# Patient Record
Sex: Male | Born: 1935
Health system: Southern US, Community
[De-identification: ages and names within clinical notes are randomized; demographics above are authoritative.]

## PROBLEM LIST (undated history)

## (undated) DIAGNOSIS — I10 Essential (primary) hypertension: Secondary | ICD-10-CM

## (undated) DIAGNOSIS — I4891 Unspecified atrial fibrillation: Secondary | ICD-10-CM

## (undated) DIAGNOSIS — Z950 Presence of cardiac pacemaker: Secondary | ICD-10-CM

## (undated) DIAGNOSIS — K635 Polyp of colon: Secondary | ICD-10-CM

## (undated) DIAGNOSIS — F419 Anxiety disorder, unspecified: Secondary | ICD-10-CM

## (undated) DIAGNOSIS — F329 Major depressive disorder, single episode, unspecified: Secondary | ICD-10-CM

## (undated) DIAGNOSIS — M1990 Unspecified osteoarthritis, unspecified site: Secondary | ICD-10-CM

## (undated) DIAGNOSIS — Z8719 Personal history of other diseases of the digestive system: Secondary | ICD-10-CM

## (undated) DIAGNOSIS — K2970 Gastritis, unspecified, without bleeding: Secondary | ICD-10-CM

## (undated) DIAGNOSIS — K625 Hemorrhage of anus and rectum: Secondary | ICD-10-CM

## (undated) DIAGNOSIS — D510 Vitamin B12 deficiency anemia due to intrinsic factor deficiency: Secondary | ICD-10-CM

## (undated) DIAGNOSIS — C4491 Basal cell carcinoma of skin, unspecified: Secondary | ICD-10-CM

## (undated) DIAGNOSIS — H269 Unspecified cataract: Secondary | ICD-10-CM

## (undated) DIAGNOSIS — F32A Depression, unspecified: Secondary | ICD-10-CM

## (undated) DIAGNOSIS — Z87442 Personal history of urinary calculi: Secondary | ICD-10-CM

## (undated) DIAGNOSIS — Z87438 Personal history of other diseases of male genital organs: Secondary | ICD-10-CM

## (undated) DIAGNOSIS — Z9289 Personal history of other medical treatment: Secondary | ICD-10-CM

## (undated) DIAGNOSIS — K219 Gastro-esophageal reflux disease without esophagitis: Secondary | ICD-10-CM

## (undated) DIAGNOSIS — I340 Nonrheumatic mitral (valve) insufficiency: Secondary | ICD-10-CM

## (undated) DIAGNOSIS — K5790 Diverticulosis of intestine, part unspecified, without perforation or abscess without bleeding: Secondary | ICD-10-CM

## (undated) DIAGNOSIS — I493 Ventricular premature depolarization: Secondary | ICD-10-CM

## (undated) DIAGNOSIS — I5032 Chronic diastolic (congestive) heart failure: Secondary | ICD-10-CM

## (undated) HISTORY — PX: EYE SURGERY: SHX253

## (undated) HISTORY — DX: Gastritis, unspecified, without bleeding: K29.70

## (undated) HISTORY — DX: Polyp of colon: K63.5

## (undated) HISTORY — DX: Personal history of other diseases of the digestive system: Z87.19

## (undated) HISTORY — DX: Basal cell carcinoma of skin, unspecified: C44.91

## (undated) HISTORY — DX: Diverticulosis of intestine, part unspecified, without perforation or abscess without bleeding: K57.90

## (undated) HISTORY — PX: INGUINAL HERNIA REPAIR: SUR1180

## (undated) HISTORY — DX: Ventricular premature depolarization: I49.3

## (undated) HISTORY — DX: Vitamin B12 deficiency anemia due to intrinsic factor deficiency: D51.0

## (undated) HISTORY — DX: Chronic diastolic (congestive) heart failure: I50.32

## (undated) HISTORY — DX: Essential (primary) hypertension: I10

## (undated) HISTORY — DX: Personal history of other diseases of male genital organs: Z87.438

---

## 1999-03-04 ENCOUNTER — Ambulatory Visit (HOSPITAL_COMMUNITY): Admission: RE | Admit: 1999-03-04 | Discharge: 1999-03-04 | Payer: Self-pay | Admitting: *Deleted

## 1999-03-09 ENCOUNTER — Ambulatory Visit (HOSPITAL_COMMUNITY): Admission: RE | Admit: 1999-03-09 | Discharge: 1999-03-09 | Payer: Self-pay | Admitting: *Deleted

## 1999-05-27 ENCOUNTER — Ambulatory Visit (HOSPITAL_COMMUNITY): Admission: RE | Admit: 1999-05-27 | Discharge: 1999-05-27 | Payer: Self-pay | Admitting: *Deleted

## 2000-03-28 ENCOUNTER — Ambulatory Visit (HOSPITAL_COMMUNITY): Admission: RE | Admit: 2000-03-28 | Discharge: 2000-03-28 | Payer: Self-pay | Admitting: *Deleted

## 2000-03-28 ENCOUNTER — Encounter: Payer: Self-pay | Admitting: *Deleted

## 2000-06-06 ENCOUNTER — Ambulatory Visit (HOSPITAL_COMMUNITY): Admission: RE | Admit: 2000-06-06 | Discharge: 2000-06-06 | Payer: Self-pay | Admitting: *Deleted

## 2000-11-24 ENCOUNTER — Ambulatory Visit (HOSPITAL_COMMUNITY): Admission: RE | Admit: 2000-11-24 | Discharge: 2000-11-24 | Payer: Self-pay | Admitting: *Deleted

## 2001-09-20 ENCOUNTER — Encounter: Admission: RE | Admit: 2001-09-20 | Discharge: 2001-09-20 | Payer: Self-pay | Admitting: General Surgery

## 2001-09-20 ENCOUNTER — Encounter: Payer: Self-pay | Admitting: General Surgery

## 2001-09-24 ENCOUNTER — Ambulatory Visit (HOSPITAL_BASED_OUTPATIENT_CLINIC_OR_DEPARTMENT_OTHER): Admission: RE | Admit: 2001-09-24 | Discharge: 2001-09-24 | Payer: Self-pay | Admitting: General Surgery

## 2010-11-16 LAB — HM COLONOSCOPY

## 2011-02-18 ENCOUNTER — Encounter: Payer: Self-pay | Admitting: Family Medicine

## 2011-02-18 ENCOUNTER — Emergency Department (HOSPITAL_COMMUNITY)
Admission: EM | Admit: 2011-02-18 | Discharge: 2011-02-19 | Disposition: A | Discharge status: Home / Self Care | Payer: Medicare Other | Attending: Emergency Medicine | Admitting: Emergency Medicine

## 2011-02-18 DIAGNOSIS — K625 Hemorrhage of anus and rectum: Secondary | ICD-10-CM | POA: Insufficient documentation

## 2011-02-18 DIAGNOSIS — R42 Dizziness and giddiness: Secondary | ICD-10-CM | POA: Insufficient documentation

## 2011-02-18 DIAGNOSIS — Z7982 Long term (current) use of aspirin: Secondary | ICD-10-CM | POA: Insufficient documentation

## 2011-02-18 DIAGNOSIS — I10 Essential (primary) hypertension: Secondary | ICD-10-CM | POA: Insufficient documentation

## 2011-02-18 DIAGNOSIS — Z79899 Other long term (current) drug therapy: Secondary | ICD-10-CM | POA: Insufficient documentation

## 2011-02-18 DIAGNOSIS — D62 Acute posthemorrhagic anemia: Secondary | ICD-10-CM | POA: Insufficient documentation

## 2011-02-18 LAB — CBC
HCT: 30.8 % — ABNORMAL LOW (ref 39.0–52.0)
Hemoglobin: 10.3 g/dL — ABNORMAL LOW (ref 13.0–17.0)
MCHC: 33.4 g/dL (ref 30.0–36.0)
MCV: 79.6 fL (ref 78.0–100.0)
RDW: 14.9 % (ref 11.5–15.5)
WBC: 11.4 10*3/uL — ABNORMAL HIGH (ref 4.0–10.5)

## 2011-02-18 LAB — BASIC METABOLIC PANEL
BUN: 17 mg/dL (ref 6–23)
Chloride: 102 mEq/L (ref 96–112)
Creatinine, Ser: 1.04 mg/dL (ref 0.50–1.35)
GFR calc Af Amer: >60 mL/min (ref >60)
Glucose, Bld: 151 mg/dL — ABNORMAL HIGH (ref 70–99)
Potassium: 3.5 mEq/L (ref 3.5–5.1)

## 2011-02-18 LAB — DIFFERENTIAL
Basophils Absolute: 0 10*3/uL (ref 0.0–0.1)
Basophils Relative: 0 % (ref 0–1)
Eosinophils Absolute: 0 10*3/uL (ref 0.0–0.7)
Eosinophils Relative: 0 % (ref 0–5)
Lymphocytes Relative: 10 % — ABNORMAL LOW (ref 12–46)
Lymphs Abs: 1.1 10*3/uL (ref 0.7–4.0)
Monocytes Absolute: 0.6 10*3/uL (ref 0.1–1.0)
Monocytes Relative: 5 % (ref 3–12)
Neutro Abs: 9.7 10*3/uL — ABNORMAL HIGH (ref 1.7–7.7)
Neutrophils Relative %: 85 % — ABNORMAL HIGH (ref 43–77)

## 2011-08-23 ENCOUNTER — Ambulatory Visit (INDEPENDENT_AMBULATORY_CARE_PROVIDER_SITE_OTHER): Payer: Medicare Other | Admitting: Ophthalmology

## 2011-09-20 ENCOUNTER — Ambulatory Visit (INDEPENDENT_AMBULATORY_CARE_PROVIDER_SITE_OTHER): Payer: Medicare Other | Admitting: Ophthalmology

## 2011-09-20 DIAGNOSIS — H3530 Unspecified macular degeneration: Secondary | ICD-10-CM

## 2011-09-20 DIAGNOSIS — H35039 Hypertensive retinopathy, unspecified eye: Secondary | ICD-10-CM

## 2011-09-20 DIAGNOSIS — I10 Essential (primary) hypertension: Secondary | ICD-10-CM

## 2011-09-20 DIAGNOSIS — H43819 Vitreous degeneration, unspecified eye: Secondary | ICD-10-CM

## 2012-02-15 ENCOUNTER — Inpatient Hospital Stay (HOSPITAL_COMMUNITY)
Admission: EM | Admit: 2012-02-15 | Discharge: 2012-02-18 | DRG: 811 | Disposition: A | Discharge status: Home / Self Care | Payer: Medicare Other | Attending: Internal Medicine | Admitting: Internal Medicine

## 2012-02-15 ENCOUNTER — Encounter (HOSPITAL_COMMUNITY): Payer: Self-pay | Admitting: *Deleted

## 2012-02-15 DIAGNOSIS — N400 Enlarged prostate without lower urinary tract symptoms: Secondary | ICD-10-CM | POA: Diagnosis present

## 2012-02-15 DIAGNOSIS — K5790 Diverticulosis of intestine, part unspecified, without perforation or abscess without bleeding: Secondary | ICD-10-CM | POA: Diagnosis present

## 2012-02-15 DIAGNOSIS — Z7982 Long term (current) use of aspirin: Secondary | ICD-10-CM

## 2012-02-15 DIAGNOSIS — K5730 Diverticulosis of large intestine without perforation or abscess without bleeding: Secondary | ICD-10-CM

## 2012-02-15 DIAGNOSIS — K5731 Diverticulosis of large intestine without perforation or abscess with bleeding: Secondary | ICD-10-CM | POA: Diagnosis present

## 2012-02-15 DIAGNOSIS — Z87438 Personal history of other diseases of male genital organs: Secondary | ICD-10-CM

## 2012-02-15 DIAGNOSIS — H547 Unspecified visual loss: Secondary | ICD-10-CM | POA: Diagnosis present

## 2012-02-15 DIAGNOSIS — K922 Gastrointestinal hemorrhage, unspecified: Secondary | ICD-10-CM | POA: Diagnosis present

## 2012-02-15 DIAGNOSIS — K635 Polyp of colon: Secondary | ICD-10-CM | POA: Diagnosis present

## 2012-02-15 DIAGNOSIS — I959 Hypotension, unspecified: Secondary | ICD-10-CM | POA: Diagnosis present

## 2012-02-15 DIAGNOSIS — K5900 Constipation, unspecified: Secondary | ICD-10-CM | POA: Diagnosis present

## 2012-02-15 DIAGNOSIS — Z85828 Personal history of other malignant neoplasm of skin: Secondary | ICD-10-CM

## 2012-02-15 DIAGNOSIS — I10 Essential (primary) hypertension: Secondary | ICD-10-CM | POA: Diagnosis present

## 2012-02-15 DIAGNOSIS — M1710 Unilateral primary osteoarthritis, unspecified knee: Secondary | ICD-10-CM | POA: Diagnosis present

## 2012-02-15 DIAGNOSIS — Z860100 Personal history of colon polyps, unspecified: Secondary | ICD-10-CM

## 2012-02-15 DIAGNOSIS — D510 Vitamin B12 deficiency anemia due to intrinsic factor deficiency: Secondary | ICD-10-CM | POA: Diagnosis present

## 2012-02-15 DIAGNOSIS — E559 Vitamin D deficiency, unspecified: Secondary | ICD-10-CM | POA: Diagnosis present

## 2012-02-15 DIAGNOSIS — D62 Acute posthemorrhagic anemia: Principal | ICD-10-CM | POA: Diagnosis present

## 2012-02-15 DIAGNOSIS — Z8719 Personal history of other diseases of the digestive system: Secondary | ICD-10-CM

## 2012-02-15 DIAGNOSIS — K625 Hemorrhage of anus and rectum: Secondary | ICD-10-CM

## 2012-02-15 HISTORY — DX: Hemorrhage of anus and rectum: K62.5

## 2012-02-15 HISTORY — DX: Unspecified osteoarthritis, unspecified site: M19.90

## 2012-02-15 LAB — CBC
HCT: 38.7 % — ABNORMAL LOW (ref 39.0–52.0)
HCT: 31.4 % — ABNORMAL LOW (ref 39.0–52.0)
Hemoglobin: 13.1 g/dL (ref 13.0–17.0)
MCH: 28.4 pg (ref 26.0–34.0)
MCHC: 33.9 g/dL (ref 30.0–36.0)
MCHC: 33.4 g/dL (ref 30.0–36.0)
MCV: 84.5 fL (ref 78.0–100.0)
Platelets: 130 10*3/uL — ABNORMAL LOW (ref 150–400)
RDW: 14 % (ref 11.5–15.5)
RDW: 14.1 % (ref 11.5–15.5)
RDW: 14.1 % (ref 11.5–15.5)
WBC: 7.5 10*3/uL (ref 4.0–10.5)

## 2012-02-15 LAB — POCT I-STAT, CHEM 8
Calcium, Ion: 1.26 mmol/L (ref 1.13–1.30)
Chloride: 101 mEq/L (ref 96–112)
Glucose, Bld: 132 mg/dL — ABNORMAL HIGH (ref 70–99)
HCT: 40 % (ref 39.0–52.0)
TCO2: 23 mmol/L (ref 0–100)

## 2012-02-15 LAB — PROTIME-INR: Prothrombin Time: 14.7 seconds (ref 11.6–15.2)

## 2012-02-15 MED ORDER — ACETAMINOPHEN 650 MG RE SUPP: 650.0000 mg | Freq: Four times a day (QID) | RECTAL | Status: DC | PRN

## 2012-02-15 MED ORDER — AMLODIPINE BESYLATE 10 MG PO TABS
10.0000 mg | ORAL_TABLET | Freq: Every day | ORAL | Status: DC
Start: 2012-02-16 — End: 2012-02-16
  Filled 2012-02-15: qty 1

## 2012-02-15 MED ORDER — SODIUM CHLORIDE 0.9 % IJ SOLN
3.0000 mL | Freq: Two times a day (BID) | INTRAMUSCULAR | Status: DC
  Administered 2012-02-15 – 2012-02-18 (×2): 3 mL via INTRAVENOUS

## 2012-02-15 MED ORDER — ACETAMINOPHEN 325 MG PO TABS
650.0000 mg | ORAL_TABLET | Freq: Four times a day (QID) | ORAL | Status: DC | PRN
  Administered 2012-02-16: 650 mg via ORAL
  Filled 2012-02-15: qty 2

## 2012-02-15 MED ORDER — ONDANSETRON HCL 4 MG/2ML IJ SOLN: 4.0000 mg | Freq: Four times a day (QID) | INTRAMUSCULAR | Status: DC | PRN

## 2012-02-15 MED ORDER — ALUM & MAG HYDROXIDE-SIMETH 200-200-20 MG/5ML PO SUSP: 30.0000 mL | Freq: Four times a day (QID) | ORAL | Status: DC | PRN

## 2012-02-15 MED ORDER — DOXAZOSIN MESYLATE 4 MG PO TABS
4.0000 mg | ORAL_TABLET | Freq: Every day | ORAL | Status: DC
  Administered 2012-02-15 – 2012-02-17 (×3): 4 mg via ORAL
  Filled 2012-02-15 (×5): qty 1

## 2012-02-15 MED ORDER — SODIUM CHLORIDE 0.9 % IV BOLUS (SEPSIS)
500.0000 mL | Freq: Once | INTRAVENOUS | Status: AC
  Administered 2012-02-15: 16:00:00 via INTRAVENOUS

## 2012-02-15 MED ORDER — ONDANSETRON HCL 4 MG PO TABS: 4.0000 mg | ORAL_TABLET | Freq: Four times a day (QID) | ORAL | Status: DC | PRN

## 2012-02-15 MED ORDER — SODIUM CHLORIDE 0.9 % IV SOLN
INTRAVENOUS | Status: DC
  Administered 2012-02-15 – 2012-02-16 (×3): via INTRAVENOUS

## 2012-02-15 MED ORDER — ONDANSETRON HCL 4 MG/2ML IJ SOLN: 4.0000 mg | Freq: Three times a day (TID) | INTRAMUSCULAR | Status: DC | PRN

## 2012-02-15 MED ORDER — SODIUM CHLORIDE 0.9 % IV BOLUS (SEPSIS)
500.0000 mL | Freq: Once | INTRAVENOUS | Status: AC
  Administered 2012-02-15: 500 mL via INTRAVENOUS

## 2012-02-15 NOTE — ED Notes (Signed)
Pt had 1 episode bloody stool. Reports was dark blood

## 2012-02-15 NOTE — ED Provider Notes (Signed)
History     CSN: [id number]  Arrival date & time 02/15/12  4098   First MD Initiated Contact with Patient 02/15/12 1044      Chief Complaint  Patient presents with  . Rectal Bleeding    (Consider location/radiation/quality/duration/timing/severity/associated sxs/prior treatment) HPI Comments: Austin Gray. Presents with  Bright red blood in his stool multiple times since he woke today at 6 am.  He denies pain,  Nausea, vomiting,  Acid reflux or history of stomach ulcer/gastritis or GERD.  He does report having a history of colon polyps,  Last colonoscopy was last summer and believes he has internal hemorrhoids as well.  He denies weakness or dizziness,  No fatigue at this time.  He has had approximately 6 bloody stools with a small amount of semi formed stool,  The last occuring just after arrival here.  He is on no blood thinners but does take an aspirin 81 mg daily.  Patient is a 78 y.o. male presenting with hematochezia. The history is provided by the patient.  Rectal Bleeding  Pertinent negatives include no fever, no abdominal pain, no nausea, no rectal pain, no vomiting, no chest pain, no headaches and no rash.    Past Medical History  Diagnosis Date  . Hypertension   . Pernicious anemia   . Inguinal hernia   . History of BPH   . Visual impairment   . Hemorrhoids   . Diverticulosis   . Gastritis   . Basal cell carcinoma   . Vitamin d deficiency   . Colon polyps     Past Surgical History  Procedure Date  . Hernia repair     History reviewed. No pertinent family history.  History  Substance Use Topics  . Smoking status: Not on file  . Smokeless tobacco: Not on file  . Alcohol Use: Not on file      Review of Systems  Constitutional: Negative for fever and fatigue.  HENT: Negative for congestion, sore throat and neck pain.   Eyes: Negative.   Respiratory: Negative for chest tightness and shortness of breath.   Cardiovascular: Negative for chest pain.   Gastrointestinal: Positive for blood in stool and hematochezia. Negative for nausea, vomiting, abdominal pain, abdominal distention and rectal pain.  Genitourinary: Negative.   Musculoskeletal: Negative for joint swelling and arthralgias.  Skin: Negative.  Negative for rash and wound.  Neurological: Negative for dizziness, weakness, light-headedness, numbness and headaches.  Hematological: Negative.   Psychiatric/Behavioral: Negative.     Allergies  Review of patient's allergies indicates no known allergies.  Home Medications   Current Outpatient Rx  Name Route Sig Dispense Refill  . AMLODIPINE BESYLATE 10 MG PO TABS Oral Take 10 mg by mouth daily.      . ASPIRIN 81 MG PO TABS Oral Take 81 mg by mouth daily.      Marland Kitchen BENAZEPRIL HCL 40 MG PO TABS Oral Take 40 mg by mouth daily.      Marland Kitchen DOXAZOSIN MESYLATE 4 MG PO TABS Oral Take 4 mg by mouth at bedtime.    Marland Kitchen HYDROCHLOROTHIAZIDE 25 MG PO TABS Oral Take 25 mg by mouth daily.      . ADULT MULTIVITAMIN W/MINERALS CH Oral Take 1 tablet by mouth daily.    . OCUVITE-LUTEIN PO CAPS Oral Take 1 capsule by mouth daily.      BP 137/63  Pulse 64  Temp 97.2 F (36.2 C) (Oral)  Resp 16  SpO2 97%  Physical Exam  Nursing note and vitals reviewed. Constitutional: He appears well-developed and well-nourished.  HENT:  Head: Normocephalic and atraumatic.  Eyes: Conjunctivae are normal.  Neck: Normal range of motion.  Cardiovascular: Normal rate, regular rhythm, normal heart sounds and intact distal pulses.   Pulmonary/Chest: Effort normal and breath sounds normal. He has no wheezes.  Abdominal: Soft. Bowel sounds are normal. He exhibits no distension. There is no hepatosplenomegaly. There is no tenderness. There is no rebound, no guarding and no CVA tenderness.  Genitourinary: Rectal exam shows no mass and no tenderness.       No appreciable hemorrhoid,  Hemoccult negative.  Musculoskeletal: Normal range of motion.  Neurological: He is alert.    Skin: Skin is warm and dry.  Psychiatric: He has a normal mood and affect.    ED Course  Procedures (including critical care time)  Labs Reviewed  CBC - Abnormal; Notable for the following:    WBC 12.1 (*)     HCT 38.7 (*)     All other components within normal limits  POCT I-STAT, CHEM 8 - Abnormal; Notable for the following:    Glucose, Bld 132 (*)     All other components within normal limits  TYPE AND SCREEN  ABO/RH   Pt is grossly hemoccult positive  1. GI bleeding       MDM  Spoke with Algis Downs with [facility].  Accepts pt.  With temp admit orders to Team 10.  Pt stable at this time.    Spoke with Trixie Dredge, PA-C - will accept pt in cdu pending bed placement.        Burgess Amor, PA 02/15/12 1347  Burgess Amor, PA 02/15/12 1358

## 2012-02-15 NOTE — ED Notes (Signed)
C/o rectal bleeding onset this am, 5 episodes. Reports bright red blood. Denies pain. States stool with each episode.  No voiced complaints presently.

## 2012-02-15 NOTE — ED Provider Notes (Signed)
Medical screening examination/treatment/procedure(s) were conducted as a shared visit with non-physician practitioner(s) and myself.  I personally evaluated the patient during the encounter 1:19 PM  Elderly man with bright red rectal bleeding, mild LLQ tenderness on exam, and mild anemia on lab tests. Recommend admission and GI Consult.       Carleene Cooper III, MD 02/15/12 2033

## 2012-02-15 NOTE — ED Notes (Signed)
Admitting MD at bedside.

## 2012-02-15 NOTE — ED Notes (Signed)
Pt. oob to the bathroom, gait steady.  

## 2012-02-15 NOTE — ED Notes (Signed)
To ED for eval of rectal bleeding since this am. Pt states each time he uses the bathroom there is bright red blood in the toilet. Skin w/d, resp e/u. Appears in nad. No abd pain

## 2012-02-15 NOTE — H&P (Signed)
I have personally seen and examined [name] I agree with a/p and PE as per M. York, PA note Interval history Patient had a large bloody BMs - with BP drop and dizziness - vagal vs volume depletion . Plan to admit his to stepdown and transfuse one unit of PRBCs Bolus of 500cc in the ED corrected the hypotension. Lonia Blood [id number]

## 2012-02-15 NOTE — H&P (Signed)
[facility] History and Physical  Austin Gray. UJW:[id number] DOB: [birth year] DOA: 02/15/2012  Referring physician: Burgess Amor, PA PCP: Leo Grosser, MD  Gastroenterologist:  Dr. Jeani Hawking  Chief Complaint: BRBPR HPI:  78 year old Caucasian male with history of colon polyps and diverticulosis presents to the ED with multiple episodes of bright red blood per rectum that started this morning.  Including the most recent episode in the emergency department, [name] has had 7 episodes of bright red blood per rectum today.  He reports mild constipation since last August for which he drinks apple juice or prune juice each morning. He denies any abdominal pain and reports that his appetite has been good. He takes an 81 mg aspirin daily but denies any NSAID use other than that. His last colonoscopy was approximately one year ago. Dr. Elnoria Howard observed diverticulosis and removed 2 polyps.  He was sent to [facility] for a special procedure to remove the third polyp. After the third polyp was removed he did have some bright red blood per rectum and anemia, but it resolved over the course of the next couple of months.    Review of Systems:  Pertinent positives include:  Mild constipation, +81 mg ASA  Pertinent negatives include:  Chest pain, recent illness, difficulty breathing, hematuria, easy bruising, anorexia, decreased level of energy.  All other systems reviewed and found to be negative.   Past Medical History  Diagnosis Date  . Hypertension   . Pernicious anemia   . Inguinal hernia   . History of BPH   . Visual impairment   . Hemorrhoids   . Diverticulosis   . Gastritis   . Basal cell carcinoma   . Vitamin d deficiency   . Colon polyps    Past Surgical History  Procedure Date  . Hernia repair    Social History:  does not have a smoking history on file. He does not have any smokeless tobacco history on file. His alcohol and drug histories not on file.  No Known  Allergies  History reviewed. No pertinent family history.   Prior to Admission medications   Medication Sig Start Date End Date Taking? Authorizing Provider  amLODipine (NORVASC) 10 MG tablet Take 10 mg by mouth daily.     Yes Historical Provider, MD  aspirin 81 MG tablet Take 81 mg by mouth daily.     Yes Historical Provider, MD  benazepril (LOTENSIN) 40 MG tablet Take 40 mg by mouth daily.     Yes Historical Provider, MD  doxazosin (CARDURA) 4 MG tablet Take 4 mg by mouth at bedtime.   Yes Historical Provider, MD  hydrochlorothiazide 25 MG tablet Take 25 mg by mouth daily.     Yes Historical Provider, MD  Multiple Vitamin (MULTIVITAMIN WITH MINERALS) TABS Take 1 tablet by mouth daily.   Yes Historical Provider, MD  multivitamin-lutein (OCUVITE-LUTEIN) CAPS Take 1 capsule by mouth daily.   Yes Historical Provider, MD   Physical Exam: Filed Vitals:   02/15/12 1140 02/15/12 1230 02/15/12 1315 02/15/12 1450  BP: 97/60 116/53 137/63 111/52  Pulse: 84 79 64 63  Temp:    98.7 F (37.1 C)  TempSrc:    Oral  Resp: 15 16 16 28   SpO2: 97% 97% 97% 97%     General:  No Acute Distress, Awake, Non toxic  Eyes: Pupils equal, round and reactive to light  ENT: Oropharynx shows no edema or exudates, nose with out discharge.  Poor dentition  Neck:  Supple  Cardiovascular:  Regular rate and rhythm, no murmurs, rubs or gallops, no lower extremity edema  Respiratory: Clear to auscultation, no accessory muscle movment  Abdomen: Soft, minimally tender to palpation in the LLQ, non distended, positive bowel sounds, no obvious masses palpated.  Skin: no rashes, bruises, or lesions  Psychiatric: Alert and Orientated, Cooperative, moderately groomed, halitosis  Neurologic: Cranial Nerves 2-12 are grossly in tact.  Exam non focal.  Labs on Admission:  Basic Metabolic Panel:  Lab 02/15/12 9604  NA 138  K 4.1  CL 101  CO2 --  GLUCOSE 132*  BUN 15  CREATININE 1.10  CALCIUM --  MG --  PHOS  --   CBC:  Lab 02/15/12 1057 02/15/12 1047  WBC -- 12.1*  NEUTROABS -- --  HGB 13.6 13.1  HCT 40.0 38.7*  MCV -- 83.8  PLT -- 156    Radiological Exams on Admission: No results found.  EKG: pending  Assessment/Plan Principal Problem:  *GI bleed Active Problems:  Hypertension  Pernicious anemia  History of BPH  Diverticulosis   Lower Gastrointestinal Bleeding.  Likely diverticular in nature.  Patient is currently stable.  Will check CBC q 8 hours and monitor in a tele bed.  Hold 81 mg ASA.  Type and screen completed.  Clear liquids with 2 doses of miralax.  Dr. Elnoria Howard has been consulted.  Hypertension.  BP currently stable.  Will continue norvasc and cardura, but hold HCTZ, benazepril.  Monitor.  Concerned for hypotension if he continues bleeding.  History of BPH.  Continue Cardura.  Vision impairment.     Code Status:  Full Code (per patient "try once") Family Communication: Spoke directly with patient, no family present. Disposition Plan: Patient from home.  Lives alone and cares for his 2 grand daughters who are 66, 65, 2 yo.  Plan to return home when medically appropriate.  Algis Downs, PA-C [facility] Pager: [phone]   If 7PM-7AM, please contact night-coverage [email or web address] Password [facility] 02/15/2012, 2:58 PM

## 2012-02-15 NOTE — ED Notes (Signed)
Reported hypotension to [facility].   Orders received to give a bolus of 500ccs of NACL.  Admitting MD will be down to see pt.

## 2012-02-15 NOTE — Consult Note (Signed)
Reason for Consult: Hematochezia Referring Physician: [facility]  Shamel E Dane. HPI: This is a 78 year old gentleman with a PMH of colonic polyps and diverticula who was admitted for hematochezia.  The bleeding started early this morning and as a result he presented to the ER.  His HGB was initially noted to be normal, but with the persistent hematochezia his HGB dropped down into the 10 range.  No complaints of abdominal pain.  His blood pressure was noted to drop and he was then admitted to the step down unit.  Past Medical History  Diagnosis Date  . Hypertension   . History of BPH   . Visual impairment   . Hemorrhoids   . Diverticulosis   . Gastritis   . Basal cell carcinoma   . Vitamin d deficiency   . Colon polyps   . Pernicious anemia   . Exertional dyspnea     "whenever I'm mowing or something"  . Rectal bleeding 02/15/12  . Arthritis     "little bit; left knee"    Past Surgical History  Procedure Date  . Inguinal hernia repair 1958; ~ 2002    left; right    History reviewed. No pertinent family history.  Social History:  reports that he has never smoked. His smokeless tobacco use includes Snuff. He reports that he does not drink alcohol or use illicit drugs.  Allergies: No Known Allergies  Medications:  Scheduled:   . amLODipine  10 mg Oral Daily  . doxazosin  4 mg Oral QHS  . sodium chloride  500 mL Intravenous Once  . sodium chloride  500 mL Intravenous Once  . sodium chloride  3 mL Intravenous Q12H   Continuous:   . sodium chloride 100 mL/hr at 02/15/12 1558    Results for orders placed during the hospital encounter of 02/15/12 (from the past 24 hour(s))  CBC     Status: Abnormal   Collection Time   02/15/12 10:47 AM      Component Value Range   WBC 12.1 (*) 4.0 - 10.5 K/uL   RBC 4.62  4.22 - 5.81 MIL/uL   Hemoglobin 13.1  13.0 - 17.0 g/dL   HCT 16.1 (*) 09.6 - 04.5 %   MCV 83.8  78.0 - 100.0 fL   MCH 28.4  26.0 - 34.0 pg   MCHC  33.9  30.0 - 36.0 g/dL   RDW 40.9  81.1 - 91.4 %   Platelets 156  150 - 400 K/uL  POCT I-STAT, CHEM 8     Status: Abnormal   Collection Time   02/15/12 10:57 AM      Component Value Range   Sodium 138  135 - 145 mEq/L   Potassium 4.1  3.5 - 5.1 mEq/L   Chloride 101  96 - 112 mEq/L   BUN 15  6 - 23 mg/dL   Creatinine, Ser 7.82  0.50 - 1.35 mg/dL   Glucose, Bld 956 (*) 70 - 99 mg/dL   Calcium, Ion 2.13  0.86 - 1.30 mmol/L   TCO2 23  0 - 100 mmol/L   Hemoglobin 13.6  13.0 - 17.0 g/dL   HCT 57.8  46.9 - 62.9 %  TYPE AND SCREEN     Status: Normal (Preliminary result)   Collection Time   02/15/12 11:35 AM      Component Value Range   ABO/RH(D) O POS     Antibody Screen NEG     Sample Expiration 02/18/2012  Unit Number [id number]     Blood Component Type RED CELLS,LR     Unit division 00     Status of Unit ISSUED     Transfusion Status OK TO TRANSFUSE     Crossmatch Result Compatible    ABO/RH     Status: Normal   Collection Time   02/15/12 11:35 AM      Component Value Range   ABO/RH(D) O POS    CBC     Status: Abnormal   Collection Time   02/15/12  2:53 PM      Component Value Range   WBC 10.2  4.0 - 10.5 K/uL   RBC 3.74 (*) 4.22 - 5.81 MIL/uL   Hemoglobin 10.5 (*) 13.0 - 17.0 g/dL   HCT 82.9 (*) 56.2 - 13.0 %   MCV 84.0  78.0 - 100.0 fL   MCH 28.1  26.0 - 34.0 pg   MCHC 33.4  30.0 - 36.0 g/dL   RDW 86.5  78.4 - 69.6 %   Platelets 142 (*) 150 - 400 K/uL  PREPARE RBC (CROSSMATCH)     Status: Normal   Collection Time   02/15/12  4:15 PM      Component Value Range   Order Confirmation ORDER PROCESSED BY BLOOD BANK       No results found.  ROS:  As stated above in the HPI otherwise negative.  Blood pressure 128/51, pulse 62, temperature 97.9 F (36.6 C), temperature source Oral, resp. rate 26, SpO2 100.00%.    PE: Gen: NAD, Alert and Oriented HEENT:  [place]/AT, EOMI Neck: Supple, no LAD Lungs: CTA Bilaterally CV: RRR without M/G/R ABM: Soft, NTND, +BS Ext: No  C/C/E  Assessment/Plan: 1) Diverticular bleed. 2) Anemia   The patient's clinical presentation is consistent with a diverticular bleed.  If I recall correctly his diverticula is left-sided.  Currently he is stable, but I agree with the close monitoring.  He had a colonoscopy as recently as last year and he did require further polypectomy at [facility] for a flat polyp.  Plan: 1) Monitor HGB. 2) Transfuse as necessary. 3) If bleeding continues a surgical consultation will be warranted.  At that point it will also be prudent to order a bleeding scan.  For right now I think we can monitor his clinical status.  Teighlor Korson D 02/15/2012, 5:55 PM

## 2012-02-15 NOTE — ED Notes (Signed)
Dr. Lavera Guise at the bedside. Pt. Received 500 cc bolus of Nacl.  Pt. Drinking clear liquid diet.

## 2012-02-15 NOTE — Progress Notes (Signed)
1:19 PM Elderly man with bright red rectal bleeding, mild LLQ tenderness on exam, and mild anemia on lab tests.  Recommend admission and GI Consult.

## 2012-02-16 ENCOUNTER — Inpatient Hospital Stay (HOSPITAL_COMMUNITY): Payer: Medicare Other

## 2012-02-16 DIAGNOSIS — I10 Essential (primary) hypertension: Secondary | ICD-10-CM

## 2012-02-16 DIAGNOSIS — D62 Acute posthemorrhagic anemia: Principal | ICD-10-CM

## 2012-02-16 LAB — BASIC METABOLIC PANEL
Calcium: 8.4 mg/dL (ref 8.4–10.5)
Chloride: 105 mEq/L (ref 96–112)
Creatinine, Ser: 0.96 mg/dL (ref 0.50–1.35)
GFR calc Af Amer: >90 mL/min (ref >90)

## 2012-02-16 LAB — CBC
MCH: 28.6 pg (ref 26.0–34.0)
MCHC: 33.9 g/dL (ref 30.0–36.0)
MCV: 84.2 fL (ref 78.0–100.0)
Platelets: 127 10*3/uL — ABNORMAL LOW (ref 150–400)
RDW: 14.2 % (ref 11.5–15.5)

## 2012-02-16 LAB — PREPARE RBC (CROSSMATCH)

## 2012-02-16 MED ORDER — TECHNETIUM TC 99M-LABELED RED BLOOD CELLS IV KIT
25.0000 | PACK | Freq: Once | INTRAVENOUS | Status: AC | PRN
  Administered 2012-02-16: 25 via INTRAVENOUS

## 2012-02-16 MED ORDER — AMLODIPINE BESYLATE 5 MG PO TABS
5.0000 mg | ORAL_TABLET | Freq: Every day | ORAL | Status: DC
  Filled 2012-02-16: qty 1

## 2012-02-16 NOTE — Progress Notes (Signed)
Pt to nuclear med

## 2012-02-16 NOTE — Progress Notes (Signed)
[facility] Progress Note  TEAM 1 - Stepdown/ICU TEAM   Austin Gray. [id number]:[id number] DOB: [birth year] DOA: 02/15/2012 PCP: Leo Grosser, MD  Brief narrative: 78 year old male patient with history of colon polyps and diverticulosis presented to the ER with multiple episodes of red blood (started on the morning of admission. He's had 7 episodes of this since onset. No significant end-stage usage-only 81 mg of aspirin daily. Last colonoscopy one year ago by Dr. Elnoria Howard at which point he was found to have diverticulosis as well as 2 polyps which were removed. He was sent to wait for [facility] for a procedure to remove a third polyp. The patient does note that after the third polyp was removed he did have some bright red blood (and some anemia. Because of ongoing bleeding the patient was admitted to the step down unit for further monitoring and evaluation.  Assessment/Plan: Principal Problem:  *Lower GI bleeding *Patient still having bright red blood present today and according to the nurse seemed somewhat heavy therefore we have ordered a nuclear medicine bleeding scan *Dr. Elnoria Howard has evaluated the patient after we examined him and felt the bleeding was actually less as compared to bleeding at time of presentation *If bleeding scan unrevealing and bleeding persists patient may need to undergo endoscopic evaluation  Active Problems:  Acute blood loss anemia *Patient has received one unit of packed red blood cells since admission *Hemoglobin has decreased from 13 to 9.6 and since he is having continued bleeding we will go head transfuse 2 more units of packed red blood cells today and repeat CBC after transfusion complete.   Diverticulosis/Colon polyps *Likely etiology to patient's bleeding although he also reports he's had issues with hemorrhoids in the past   Hypotension *Relative hypotension at presentation and has improved with administration of blood  products and holding and/or adjusting antihypertensive medication doses   Hypertension *ACE inhibitor held at time of admission *Will continue Cardura and decrease Norvasc from 10 to 5 mg   History of BPH *Rationale for dosing Cardura at hour of sleep    DVT prophylaxis: SCDs since actively bleeding Code Status: Full Family Communication: Communicated directly with patient as well as his daughter who is at the bedside Disposition Plan: Remain in step down  Consultants: Dr. Audley Hose  Procedures: None  Antibiotics: None  HPI/Subjective: Patient endorses continues with painless lower GI bleeding. States remotely had GI bleeding for several days and this resolved independently of any treatment. States has not been constipated and stools are actually lose. Take stool softener and produce daily   Objective: Blood pressure 140/67, pulse 70, temperature 98.2 F (36.8 C), temperature source Oral, resp. rate 21, height 5\' 10"  (1.778 m), weight 105.5 kg (232 lb 9.4 oz), SpO2 96.00%.  Intake/Output Summary (Last 24 hours) at 02/16/12 1602 Last data filed at 02/16/12 1319  Gross per 24 hour  Intake   3090 ml  Output   1475 ml  Net   1615 ml     Exam: General: No acute respiratory distress Lungs: Clear to auscultation bilaterally without wheezes or crackles, no cyanosis Cardiovascular: Regular rate and rhythm without murmur gallop or rub normal S1 and S2, no edema Abdomen: Nontender, nondistended, soft, bowel sounds positive, no rebound, no ascites, no appreciable mass Musculoskeletal: No significant cyanosis, clubbing of extremities Neurological: Alert and oriented x3, moves all extremities x4, exam otherwise non-focal  Data Reviewed: Basic Metabolic Panel:  Lab 02/16/12 5784 02/15/12 1057  NA 138 138  K 3.9 4.1  CL 105 101  CO2 26 --  GLUCOSE 114* 132*  BUN 18 15  CREATININE 0.96 1.10  CALCIUM 8.4 --  MG -- --  PHOS -- --   Liver Function Tests: No results found for  this basename: AST:5,ALT:5,ALKPHOS:5,BILITOT:5,PROT:5,ALBUMIN:5 in the last 168 hours No results found for this basename: LIPASE:5,AMYLASE:5 in the last 168 hours No results found for this basename: AMMONIA:5 in the last 168 hours CBC:  Lab 02/16/12 0427 02/15/12 2132 02/15/12 1453 02/15/12 1057 02/15/12 1047  WBC 7.5 7.5 10.2 -- 12.1*  NEUTROABS -- -- -- -- --  HGB 9.4* 10.3* 10.5* 13.6 13.1  HCT 27.7* 30.6* 31.4* 40.0 38.7*  MCV 84.2 84.5 84.0 -- 83.8  PLT 127* 130* 142* -- 156   Cardiac Enzymes: No results found for this basename: CKTOTAL:5,CKMB:5,CKMBINDEX:5,TROPONINI:5 in the last 168 hours BNP (last 3 results) No results found for this basename: PROBNP:3 in the last 8760 hours CBG: No results found for this basename: GLUCAP:5 in the last 168 hours  Recent Results (from the past 240 hour(s))  MRSA PCR SCREENING     Status: Normal   Collection Time   02/15/12  5:24 PM      Component Value Range Status Comment   MRSA by PCR NEGATIVE  NEGATIVE Final      Studies:  Recent x-ray studies have been reviewed in detail by the Attending Physician  Scheduled Meds:  Reviewed in detail by the Attending Physician   Junious Silk, ANP [facility] Office  [phone] Pager [phone]  On-Call/Text Page:      [email or web address]      password [email or web address]  If 7PM-7AM, please contact night-coverage [email or web address] Password [email or web address] 02/16/2012, 4:02 PM   LOS: 1 day   I have examined the patient and reviewed the chart. I have modified the above note and agree with it.   Calvert Cantor, MD [phone]

## 2012-02-16 NOTE — Progress Notes (Signed)
Utilization review completed.  

## 2012-02-16 NOTE — Progress Notes (Signed)
Subjective: The bleeding has markedly decreased.  Feeling better.  Objective: Vital signs in last 24 hours: Temp:  [97.6 F (36.4 C)-98.7 F (37.1 C)] 98.2 F (36.8 C) (08/01 1119) Pulse Rate:  [59-84] 70  (08/01 1119) Resp:  [15-28] 21  (08/01 1119) BP: (79-150)/(26-68) 140/67 mmHg (08/01 1119) SpO2:  [95 %-100 %] 96 % (08/01 0734) Weight:  [105.5 kg (232 lb 9.4 oz)] 105.5 kg (232 lb 9.4 oz) (07/31 1720) Last BM Date: 02/15/12  Intake/Output from previous day: 07/31 0701 - 08/01 0700 In: 2990 [P.O.:240; I.V.:2500; Blood:250] Out: 50 [Urine:50] Intake/Output this shift: Total I/O In: 100 [I.V.:100] Out: 950 [Urine:950]  General appearance: alert and no distress GI: soft, non-tender; bowel sounds normal; no masses,  no organomegaly  Lab Results:  Basename 02/16/12 0427 02/15/12 2132 02/15/12 1453  WBC 7.5 7.5 10.2  HGB 9.4* 10.3* 10.5*  HCT 27.7* 30.6* 31.4*  PLT 127* 130* 142*   BMET  Basename 02/16/12 0427 02/15/12 1057  NA 138 138  K 3.9 4.1  CL 105 101  CO2 26 --  GLUCOSE 114* 132*  BUN 18 15  CREATININE 0.96 1.10  CALCIUM 8.4 --   LFT No results found for this basename: PROT,ALBUMIN,AST,ALT,ALKPHOS,BILITOT,BILIDIR,IBILI in the last 72 hours PT/INR  Basename 02/15/12 2132  LABPROT 14.7  INR 1.13   Hepatitis Panel No results found for this basename: HEPBSAG,HCVAB,HEPAIGM,HEPBIGM in the last 72 hours C-Diff No results found for this basename: CDIFFTOX:3 in the last 72 hours Fecal Lactopherrin No results found for this basename: FECLLACTOFRN in the last 72 hours  Studies/Results: No results found.  Medications:  Scheduled:   . amLODipine  5 mg Oral Daily  . doxazosin  4 mg Oral QHS  . sodium chloride  500 mL Intravenous Once  . sodium chloride  500 mL Intravenous Once  . sodium chloride  3 mL Intravenous Q12H  . DISCONTD: amLODipine  10 mg Oral Daily   Continuous:   . sodium chloride 100 mL/hr at 02/16/12 0023    Assessment/Plan: 1)  Diverticular bleed.   Clinically he is well.  He is receiving his second unit of PRBC.  A bleeding scan is pending at this time.  I do not necessarily think he requires it at this time, but it is not unreasonable.    Plan: 1) Continue to follow HGB. 2) Transfuse as necessary. 3) Await bleeding scan results.  LOS: 1 day   Dalon Reichart D 02/16/2012, 11:39 AM

## 2012-02-16 NOTE — Progress Notes (Signed)
Pt back from nuclear med. VSS.

## 2012-02-17 DIAGNOSIS — Z87898 Personal history of other specified conditions: Secondary | ICD-10-CM

## 2012-02-17 LAB — CBC
HCT: 32.5 % — ABNORMAL LOW (ref 39.0–52.0)
Hemoglobin: 11.5 g/dL — ABNORMAL LOW (ref 13.0–17.0)
MCH: 28 pg (ref 26.0–34.0)
MCV: 83.8 fL (ref 78.0–100.0)
Platelets: 126 10*3/uL — ABNORMAL LOW (ref 150–400)
Platelets: 139 10*3/uL — ABNORMAL LOW (ref 150–400)
RBC: 3.88 MIL/uL — ABNORMAL LOW (ref 4.22–5.81)
RBC: 4.1 MIL/uL — ABNORMAL LOW (ref 4.22–5.81)
RDW: 14.1 % (ref 11.5–15.5)
WBC: 8.5 10*3/uL (ref 4.0–10.5)
WBC: 7.6 10*3/uL (ref 4.0–10.5)

## 2012-02-17 LAB — TYPE AND SCREEN
Unit division: 0
Unit division: 0

## 2012-02-17 MED ORDER — AMLODIPINE BESYLATE 10 MG PO TABS
10.0000 mg | ORAL_TABLET | Freq: Every day | ORAL | Status: DC
  Administered 2012-02-17 – 2012-02-18 (×2): 10 mg via ORAL
  Filled 2012-02-17 (×2): qty 1

## 2012-02-17 NOTE — Progress Notes (Signed)
[facility] Progress Note [place] TEAM 1 - Stepdown/ICU TEAM   Austin Gray. RUE:[id number] DOB: 05-14-1932 DOA: 02/15/2012 PCP: Leo Grosser, MD  Brief narrative: 78 year old male patient with history of colon polyps and diverticulosis presented to the ER with multiple episodes of red blood (started on the morning of admission. He's had 7 episodes of this since onset. No significant end-stage usage-only 81 mg of aspirin daily. Last colonoscopy one year ago by Dr. Elnoria Howard at which point he was found to have diverticulosis as well as 2 polyps which were removed. He was sent to wait for [facility] for a procedure to remove a third polyp. The patient does note that after the third polyp was removed he did have some bright red blood (and some anemia. Because of ongoing bleeding the patient was admitted to the step down unit for further monitoring and evaluation.  Assessment/Plan: Principal Problem:  *Lower GI bleeding *As of this morning all frank red blood has stopped per rectum and stools are becoming more brown *Dr. Elnoria Howard has his recommended evaluating for another 24 hours and no further bleeding and hemoglobin stable can discharge on 02/18/2012 *If bleeding scan was negative *Advance diet  Active Problems:  Acute blood loss anemia *Patient has received one unit of packed red blood cells since admission *Hemoglobin decreased from 13 to 9.6 on 02/16/2012 and he was subsequently transfused 2 units of packed red blood cells and as of this morning his hemoglobin is 11.  *Repeat CBC in a.m.   Diverticulosis/Colon polyps *Likely etiology to patient's bleeding although he also reports he's had issues with hemorrhoids in the past   Hypotension *Relative hypotension at presentation and has improved with administration of blood products and holding and/or adjusting antihypertensive medication doses   Hypertension *ACE inhibitor held at time of admission *Will  continue Cardura and since blood pressure has rebounded double go ahead and increase back to home dosing of Norvasc at 10 mg   History of BPH *Rationale for dosing Cardura at hour of sleep    DVT prophylaxis: SCDs since actively bleeding Code Status: Full Family Communication: Communicated directly with patient as well as his daughter who is at the bedside Disposition Plan: Transfer to medical floor but keep on team 1 since anticipate discharge in am  Consultants: Dr. Elnoria Howard  Procedures: None  Antibiotics: None  HPI/Subjective: No further bleeding as noted above. No abdominal pain. Hungry and wants diet advance   Objective: Blood pressure 137/58, pulse 55, temperature 98.3 F (36.8 C), temperature source Oral, resp. rate 16, height 5\' 10"  (1.778 m), weight 105.5 kg (232 lb 9.4 oz), SpO2 97.00%.  Intake/Output Summary (Last 24 hours) at 02/17/12 1034 Last data filed at 02/17/12 0915  Gross per 24 hour  Intake     40 ml  Output   2527 ml  Net  -2487 ml     Exam: General: No acute respiratory distress Lungs: Clear to auscultation bilaterally without wheezes or crackles, no cyanosis Cardiovascular: Regular rate and rhythm without murmur gallop or rub normal S1 and S2, no edema Abdomen: Nontender, nondistended, soft, bowel sounds positive, no rebound, no ascites, no appreciable mass Musculoskeletal: No significant cyanosis, clubbing of extremities Neurological: Alert and oriented x3, moves all extremities x4, exam otherwise non-focal  Data Reviewed: Basic Metabolic Panel:  Lab 02/16/12 1478 02/15/12 1057  NA 138 138  K 3.9 4.1  CL 105 101  CO2 26 --  GLUCOSE 114* 132*  BUN 18 15  CREATININE 0.96 1.10  CALCIUM 8.4 --  MG -- --  PHOS -- --   Liver Function Tests: No results found for this basename: AST:5,ALT:5,ALKPHOS:5,BILITOT:5,PROT:5,ALBUMIN:5 in the last 168 hours No results found for this basename: LIPASE:5,AMYLASE:5 in the last 168 hours No results found  for this basename: AMMONIA:5 in the last 168 hours CBC:  Lab 02/17/12 0515 02/16/12 2318 02/16/12 0427 02/15/12 2132 02/15/12 1453  WBC 7.6 8.5 7.5 7.5 10.2  NEUTROABS -- -- -- -- --  HGB 11.5* 10.9* 9.4* 10.3* 10.5*  HCT 34.5* 32.5* 27.7* 30.6* 31.4*  MCV 84.1 83.8 84.2 84.5 84.0  PLT 139* 126* 127* 130* 142*   Cardiac Enzymes: No results found for this basename: CKTOTAL:5,CKMB:5,CKMBINDEX:5,TROPONINI:5 in the last 168 hours BNP (last 3 results) No results found for this basename: PROBNP:3 in the last 8760 hours CBG: No results found for this basename: GLUCAP:5 in the last 168 hours  Recent Results (from the past 240 hour(s))  MRSA PCR SCREENING     Status: Normal   Collection Time   02/15/12  5:24 PM      Component Value Range Status Comment   MRSA by PCR NEGATIVE  NEGATIVE Final      Studies:  Recent x-ray studies have been reviewed in detail by the Attending Physician  Scheduled Meds:  Reviewed in detail by the Attending Physician   Junious Silk, ANP [facility] Office  [phone] Pager [phone]  On-Call/Text Page:      [email or web address]      password [email or web address]  If 7PM-7AM, please contact night-coverage [email or web address] Password [facility] 02/17/2012, 10:34 AM   LOS: 2 days   I have examined the patient and reviewed the chart. I have modified the above note and agree with it.   Calvert Cantor, MD [phone]

## 2012-02-17 NOTE — Progress Notes (Signed)
Pt report called to 6N. VSS. Meds given. Family notified.

## 2012-02-17 NOTE — Progress Notes (Signed)
Subjective: Black stool and then followed by a brown stool.  He feels well at this time.  Objective: Vital signs in last 24 hours: Temp:  [97.8 F (36.6 C)-99 F (37.2 C)] 98.3 F (36.8 C) (08/02 0719) Pulse Rate:  [49-73] 55  (08/02 0719) Resp:  [16-24] 16  (08/02 0425) BP: (132-153)/(58-81) 137/58 mmHg (08/02 0719) SpO2:  [96 %-99 %] 97 % (08/02 0719) Last BM Date: 02/15/12  Intake/Output from previous day: 08/01 0701 - 08/02 0700 In: 140 [I.V.:140] Out: 3077 [Urine:3075; Stool:2] Intake/Output this shift:    General appearance: alert and no distress GI: soft, non-tender; bowel sounds normal; no masses,  no organomegaly  Lab Results:  [facility] 02/17/12 0515 02/16/12 2318 02/16/12 0427  WBC 7.6 8.5 7.5  HGB 11.5* 10.9* 9.4*  HCT 34.5* 32.5* 27.7*  PLT 139* 126* 127*   BMET  Basename 02/16/12 0427 02/15/12 1057  NA 138 138  K 3.9 4.1  CL 105 101  CO2 26 --  GLUCOSE 114* 132*  BUN 18 15  CREATININE 0.96 1.10  CALCIUM 8.4 --   LFT No results found for this basename: PROT,ALBUMIN,AST,ALT,ALKPHOS,BILITOT,BILIDIR,IBILI in the last 72 hours PT/INR  Basename 02/15/12 2132  LABPROT 14.7  INR 1.13   Hepatitis Panel No results found for this basename: HEPBSAG,HCVAB,HEPAIGM,HEPBIGM in the last 72 hours C-Diff No results found for this basename: CDIFFTOX:3 in the last 72 hours Fecal Lactopherrin No results found for this basename: FECLLACTOFRN in the last 72 hours  Studies/Results: Nm Gi Blood Loss  02/16/2012  *RADIOLOGY REPORT*  Clinical Data: Bright red blood per rectum.  Lower GI bleed.  NUCLEAR MEDICINE GASTROINTESTINAL BLEEDING STUDY  Technique:  Sequential abdominal images were obtained following intravenous administration of Tc-30m labeled red blood cells.  Radiopharmaceutical: CURIE ULTRATAG TECHNETIUM TC 50M- LABELED RED BLOOD CELLS IV KIT  Comparison: None.  Findings: No extravasation is identified.  There is uptake in the penis and accumulation of  radiotracer in the urinary bladder. There is no peristalsis of radiotracer in these two regions.  IMPRESSION: Negative nuclear medicine bleeding study.  Original Report Authenticated By: Andreas Newport, M.D.    Medications:  Scheduled:   . amLODipine  5 mg Oral Daily  . doxazosin  4 mg Oral QHS  . sodium chloride  3 mL Intravenous Q12H  . DISCONTD: amLODipine  10 mg Oral Daily   Continuous:   . sodium chloride 100 mL/hr at 02/17/12 0701    Assessment/Plan: 1) Diverticular bleed   It appears that his bleeding has stopped at this time.  The bleeding scan is negative for a bleeding site and his HGB has increased with the blood transfusions.  Plan: 1) Continue to monitor HGB.   2) If no further bleeding today then he can go home tomorrow AM. 3) I will have him follow up in the office in 2 weeks.  LOS: 2 days   Cherron Blitzer D 02/17/2012, 9:00 AM

## 2012-02-18 LAB — CBC
HCT: 31.3 % — ABNORMAL LOW (ref 39.0–52.0)
Hemoglobin: 10.6 g/dL — ABNORMAL LOW (ref 13.0–17.0)
MCH: 28.6 pg (ref 26.0–34.0)
MCHC: 33.9 g/dL (ref 30.0–36.0)
RBC: 3.7 MIL/uL — ABNORMAL LOW (ref 4.22–5.81)

## 2012-02-18 NOTE — Progress Notes (Signed)
Patient nsl dc'd, home care instructions and home medications gone over. Follow up appointments to be made. Patient understand instructions. Diet and bleeding precautions discussed. Patient stated he was to stop taking aspirin and decrease salt in diet.

## 2012-03-07 NOTE — Discharge Summary (Signed)
Death Summary  Austin Gray. GNF:[id number] DOB: 1932/02/15 DOA: March 05, 2012  PCP: Leo Grosser, MD  Admit date: 2012/03/05 Date of Death: 08-Mar-2012  Final Diagnoses:  Principal Problem:  *Lower GI bleeding Active Problems:  Acute blood loss anemia  History of BPH  Diverticulosis  Colon polyps  Hypotension with a history of hypertension  History of present illness:  78 year old male patient with history of colon polyps and diverticulosis presented to the ER with multiple episodes of red blood (started on the morning of admission. He's had 7 episodes of this since onset. No significant end-stage usage-only 81 mg of aspirin daily. Last colonoscopy one year ago by Dr. Elnoria Howard at which point he was found to have diverticulosis as well as 2 polyps which were removed. He was sent to wait for [facility] for a procedure to remove a third polyp. The patient does note that after the third polyp was removed he did have some bright red blood (and some anemia. Because of ongoing bleeding the patient was admitted to the step down unit for further monitoring and evaluation.      Hospital Course:  *Lower GI bleeding  *Bloody stools have resolved without any intervention- as he recently had a colonoscypy, GI did not feel that it was necessary to repeat it- regular diet has been resumed and no further bloody BMs are noted. He is advised to continue to hold ASA until further f/u with his PCP which should be in 1 wk  Active Problems:  Acute blood loss anemia  *Patient has received one unit of packed red blood cells since admission  *Hemoglobin decreased from 13 to 9.6 on 02/16/2012 and he was subsequently transfused 2 units of packed red blood cells and as of this morning his hemoglobin is 10.6.   Diverticulosis/Colon polyps - noted on prior colonoscopy *Likely etiology to patient's bleeding although he also reports he's had issues with hemorrhoids in the past   Hypotension    *Relative hypotension at presentation and has improved with administration of blood products   Hypertension  *ACE inhibitor held at time of admission  *We have resumed his home medications with improvement in BP  History of BPH  *Rationale for dosing Cardura at hour of sleep   Discharge Exam Gen: alert, no distress Lungs: CTA b/l  Abdomen: soft, NT, ND, BS +   CONTINUE taking these medications        amLODipine 10 MG tablet daily     benazepril 40 MG tablet daily     doxazosin 4 MG tablet QHS     hydrochlorothiazide 25 MG tablet daily     multivitamin with minerals Tabs daily      STOP taking these medications       aspirin 81 MG tablet           Discharge Instructions     Diet - low sodium heart healthy     Low fat- avoid seeds and small nuts, high fiber diet.   Increase activity slowly     Follow-up Information     Follow up with HUNG,PATRICK D, MD. Schedule an appointment as soon as possible for a visit in 2 weeks.   Contact information:   [address]  [place] [address]  [phone]        Follow up with [facility] TOM, MD. Schedule an appointment as soon as possible for a visit in 1 week.   Contact information:   [address]  [facility] [place] [address]  [phone]           Time:  > 45 min  Signed:  Rayden Scheper  [facility] 03/07/2012, 4:56 PM

## 2012-09-19 ENCOUNTER — Ambulatory Visit (INDEPENDENT_AMBULATORY_CARE_PROVIDER_SITE_OTHER): Payer: Medicare Other | Admitting: Ophthalmology

## 2012-09-29 ENCOUNTER — Encounter: Payer: Self-pay | Admitting: *Deleted

## 2012-10-04 ENCOUNTER — Encounter: Payer: Self-pay | Admitting: Family Medicine

## 2012-10-04 ENCOUNTER — Ambulatory Visit (INDEPENDENT_AMBULATORY_CARE_PROVIDER_SITE_OTHER): Payer: Medicare Other | Admitting: Family Medicine

## 2012-10-04 ENCOUNTER — Ambulatory Visit: Payer: Self-pay | Admitting: Family Medicine

## 2012-10-04 VITALS — BP 130/70 | HR 60 | Temp 98.1°F | Resp 20 | Wt 241.0 lb

## 2012-10-04 DIAGNOSIS — R300 Dysuria: Secondary | ICD-10-CM

## 2012-10-04 DIAGNOSIS — I10 Essential (primary) hypertension: Secondary | ICD-10-CM

## 2012-10-04 DIAGNOSIS — N390 Urinary tract infection, site not specified: Secondary | ICD-10-CM

## 2012-10-04 DIAGNOSIS — E538 Deficiency of other specified B group vitamins: Secondary | ICD-10-CM

## 2012-10-04 LAB — URINALYSIS, ROUTINE W REFLEX MICROSCOPIC
Bilirubin Urine: NEGATIVE
Glucose, UA: NEGATIVE mg/dL
Leukocytes, UA: NEGATIVE
pH: 6 (ref 5.0–8.0)

## 2012-10-04 LAB — BASIC METABOLIC PANEL
Calcium: 9.9 mg/dL (ref 8.4–10.5)
Potassium: 4.1 mEq/L (ref 3.5–5.3)
Sodium: 136 mEq/L (ref 135–145)

## 2012-10-04 LAB — URINALYSIS, MICROSCOPIC ONLY
Casts: NONE SEEN
Crystals: NONE SEEN
RBC / HPF: NONE SEEN RBC/hpf (ref <3)
WBC, UA: NONE SEEN WBC/hpf (ref <3)

## 2012-10-04 LAB — PSA: PSA: 1.85 ng/mL (ref <=4.00)

## 2012-10-04 MED ORDER — CYANOCOBALAMIN 1000 MCG/ML IJ SOLN
1000.0000 ug | Freq: Once | INTRAMUSCULAR | Status: AC
  Administered 2012-10-04: 1000 ug via INTRAMUSCULAR

## 2012-10-04 NOTE — Telephone Encounter (Signed)
This encounter was created in error - please disregard.

## 2012-10-04 NOTE — Progress Notes (Signed)
Subjective:     Patient ID: Austin Gray., male   DOB: 08-Jan-1932, 79 y.o.   MRN: [id number]  HPI Patient presents with 2 problems #1 hypertension-  chronic ongoing problem with difficult control.  He is currently on Norvasc 10 mg by mouth daily, benazepril 40 mg by mouth daily, and clonidine 0.1 mg by mouth each bedtime, doxazosin 4 mg by mouth daily and hydrochlorothiazide 25 mg by mouth daily.  Today his blood pressure is well controlled.  He denies any chest pain shortness of breath or significant dyspnea on exertion. #2 dysuria- patient was treated in November with a urinary tract infection using Cipro 500 mg by mouth twice a day for 14 days.  At that time he had moderate blood in his urine positive nitrites large amount of leukocyte esterase.  His urine culture showed Escherichia coli sensitive to Cipro.  Patient states that ever since then he will have episodic dysuria.  He will burn for a few seconds when he voids, and this usually lasts one day.  This occurrs every 2-3 weeks.  Review of Systems  Constitutional: Negative.   HENT: Negative.   Respiratory: Negative.   Cardiovascular: Negative.   Gastrointestinal: Negative.   Genitourinary: Positive for dysuria, frequency and difficulty urinating. Negative for urgency, hematuria, discharge, scrotal swelling, penile pain and testicular pain.       Objective:   Physical Exam  Constitutional: He appears well-developed and well-nourished.  HENT:  Head: Normocephalic and atraumatic.  Neck: Normal range of motion. Neck supple. No thyromegaly present.  Cardiovascular: Normal rate, regular rhythm and normal heart sounds.   No murmur heard. Pulmonary/Chest: Effort normal and breath sounds normal.  Abdominal: Soft. Bowel sounds are normal.  Genitourinary: Rectum normal and penis normal. Prostate is enlarged and tender.   there is no prostate nodularity.     Assessment:      Hypertension Dysuria BPH    Plan:      #1  hypertension-well controlled continue present medications at current doses. Check a CMP and followup in 3-6 months. #2 dysuria-think this is likely due to his BPH.  He likely has an element of urinary retention, causing occasional urethral irritation.  He is not currently interested in other treatment.  If symptoms become more persistent we could try finasteride.  I also cautioned him about symptoms of a urinary tract infection and when to treat that.  We will check a urine culture today.

## 2012-10-06 LAB — URINE CULTURE: Colony Count: NO GROWTH

## 2012-10-23 ENCOUNTER — Ambulatory Visit (INDEPENDENT_AMBULATORY_CARE_PROVIDER_SITE_OTHER): Payer: Medicare Other | Admitting: Ophthalmology

## 2012-10-23 DIAGNOSIS — H2510 Age-related nuclear cataract, unspecified eye: Secondary | ICD-10-CM

## 2012-10-23 DIAGNOSIS — H3530 Unspecified macular degeneration: Secondary | ICD-10-CM

## 2012-10-23 DIAGNOSIS — H43819 Vitreous degeneration, unspecified eye: Secondary | ICD-10-CM

## 2012-10-23 DIAGNOSIS — H35039 Hypertensive retinopathy, unspecified eye: Secondary | ICD-10-CM

## 2012-10-23 DIAGNOSIS — I10 Essential (primary) hypertension: Secondary | ICD-10-CM

## 2012-11-16 ENCOUNTER — Ambulatory Visit (INDEPENDENT_AMBULATORY_CARE_PROVIDER_SITE_OTHER): Payer: Medicare Other | Admitting: *Deleted

## 2012-11-16 DIAGNOSIS — E538 Deficiency of other specified B group vitamins: Secondary | ICD-10-CM

## 2012-11-16 MED ORDER — CYANOCOBALAMIN 1000 MCG/ML IJ SOLN
1000.0000 ug | Freq: Once | INTRAMUSCULAR | Status: AC
  Administered 2012-11-16: 1000 ug via INTRAMUSCULAR

## 2012-11-29 ENCOUNTER — Other Ambulatory Visit: Payer: Self-pay | Admitting: Family Medicine

## 2012-11-29 NOTE — Telephone Encounter (Signed)
Medication refilled per protocol. 

## 2012-12-18 ENCOUNTER — Ambulatory Visit (INDEPENDENT_AMBULATORY_CARE_PROVIDER_SITE_OTHER): Payer: Medicare Other | Admitting: *Deleted

## 2012-12-18 DIAGNOSIS — E538 Deficiency of other specified B group vitamins: Secondary | ICD-10-CM

## 2012-12-18 MED ORDER — CYANOCOBALAMIN 1000 MCG/ML IJ SOLN
1000.0000 ug | Freq: Once | INTRAMUSCULAR | Status: AC
  Administered 2012-12-18: 1000 ug via INTRAMUSCULAR

## 2013-01-14 ENCOUNTER — Other Ambulatory Visit: Payer: Self-pay | Admitting: Family Medicine

## 2013-01-14 NOTE — Telephone Encounter (Signed)
?   OK to Refill  

## 2013-01-14 NOTE — Telephone Encounter (Signed)
LOV was with Dr. Demetrius Charity 10/04/12. BP was controlled. Was to f/u 6 months. Ok to refill plus 2 additional refills. Due for OV 04/06/13.

## 2013-01-15 ENCOUNTER — Other Ambulatory Visit: Payer: Self-pay | Admitting: Family Medicine

## 2013-01-15 NOTE — Telephone Encounter (Signed)
Med refilled.

## 2013-01-17 ENCOUNTER — Ambulatory Visit (INDEPENDENT_AMBULATORY_CARE_PROVIDER_SITE_OTHER): Payer: Medicare Other | Admitting: *Deleted

## 2013-01-17 DIAGNOSIS — E538 Deficiency of other specified B group vitamins: Secondary | ICD-10-CM

## 2013-01-17 MED ORDER — CYANOCOBALAMIN 1000 MCG/ML IJ SOLN
1000.0000 ug | Freq: Once | INTRAMUSCULAR | Status: AC
  Administered 2013-01-17: 1000 ug via INTRAMUSCULAR

## 2013-02-20 ENCOUNTER — Ambulatory Visit (INDEPENDENT_AMBULATORY_CARE_PROVIDER_SITE_OTHER): Payer: Medicare Other | Admitting: Family Medicine

## 2013-02-20 DIAGNOSIS — D519 Vitamin B12 deficiency anemia, unspecified: Secondary | ICD-10-CM

## 2013-02-20 DIAGNOSIS — D518 Other vitamin B12 deficiency anemias: Secondary | ICD-10-CM

## 2013-02-20 MED ORDER — CYANOCOBALAMIN 1000 MCG/ML IJ SOLN
1000.0000 ug | Freq: Once | INTRAMUSCULAR | Status: AC
  Administered 2013-02-20: 1000 ug via INTRAMUSCULAR

## 2013-03-13 ENCOUNTER — Other Ambulatory Visit: Payer: Self-pay | Admitting: Family Medicine

## 2013-03-22 ENCOUNTER — Ambulatory Visit (INDEPENDENT_AMBULATORY_CARE_PROVIDER_SITE_OTHER): Payer: Medicare Other | Admitting: Family Medicine

## 2013-03-22 ENCOUNTER — Encounter: Payer: Self-pay | Admitting: Family Medicine

## 2013-03-22 VITALS — BP 130/70 | HR 62 | Temp 98.3°F | Resp 22 | Ht 70.0 in | Wt 233.0 lb

## 2013-03-22 DIAGNOSIS — Z0000 Encounter for general adult medical examination without abnormal findings: Secondary | ICD-10-CM

## 2013-03-22 DIAGNOSIS — E538 Deficiency of other specified B group vitamins: Secondary | ICD-10-CM

## 2013-03-22 MED ORDER — CYANOCOBALAMIN 1000 MCG/ML IJ SOLN
1000.0000 ug | Freq: Once | INTRAMUSCULAR | Status: AC
  Administered 2013-03-22: 1000 ug via INTRAMUSCULAR

## 2013-03-22 NOTE — Progress Notes (Signed)
Subjective:    Patient ID: Austin Elk., male    DOB: May 09, 1932, 79 y.o.   MRN: [id number]  HPI Patient is a very pleasant 79 year old white male who is here today for a complete physical exam. He has no medical concerns. He does report that he feels sad frequently. His granddaughter lives with him with her children. This causes increased stress in his life. He does feel depressed somewhat however he states it is not bad enough that he wants to try medication for it. Overall he sleeps well. He has a good appetite. His energy is acceptable. He denies any anhedonia or suicidal thoughts.  Other than that he is doing well. He had a pneumonia vaccine in 2000 and does not require another. He is due for Tdap, zostavax, and a flu shot. He is due for a prostate exam. His colonoscopy was in 2012. They found a colon polyp. He is not due again until 2015. Past Medical History  Diagnosis Date  . Hypertension   . History of BPH   . Visual impairment   . Hemorrhoids   . Diverticulosis   . Gastritis   . Basal cell carcinoma   . Vitamin D deficiency   . Colon polyps   . Pernicious anemia   . Exertional dyspnea     "whenever I'm mowing or something"  . Rectal bleeding 02/15/12  . Arthritis     "little bit; left knee"  . Impaired vision   . PVC (premature ventricular contraction)   . H/O: GI bleed     Recurrent   Past Surgical History  Procedure Laterality Date  . Inguinal hernia repair  1958; ~ 2002    left; right   Current Outpatient Prescriptions on File Prior to Visit  Medication Sig Dispense Refill  . amLODipine (NORVASC) 10 MG tablet TAKE 1 TABLET BY MOUTH DAILY  90 tablet  1  . benazepril (LOTENSIN) 40 MG tablet TAKE 1 TABLET BY MOUTH DAILY  90 tablet  1  . cloNIDine (CATAPRES) 0.1 MG tablet TAKE 1 TABLET BY MOUTH DAILY AT BEDTIME  30 tablet  3  . doxazosin (CARDURA) 4 MG tablet TAKE 1 TABLET EVERY DAY  30 tablet  5  . ferrous sulfate 325 (65 FE) MG tablet Take 325 mg by mouth  daily with breakfast.      . hydrochlorothiazide 25 MG tablet Take 25 mg by mouth daily.        . Multiple Vitamin (MULTIVITAMIN WITH MINERALS) TABS Take 1 tablet by mouth daily.      . multivitamin-lutein (OCUVITE-LUTEIN) CAPS Take 1 capsule by mouth daily.       No current facility-administered medications on file prior to visit.   No Known Allergies History   Social History  . Marital Status: Widowed    Spouse Name: N/A    Number of Children: N/A  . Years of Education: N/A   Occupational History  . retired    Social History Main Topics  . Smoking status: Never Smoker   . Smokeless tobacco: Current User    Types: Snuff     Comment: 02/15/12 "still use snuff @ times; not daily"  . Alcohol Use: No  . Drug Use: No  . Sexual Activity: No     Comment: widower, lives with granddaughter and great grandchildren.   Other Topics Concern  . Not on file   Social History Narrative   Lives with grand daughter   No family history on file. Family  history is noncontributory at his age.   Review of Systems  All other systems reviewed and are negative.       Objective:   Physical Exam  Vitals reviewed. Constitutional: He is oriented to person, place, and time. He appears well-developed and well-nourished. No distress.  HENT:  Head: Normocephalic and atraumatic.  Right Ear: External ear normal.  Left Ear: External ear normal.  Mouth/Throat: Oropharynx is clear and moist. No oropharyngeal exudate.  Eyes: Conjunctivae and EOM are normal. Pupils are equal, round, and reactive to light. Right eye exhibits no discharge. Left eye exhibits no discharge. No scleral icterus.  Neck: Normal range of motion. Neck supple. No JVD present. No tracheal deviation present. No thyromegaly present.  Cardiovascular: Normal rate, regular rhythm, normal heart sounds and intact distal pulses.  Exam reveals no gallop and no friction rub.   No murmur heard. Pulmonary/Chest: Effort normal and breath  sounds normal. No stridor. No respiratory distress. He has no wheezes. He has no rales. He exhibits no tenderness.  Abdominal: Soft. Bowel sounds are normal. He exhibits no distension and no mass. There is no tenderness. There is no rebound and no guarding.  Musculoskeletal: Normal range of motion. He exhibits no edema and no tenderness.  Lymphadenopathy:    He has no cervical adenopathy.  Neurological: He is alert and oriented to person, place, and time. He has normal reflexes. He displays normal reflexes. No cranial nerve deficit. He exhibits normal muscle tone. Coordination normal.  Skin: Skin is warm. No rash noted. He is not diaphoretic. No erythema. No pallor.  Psychiatric: He has a normal mood and affect. His behavior is normal. Judgment and thought content normal.   prostate is slightly enlarged. There is a small nodule on the lower right lobe.        Assessment & Plan:  1. Routine general medical examination at a health care facility Aside from the abnormality on his prostate exam, his physical is only significant for a large ventral hernia which the patient does not want repaired at this time. He also does not require or request medication for depression at this time. He was given the flu shot today. I recommended a tetanus shot and a shingles shot but the patient will call his insurance and determine the cost prior to deciding whether or not he wants [place] to give him the vaccine.  His colonoscopy is up-to-date. I will check CBC, CMP, fasting lipid panel, PSA, thyroid test. His prostate exam is abnormal. However given his age I would elect to monitor this abnormality especially if his PSA is less than 4. If his PSA is extremely elevated, I would recommend a urology consult. - CBC with Differential; Future - COMPLETE METABOLIC PANEL WITH GFR; Future - Lipid panel; Future - PSA, Medicare; Future - TSH; Future

## 2013-03-25 ENCOUNTER — Other Ambulatory Visit: Payer: Medicare Other

## 2013-03-25 DIAGNOSIS — Z0000 Encounter for general adult medical examination without abnormal findings: Secondary | ICD-10-CM

## 2013-03-25 LAB — CBC WITH DIFFERENTIAL/PLATELET
Eosinophils Absolute: 0.2 10*3/uL (ref 0.0–0.7)
Eosinophils Relative: 3 % (ref 0–5)
HCT: 39.8 % (ref 39.0–52.0)
Lymphocytes Relative: 21 % (ref 12–46)
Lymphs Abs: 1.6 10*3/uL (ref 0.7–4.0)
MCH: 28 pg (ref 26.0–34.0)
MCV: 82.4 fL (ref 78.0–100.0)
Monocytes Absolute: 0.6 10*3/uL (ref 0.1–1.0)
Monocytes Relative: 7 % (ref 3–12)
RBC: 4.83 MIL/uL (ref 4.22–5.81)
WBC: 7.7 10*3/uL (ref 4.0–10.5)

## 2013-03-25 LAB — LIPID PANEL
LDL Cholesterol: 107 mg/dL — ABNORMAL HIGH (ref 0–99)
Triglycerides: 48 mg/dL (ref <150)

## 2013-03-25 LAB — COMPLETE METABOLIC PANEL WITH GFR
ALT: 18 U/L (ref 0–53)
BUN: 13 mg/dL (ref 6–23)
CO2: 27 mEq/L (ref 19–32)
Calcium: 9.4 mg/dL (ref 8.4–10.5)
Chloride: 103 mEq/L (ref 96–112)
Creat: 0.82 mg/dL (ref 0.50–1.35)
GFR, Est African American: >89 mL/min
GFR, Est Non African American: 84 mL/min
Glucose, Bld: 99 mg/dL (ref 70–99)
Total Bilirubin: 0.9 mg/dL (ref 0.3–1.2)

## 2013-05-14 ENCOUNTER — Other Ambulatory Visit: Payer: Self-pay | Admitting: Family Medicine

## 2013-05-15 ENCOUNTER — Other Ambulatory Visit: Payer: Self-pay | Admitting: Family Medicine

## 2013-05-20 ENCOUNTER — Ambulatory Visit (INDEPENDENT_AMBULATORY_CARE_PROVIDER_SITE_OTHER): Payer: Medicare Other | Admitting: Family Medicine

## 2013-05-20 DIAGNOSIS — E538 Deficiency of other specified B group vitamins: Secondary | ICD-10-CM

## 2013-05-20 MED ORDER — CYANOCOBALAMIN 1000 MCG/ML IJ SOLN
1000.0000 ug | Freq: Once | INTRAMUSCULAR | Status: AC
  Administered 2013-05-20: 1000 ug via INTRAMUSCULAR

## 2013-06-13 ENCOUNTER — Other Ambulatory Visit: Payer: Self-pay | Admitting: Family Medicine

## 2013-06-14 ENCOUNTER — Other Ambulatory Visit: Payer: Self-pay | Admitting: Family Medicine

## 2013-07-03 ENCOUNTER — Other Ambulatory Visit: Payer: Self-pay | Admitting: Family Medicine

## 2013-07-03 MED ORDER — HYDROCHLOROTHIAZIDE 25 MG PO TABS: 25.0000 mg | ORAL_TABLET | Freq: Every day | ORAL | Status: DC

## 2013-07-03 NOTE — Telephone Encounter (Signed)
Rx Refilled  

## 2013-07-23 ENCOUNTER — Ambulatory Visit (INDEPENDENT_AMBULATORY_CARE_PROVIDER_SITE_OTHER): Payer: Medicare HMO | Admitting: *Deleted

## 2013-07-23 DIAGNOSIS — E538 Deficiency of other specified B group vitamins: Secondary | ICD-10-CM

## 2013-07-23 MED ORDER — CYANOCOBALAMIN 1000 MCG/ML IJ SOLN: 1000.0000 ug | Freq: Once | INTRAMUSCULAR | Status: DC

## 2013-07-23 MED ORDER — CYANOCOBALAMIN 1000 MCG/ML IJ SOLN
1000.0000 ug | Freq: Once | INTRAMUSCULAR | Status: AC
  Administered 2013-07-23: 1000 ug via INTRAMUSCULAR

## 2013-08-01 ENCOUNTER — Telehealth: Payer: Self-pay | Admitting: *Deleted

## 2013-08-01 MED ORDER — AMOXICILLIN 875 MG PO TABS: 875.0000 mg | ORAL_TABLET | Freq: Two times a day (BID) | ORAL | Status: DC

## 2013-08-01 NOTE — Telephone Encounter (Signed)
Med sent into pharm and pt aware

## 2013-08-01 NOTE — Telephone Encounter (Signed)
Amoxicillin 875 bid for 10 days 

## 2013-08-01 NOTE — Telephone Encounter (Signed)
Wants to know if you can give an antibiotic for abscess tooth?

## 2013-09-20 ENCOUNTER — Ambulatory Visit (INDEPENDENT_AMBULATORY_CARE_PROVIDER_SITE_OTHER): Payer: Medicare HMO | Admitting: Family Medicine

## 2013-09-20 DIAGNOSIS — E538 Deficiency of other specified B group vitamins: Secondary | ICD-10-CM

## 2013-09-20 MED ORDER — CYANOCOBALAMIN 1000 MCG/ML IJ SOLN
1000.0000 ug | Freq: Once | INTRAMUSCULAR | Status: AC
  Administered 2013-09-20: 1000 ug via INTRAMUSCULAR

## 2013-10-17 ENCOUNTER — Other Ambulatory Visit: Payer: Self-pay | Admitting: Family Medicine

## 2013-10-17 DIAGNOSIS — H269 Unspecified cataract: Secondary | ICD-10-CM

## 2013-10-22 ENCOUNTER — Telehealth: Payer: Self-pay | Admitting: Family Medicine

## 2013-10-22 ENCOUNTER — Ambulatory Visit (INDEPENDENT_AMBULATORY_CARE_PROVIDER_SITE_OTHER): Payer: Medicare HMO | Admitting: Family Medicine

## 2013-10-22 DIAGNOSIS — E538 Deficiency of other specified B group vitamins: Secondary | ICD-10-CM

## 2013-10-22 MED ORDER — CYANOCOBALAMIN 1000 MCG/ML IJ SOLN
1000.0000 ug | Freq: Once | INTRAMUSCULAR | Status: AC
  Administered 2013-10-22: 1000 ug via INTRAMUSCULAR

## 2013-10-22 MED ORDER — ALPRAZOLAM 0.5 MG PO TBDP: 0.5000 mg | ORAL_TABLET | Freq: Every day | ORAL | Status: DC | PRN

## 2013-10-22 NOTE — Telephone Encounter (Signed)
ok 

## 2013-10-22 NOTE — Telephone Encounter (Signed)
Pt brought old bottle of Alprazolam 0.5 mg tablets (RX Q8hr prn).  States having stress at home and requests refill.  States only want #15, he only uses sparingly and 15 will last him a long time.  Doesn't want a lot at his house and he does lock them up.  OK refill?

## 2013-10-22 NOTE — Telephone Encounter (Signed)
RX called in #15 QD prn no refills. Pt aware

## 2013-10-24 ENCOUNTER — Ambulatory Visit (INDEPENDENT_AMBULATORY_CARE_PROVIDER_SITE_OTHER): Payer: Medicare Other | Admitting: Ophthalmology

## 2013-10-31 ENCOUNTER — Ambulatory Visit (INDEPENDENT_AMBULATORY_CARE_PROVIDER_SITE_OTHER): Payer: Medicare HMO | Admitting: Ophthalmology

## 2013-10-31 DIAGNOSIS — H3530 Unspecified macular degeneration: Secondary | ICD-10-CM

## 2013-10-31 DIAGNOSIS — H35039 Hypertensive retinopathy, unspecified eye: Secondary | ICD-10-CM

## 2013-10-31 DIAGNOSIS — H43819 Vitreous degeneration, unspecified eye: Secondary | ICD-10-CM

## 2013-10-31 DIAGNOSIS — H2510 Age-related nuclear cataract, unspecified eye: Secondary | ICD-10-CM

## 2013-10-31 DIAGNOSIS — I10 Essential (primary) hypertension: Secondary | ICD-10-CM

## 2013-11-20 ENCOUNTER — Ambulatory Visit (INDEPENDENT_AMBULATORY_CARE_PROVIDER_SITE_OTHER): Payer: Medicare HMO | Admitting: *Deleted

## 2013-11-20 DIAGNOSIS — E538 Deficiency of other specified B group vitamins: Secondary | ICD-10-CM

## 2013-11-20 MED ORDER — CYANOCOBALAMIN 1000 MCG/ML IJ SOLN
1000.0000 ug | Freq: Once | INTRAMUSCULAR | Status: AC
  Administered 2013-11-20: 1000 ug via INTRAMUSCULAR

## 2013-12-12 ENCOUNTER — Other Ambulatory Visit: Payer: Self-pay | Admitting: Family Medicine

## 2013-12-12 NOTE — Telephone Encounter (Signed)
Refill appropriate and filled per protocol. 

## 2013-12-23 ENCOUNTER — Ambulatory Visit (INDEPENDENT_AMBULATORY_CARE_PROVIDER_SITE_OTHER): Payer: Medicare HMO | Admitting: *Deleted

## 2013-12-23 DIAGNOSIS — E538 Deficiency of other specified B group vitamins: Secondary | ICD-10-CM

## 2013-12-23 DIAGNOSIS — Z1211 Encounter for screening for malignant neoplasm of colon: Secondary | ICD-10-CM

## 2013-12-23 MED ORDER — CYANOCOBALAMIN 1000 MCG/ML IJ SOLN
1000.0000 ug | Freq: Once | INTRAMUSCULAR | Status: AC
  Administered 2013-12-23: 1000 ug via INTRAMUSCULAR

## 2013-12-27 ENCOUNTER — Telehealth: Payer: Self-pay | Admitting: Family Medicine

## 2013-12-27 DIAGNOSIS — I10 Essential (primary) hypertension: Secondary | ICD-10-CM

## 2013-12-27 MED ORDER — CLONIDINE HCL 0.1 MG PO TABS: 0.1000 mg | ORAL_TABLET | Freq: Every day | ORAL | Status: DC

## 2013-12-27 NOTE — Telephone Encounter (Signed)
Medication refilled per protocol. 

## 2014-01-23 ENCOUNTER — Ambulatory Visit (INDEPENDENT_AMBULATORY_CARE_PROVIDER_SITE_OTHER): Payer: Medicare HMO | Admitting: *Deleted

## 2014-01-23 ENCOUNTER — Encounter: Payer: Self-pay | Admitting: *Deleted

## 2014-01-23 DIAGNOSIS — E539 Vitamin B deficiency, unspecified: Secondary | ICD-10-CM

## 2014-01-23 MED ORDER — CYANOCOBALAMIN 1000 MCG/ML IJ SOLN
1000.0000 ug | Freq: Once | INTRAMUSCULAR | Status: AC
  Administered 2014-01-23: 1000 ug via INTRAMUSCULAR

## 2014-01-23 NOTE — Progress Notes (Signed)
Patient ID: Austin Gray., male   DOB: [birth year], 80 y.o.   MRN: [id number] Patient here for Vit B12 injection.   Tolerated injection well.

## 2014-02-03 ENCOUNTER — Encounter: Payer: Self-pay | Admitting: Family Medicine

## 2014-02-05 ENCOUNTER — Telehealth: Payer: Self-pay | Admitting: *Deleted

## 2014-02-05 NOTE — Telephone Encounter (Signed)
Received fax from [facility] care mgmt with authorization number [id number] for [facility].

## 2014-02-24 ENCOUNTER — Ambulatory Visit (INDEPENDENT_AMBULATORY_CARE_PROVIDER_SITE_OTHER): Payer: Medicare HMO | Admitting: *Deleted

## 2014-02-24 DIAGNOSIS — D62 Acute posthemorrhagic anemia: Secondary | ICD-10-CM

## 2014-02-24 MED ORDER — CYANOCOBALAMIN 1000 MCG/ML IJ SOLN
1000.0000 ug | Freq: Once | INTRAMUSCULAR | Status: AC
  Administered 2014-02-24: 1000 ug via INTRAMUSCULAR

## 2014-03-27 ENCOUNTER — Encounter: Payer: Self-pay | Admitting: Family Medicine

## 2014-03-27 ENCOUNTER — Ambulatory Visit (INDEPENDENT_AMBULATORY_CARE_PROVIDER_SITE_OTHER): Payer: Medicare HMO | Admitting: Family Medicine

## 2014-03-27 VITALS — BP 142/80 | HR 76 | Temp 98.1°F | Resp 18 | Ht 70.0 in | Wt 229.0 lb

## 2014-03-27 DIAGNOSIS — E538 Deficiency of other specified B group vitamins: Secondary | ICD-10-CM

## 2014-03-27 DIAGNOSIS — Z0000 Encounter for general adult medical examination without abnormal findings: Secondary | ICD-10-CM

## 2014-03-27 DIAGNOSIS — Z23 Encounter for immunization: Secondary | ICD-10-CM

## 2014-03-27 MED ORDER — CYANOCOBALAMIN 1000 MCG/ML IJ SOLN
1000.0000 ug | Freq: Once | INTRAMUSCULAR | Status: AC
  Administered 2014-03-27: 1000 ug via INTRAMUSCULAR

## 2014-03-27 NOTE — Progress Notes (Signed)
Subjective:    Patient ID: Austin Mesi., male    DOB: 1931/12/02, 80 y.o.   MRN: [id number]  HPI  Patient today for complete physical exam. He has no concerns.  Patient's last colonoscopy was performed earlier this year. They recommended he have a repeat colonoscopy in 3 years. He denies any signs or symptoms of lower GI bleeding this year. He does have a history of such. He is due for a prostate exam for screening for prostate cancer. He is also requesting that I trim his right great toenail which has become thick and dystrophic and now curves downward into the skin.  I was able to do this using a pair of cutters.  Patient's Pneumovax and Prevnar up to date. His tetanus shot is up-to-date. He is due for his annual flu shot. He is also due for fasting lab work. Past Medical History  Diagnosis Date  . Hypertension   . History of BPH   . Visual impairment   . Hemorrhoids   . Diverticulosis   . Gastritis   . Basal cell carcinoma   . Vitamin D deficiency   . Colon polyps     last colonoscopy 2015 (tubular adenoma)  . Pernicious anemia   . Exertional dyspnea     "whenever I'm mowing or something"  . Rectal bleeding 02/15/12  . Arthritis     "little bit; left knee"  . Impaired vision   . PVC (premature ventricular contraction)   . H/O: GI bleed     Recurrent   Past Surgical History  Procedure Laterality Date  . Inguinal hernia repair  1958; ~ 2002    left; right   Current Outpatient Prescriptions on File Prior to Visit  Medication Sig Dispense Refill  . ALPRAZolam (NIRAVAM) 0.5 MG dissolvable tablet Take 1 tablet (0.5 mg total) by mouth daily as needed for anxiety.  15 tablet  0  . amLODipine (NORVASC) 10 MG tablet TAKE 1 TABLET BY MOUTH DAILY  90 tablet  1  . aspirin 81 MG tablet Take 81 mg by mouth daily.      . benazepril (LOTENSIN) 40 MG tablet TAKE 1 TABLET BY MOUTH DAILY  90 tablet  1  . cloNIDine (CATAPRES) 0.1 MG tablet Take 1 tablet (0.1 mg total) by mouth at  bedtime.  30 tablet  3  . doxazosin (CARDURA) 4 MG tablet TAKE 1 TABLET EVERY DAY  30 tablet  5  . ferrous sulfate 325 (65 FE) MG tablet Take 325 mg by mouth daily with breakfast.      . hydrochlorothiazide (HYDRODIURIL) 25 MG tablet Take 1 tablet (25 mg total) by mouth daily.  90 tablet  4  . Multiple Vitamin (MULTIVITAMIN WITH MINERALS) TABS Take 1 tablet by mouth daily.      . multivitamin-lutein (OCUVITE-LUTEIN) CAPS Take 1 capsule by mouth daily.       No current facility-administered medications on file prior to visit.   No Known Allergies History   Social History  . Marital Status: Widowed    Spouse Name: N/A    Number of Children: N/A  . Years of Education: N/A   Occupational History  . retired    Social History Main Topics  . Smoking status: Never Smoker   . Smokeless tobacco: Current User    Types: Snuff     Comment: 02/15/12 "still use snuff @ times; not daily"  . Alcohol Use: No  . Drug Use: No  . Sexual Activity:  No     Comment: widower, lives with granddaughter and great grandchildren.   Other Topics Concern  . Not on file   Social History Narrative   Lives with grand daughter   No family history on file.   Review of Systems  All other systems reviewed and are negative.      Objective:   Physical Exam  Vitals reviewed. Constitutional: He is oriented to person, place, and time. He appears well-developed and well-nourished. No distress.  HENT:  Head: Normocephalic and atraumatic.  Right Ear: External ear normal.  Left Ear: External ear normal.  Nose: Nose normal.  Mouth/Throat: Oropharynx is clear and moist. No oropharyngeal exudate.  Eyes: Conjunctivae and EOM are normal. Pupils are equal, round, and reactive to light. Right eye exhibits no discharge. Left eye exhibits no discharge. No scleral icterus.  Neck: Normal range of motion. Neck supple. No JVD present. No tracheal deviation present. No thyromegaly present.  Cardiovascular: Normal rate,  regular rhythm, normal heart sounds and intact distal pulses.  Exam reveals no gallop and no friction rub.   No murmur heard. Pulmonary/Chest: Effort normal and breath sounds normal. No stridor. No respiratory distress. He has no wheezes. He has no rales. He exhibits no tenderness.  Abdominal: Soft. Bowel sounds are normal. He exhibits no distension and no mass. There is no tenderness. There is no rebound and no guarding.  Musculoskeletal: Normal range of motion. He exhibits no edema and no tenderness.  Lymphadenopathy:    He has no cervical adenopathy.  Neurological: He is alert and oriented to person, place, and time. He has normal reflexes. He displays normal reflexes. No cranial nerve deficit. He exhibits normal muscle tone. Coordination normal.  Skin: Skin is warm. No rash noted. He is not diaphoretic. No erythema. No pallor.  Psychiatric: He has a normal mood and affect. His behavior is normal. Judgment and thought content normal.   Patient has bilateral cataracts       Assessment & Plan:  Need for prophylactic vaccination and inoculation against influenza - Plan: Flu Vaccine QUAD 36+ mos IM  B12 deficiency - Plan: cyanocobalamin ((VITAMIN B-12)) injection 1,000 mcg  Routine general medical examination at a health care facility  Patient's physical exam is normal. His blood pressure is acceptable given his age. Recommended he return fasting for CBC CMP, fasting lipid panel, and PSA. Patient received his flu vaccine today. Otherwise his cancer screening is up to date. Regular anticipatory guidance is provided. I did trim his toenails as requested.Marland Kitchen

## 2014-03-31 ENCOUNTER — Other Ambulatory Visit: Payer: Medicare HMO

## 2014-03-31 LAB — CBC WITH DIFFERENTIAL/PLATELET
BASOS ABS: 0 10*3/uL (ref 0.0–0.1)
BASOS PCT: 0 % (ref 0–1)
EOS ABS: 0.1 10*3/uL (ref 0.0–0.7)
Eosinophils Relative: 1 % (ref 0–5)
HCT: 38.1 % — ABNORMAL LOW (ref 39.0–52.0)
Hemoglobin: 13.2 g/dL (ref 13.0–17.0)
Lymphocytes Relative: 20 % (ref 12–46)
Lymphs Abs: 1.4 10*3/uL (ref 0.7–4.0)
MCH: 28.6 pg (ref 26.0–34.0)
MCHC: 34.6 g/dL (ref 30.0–36.0)
MCV: 82.5 fL (ref 78.0–100.0)
Monocytes Absolute: 0.5 10*3/uL (ref 0.1–1.0)
Monocytes Relative: 8 % (ref 3–12)
Neutro Abs: 4.8 10*3/uL (ref 1.7–7.7)
Neutrophils Relative %: 71 % (ref 43–77)
Platelets: 165 10*3/uL (ref 150–400)
RBC: 4.62 MIL/uL (ref 4.22–5.81)
RDW: 14.4 % (ref 11.5–15.5)
WBC: 6.8 10*3/uL (ref 4.0–10.5)

## 2014-03-31 LAB — COMPLETE METABOLIC PANEL WITH GFR
ALT: 16 U/L (ref 0–53)
AST: 20 U/L (ref 0–37)
Albumin: 4.1 g/dL (ref 3.5–5.2)
Alkaline Phosphatase: 65 U/L (ref 39–117)
BUN: 9 mg/dL (ref 6–23)
CO2: 28 meq/L (ref 19–32)
CREATININE: 0.82 mg/dL (ref 0.50–1.35)
Calcium: 9.5 mg/dL (ref 8.4–10.5)
Chloride: 100 mEq/L (ref 96–112)
GFR, Est Non African American: 84 mL/min
GLUCOSE: 101 mg/dL — AB (ref 70–99)
Potassium: 4.2 mEq/L (ref 3.5–5.3)
SODIUM: 136 meq/L (ref 135–145)
TOTAL PROTEIN: 6.1 g/dL (ref 6.0–8.3)
Total Bilirubin: 0.9 mg/dL (ref 0.2–1.2)

## 2014-03-31 LAB — LIPID PANEL
CHOLESTEROL: 164 mg/dL (ref 0–200)
HDL: 62 mg/dL (ref >39)
LDL Cholesterol: 91 mg/dL (ref 0–99)
Total CHOL/HDL Ratio: 2.6 Ratio
Triglycerides: 53 mg/dL (ref <150)
VLDL: 11 mg/dL (ref 0–40)

## 2014-04-01 ENCOUNTER — Encounter: Payer: Self-pay | Admitting: Family Medicine

## 2014-04-01 LAB — PSA, MEDICARE: PSA: 2.01 ng/mL (ref <=4.00)

## 2014-04-05 ENCOUNTER — Other Ambulatory Visit: Payer: Self-pay | Admitting: *Deleted

## 2014-04-05 MED ORDER — DOXAZOSIN MESYLATE 4 MG PO TABS: ORAL_TABLET | ORAL | Status: DC

## 2014-04-05 NOTE — Telephone Encounter (Signed)
Received fax requesting refill on Cardura.   Refill appropriate and filled per protocol.

## 2014-05-04 ENCOUNTER — Other Ambulatory Visit: Payer: Self-pay | Admitting: Family Medicine

## 2014-05-08 ENCOUNTER — Ambulatory Visit (INDEPENDENT_AMBULATORY_CARE_PROVIDER_SITE_OTHER): Payer: Medicare HMO | Admitting: *Deleted

## 2014-05-08 DIAGNOSIS — E538 Deficiency of other specified B group vitamins: Secondary | ICD-10-CM

## 2014-05-08 MED ORDER — CYANOCOBALAMIN 1000 MCG/ML IJ SOLN
1000.0000 ug | Freq: Once | INTRAMUSCULAR | Status: AC
  Administered 2014-05-08: 1000 ug via INTRAMUSCULAR

## 2014-05-08 NOTE — Progress Notes (Signed)
Patient ID: Austin Gray., male   DOB: 23-Mar-1932, 80 y.o.   MRN: [id number] Patient seen in office for Vitamin B12 injection.   Tolerated IM administration well.

## 2014-05-08 NOTE — Patient Instructions (Signed)
F/U in (1) month for Vitamin B12 injection.

## 2014-05-26 ENCOUNTER — Other Ambulatory Visit: Payer: Self-pay | Admitting: Family Medicine

## 2014-05-26 MED ORDER — OXAPROZIN 600 MG PO TABS: 600.0000 mg | ORAL_TABLET | Freq: Every day | ORAL | Status: DC

## 2014-06-14 ENCOUNTER — Other Ambulatory Visit: Payer: Self-pay | Admitting: Family Medicine

## 2014-06-15 ENCOUNTER — Other Ambulatory Visit: Payer: Self-pay | Admitting: Family Medicine

## 2014-06-23 ENCOUNTER — Ambulatory Visit (INDEPENDENT_AMBULATORY_CARE_PROVIDER_SITE_OTHER): Payer: Medicare HMO | Admitting: *Deleted

## 2014-06-23 DIAGNOSIS — E538 Deficiency of other specified B group vitamins: Secondary | ICD-10-CM

## 2014-06-23 MED ORDER — CYANOCOBALAMIN 1000 MCG/ML IJ SOLN
1000.0000 ug | Freq: Once | INTRAMUSCULAR | Status: AC
Start: 2014-06-23 — End: 2014-06-23
  Administered 2014-06-23: 1000 ug via INTRAMUSCULAR

## 2014-08-19 ENCOUNTER — Ambulatory Visit (INDEPENDENT_AMBULATORY_CARE_PROVIDER_SITE_OTHER): Payer: Medicare HMO | Admitting: Family Medicine

## 2014-08-19 DIAGNOSIS — E538 Deficiency of other specified B group vitamins: Secondary | ICD-10-CM | POA: Diagnosis not present

## 2014-08-19 MED ORDER — CYANOCOBALAMIN 1000 MCG/ML IJ SOLN
1000.0000 ug | Freq: Once | INTRAMUSCULAR | Status: AC
  Administered 2014-08-19: 1000 ug via INTRAMUSCULAR

## 2014-09-22 ENCOUNTER — Ambulatory Visit (INDEPENDENT_AMBULATORY_CARE_PROVIDER_SITE_OTHER): Payer: Commercial Managed Care - HMO | Admitting: *Deleted

## 2014-09-22 DIAGNOSIS — E538 Deficiency of other specified B group vitamins: Secondary | ICD-10-CM

## 2014-09-22 MED ORDER — CYANOCOBALAMIN 1000 MCG/ML IJ SOLN
1000.0000 ug | Freq: Once | INTRAMUSCULAR | Status: AC
  Administered 2014-09-22: 1000 ug via INTRAMUSCULAR

## 2014-09-29 ENCOUNTER — Other Ambulatory Visit: Payer: Self-pay | Admitting: Family Medicine

## 2014-10-27 ENCOUNTER — Ambulatory Visit (INDEPENDENT_AMBULATORY_CARE_PROVIDER_SITE_OTHER): Payer: Commercial Managed Care - HMO | Admitting: *Deleted

## 2014-10-27 DIAGNOSIS — E539 Vitamin B deficiency, unspecified: Secondary | ICD-10-CM

## 2014-10-27 MED ORDER — CYANOCOBALAMIN 1000 MCG/ML IJ SOLN
1000.0000 ug | Freq: Once | INTRAMUSCULAR | Status: AC
  Administered 2014-10-27: 1000 ug via INTRAMUSCULAR

## 2014-11-04 ENCOUNTER — Ambulatory Visit (INDEPENDENT_AMBULATORY_CARE_PROVIDER_SITE_OTHER): Payer: Commercial Managed Care - HMO | Admitting: Ophthalmology

## 2014-11-04 ENCOUNTER — Telehealth: Payer: Self-pay | Admitting: *Deleted

## 2014-11-04 DIAGNOSIS — H43813 Vitreous degeneration, bilateral: Secondary | ICD-10-CM | POA: Diagnosis not present

## 2014-11-04 DIAGNOSIS — I10 Essential (primary) hypertension: Secondary | ICD-10-CM | POA: Diagnosis not present

## 2014-11-04 DIAGNOSIS — H3531 Nonexudative age-related macular degeneration: Secondary | ICD-10-CM

## 2014-11-04 DIAGNOSIS — H35033 Hypertensive retinopathy, bilateral: Secondary | ICD-10-CM

## 2014-11-04 DIAGNOSIS — H2513 Age-related nuclear cataract, bilateral: Secondary | ICD-10-CM | POA: Diagnosis not present

## 2014-11-04 NOTE — Telephone Encounter (Signed)
Submitted [employer] referral thru [facility] for authorization to Dr. 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 Zigmund Daniel at [facility] for records/Review

## 2014-12-01 ENCOUNTER — Ambulatory Visit (INDEPENDENT_AMBULATORY_CARE_PROVIDER_SITE_OTHER): Payer: Commercial Managed Care - HMO | Admitting: Family Medicine

## 2014-12-01 DIAGNOSIS — E538 Deficiency of other specified B group vitamins: Secondary | ICD-10-CM

## 2014-12-01 MED ORDER — CYANOCOBALAMIN 1000 MCG/ML IJ SOLN
1000.0000 ug | Freq: Once | INTRAMUSCULAR | Status: AC
  Administered 2014-12-01: 1000 ug via INTRAMUSCULAR

## 2014-12-09 ENCOUNTER — Ambulatory Visit (INDEPENDENT_AMBULATORY_CARE_PROVIDER_SITE_OTHER): Payer: Commercial Managed Care - HMO | Admitting: Family Medicine

## 2014-12-09 ENCOUNTER — Encounter: Payer: Self-pay | Admitting: Family Medicine

## 2014-12-09 VITALS — BP 136/78 | HR 88 | Temp 98.4°F | Resp 22 | Wt 238.0 lb

## 2014-12-09 DIAGNOSIS — R0989 Other specified symptoms and signs involving the circulatory and respiratory systems: Secondary | ICD-10-CM | POA: Diagnosis not present

## 2014-12-09 DIAGNOSIS — I10 Essential (primary) hypertension: Secondary | ICD-10-CM

## 2014-12-09 DIAGNOSIS — Z23 Encounter for immunization: Secondary | ICD-10-CM

## 2014-12-09 LAB — COMPLETE METABOLIC PANEL WITH GFR
ALK PHOS: 61 U/L (ref 39–117)
ALT: 21 U/L (ref 0–53)
AST: 24 U/L (ref 0–37)
Albumin: 4.2 g/dL (ref 3.5–5.2)
BUN: 11 mg/dL (ref 6–23)
CALCIUM: 9.3 mg/dL (ref 8.4–10.5)
CO2: 26 mEq/L (ref 19–32)
CREATININE: 0.8 mg/dL (ref 0.50–1.35)
Chloride: 100 mEq/L (ref 96–112)
GFR, Est African American: >89 mL/min
GFR, Est Non African American: 84 mL/min
GLUCOSE: 106 mg/dL — AB (ref 70–99)
POTASSIUM: 3.9 meq/L (ref 3.5–5.3)
SODIUM: 135 meq/L (ref 135–145)
Total Bilirubin: 0.9 mg/dL (ref 0.2–1.2)
Total Protein: 6.3 g/dL (ref 6.0–8.3)

## 2014-12-09 LAB — LIPID PANEL
Cholesterol: 185 mg/dL (ref 0–200)
HDL: 72 mg/dL (ref >=40)
LDL Cholesterol: 100 mg/dL — ABNORMAL HIGH (ref 0–99)
TRIGLYCERIDES: 65 mg/dL (ref <150)
Total CHOL/HDL Ratio: 2.6 Ratio
VLDL: 13 mg/dL (ref 0–40)

## 2014-12-09 LAB — CBC WITH DIFFERENTIAL/PLATELET
BASOS ABS: 0 10*3/uL (ref 0.0–0.1)
BASOS PCT: 0 % (ref 0–1)
EOS ABS: 0.1 10*3/uL (ref 0.0–0.7)
Eosinophils Relative: 1 % (ref 0–5)
HEMATOCRIT: 40.4 % (ref 39.0–52.0)
Hemoglobin: 13.6 g/dL (ref 13.0–17.0)
Lymphocytes Relative: 16 % (ref 12–46)
Lymphs Abs: 1.4 10*3/uL (ref 0.7–4.0)
MCH: 28.4 pg (ref 26.0–34.0)
MCHC: 33.7 g/dL (ref 30.0–36.0)
MCV: 84.3 fL (ref 78.0–100.0)
MONO ABS: 0.6 10*3/uL (ref 0.1–1.0)
MONOS PCT: 7 % (ref 3–12)
MPV: 10.8 fL (ref 8.6–12.4)
Neutro Abs: 6.6 10*3/uL (ref 1.7–7.7)
Neutrophils Relative %: 76 % (ref 43–77)
Platelets: 174 10*3/uL (ref 150–400)
RBC: 4.79 MIL/uL (ref 4.22–5.81)
RDW: 14.4 % (ref 11.5–15.5)
WBC: 8.7 10*3/uL (ref 4.0–10.5)

## 2014-12-09 NOTE — Addendum Note (Signed)
Addended by: Shary Decamp B on: 12/09/2014 11:01 AM   Modules accepted: Orders

## 2014-12-09 NOTE — Progress Notes (Signed)
Subjective:    Patient ID: Austin Mesi., male    DOB: May 04, 1932, 81 y.o.   MRN: [id number]  HPI  Has a long-standing history of essential hypertension his blood pressure has been well controlled recently. He states his home blood pressures are in the range of 130-140/70-80. He denies any chest pain. He does have some mild dyspnea on exertion but he denies any angina. He denies any cough or pleurisy. He denies any orthopnea or paroxysmal nocturnal dyspnea. He denies any headaches .  On examination today he has diminished left carotid pulses and a faint left carotid bruit. It is difficult to auscultate any pulse in his left carotid artery. I am concerned there may be a blockage  Past Medical History  Diagnosis Date  . Hypertension   . History of BPH   . Visual impairment   . Hemorrhoids   . Diverticulosis   . Gastritis   . Basal cell carcinoma   . Vitamin D deficiency   . Colon polyps     last colonoscopy 2015 (tubular adenoma)  . Pernicious anemia   . Exertional dyspnea     "whenever I'm mowing or something"  . Rectal bleeding 02/15/12  . Arthritis     "little bit; left knee"  . Impaired vision   . PVC (premature ventricular contraction)   . H/O: GI bleed     Recurrent   Past Surgical History  Procedure Laterality Date  . Inguinal hernia repair  1958; ~ 2002    left; right   Current Outpatient Prescriptions on File Prior to Visit  Medication Sig Dispense Refill  . ALPRAZolam (NIRAVAM) 0.5 MG dissolvable tablet Take 1 tablet (0.5 mg total) by mouth daily as needed for anxiety. 15 tablet 0  . amLODipine (NORVASC) 10 MG tablet TAKE 1 TABLET BY MOUTH DAILY 90 tablet 3  . aspirin 81 MG tablet Take 81 mg by mouth daily.    . benazepril (LOTENSIN) 40 MG tablet TAKE 1 TABLET BY MOUTH DAILY 90 tablet 1  . benazepril (LOTENSIN) 40 MG tablet TAKE 1 TABLET BY MOUTH DAILY 90 tablet 3  . cloNIDine (CATAPRES) 0.1 MG tablet TAKE 1 TABLET BY MOUTH AT BEDTIME. 30 tablet 11  .  doxazosin (CARDURA) 4 MG tablet TAKE 1 TABLET EVERY DAY 30 tablet 5  . doxazosin (CARDURA) 4 MG tablet TAKE 1 TABLET EVERY DAY 30 tablet 5  . ferrous sulfate 325 (65 FE) MG tablet Take 325 mg by mouth daily with breakfast.    . hydrochlorothiazide (HYDRODIURIL) 25 MG tablet TAKE 1 TABLET (25 MG TOTAL) BY MOUTH DAILY. 90 tablet 4  . Multiple Vitamin (MULTIVITAMIN WITH MINERALS) TABS Take 1 tablet by mouth daily.    . multivitamin-lutein (OCUVITE-LUTEIN) CAPS Take 1 capsule by mouth daily.    Marland Kitchen oxaprozin (DAYPRO) 600 MG tablet Take 1 tablet (600 mg total) by mouth daily. 30 tablet 1   No current facility-administered medications on file prior to visit.   No Known Allergies History   Social History  . Marital Status: Widowed    Spouse Name: N/A  . Number of Children: N/A  . Years of Education: N/A   Occupational History  . retired    Social History Main Topics  . Smoking status: Never Smoker   . Smokeless tobacco: Current User    Types: Snuff     Comment: 02/15/12 "still use snuff @ times; not daily"  . Alcohol Use: No  . Drug Use: No  .  Sexual Activity: No     Comment: widower, lives with granddaughter and great grandchildren.   Other Topics Concern  . Not on file   Social History Narrative   Lives with grand daughter     Review of Systems  All other systems reviewed and are negative.      Objective:   Physical Exam  Constitutional: He appears well-developed and well-nourished. No distress.  Neck: Neck supple. No thyromegaly present.  Cardiovascular: Normal rate, regular rhythm, normal heart sounds and intact distal pulses.  Exam reveals no gallop and no friction rub.   No murmur heard. Pulmonary/Chest: Effort normal and breath sounds normal. No respiratory distress. He has no wheezes. He has no rales. He exhibits no tenderness.  Abdominal: Soft. Bowel sounds are normal. He exhibits no distension and no mass. There is no tenderness. There is no rebound and no  guarding.  Musculoskeletal: He exhibits no edema.  Lymphadenopathy:    He has no cervical adenopathy.  Skin: He is not diaphoretic.  Vitals reviewed.         Assessment & Plan:  Benign essential HTN - Plan: CBC with Differential/Platelet, COMPLETE METABOLIC PANEL WITH GFR, Lipid panel  Left carotid bruit - Plan: Carotid  Patient's blood pressure is excellent. I will check a CBC, CMP, and fasting lipid panel today to assess his kidney function, his blood sugar, and his cholesterol. Goal LDL cholesterol is less than 130 given the left carotid bruit and diminished pulses in the left carotid artery, I will obtain carotid Doppler ultrasound to evaluate for carotid artery stenosis.Marland Kitchen

## 2014-12-11 ENCOUNTER — Telehealth: Payer: Self-pay | Admitting: Family Medicine

## 2014-12-11 ENCOUNTER — Encounter: Payer: Self-pay | Admitting: Family Medicine

## 2014-12-11 NOTE — Telephone Encounter (Signed)
Patient aware of results.

## 2014-12-11 NOTE — Telephone Encounter (Signed)
Patient calling for test results [phone]

## 2014-12-17 ENCOUNTER — Encounter (HOSPITAL_COMMUNITY): Payer: Self-pay

## 2014-12-22 ENCOUNTER — Ambulatory Visit (HOSPITAL_COMMUNITY): Payer: Commercial Managed Care - HMO | Attending: Cardiovascular Disease

## 2014-12-22 DIAGNOSIS — I6523 Occlusion and stenosis of bilateral carotid arteries: Secondary | ICD-10-CM | POA: Diagnosis not present

## 2014-12-22 DIAGNOSIS — R0989 Other specified symptoms and signs involving the circulatory and respiratory systems: Secondary | ICD-10-CM

## 2015-01-01 ENCOUNTER — Other Ambulatory Visit: Payer: Self-pay | Admitting: Family Medicine

## 2015-01-06 ENCOUNTER — Ambulatory Visit (INDEPENDENT_AMBULATORY_CARE_PROVIDER_SITE_OTHER): Payer: Commercial Managed Care - HMO | Admitting: *Deleted

## 2015-01-06 DIAGNOSIS — E538 Deficiency of other specified B group vitamins: Secondary | ICD-10-CM

## 2015-01-06 MED ORDER — CYANOCOBALAMIN 1000 MCG/ML IJ SOLN
1000.0000 ug | Freq: Once | INTRAMUSCULAR | Status: AC
  Administered 2015-01-06: 1000 ug via INTRAMUSCULAR

## 2015-02-06 ENCOUNTER — Ambulatory Visit (INDEPENDENT_AMBULATORY_CARE_PROVIDER_SITE_OTHER): Payer: Commercial Managed Care - HMO | Admitting: *Deleted

## 2015-02-06 DIAGNOSIS — E538 Deficiency of other specified B group vitamins: Secondary | ICD-10-CM

## 2015-02-06 MED ORDER — CYANOCOBALAMIN 1000 MCG/ML IJ SOLN
1000.0000 ug | Freq: Once | INTRAMUSCULAR | Status: AC
  Administered 2015-02-06: 1000 ug via INTRAMUSCULAR

## 2015-03-10 ENCOUNTER — Ambulatory Visit (INDEPENDENT_AMBULATORY_CARE_PROVIDER_SITE_OTHER): Payer: Commercial Managed Care - HMO | Admitting: Family Medicine

## 2015-03-10 DIAGNOSIS — E538 Deficiency of other specified B group vitamins: Secondary | ICD-10-CM

## 2015-03-10 MED ORDER — CYANOCOBALAMIN 1000 MCG/ML IJ SOLN
1000.0000 ug | Freq: Once | INTRAMUSCULAR | Status: AC
  Administered 2015-03-10: 1000 ug via INTRAMUSCULAR

## 2015-04-06 ENCOUNTER — Encounter: Payer: Self-pay | Admitting: Family Medicine

## 2015-04-06 ENCOUNTER — Ambulatory Visit (INDEPENDENT_AMBULATORY_CARE_PROVIDER_SITE_OTHER): Payer: Commercial Managed Care - HMO | Admitting: Family Medicine

## 2015-04-06 VITALS — BP 120/74 | HR 60 | Temp 98.3°F | Resp 20 | Ht 70.0 in | Wt 248.0 lb

## 2015-04-06 DIAGNOSIS — Z0000 Encounter for general adult medical examination without abnormal findings: Secondary | ICD-10-CM | POA: Diagnosis not present

## 2015-04-06 DIAGNOSIS — Z23 Encounter for immunization: Secondary | ICD-10-CM

## 2015-04-06 NOTE — Addendum Note (Signed)
Addended by: Shary Decamp B on: 04/06/2015 10:52 AM   Modules accepted: Orders

## 2015-04-06 NOTE — Progress Notes (Signed)
Subjective:    Patient ID: Austin Mesi., male    DOB: 03-07-1932, 81 y.o.   MRN: [id number]  HPI   Patient is here today for complete physical exam. He has no concerns.  Patient's last colonoscopy was performed 2015.  They recommended he have a repeat colonoscopy in 3 years. He denies any signs or symptoms of lower GI bleeding this year. He does have a history of such. He is due for a prostate exam for screening for prostate cancer. Patient's Pneumovax and Prevnar are up to date. His tetanus shot is up-to-date. He is due for his annual flu shot.   Patient also has a very thick dystrophic toenails on his right great toe which is curving downward and growing into the skin. He is requesting that I trim this.  Most recent lab work is listed below: No visits with results within 1 Month(s) from this visit. Latest known visit with results is:  Office Visit on 12/09/2014  Component Date Value Ref Range Status  . WBC 12/09/2014 8.7  4.0 - 10.5 K/uL Final  . RBC 12/09/2014 4.79  4.22 - 5.81 MIL/uL Final  . Hemoglobin 12/09/2014 13.6  13.0 - 17.0 g/dL Final  . HCT 12/09/2014 40.4  39.0 - 52.0 % Final  . MCV 12/09/2014 84.3  78.0 - 100.0 fL Final  . MCH 12/09/2014 28.4  26.0 - 34.0 pg Final  . MCHC 12/09/2014 33.7  30.0 - 36.0 g/dL Final  . RDW 12/09/2014 14.4  11.5 - 15.5 % Final  . Platelets 12/09/2014 174  150 - 400 K/uL Final  . MPV 12/09/2014 10.8  8.6 - 12.4 fL Final  . Neutrophils Relative % 12/09/2014 76  43 - 77 % Final  . Neutro Abs 12/09/2014 6.6  1.7 - 7.7 K/uL Final  . Lymphocytes Relative 12/09/2014 16  12 - 46 % Final  . Lymphs Abs 12/09/2014 1.4  0.7 - 4.0 K/uL Final  . Monocytes Relative 12/09/2014 7  3 - 12 % Final  . Monocytes Absolute 12/09/2014 0.6  0.1 - 1.0 K/uL Final  . Eosinophils Relative 12/09/2014 1  0 - 5 % Final  . Eosinophils Absolute 12/09/2014 0.1  0.0 - 0.7 K/uL Final  . Basophils Relative 12/09/2014 0  0 - 1 % Final  . Basophils Absolute 12/09/2014 0.0   0.0 - 0.1 K/uL Final  . Smear Review 12/09/2014 Criteria for review not met   Final  . Sodium 12/09/2014 135  135 - 145 mEq/L Final  . Potassium 12/09/2014 3.9  3.5 - 5.3 mEq/L Final  . Chloride 12/09/2014 100  96 - 112 mEq/L Final  . CO2 12/09/2014 26  19 - 32 mEq/L Final  . Glucose, Bld 12/09/2014 106* 70 - 99 mg/dL Final  . BUN 12/09/2014 11  6 - 23 mg/dL Final  . Creat 12/09/2014 0.80  0.50 - 1.35 mg/dL Final  . Total Bilirubin 12/09/2014 0.9  0.2 - 1.2 mg/dL Final  . Alkaline Phosphatase 12/09/2014 61  39 - 117 U/L Final  . AST 12/09/2014 24  0 - 37 U/L Final  . ALT 12/09/2014 21  0 - 53 U/L Final  . Total Protein 12/09/2014 6.3  6.0 - 8.3 g/dL Final  . Albumin 12/09/2014 4.2  3.5 - 5.2 g/dL Final  . Calcium 12/09/2014 9.3  8.4 - 10.5 mg/dL Final  . GFR, Est African American 12/09/2014 >89   Final  . GFR, Est Non African American 12/09/2014 84  Final   Comment:   The estimated GFR is a calculation valid for adults (>=101 years old) that uses the CKD-EPI algorithm to adjust for age and sex. It is   not to be used for children, pregnant women, hospitalized patients,    patients on dialysis, or with rapidly changing kidney function. According to the NKDEP, eGFR >89 is normal, 60-89 shows mild impairment, 30-59 shows moderate impairment, 15-29 shows severe impairment and <15 is ESRD.     Marland Kitchen Cholesterol 12/09/2014 185  0 - 200 mg/dL Final   Comment: ATP III Classification:       < 200        mg/dL        Desirable      200 - 239     mg/dL        Borderline High      >= 240        mg/dL        High     . Triglycerides 12/09/2014 65  <150 mg/dL Final  . HDL 12/09/2014 72  >=40 mg/dL Final   ** Please note change in reference range(s). **  . Total CHOL/HDL Ratio 12/09/2014 2.6   Final  . VLDL 12/09/2014 13  0 - 40 mg/dL Final  . LDL Cholesterol 12/09/2014 100* 0 - 99 mg/dL Final   Comment:   Total Cholesterol/HDL Ratio:CHD Risk                        Coronary Heart Disease  Risk Table                                        Men       Women          1/2 Average Risk              3.4        3.3              Average Risk              5.0        4.4           2X Average Risk              9.6        7.1           3X Average Risk             23.4       11.0 Use the calculated Patient Ratio above and the CHD Risk table  to determine the patient's CHD Risk. ATP III Classification (LDL):       < 100        mg/dL         Optimal      100 - 129     mg/dL         Near or Above Optimal      130 - 159     mg/dL         Borderline High      160 - 189     mg/dL         High       > 190        mg/dL         Very High  Past Medical History  Diagnosis Date  . Hypertension   . History of BPH   . Visual impairment   . Hemorrhoids   . Diverticulosis   . Gastritis   . Basal cell carcinoma   . Vitamin D deficiency   . Colon polyps     last colonoscopy 2015 (tubular adenoma)  . Pernicious anemia   . Exertional dyspnea     "whenever I'm mowing or something"  . Rectal bleeding 02/15/12  . Arthritis     "little bit; left knee"  . Impaired vision   . PVC (premature ventricular contraction)   . H/O: GI bleed     Recurrent   Past Surgical History  Procedure Laterality Date  . Inguinal hernia repair  1958; ~ 2002    left; right   Current Outpatient Prescriptions on File Prior to Visit  Medication Sig Dispense Refill  . ALPRAZolam (NIRAVAM) 0.5 MG dissolvable tablet Take 1 tablet (0.5 mg total) by mouth daily as needed for anxiety. 15 tablet 0  . amLODipine (NORVASC) 10 MG tablet TAKE 1 TABLET BY MOUTH DAILY 90 tablet 3  . aspirin 81 MG tablet Take 81 mg by mouth daily.    . benazepril (LOTENSIN) 40 MG tablet TAKE 1 TABLET BY MOUTH DAILY 90 tablet 1  . cloNIDine (CATAPRES) 0.1 MG tablet TAKE 1 TABLET BY MOUTH AT BEDTIME. 30 tablet 11  . doxazosin (CARDURA) 4 MG tablet TAKE 1 TABLET EVERY DAY 30 tablet 5  . doxazosin (CARDURA) 4 MG tablet TAKE 1 TABLET EVERY DAY  30 tablet 5  . ferrous sulfate 325 (65 FE) MG tablet Take 325 mg by mouth daily with breakfast.    . hydrochlorothiazide (HYDRODIURIL) 25 MG tablet TAKE 1 TABLET (25 MG TOTAL) BY MOUTH DAILY. 90 tablet 4  . Multiple Vitamin (MULTIVITAMIN WITH MINERALS) TABS Take 1 tablet by mouth daily.    . multivitamin-lutein (OCUVITE-LUTEIN) CAPS Take 1 capsule by mouth daily.    Marland Kitchen oxaprozin (DAYPRO) 600 MG tablet TAKE 1 TABLET BY MOUTH EVERY DAY 30 tablet 11   No current facility-administered medications on file prior to visit.   No Known Allergies Social History   Social History  . Marital Status: Widowed    Spouse Name: N/A  . Number of Children: N/A  . Years of Education: N/A   Occupational History  . retired    Social History Main Topics  . Smoking status: Never Smoker   . Smokeless tobacco: Current User    Types: Snuff     Comment: 02/15/12 "still use snuff @ times; not daily"  . Alcohol Use: No  . Drug Use: No  . Sexual Activity: No     Comment: widower, lives with granddaughter and great grandchildren.   Other Topics Concern  . Not on file   Social History Narrative   Lives with grand daughter   No family history on file.   Review of Systems  All other systems reviewed and are negative.      Objective:   Physical Exam  Constitutional: He is oriented to person, place, and time. He appears well-developed and well-nourished. No distress.  HENT:  Head: Normocephalic and atraumatic.  Right Ear: External ear normal.  Left Ear: External ear normal.  Nose: Nose normal.  Mouth/Throat: Oropharynx is clear and moist. No oropharyngeal exudate.  Eyes: Conjunctivae and EOM are normal. Pupils are equal, round, and reactive to light. Right eye exhibits no discharge. Left eye exhibits no discharge. No scleral icterus.  Neck: Normal range of motion. Neck supple. No JVD present. No tracheal deviation present. No thyromegaly present.  Cardiovascular: Normal rate, regular rhythm, normal  heart sounds and intact distal pulses.  Exam reveals no gallop and no friction rub.   No murmur heard. Pulmonary/Chest: Effort normal and breath sounds normal. No stridor. No respiratory distress. He has no wheezes. He has no rales. He exhibits no tenderness.  Abdominal: Soft. Bowel sounds are normal. He exhibits no distension and no mass. There is no tenderness. There is no rebound and no guarding.  Musculoskeletal: Normal range of motion. He exhibits no edema or tenderness.  Lymphadenopathy:    He has no cervical adenopathy.  Neurological: He is alert and oriented to person, place, and time. He has normal reflexes. No cranial nerve deficit. He exhibits normal muscle tone. Coordination normal.  Skin: Skin is warm. No rash noted. He is not diaphoretic. No erythema. No pallor.  Psychiatric: He has a normal mood and affect. His behavior is normal. Judgment and thought content normal.  Vitals reviewed.  Patient has bilateral cataracts       Assessment & Plan:  Routine general medical examination at a health care facility  Physical exam is normal immunizations are up-to-date. The patient received his flu shot today. He is due for a tetanus shot as well as the shingles vaccine but he declines these today due to cost. All of his lab work except a PSA was just checked in May and was excellent. Therefore I'll make no changes at the present time. Given his age, I recommended against checking a rectal exam as well as a PSA. The patient is comfortable with this. Using a pair of side cutters, I was able to trim over 50% of the thick toenail off. This should provide the patient some relief at least for a few months.

## 2015-04-21 ENCOUNTER — Ambulatory Visit (INDEPENDENT_AMBULATORY_CARE_PROVIDER_SITE_OTHER): Payer: Commercial Managed Care - HMO | Admitting: *Deleted

## 2015-04-21 DIAGNOSIS — E538 Deficiency of other specified B group vitamins: Secondary | ICD-10-CM

## 2015-04-21 MED ORDER — CYANOCOBALAMIN 1000 MCG/ML IJ SOLN
1000.0000 ug | Freq: Once | INTRAMUSCULAR | Status: AC
  Administered 2015-04-21: 1000 ug via INTRAMUSCULAR

## 2015-04-27 ENCOUNTER — Other Ambulatory Visit: Payer: Self-pay | Admitting: Family Medicine

## 2015-05-25 ENCOUNTER — Ambulatory Visit (INDEPENDENT_AMBULATORY_CARE_PROVIDER_SITE_OTHER): Payer: Commercial Managed Care - HMO | Admitting: *Deleted

## 2015-05-25 DIAGNOSIS — E538 Deficiency of other specified B group vitamins: Secondary | ICD-10-CM | POA: Diagnosis not present

## 2015-05-25 MED ORDER — CYANOCOBALAMIN 1000 MCG/ML IJ SOLN
1000.0000 ug | Freq: Once | INTRAMUSCULAR | Status: AC
  Administered 2015-05-25: 1000 ug via INTRAMUSCULAR

## 2015-05-25 NOTE — Patient Instructions (Signed)
Return to office in 4 weeks for the next injection.

## 2015-05-26 ENCOUNTER — Ambulatory Visit: Payer: Commercial Managed Care - HMO

## 2015-06-03 ENCOUNTER — Other Ambulatory Visit: Payer: Self-pay | Admitting: Family Medicine

## 2015-06-03 NOTE — Telephone Encounter (Signed)
Medication refilled per protocol. 

## 2015-06-04 NOTE — Telephone Encounter (Signed)
Medication refilled per protocol. 

## 2015-06-29 ENCOUNTER — Ambulatory Visit (INDEPENDENT_AMBULATORY_CARE_PROVIDER_SITE_OTHER): Payer: Commercial Managed Care - HMO | Admitting: *Deleted

## 2015-06-29 DIAGNOSIS — Z23 Encounter for immunization: Secondary | ICD-10-CM | POA: Diagnosis not present

## 2015-06-29 DIAGNOSIS — E538 Deficiency of other specified B group vitamins: Secondary | ICD-10-CM

## 2015-06-29 MED ORDER — CYANOCOBALAMIN 1000 MCG/ML IJ SOLN
1000.0000 ug | Freq: Once | INTRAMUSCULAR | Status: AC
  Administered 2015-06-29: 1000 ug via INTRAMUSCULAR

## 2015-07-09 ENCOUNTER — Other Ambulatory Visit: Payer: Self-pay | Admitting: *Deleted

## 2015-07-09 MED ORDER — BENAZEPRIL HCL 40 MG PO TABS: 40.0000 mg | ORAL_TABLET | Freq: Every day | ORAL | Status: DC

## 2015-07-09 MED ORDER — HYDROCHLOROTHIAZIDE 25 MG PO TABS: 25.0000 mg | ORAL_TABLET | Freq: Every day | ORAL | Status: DC

## 2015-07-09 MED ORDER — AMLODIPINE BESYLATE 10 MG PO TABS: 10.0000 mg | ORAL_TABLET | Freq: Every day | ORAL | Status: DC

## 2015-07-09 MED ORDER — DOXAZOSIN MESYLATE 4 MG PO TABS: 4.0000 mg | ORAL_TABLET | Freq: Every day | ORAL | Status: DC

## 2015-07-09 MED ORDER — OXAPROZIN 600 MG PO TABS: 600.0000 mg | ORAL_TABLET | Freq: Every day | ORAL | Status: DC

## 2015-07-09 MED ORDER — CLONIDINE HCL 0.1 MG PO TABS: 0.1000 mg | ORAL_TABLET | Freq: Every day | ORAL | Status: DC

## 2015-07-09 NOTE — Telephone Encounter (Signed)
Received fax requesting refill on routine mediations to [place] with 90 day supply.   Refill appropriate and filled per protocol.

## 2015-08-24 ENCOUNTER — Encounter: Payer: Self-pay | Admitting: Family Medicine

## 2015-08-24 ENCOUNTER — Ambulatory Visit (INDEPENDENT_AMBULATORY_CARE_PROVIDER_SITE_OTHER): Payer: Commercial Managed Care - HMO | Admitting: Family Medicine

## 2015-08-24 VITALS — BP 140/80 | HR 62 | Temp 97.9°F | Resp 16 | Wt 253.0 lb

## 2015-08-24 DIAGNOSIS — I10 Essential (primary) hypertension: Secondary | ICD-10-CM | POA: Diagnosis not present

## 2015-08-24 DIAGNOSIS — R002 Palpitations: Secondary | ICD-10-CM

## 2015-08-24 DIAGNOSIS — I4891 Unspecified atrial fibrillation: Secondary | ICD-10-CM

## 2015-08-24 DIAGNOSIS — I447 Left bundle-branch block, unspecified: Secondary | ICD-10-CM

## 2015-08-24 DIAGNOSIS — E538 Deficiency of other specified B group vitamins: Secondary | ICD-10-CM

## 2015-08-24 DIAGNOSIS — E539 Vitamin B deficiency, unspecified: Secondary | ICD-10-CM

## 2015-08-24 LAB — CBC WITH DIFFERENTIAL/PLATELET
BASOS ABS: 0 10*3/uL (ref 0.0–0.1)
BASOS PCT: 0 % (ref 0–1)
EOS ABS: 0.1 10*3/uL (ref 0.0–0.7)
EOS PCT: 1 % (ref 0–5)
HCT: 40.7 % (ref 39.0–52.0)
Hemoglobin: 13.5 g/dL (ref 13.0–17.0)
Lymphocytes Relative: 19 % (ref 12–46)
Lymphs Abs: 1.7 10*3/uL (ref 0.7–4.0)
MCH: 28.4 pg (ref 26.0–34.0)
MCHC: 33.2 g/dL (ref 30.0–36.0)
MCV: 85.7 fL (ref 78.0–100.0)
MPV: 10.7 fL (ref 8.6–12.4)
Monocytes Absolute: 0.6 10*3/uL (ref 0.1–1.0)
Monocytes Relative: 7 % (ref 3–12)
Neutro Abs: 6.6 10*3/uL (ref 1.7–7.7)
Neutrophils Relative %: 73 % (ref 43–77)
PLATELETS: 197 10*3/uL (ref 150–400)
RBC: 4.75 MIL/uL (ref 4.22–5.81)
RDW: 14 % (ref 11.5–15.5)
WBC: 9 10*3/uL (ref 4.0–10.5)

## 2015-08-24 LAB — COMPLETE METABOLIC PANEL WITHOUT GFR
ALT: 17 U/L (ref 9–46)
AST: 19 U/L (ref 10–35)
Albumin: 4.1 g/dL (ref 3.6–5.1)
Alkaline Phosphatase: 67 U/L (ref 40–115)
BUN: 9 mg/dL (ref 7–25)
CO2: 27 mmol/L (ref 20–31)
Calcium: 9.4 mg/dL (ref 8.6–10.3)
Chloride: 99 mmol/L (ref 98–110)
Creat: 0.86 mg/dL (ref 0.70–1.11)
GFR, Est African American: >89 mL/min (ref >=60)
GFR, Est Non African American: 81 mL/min (ref >=60)
Glucose, Bld: 101 mg/dL — ABNORMAL HIGH (ref 70–99)
Potassium: 3.8 mmol/L (ref 3.5–5.3)
Sodium: 137 mmol/L (ref 135–146)
Total Bilirubin: 0.9 mg/dL (ref 0.2–1.2)
Total Protein: 6.6 g/dL (ref 6.1–8.1)

## 2015-08-24 LAB — LIPID PANEL
CHOLESTEROL: 183 mg/dL (ref 125–200)
HDL: 63 mg/dL (ref >=40)
LDL CALC: 104 mg/dL (ref <130)
Total CHOL/HDL Ratio: 2.9 Ratio (ref <=5.0)
Triglycerides: 78 mg/dL (ref <150)
VLDL: 16 mg/dL (ref <30)

## 2015-08-24 LAB — TSH: TSH: 1.67 mIU/L (ref 0.40–4.50)

## 2015-08-24 MED ORDER — METOPROLOL SUCCINATE ER 25 MG PO TB24: 25.0000 mg | ORAL_TABLET | Freq: Every day | ORAL | Status: DC

## 2015-08-24 MED ORDER — CYANOCOBALAMIN 1000 MCG/ML IJ SOLN
1000.0000 ug | Freq: Once | INTRAMUSCULAR | Status: AC
  Administered 2015-08-24: 1000 ug via INTRAMUSCULAR

## 2015-08-24 NOTE — Progress Notes (Signed)
Subjective:    Patient ID: Austin Mesi., male    DOB: Mar 01, 1932, 82 y.o.   MRN: [id number]  HPI The patient is here today for follow-up of his hypertension. His blood pressure is adequately controlled today at 140/80. He does complain of some mild orthostatic dizziness with standing. He denies any syncope. However he has been having occasional intermittent sharp sudden left-sided chest pain. The pain only last a second and then resolve spontaneously. He does report dyspnea on exertion but this is been chronic for the last year. He denies any recent changes in his shortness of breath. However on examination today he has an irregularly irregular heartbeat. He is unable to feel this. Past Medical History  Diagnosis Date  . Hypertension   . History of BPH   . Visual impairment   . Hemorrhoids   . Diverticulosis   . Gastritis   . Basal cell carcinoma   . Vitamin D deficiency   . Colon polyps     last colonoscopy 2015 (tubular adenoma)  . Pernicious anemia   . Exertional dyspnea     "whenever I'm mowing or something"  . Rectal bleeding 02/15/12  . Arthritis     "little bit; left knee"  . Impaired vision   . PVC (premature ventricular contraction)   . H/O: GI bleed     Recurrent   Past Surgical History  Procedure Laterality Date  . Inguinal hernia repair  1958; ~ 2002    left; right   Current Outpatient Prescriptions on File Prior to Visit  Medication Sig Dispense Refill  . amLODipine (NORVASC) 10 MG tablet Take 1 tablet (10 mg total) by mouth daily. 90 tablet 3  . aspirin 81 MG tablet Take 81 mg by mouth daily.    . benazepril (LOTENSIN) 40 MG tablet Take 1 tablet (40 mg total) by mouth daily. 90 tablet 3  . cloNIDine (CATAPRES) 0.1 MG tablet Take 1 tablet (0.1 mg total) by mouth at bedtime. 90 tablet 3  . doxazosin (CARDURA) 4 MG tablet Take 1 tablet (4 mg total) by mouth daily. 90 tablet 3  . ferrous sulfate 325 (65 FE) MG tablet Take 325 mg by mouth daily with  breakfast.    . hydrochlorothiazide (HYDRODIURIL) 25 MG tablet Take 1 tablet (25 mg total) by mouth daily. 90 tablet 3  . Multiple Vitamin (MULTIVITAMIN WITH MINERALS) TABS Take 1 tablet by mouth daily.    . multivitamin-lutein (OCUVITE-LUTEIN) CAPS Take 1 capsule by mouth daily.    Marland Kitchen oxaprozin (DAYPRO) 600 MG tablet Take 1 tablet (600 mg total) by mouth daily. 90 tablet 3  . ALPRAZolam (NIRAVAM) 0.5 MG dissolvable tablet Take 1 tablet (0.5 mg total) by mouth daily as needed for anxiety. (Patient not taking: Reported on 08/24/2015) 15 tablet 0   No current facility-administered medications on file prior to visit.   No Known Allergies Social History   Social History  . Marital Status: Widowed    Spouse Name: N/A  . Number of Children: N/A  . Years of Education: N/A   Occupational History  . retired    Social History Main Topics  . Smoking status: Never Smoker   . Smokeless tobacco: Current User    Types: Snuff     Comment: 02/15/12 "still use snuff @ times; not daily"  . Alcohol Use: No  . Drug Use: No  . Sexual Activity: No     Comment: widower, lives with granddaughter and great grandchildren.  Other Topics Concern  . Not on file   Social History Narrative   Lives with grand daughter      Review of Systems  All other systems reviewed and are negative.      Objective:   Physical Exam  Constitutional: He is oriented to person, place, and time. He appears well-developed and well-nourished.  Neck: Neck supple. No JVD present. No thyromegaly present.  Cardiovascular: Normal rate.  An irregularly irregular rhythm present.  Pulmonary/Chest: Effort normal and breath sounds normal. No respiratory distress. He has no wheezes. He has no rales.  Abdominal: Soft. Bowel sounds are normal. He exhibits no distension and no mass. There is no tenderness. There is no rebound and no guarding.  Musculoskeletal: He exhibits no edema.  Lymphadenopathy:    He has no cervical adenopathy.    Neurological: He is alert and oriented to person, place, and time. He has normal reflexes. He displays normal reflexes. No cranial nerve deficit. He exhibits normal muscle tone. Coordination normal.  Psychiatric: He has a normal mood and affect. His behavior is normal.  Vitals reviewed.         Assessment & Plan:  Palpitations - Plan: EKG 12-Lead, TSH  B12 deficiency  Essential hypertension - Plan: CBC with Differential/Platelet, COMPLETE METABOLIC PANEL WITH GFR, Lipid panel  EKG shows left bundle branch block and possible afib although difficult to ascertain on this ekg if it is A. Fib versus occasional PVCs with a background left bundle branch block .  I will check TSh and consult cardiology for ehco and possible stress test given his sob and atypical chest pain.  Meanwhile start xarelto 20 mg poqday and add toprol xl 25 mg poqday and stop aspirin and nsaids.

## 2015-08-24 NOTE — Addendum Note (Signed)
Addended by: Daylene Posey T on: 08/24/2015 10:30 AM   Modules accepted: Orders

## 2015-08-25 ENCOUNTER — Encounter: Payer: Self-pay | Admitting: *Deleted

## 2015-08-28 ENCOUNTER — Telehealth: Payer: Self-pay | Admitting: *Deleted

## 2015-08-28 NOTE — Telephone Encounter (Signed)
Submitted [employer] referral thru [facility] for authorization to Dr. Argentina Donovan with authorization [id number]  Requesting provider: Flonnie Hailstone  Treating provider: Keenan Bachelor  Number of visits:6  Start Date:09/21/15  End Date:03/19/16  Dx:I44.7-Left bundle branch block,unspecified      I48.91- Unspecified atrial fibrillation

## 2015-09-01 ENCOUNTER — Telehealth: Payer: Self-pay | Admitting: Family Medicine

## 2015-09-01 NOTE — Telephone Encounter (Signed)
Called and spoke to pt and he states that he was given samples for a blood thinner and he is going to be out before he can get in to see cards. - Informed pt that I will leave him some samples up that he can pick up at his convenience.

## 2015-09-01 NOTE — Telephone Encounter (Signed)
Patient calling to speak to you regarding his blood thinner  [phone]

## 2015-09-21 ENCOUNTER — Encounter: Payer: Self-pay | Admitting: Cardiology

## 2015-09-21 ENCOUNTER — Encounter (INDEPENDENT_AMBULATORY_CARE_PROVIDER_SITE_OTHER): Payer: Commercial Managed Care - HMO

## 2015-09-21 ENCOUNTER — Ambulatory Visit (INDEPENDENT_AMBULATORY_CARE_PROVIDER_SITE_OTHER): Payer: Commercial Managed Care - HMO | Admitting: Cardiology

## 2015-09-21 VITALS — BP 150/96 | HR 110 | Ht 70.0 in | Wt 251.0 lb

## 2015-09-21 DIAGNOSIS — I481 Persistent atrial fibrillation: Secondary | ICD-10-CM | POA: Diagnosis not present

## 2015-09-21 DIAGNOSIS — I4819 Other persistent atrial fibrillation: Secondary | ICD-10-CM

## 2015-09-21 DIAGNOSIS — R011 Cardiac murmur, unspecified: Secondary | ICD-10-CM

## 2015-09-21 NOTE — Progress Notes (Signed)
Cardiology Office Note   Date:  09/21/2015   ID:  Austin Mesi., DOB January 21, 1932, MRN [id number]  PCP:  Odette Fraction, MD  Cardiologist:   Minus Breeding, MD   Chief Complaint  Patient presents with  . Atrial Fibrillation      History of Present Illness: Austin Merrithew. is a 82 y.o. male who presents for evaluation of atrial fib.  He was found to have an irregular rhythm which was correctly thought to be atrial fib.  He does not notice this.  He might notice an irregular heart rate if he takes his pulse.  However, otherwise he denies any acute cardiovascular symptoms.  The patient denies any new symptoms such as chest discomfort, neck or arm discomfort. There has been no new shortness of breath, PND or orthopnea. There have been no reported palpitations, presyncope or syncope.    Past Medical History  Diagnosis Date  . Hypertension   . History of BPH   . Visual impairment   . Hemorrhoids   . Diverticulosis   . Gastritis   . Basal cell carcinoma   . Vitamin D deficiency   . Colon polyps     last colonoscopy 2015 (tubular adenoma)  . Pernicious anemia   . Rectal bleeding 02/15/12  . Arthritis     "little bit; left knee"  . PVC (premature ventricular contraction)   . H/O: GI bleed     Recurrent    Past Surgical History  Procedure Laterality Date  . Inguinal hernia repair  1958; ~ 2002    left; right     Current Outpatient Prescriptions  Medication Sig Dispense Refill  . amLODipine (NORVASC) 10 MG tablet Take 1 tablet (10 mg total) by mouth daily. 90 tablet 3  . aspirin 81 MG tablet Take 81 mg by mouth daily.    . benazepril (LOTENSIN) 40 MG tablet Take 1 tablet (40 mg total) by mouth daily. 90 tablet 3  . cloNIDine (CATAPRES) 0.1 MG tablet Take 1 tablet (0.1 mg total) by mouth at bedtime. 90 tablet 3  . doxazosin (CARDURA) 4 MG tablet Take 1 tablet (4 mg total) by mouth daily. 90 tablet 3  . ferrous sulfate 325 (65 FE) MG tablet Take 325 mg by  mouth daily with breakfast.    . hydrochlorothiazide (HYDRODIURIL) 25 MG tablet Take 1 tablet (25 mg total) by mouth daily. 90 tablet 3  . metoprolol succinate (TOPROL-XL) 25 MG 24 hr tablet Take 1 tablet (25 mg total) by mouth daily. 90 tablet 3  . Multiple Vitamin (MULTIVITAMIN WITH MINERALS) TABS Take 1 tablet by mouth daily.    . multivitamin-lutein (OCUVITE-LUTEIN) CAPS Take 1 capsule by mouth daily.    Marland Kitchen oxaprozin (DAYPRO) 600 MG tablet Take 1 tablet (600 mg total) by mouth daily. 90 tablet 3  . rivaroxaban (XARELTO) 20 MG TABS tablet Take 20 mg by mouth daily with supper.     No current facility-administered medications for this visit.    Allergies:   Review of patient's allergies indicates no known allergies.    Social History:  The patient  reports that he has never smoked. His smokeless tobacco use includes Snuff. He reports that he does not drink alcohol or use illicit drugs.   Family History:  The patient's family history includes Cancer in his mother; Stroke (age of onset: 54) in his father.    ROS:  Please see the history of present illness.   Otherwise, review of  systems are positive for none.   All other systems are reviewed and negative.    PHYSICAL EXAM: VS:  BP 150/96 mmHg  Pulse 110  Ht 5\' 10"  (1.778 m)  Wt 251 lb (113.853 kg)  BMI 36.01 kg/m2 , BMI Body mass index is 36.01 kg/(m^2). GENERAL:  Well appearing HEENT:  Pupils equal round and reactive, fundi not visualized, oral mucosa unremarkable NECK:  No jugular venous distention, waveform within normal limits, carotid upstroke brisk and symmetric, no bruits, no thyromegaly LYMPHATICS:  No cervical, inguinal adenopathy LUNGS:  Clear to auscultation bilaterally BACK:  No CVA tenderness CHEST:  Unremarkable HEART:  PMI not displaced or sustained,S1 and S2 within normal limits, no S3, no S4, no clicks, no rubs, 3/6 apical systolic murmur at the apex and radiating to the axilla.  No diastolic murmurs ABD:  Flat,  positive bowel sounds normal in frequency in pitch, no bruits, no rebound, no guarding, no midline pulsatile mass, no hepatomegaly, no splenomegaly EXT:  2 plus pulses throughout, no edema, no cyanosis no clubbing SKIN:  No rashes no nodules NEURO:  Cranial nerves II through XII grossly intact, motor grossly intact throughout PSYCH:  Cognitively intact, oriented to person place and time    EKG:  EKG is ordered today. The ekg ordered today demonstrates atrial fib with RVR and RBBB.  Rate 111.    Recent Labs: 08/24/2015: ALT 17; BUN 9; Creat 0.86; Hemoglobin 13.5; Platelets 197; Potassium 3.8; Sodium 137; TSH 1.67    Lipid Panel    Component Value Date/Time   CHOL 183 08/24/2015 1015   TRIG 78 08/24/2015 1015   HDL 63 08/24/2015 1015   CHOLHDL 2.9 08/24/2015 1015   VLDL 16 08/24/2015 1015   LDLCALC 104 08/24/2015 1015      Wt Readings from Last 3 Encounters:  09/21/15 251 lb (113.853 kg)  08/24/15 253 lb (114.76 kg)  04/06/15 248 lb (112.492 kg)      Other studies Reviewed: Additional studies/ records that were reviewed today include: EKG and office records. Review of the above records demonstrates:  Please see elsewhere in the note.     ASSESSMENT AND PLAN:  ATRIAL FIB:  We had a long discussion about this.  I do not think that there is any indication to try to get him into NSR.  He is completely asymptomatic with this rhythm.  He tolerates anticoagulation.   This patients CHA2DS2-VASc Score and unadjusted Ischemic Stroke Rate (% per year) is equal to 3.2 % stroke rate/year from a score of 3.  He will continue with meds as listed.  I will place a 24 hour Holter to make sure that he has good rate control.  MURMUR:  I suspect MR.  I will check an echocardiogram.         Current medicines are reviewed at length with the patient today.  The patient does not have concerns regarding medicines.  The following changes have been made:  no change  Labs/ tests ordered  today include:   Orders Placed This Encounter  Procedures  . EKG 12-Lead  . ECHOCARDIOGRAM COMPLETE     Disposition:   FU with me after the above studies.     Signed, Minus Breeding, MD  09/21/2015 12:39 PM    [facility]

## 2015-09-21 NOTE — Patient Instructions (Signed)
Medication Instructions: Dr Percival Spanish recommends that you continue on your current medications as directed. Please refer to the Current Medication list given to you today.  Labwork: NONE  Testing/Procedures: 1. Echocardiogram - Your physician has requested that you have an echocardiogram. Echocardiography is a painless test that uses sound waves to create images of your heart. It provides your doctor with information about the size and shape of your heart and how well your heart's chambers and valves are working. This procedure takes approximately one hour. There are no restrictions for this procedure.  2. 24-Hour Holter - Your physician has recommended that you wear a holter monitor. Holter monitors are medical devices that record the heart's electrical activity. Doctors most often use these monitors to diagnose arrhythmias. Arrhythmias are problems with the speed or rhythm of the heartbeat. The monitor is a small, portable device. You can wear one while you do your normal daily activities. This is usually used to diagnose what is causing palpitations/syncope (passing out).  Follow-up: Dr Percival Spanish recommends that you schedule a follow-up appointment in 6 weeks.  If you need a refill on your cardiac medications before your next appointment, please call your pharmacy.

## 2015-09-22 DIAGNOSIS — R011 Cardiac murmur, unspecified: Secondary | ICD-10-CM | POA: Insufficient documentation

## 2015-09-22 DIAGNOSIS — I481 Persistent atrial fibrillation: Secondary | ICD-10-CM | POA: Diagnosis not present

## 2015-09-22 DIAGNOSIS — I4819 Other persistent atrial fibrillation: Secondary | ICD-10-CM | POA: Insufficient documentation

## 2015-09-23 ENCOUNTER — Encounter: Payer: Self-pay | Admitting: Cardiology

## 2015-09-23 NOTE — Addendum Note (Signed)
Addended by: Diana Eves on: 09/23/2015 11:31 AM   Modules accepted: Orders

## 2015-09-30 ENCOUNTER — Ambulatory Visit (INDEPENDENT_AMBULATORY_CARE_PROVIDER_SITE_OTHER): Payer: Commercial Managed Care - HMO | Admitting: Family Medicine

## 2015-09-30 DIAGNOSIS — D519 Vitamin B12 deficiency anemia, unspecified: Secondary | ICD-10-CM | POA: Diagnosis not present

## 2015-09-30 MED ORDER — CYANOCOBALAMIN 1000 MCG/ML IJ SOLN
1000.0000 ug | INTRAMUSCULAR | Status: DC
  Administered 2015-09-30 – 2016-11-25 (×10): 1000 ug via SUBCUTANEOUS

## 2015-10-06 ENCOUNTER — Ambulatory Visit (HOSPITAL_COMMUNITY): Payer: Commercial Managed Care - HMO | Attending: Cardiovascular Disease

## 2015-10-06 ENCOUNTER — Other Ambulatory Visit: Payer: Self-pay

## 2015-10-06 DIAGNOSIS — R011 Cardiac murmur, unspecified: Secondary | ICD-10-CM

## 2015-10-06 DIAGNOSIS — I071 Rheumatic tricuspid insufficiency: Secondary | ICD-10-CM | POA: Insufficient documentation

## 2015-10-06 DIAGNOSIS — I119 Hypertensive heart disease without heart failure: Secondary | ICD-10-CM | POA: Insufficient documentation

## 2015-10-06 DIAGNOSIS — I340 Nonrheumatic mitral (valve) insufficiency: Secondary | ICD-10-CM | POA: Insufficient documentation

## 2015-10-06 DIAGNOSIS — I351 Nonrheumatic aortic (valve) insufficiency: Secondary | ICD-10-CM | POA: Diagnosis not present

## 2015-10-13 ENCOUNTER — Telehealth: Payer: Self-pay | Admitting: Cardiology

## 2015-10-13 NOTE — Telephone Encounter (Signed)
New message ° ° ° ° ° °Returned a call to the nurse °

## 2015-10-13 NOTE — Telephone Encounter (Signed)
Spoke to pt about his test result

## 2015-10-18 NOTE — Progress Notes (Signed)
Cardiology Office Note   Date:  10/19/2015   ID:  Austin Mesi., DOB 04-06-1932, MRN [id number]  PCP:  Odette Fraction, MD  Cardiologist:   Minus Breeding, MD   Chief Complaint  Patient presents with  . Follow-up    some swelling in ankles. tiredness. no CP or SHOB.       History of Present Illness: Austin Graeve. is a 82 y.o. male who presents for evaluation of atrial fib.  He was found to have an irregular rhythm which was correctly thought to be atrial fib.  He does not notice this.   I had him wear a Holter monitor and he had reasonable rate control. He is now taking Xarelto and having no symptoms related to this. He doesn't have any shortness of breath, PND or orthopnea he denies any chest pressure, neck or arm discomfort. He's had no weight gain.  He has mild chronic leg edema.   Past Medical History  Diagnosis Date  . Hypertension   . History of BPH   . Visual impairment   . Hemorrhoids   . Diverticulosis   . Gastritis   . Basal cell carcinoma   . Vitamin D deficiency   . Colon polyps     last colonoscopy 2015 (tubular adenoma)  . Pernicious anemia   . Rectal bleeding 02/15/12  . Arthritis     "little bit; left knee"  . PVC (premature ventricular contraction)   . H/O: GI bleed     Recurrent    Past Surgical History  Procedure Laterality Date  . Inguinal hernia repair  1958; ~ 2002    left; right     Current Outpatient Prescriptions  Medication Sig Dispense Refill  . amLODipine (NORVASC) 10 MG tablet Take 1 tablet (10 mg total) by mouth daily. 90 tablet 3  . benazepril (LOTENSIN) 40 MG tablet Take 1 tablet (40 mg total) by mouth daily. 90 tablet 3  . cloNIDine (CATAPRES) 0.1 MG tablet Take 1 tablet (0.1 mg total) by mouth at bedtime. 90 tablet 3  . doxazosin (CARDURA) 4 MG tablet Take 1 tablet (4 mg total) by mouth daily. 90 tablet 3  . ferrous sulfate 325 (65 FE) MG tablet Take 325 mg by mouth daily with breakfast.    .  hydrochlorothiazide (HYDRODIURIL) 25 MG tablet Take 1 tablet (25 mg total) by mouth daily. 90 tablet 3  . metoprolol succinate (TOPROL-XL) 25 MG 24 hr tablet Take 1 tablet (25 mg total) by mouth daily. 90 tablet 3  . Multiple Vitamin (MULTIVITAMIN WITH MINERALS) TABS Take 1 tablet by mouth daily.    . multivitamin-lutein (OCUVITE-LUTEIN) CAPS Take 1 capsule by mouth daily.    Marland Kitchen oxaprozin (DAYPRO) 600 MG tablet Take 1 tablet (600 mg total) by mouth daily. 90 tablet 3  . rivaroxaban (XARELTO) 20 MG TABS tablet Take 20 mg by mouth daily with supper.     Current Facility-Administered Medications  Medication Dose Route Frequency Provider Last Rate Last Dose  . cyanocobalamin ((VITAMIN B-12)) injection 1,000 mcg  1,000 mcg Subcutaneous Q30 days [name], MD   1,000 mcg at 09/30/15 1110    Allergies:   Review of patient's allergies indicates no known allergies.    ROS:  Please see the history of present illness.   Otherwise, review of systems are positive for back and knee pain.   All other systems are reviewed and negative.    PHYSICAL EXAM: VS:  BP 134/70  mmHg  Pulse 84  Ht 5\' 10"  (1.778 m)  Wt 250 lb (113.399 kg)  BMI 35.87 kg/m2 , BMI Body mass index is 35.87 kg/(m^2). GENERAL:  Well appearing HEENT:  Pupils equal round and reactive, fundi not visualized, oral mucosa unremarkable NECK:  No jugular venous distention, waveform within normal limits, carotid upstroke brisk and symmetric, no bruits, no thyromegaly LUNGS:  Clear to auscultation bilaterally BACK:  No CVA tenderness CHEST:  Unremarkable HEART:  PMI not displaced or sustained,S1 and S2 within normal limits, no S3, no S4, no clicks, no rubs, 3/6 apical systolic murmur at the apex and radiating to the axilla.  No diastolic murmurs ABD:  Flat, positive bowel sounds normal in frequency in pitch, no bruits, no rebound, no guarding, no midline pulsatile mass, no hepatomegaly, no splenomegaly EXT:  2 plus pulses throughout, mild  edema, no cyanosis no clubbing SKIN:  No rashes no nodules    EKG:  EKG is not ordered today.     Recent Labs: 08/24/2015: ALT 17; BUN 9; Creat 0.86; Hemoglobin 13.5; Platelets 197; Potassium 3.8; Sodium 137; TSH 1.67    Lipid Panel    Component Value Date/Time   CHOL 183 08/24/2015 1015   TRIG 78 08/24/2015 1015   HDL 63 08/24/2015 1015   CHOLHDL 2.9 08/24/2015 1015   VLDL 16 08/24/2015 1015   LDLCALC 104 08/24/2015 1015      Wt Readings from Last 3 Encounters:  10/19/15 250 lb (113.399 kg)  09/21/15 251 lb (113.853 kg)  08/24/15 253 lb (114.76 kg)      Other studies Reviewed: Additional studies/ records that were reviewed today include: Holter and echo Review of the above records demonstrates:  Please see elsewhere in the note.     ASSESSMENT AND PLAN:  ATRIAL FIB:  He is completely asymptomatic with this rhythm.  He tolerates anticoagulation.   This patients CHA2DS2-VASc Score and unadjusted Ischemic Stroke Rate (% per year) is equal to 3.2 % stroke rate/year from a score of 3.  He will continue with meds as listed.  MURMUR:  I suspect MR.  I will  Follow this clinically and with repeat echocardiograms.  EDEMA:  He was given information about getting compression stockings.  We talked about salt and fluid restriction.   OBESITY: The patient understands the need to lose weight with diet and exercise. We have discussed specific strategies for this.   Current medicines are reviewed at length with the patient today.  The patient does not have concerns regarding medicines.  The following changes have been made:  no change  Labs/ tests ordered today include:   No orders of the defined types were placed in this encounter.     Disposition:   FU with me after the above studies.     Signed, Minus Breeding, MD  10/19/2015 9:58 AM    [facility]

## 2015-10-19 ENCOUNTER — Ambulatory Visit (INDEPENDENT_AMBULATORY_CARE_PROVIDER_SITE_OTHER): Payer: Commercial Managed Care - HMO | Admitting: Cardiology

## 2015-10-19 ENCOUNTER — Encounter: Payer: Self-pay | Admitting: Cardiology

## 2015-10-19 VITALS — BP 134/70 | HR 84 | Ht 70.0 in | Wt 250.0 lb

## 2015-10-19 DIAGNOSIS — I480 Paroxysmal atrial fibrillation: Secondary | ICD-10-CM | POA: Diagnosis not present

## 2015-10-19 NOTE — Patient Instructions (Signed)
Your physician wants you to follow-up in: 6 Months You will receive a reminder letter in the mail two months in advance. If you don't receive a letter, please call our office to schedule the follow-up appointment.  

## 2015-11-03 ENCOUNTER — Ambulatory Visit (INDEPENDENT_AMBULATORY_CARE_PROVIDER_SITE_OTHER): Payer: Commercial Managed Care - HMO | Admitting: Family Medicine

## 2015-11-03 DIAGNOSIS — E538 Deficiency of other specified B group vitamins: Secondary | ICD-10-CM | POA: Diagnosis not present

## 2015-11-03 DIAGNOSIS — D519 Vitamin B12 deficiency anemia, unspecified: Secondary | ICD-10-CM | POA: Diagnosis not present

## 2015-11-09 ENCOUNTER — Ambulatory Visit (INDEPENDENT_AMBULATORY_CARE_PROVIDER_SITE_OTHER): Payer: Commercial Managed Care - HMO | Admitting: Ophthalmology

## 2015-11-20 ENCOUNTER — Telehealth: Payer: Self-pay | Admitting: Family Medicine

## 2015-11-20 NOTE — Telephone Encounter (Signed)
[employer] referral approval for Dr Tempie Hoist at [facility] for 6 visits 11/09/15 - 05/07/16 for H35.30 macular Degen.  Auth # [id number]

## 2015-12-07 ENCOUNTER — Ambulatory Visit (INDEPENDENT_AMBULATORY_CARE_PROVIDER_SITE_OTHER): Payer: Commercial Managed Care - HMO | Admitting: Ophthalmology

## 2015-12-07 DIAGNOSIS — I10 Essential (primary) hypertension: Secondary | ICD-10-CM | POA: Diagnosis not present

## 2015-12-07 DIAGNOSIS — H353231 Exudative age-related macular degeneration, bilateral, with active choroidal neovascularization: Secondary | ICD-10-CM

## 2015-12-07 DIAGNOSIS — H35033 Hypertensive retinopathy, bilateral: Secondary | ICD-10-CM | POA: Diagnosis not present

## 2015-12-07 DIAGNOSIS — H4313 Vitreous hemorrhage, bilateral: Secondary | ICD-10-CM | POA: Diagnosis not present

## 2015-12-07 DIAGNOSIS — H2513 Age-related nuclear cataract, bilateral: Secondary | ICD-10-CM

## 2015-12-08 ENCOUNTER — Telehealth: Payer: Self-pay | Admitting: Family Medicine

## 2015-12-08 ENCOUNTER — Ambulatory Visit (INDEPENDENT_AMBULATORY_CARE_PROVIDER_SITE_OTHER): Payer: Commercial Managed Care - HMO | Admitting: Family Medicine

## 2015-12-08 DIAGNOSIS — D519 Vitamin B12 deficiency anemia, unspecified: Secondary | ICD-10-CM

## 2015-12-08 DIAGNOSIS — E538 Deficiency of other specified B group vitamins: Secondary | ICD-10-CM

## 2015-12-08 MED ORDER — RIVAROXABAN 20 MG PO TABS: 20.0000 mg | ORAL_TABLET | Freq: Every day | ORAL | Status: DC

## 2015-12-08 NOTE — Telephone Encounter (Signed)
Yes for afib

## 2015-12-08 NOTE — Telephone Encounter (Signed)
Pt is wondering if he needs to stay on Xeralto and if so he needs it to go to [facility]. ????

## 2015-12-08 NOTE — Telephone Encounter (Signed)
Medication called/sent to requested pharmacy  

## 2016-01-20 ENCOUNTER — Ambulatory Visit (INDEPENDENT_AMBULATORY_CARE_PROVIDER_SITE_OTHER): Payer: Commercial Managed Care - HMO | Admitting: Family Medicine

## 2016-01-20 DIAGNOSIS — D519 Vitamin B12 deficiency anemia, unspecified: Secondary | ICD-10-CM | POA: Diagnosis not present

## 2016-03-03 ENCOUNTER — Ambulatory Visit (INDEPENDENT_AMBULATORY_CARE_PROVIDER_SITE_OTHER): Payer: Commercial Managed Care - HMO | Admitting: *Deleted

## 2016-03-03 DIAGNOSIS — D519 Vitamin B12 deficiency anemia, unspecified: Secondary | ICD-10-CM

## 2016-03-03 NOTE — Progress Notes (Signed)
Patient seen in office for Vitamin B 12 injection.   Tolerated IM administration well.  

## 2016-03-18 ENCOUNTER — Encounter: Payer: Self-pay | Admitting: Cardiology

## 2016-04-03 NOTE — Progress Notes (Signed)
Cardiology Office Note   Date:  04/04/2016   ID:  Austin Mesi., DOB 09-Jun-1932, MRN [id number]  PCP:  Odette Fraction, MD  Cardiologist:   Minus Breeding, MD   Chief Complaint  Patient presents with  . Atrial Fibrillation      History of Present Illness: Austin Moffet. is a 82 y.o. male who presents for evaluation of atrial fib.  He was found to have an irregular rhythm which was correctly thought to be atrial fib.  He does not notice this.   I had him wear a Holter monitor and he had reasonable rate control. He is taking Xarelto and having no symptoms related to this. An echo demonstrated moderate MR.  He returns for follow up.  Since I last saw him he has done well.  The patient denies any new symptoms such as chest discomfort, neck or arm discomfort. There has been no new shortness of breath, PND or orthopnea. There have been no reported palpitations, presyncope or syncope.   Past Medical History:  Diagnosis Date  . Arthritis    "little bit; left knee"  . Basal cell carcinoma   . Colon polyps    last colonoscopy 2015 (tubular adenoma)  . Diverticulosis   . Gastritis   . H/O: GI bleed    Recurrent  . Hemorrhoids   . History of BPH   . Hypertension   . Pernicious anemia   . PVC (premature ventricular contraction)   . Rectal bleeding 02/15/12  . Visual impairment   . Vitamin D deficiency     Past Surgical History:  Procedure Laterality Date  . [place]; ~ 2002   left; right     Current Outpatient Prescriptions  Medication Sig Dispense Refill  . amLODipine (NORVASC) 10 MG tablet Take 1 tablet (10 mg total) by mouth daily. 90 tablet 3  . benazepril (LOTENSIN) 40 MG tablet Take 1 tablet (40 mg total) by mouth daily. 90 tablet 3  . cloNIDine (CATAPRES) 0.1 MG tablet Take 1 tablet (0.1 mg total) by mouth at bedtime. 90 tablet 3  . doxazosin (CARDURA) 4 MG tablet Take 1 tablet (4 mg total) by mouth daily. 90 tablet 3  . ferrous  sulfate 325 (65 FE) MG tablet Take 325 mg by mouth daily with breakfast.    . hydrochlorothiazide (HYDRODIURIL) 25 MG tablet Take 1 tablet (25 mg total) by mouth daily. 90 tablet 3  . metoprolol succinate (TOPROL-XL) 25 MG 24 hr tablet Take 1 tablet (25 mg total) by mouth daily. 90 tablet 3  . Multiple Vitamin (MULTIVITAMIN WITH MINERALS) TABS Take 1 tablet by mouth daily.    . multivitamin-lutein (OCUVITE-LUTEIN) CAPS Take 1 capsule by mouth daily.    . rivaroxaban (XARELTO) 20 MG TABS tablet Take 1 tablet (20 mg total) by mouth daily with supper. 90 tablet 3   Current Facility-Administered Medications  Medication Dose Route Frequency Provider Last Rate Last Dose  . cyanocobalamin ((VITAMIN B-12)) injection 1,000 mcg  1,000 mcg Subcutaneous Q30 days [name], MD   1,000 mcg at 03/03/16 0845    Allergies:   Review of patient's allergies indicates no known allergies.    ROS:  Please see the history of present illness.   Otherwise, review of systems are positive for joint pain.   All other systems are reviewed and negative.    PHYSICAL EXAM: VS:  BP (!) 170/96 (BP Location: Right Arm, Patient Position: Sitting, Cuff  Size: Large)   Pulse 96   Ht 5\' 8"  (1.727 m)   Wt 253 lb (114.8 kg)   BMI 38.47 kg/m  , BMI Body mass index is 38.47 kg/m. GENERAL:  Well appearing NECK:  No jugular venous distention, waveform within normal limits, carotid upstroke brisk and symmetric, no bruits, no thyromegaly LUNGS:  Clear to auscultation bilaterally CHEST:  Unremarkable HEART:  PMI not displaced or sustained,S1 and S2 within normal limits, no S3, no clicks, no rubs, 3/6 apical systolic murmur at the apex and radiating to the axilla.  No diastolic murmurs.  Irregular ABD:  Flat, positive bowel sounds normal in frequency in pitch, no bruits, no rebound, no guarding, no midline pulsatile mass, no hepatomegaly, no splenomegaly EXT:  2 plus pulses throughout, mild edema, no cyanosis no  clubbing   EKG:  EKG is not  ordered today.   Recent Labs: 08/24/2015: ALT 17; BUN 9; Creat 0.86; Hemoglobin 13.5; Platelets 197; Potassium 3.8; Sodium 137; TSH 1.67    Lipid Panel    Component Value Date/Time   CHOL 183 08/24/2015 1015   TRIG 78 08/24/2015 1015   HDL 63 08/24/2015 1015   CHOLHDL 2.9 08/24/2015 1015   VLDL 16 08/24/2015 1015   LDLCALC 104 08/24/2015 1015      Wt Readings from Last 3 Encounters:  04/04/16 253 lb (114.8 kg)  10/19/15 250 lb (113.4 kg)  09/21/15 251 lb (113.9 kg)      Other studies Reviewed: Additional studies/ records that were reviewed today include: Echo Review of the above records demonstrates:  Please see elsewhere in the note.     ASSESSMENT AND PLAN:  ATRIAL FIB:  He is completely asymptomatic with this rhythm.  He tolerates anticoagulation.   This patients CHA2DS2-VASc Score and unadjusted Ischemic Stroke Rate (% per year) is equal to 3.2 % stroke rate/year from a score of 3.  He will continue with meds as listed.  MURMUR:  He has moderate MR which I will follow clinically.   EDEMA:  He is not bothered by this and we can treat conservatively.   OBESITY: The patient understands the need to lose weight with diet and exercise. We have discussed specific strategies for this.  HTN:  His BP was slightly elevated.  However, this is usually not the case.  He will keep a BP diary.  I will adjust his medications based on this result.    Current medicines are reviewed at length with the patient today.  The patient does not have concerns regarding medicines.  The following changes have been made:  None  Labs/ tests ordered today include:   Orders Placed This Encounter  Procedures  . CBC     Disposition:   FU with me in six months.    Signed, Minus Breeding, MD  04/04/2016 12:42 PM    [facility]

## 2016-04-04 ENCOUNTER — Ambulatory Visit (INDEPENDENT_AMBULATORY_CARE_PROVIDER_SITE_OTHER): Payer: Commercial Managed Care - HMO | Admitting: Cardiology

## 2016-04-04 ENCOUNTER — Encounter: Payer: Self-pay | Admitting: Cardiology

## 2016-04-04 ENCOUNTER — Telehealth: Payer: Self-pay | Admitting: Family Medicine

## 2016-04-04 VITALS — BP 170/96 | HR 96 | Ht 68.0 in | Wt 253.0 lb

## 2016-04-04 DIAGNOSIS — I4819 Other persistent atrial fibrillation: Secondary | ICD-10-CM

## 2016-04-04 DIAGNOSIS — I481 Persistent atrial fibrillation: Secondary | ICD-10-CM

## 2016-04-04 DIAGNOSIS — Z79899 Other long term (current) drug therapy: Secondary | ICD-10-CM | POA: Diagnosis not present

## 2016-04-04 NOTE — Telephone Encounter (Signed)
Received fax from [place] back Auth# [id number] Treating Provider Minus Breeding # of Visits 6 Start Date 03/24/2016 End Date 09/20/2016 CPT 99499 DX I44.7 and I48.91

## 2016-04-04 NOTE — Patient Instructions (Addendum)
Medication Instructions:  Continue current medcations  Labwork: CBC  Testing/Procedures: None Ordered  Follow-Up: Your physician wants you to follow-up in: 6 Months. You will receive a reminder letter in the mail two months in advance. If you don't receive a letter, please call our office to schedule the follow-up appointment.   Any Other Special Instructions Will Be Listed Below (If Applicable).   If you need a refill on your cardiac medications before your next appointment, please call your pharmacy.

## 2016-04-07 ENCOUNTER — Ambulatory Visit (INDEPENDENT_AMBULATORY_CARE_PROVIDER_SITE_OTHER): Payer: Commercial Managed Care - HMO | Admitting: *Deleted

## 2016-04-07 DIAGNOSIS — Z23 Encounter for immunization: Secondary | ICD-10-CM | POA: Diagnosis not present

## 2016-04-07 DIAGNOSIS — I4819 Other persistent atrial fibrillation: Secondary | ICD-10-CM

## 2016-04-07 DIAGNOSIS — I10 Essential (primary) hypertension: Secondary | ICD-10-CM

## 2016-04-07 DIAGNOSIS — E538 Deficiency of other specified B group vitamins: Secondary | ICD-10-CM

## 2016-04-07 DIAGNOSIS — Z13220 Encounter for screening for lipoid disorders: Secondary | ICD-10-CM

## 2016-04-07 DIAGNOSIS — Z125 Encounter for screening for malignant neoplasm of prostate: Secondary | ICD-10-CM

## 2016-04-07 MED ORDER — CYANOCOBALAMIN 1000 MCG/ML IJ SOLN
1000.0000 ug | Freq: Once | INTRAMUSCULAR | Status: AC
  Administered 2016-04-07: 1000 ug via INTRAMUSCULAR

## 2016-04-07 NOTE — Progress Notes (Signed)
Patient seen in office for Influenza Vaccination and Vit B 12 administration.   Tolerated IM administration well.   Immunization history updated.

## 2016-04-08 ENCOUNTER — Other Ambulatory Visit: Payer: Commercial Managed Care - HMO

## 2016-04-08 DIAGNOSIS — I10 Essential (primary) hypertension: Secondary | ICD-10-CM

## 2016-04-08 DIAGNOSIS — I4819 Other persistent atrial fibrillation: Secondary | ICD-10-CM

## 2016-04-08 DIAGNOSIS — Z13220 Encounter for screening for lipoid disorders: Secondary | ICD-10-CM

## 2016-04-08 DIAGNOSIS — Z125 Encounter for screening for malignant neoplasm of prostate: Secondary | ICD-10-CM

## 2016-04-08 DIAGNOSIS — I481 Persistent atrial fibrillation: Secondary | ICD-10-CM | POA: Diagnosis not present

## 2016-04-08 LAB — CBC WITH DIFFERENTIAL/PLATELET
BASOS PCT: 0 %
Basophils Absolute: 0 cells/uL (ref 0–200)
Eosinophils Absolute: 99 cells/uL (ref 15–500)
Eosinophils Relative: 1 %
HEMATOCRIT: 39.6 % (ref 38.5–50.0)
Hemoglobin: 13.3 g/dL (ref 13.0–17.0)
LYMPHS PCT: 14 %
Lymphs Abs: 1386 cells/uL (ref 850–3900)
MCH: 28.4 pg (ref 27.0–33.0)
MCHC: 33.6 g/dL (ref 32.0–36.0)
MCV: 84.4 fL (ref 80.0–100.0)
MONOS PCT: 7 %
MPV: 11 fL (ref 7.5–12.5)
Monocytes Absolute: 693 cells/uL (ref 200–950)
NEUTROS PCT: 78 %
Neutro Abs: 7722 cells/uL (ref 1500–7800)
PLATELETS: 157 10*3/uL (ref 140–400)
RBC: 4.69 MIL/uL (ref 4.20–5.80)
RDW: 14.5 % (ref 11.0–15.0)
WBC: 9.9 10*3/uL (ref 3.8–10.8)

## 2016-04-09 LAB — COMPLETE METABOLIC PANEL WITH GFR
ALT: 22 U/L (ref 9–46)
AST: 24 U/L (ref 10–35)
Albumin: 4.1 g/dL (ref 3.6–5.1)
Alkaline Phosphatase: 69 U/L (ref 40–115)
BUN: 9 mg/dL (ref 7–25)
CALCIUM: 9.1 mg/dL (ref 8.6–10.3)
CHLORIDE: 101 mmol/L (ref 98–110)
CO2: 24 mmol/L (ref 20–31)
CREATININE: 0.86 mg/dL (ref 0.70–1.11)
GFR, Est Non African American: 81 mL/min (ref >=60)
Glucose, Bld: 103 mg/dL — ABNORMAL HIGH (ref 70–99)
POTASSIUM: 3.9 mmol/L (ref 3.5–5.3)
Sodium: 137 mmol/L (ref 135–146)
Total Bilirubin: 1.1 mg/dL (ref 0.2–1.2)
Total Protein: 6.4 g/dL (ref 6.1–8.1)

## 2016-04-09 LAB — PSA: PSA: 1.6 ng/mL (ref <=4.0)

## 2016-04-09 LAB — LIPID PANEL
CHOL/HDL RATIO: 2.8 ratio (ref <=5.0)
CHOLESTEROL: 176 mg/dL (ref 125–200)
HDL: 64 mg/dL (ref >=40)
LDL CALC: 101 mg/dL (ref <130)
TRIGLYCERIDES: 57 mg/dL (ref <150)
VLDL: 11 mg/dL (ref <30)

## 2016-04-25 ENCOUNTER — Encounter: Payer: Self-pay | Admitting: Family Medicine

## 2016-04-25 ENCOUNTER — Ambulatory Visit (INDEPENDENT_AMBULATORY_CARE_PROVIDER_SITE_OTHER): Payer: Commercial Managed Care - HMO | Admitting: Family Medicine

## 2016-04-25 VITALS — BP 136/74 | HR 86 | Temp 98.2°F | Resp 22 | Ht 68.0 in | Wt 251.0 lb

## 2016-04-25 DIAGNOSIS — I481 Persistent atrial fibrillation: Secondary | ICD-10-CM

## 2016-04-25 DIAGNOSIS — I4891 Unspecified atrial fibrillation: Secondary | ICD-10-CM

## 2016-04-25 DIAGNOSIS — Z13220 Encounter for screening for lipoid disorders: Secondary | ICD-10-CM | POA: Diagnosis not present

## 2016-04-25 DIAGNOSIS — R000 Tachycardia, unspecified: Secondary | ICD-10-CM | POA: Diagnosis not present

## 2016-04-25 DIAGNOSIS — I10 Essential (primary) hypertension: Secondary | ICD-10-CM

## 2016-04-25 DIAGNOSIS — Z0000 Encounter for general adult medical examination without abnormal findings: Secondary | ICD-10-CM | POA: Diagnosis not present

## 2016-04-25 DIAGNOSIS — I4819 Other persistent atrial fibrillation: Secondary | ICD-10-CM

## 2016-04-25 NOTE — Progress Notes (Signed)
Subjective:    Patient ID: Austin Gray., male    DOB: 1932/06/07, 82 y.o.   MRN: [id number]  HPI  Patient is here today for complete physical exam. He has no concerns.  Patient's last colonoscopy was performed 2015.  They recommended he have a repeat colonoscopy in 3 years. He denies any signs or symptoms of lower GI bleeding this year. He does have a history of such.  Patient's Pneumovax and Prevnar are up to date. His tetanus shot is up-to-date. He is due for his annual flu shot.    Immunization History  Administered Date(s) Administered  . Influenza Split 05/09/2012  . Influenza Whole 04/17/2009  . Influenza,inj,Quad PF,36+ Mos 03/22/2013, 03/27/2014, 04/06/2015, 04/07/2016  . Pneumococcal Conjugate-13 12/09/2014  . Pneumococcal Polysaccharide-23 04/17/2000  . Td 06/17/2002  . Tdap 06/29/2015   Due to his age, I would not recommend prostate cancer screening. He declines shingles vaccine due to cost. He has a history of A. fib. However his heart rate is extremely rapid today during his exam. He is asymptomatic. He denies any chest pain shortness of breath or dyspnea on exertion Most recent lab work is listed below: Appointment on 04/08/2016  Component Date Value Ref Range Status  . WBC 04/08/2016 9.9  3.8 - 10.8 K/uL Final  . RBC 04/08/2016 4.69  4.20 - 5.80 MIL/uL Final  . Hemoglobin 04/08/2016 13.3  13.0 - 17.0 g/dL Final  . HCT 04/08/2016 39.6  38.5 - 50.0 % Final  . MCV 04/08/2016 84.4  80.0 - 100.0 fL Final  . MCH 04/08/2016 28.4  27.0 - 33.0 pg Final  . MCHC 04/08/2016 33.6  32.0 - 36.0 g/dL Final  . RDW 04/08/2016 14.5  11.0 - 15.0 % Final  . Platelets 04/08/2016 157  140 - 400 K/uL Final  . MPV 04/08/2016 11.0  7.5 - 12.5 fL Final  . Neutro Abs 04/08/2016 7722  1,500 - 7,800 cells/uL Final  . Lymphs Abs 04/08/2016 1386  850 - 3,900 cells/uL Final  . Monocytes Absolute 04/08/2016 693  200 - 950 cells/uL Final  . Eosinophils Absolute 04/08/2016 99  15 - 500  cells/uL Final  . Basophils Absolute 04/08/2016 0  0 - 200 cells/uL Final  . Neutrophils Relative % 04/08/2016 78  % Final  . Lymphocytes Relative 04/08/2016 14  % Final  . Monocytes Relative 04/08/2016 7  % Final  . Eosinophils Relative 04/08/2016 1  % Final  . Basophils Relative 04/08/2016 0  % Final  . Smear Review 04/08/2016 Criteria for review not met   Final  . Sodium 04/09/2016 137  135 - 146 mmol/L Final  . Potassium 04/09/2016 3.9  3.5 - 5.3 mmol/L Final  . Chloride 04/09/2016 101  98 - 110 mmol/L Final  . CO2 04/09/2016 24  20 - 31 mmol/L Final  . Glucose, Bld 04/09/2016 103* 70 - 99 mg/dL Final  . BUN 04/09/2016 9  7 - 25 mg/dL Final  . Creat 04/09/2016 0.86  0.70 - 1.11 mg/dL Final   Comment:   For patients > or = 80 years of age: The upper reference limit for Creatinine is approximately 13% higher for people identified as African-American.     . Total Bilirubin 04/09/2016 1.1  0.2 - 1.2 mg/dL Final  . Alkaline Phosphatase 04/09/2016 69  40 - 115 U/L Final  . AST 04/09/2016 24  10 - 35 U/L Final  . ALT 04/09/2016 22  9 - 46 U/L Final  .  Total Protein 04/09/2016 6.4  6.1 - 8.1 g/dL Final  . Albumin 04/09/2016 4.1  3.6 - 5.1 g/dL Final  . Calcium 04/09/2016 9.1  8.6 - 10.3 mg/dL Final  . GFR, Est African American 04/09/2016 >89  >=60 mL/min Final  . GFR, Est Non African American 04/09/2016 81  >=60 mL/min Final  . Cholesterol 04/09/2016 176  125 - 200 mg/dL Final  . Triglycerides 04/09/2016 57  <150 mg/dL Final  . HDL 04/09/2016 64  >=40 mg/dL Final  . Total CHOL/HDL Ratio 04/09/2016 2.8  <=5.0 Ratio Final  . VLDL 04/09/2016 11  <30 mg/dL Final  . LDL Cholesterol 04/09/2016 101  <130 mg/dL Final   Comment:   Total Cholesterol/HDL Ratio:CHD Risk                        Coronary Heart Disease Risk Table                                        Men       Women          1/2 Average Risk              3.4        3.3              Average Risk              5.0        4.4            2X Average Risk              9.6        7.1           3X Average Risk             23.4       11.0 Use the calculated Patient Ratio above and the CHD Risk table  to determine the patient's CHD Risk.   Marland Kitchen PSA 04/09/2016 1.6  <=4.0 ng/mL Final   Comment:   The total PSA value from this assay system is standardized against the WHO standard. The test result will be approximately 20% lower when compared to the equimolar-standardized total PSA (Beck[employer] Coulter). Comparison of serial PSA results should be interpreted with this fact in mind.   This test was performed using the Siemens chemiluminescent method. Values obtained from different assay methods cannot be used interchangeably. PSA levels, regardless of value, should not be interpreted as absolute evidence of the presence or absence of disease.   Effective February 22, 2016, Total PSA is being tested on the Siemens Centaur XP using chemiluminescence methodology. Re-baseline testing will be available until May 23, 2016 at no charge. If you have a patient that may require re-baselining, please order [id number] in addition to [id number].     Past Medical History:  Diagnosis Date  . Arthritis    "little bit; left knee"  . Basal cell carcinoma   . Colon polyps    last colonoscopy 2015 (tubular adenoma)  . Diverticulosis   . Gastritis   . H/O: GI bleed    Recurrent  . Hemorrhoids   . History of BPH   . Hypertension   . Pernicious anemia   . PVC (premature ventricular contraction)   . Rectal bleeding 02/15/12  . Visual impairment   . Vitamin D deficiency  Past Surgical History:  Procedure Laterality Date  . [place]; ~ 2002   left; right   Current Outpatient Prescriptions on File Prior to Visit  Medication Sig Dispense Refill  . amLODipine (NORVASC) 10 MG tablet Take 1 tablet (10 mg total) by mouth daily. 90 tablet 3  . benazepril (LOTENSIN) 40 MG tablet Take 1 tablet (40 mg total) by mouth daily. 90  tablet 3  . cloNIDine (CATAPRES) 0.1 MG tablet Take 1 tablet (0.1 mg total) by mouth at bedtime. 90 tablet 3  . doxazosin (CARDURA) 4 MG tablet Take 1 tablet (4 mg total) by mouth daily. 90 tablet 3  . ferrous sulfate 325 (65 FE) MG tablet Take 325 mg by mouth daily with breakfast.    . hydrochlorothiazide (HYDRODIURIL) 25 MG tablet Take 1 tablet (25 mg total) by mouth daily. 90 tablet 3  . metoprolol succinate (TOPROL-XL) 25 MG 24 hr tablet Take 1 tablet (25 mg total) by mouth daily. 90 tablet 3  . Multiple Vitamin (MULTIVITAMIN WITH MINERALS) TABS Take 1 tablet by mouth daily.    . multivitamin-lutein (OCUVITE-LUTEIN) CAPS Take 1 capsule by mouth daily.    . rivaroxaban (XARELTO) 20 MG TABS tablet Take 1 tablet (20 mg total) by mouth daily with supper. 90 tablet 3   Current Facility-Administered Medications on File Prior to Visit  Medication Dose Route Frequency Provider Last Rate Last Dose  . cyanocobalamin ((VITAMIN B-12)) injection 1,000 mcg  1,000 mcg Subcutaneous Q30 days [name], MD   1,000 mcg at 03/03/16 0845   No Known Allergies Social History   Social History  . Marital status: Widowed    Spouse name: N/A  . Number of children: 1  . Years of education: N/A   Occupational History  . Retired    Social History Main Topics  . Smoking status: Never Smoker  . Smokeless tobacco: Current User    Types: Snuff     Comment: 02/15/12 "still use snuff @ times; not daily"  . Alcohol use No  . Drug use: No  . Sexual activity: No     Comment: widower, lives with granddaughter and great grandchildren.   Other Topics Concern  . Not on file   Social History Narrative   Lives with grand daughter.         Epworth Sleepiness Scale = 7 (as of 09/21/2015)   Family History  Problem Relation Age of Onset  . Stroke Father 68  . Cancer Mother      Review of Systems  All other systems reviewed and are negative.      Objective:   Physical Exam  Constitutional: He is  oriented to person, place, and time. He appears well-developed and well-nourished. No distress.  HENT:  Head: Normocephalic and atraumatic.  Right Ear: External ear normal.  Left Ear: External ear normal.  Nose: Nose normal.  Mouth/Throat: Oropharynx is clear and moist. No oropharyngeal exudate.  Eyes: Conjunctivae and EOM are normal. Pupils are equal, round, and reactive to light. Right eye exhibits no discharge. Left eye exhibits no discharge. No scleral icterus.  Neck: Normal range of motion. Neck supple. No JVD present. No tracheal deviation present. No thyromegaly present.  Cardiovascular: Normal rate, regular rhythm, normal heart sounds and intact distal pulses.  Exam reveals no gallop and no friction rub.   No murmur heard. Pulmonary/Chest: Effort normal and breath sounds normal. No stridor. No respiratory distress. He has no wheezes. He has no rales.  He exhibits no tenderness.  Abdominal: Soft. Bowel sounds are normal. He exhibits no distension and no mass. There is no tenderness. There is no rebound and no guarding.  Musculoskeletal: Normal range of motion. He exhibits no edema or tenderness.  Lymphadenopathy:    He has no cervical adenopathy.  Neurological: He is alert and oriented to person, place, and time. He has normal reflexes. No cranial nerve deficit. He exhibits normal muscle tone. Coordination normal.  Skin: Skin is warm. No rash noted. He is not diaphoretic. No erythema. No pallor.  Psychiatric: He has a normal mood and affect. His behavior is normal. Judgment and thought content normal.  Vitals reviewed.  Patient has bilateral cataracts       Assessment & Plan:  Rapid heart rate - Plan: EKG 12-Lead Gen Med Exam  Immunizations are up-to-date. Cancer screening is up-to-date. Lab work is excellent. However his heart rate is rapid. His atrial fibrillation seems poorly controlled even though he is appropriately anticoagulated. I will perform an EKG.  I will increase his  Toprol-XL to 50 mg a day and recheck the patient on Friday to reassess his heart rate. EKG confirms atrial fibrillation with a heart rate greater than 100 bpm. Patient has interventricular conduction delay with diffuse ST depression throughout. This is consistent and unchanged from his EKG in March of this year.

## 2016-04-29 ENCOUNTER — Ambulatory Visit (INDEPENDENT_AMBULATORY_CARE_PROVIDER_SITE_OTHER): Payer: Commercial Managed Care - HMO | Admitting: Family Medicine

## 2016-04-29 ENCOUNTER — Encounter: Payer: Self-pay | Admitting: Family Medicine

## 2016-04-29 VITALS — BP 136/80 | HR 98 | Temp 98.4°F | Resp 18 | Wt 249.0 lb

## 2016-04-29 DIAGNOSIS — I481 Persistent atrial fibrillation: Secondary | ICD-10-CM

## 2016-04-29 DIAGNOSIS — I4819 Other persistent atrial fibrillation: Secondary | ICD-10-CM

## 2016-04-29 NOTE — Progress Notes (Signed)
Subjective:    Patient ID: Austin Mesi., male    DOB: 1931/11/13, 82 y.o.   MRN: [id number]  HPI 04/25/16 Patient is here today for complete physical exam. He has no concerns.  Patient's last colonoscopy was performed 2015.  They recommended he have a repeat colonoscopy in 3 years. He denies any signs or symptoms of lower GI bleeding this year. He does have a history of such.  Patient's Pneumovax and Prevnar are up to date. His tetanus shot is up-to-date. He is due for his annual flu shot.    Immunization History  Administered Date(s) Administered  . Influenza Split 05/09/2012  . Influenza Whole 04/17/2009  . Influenza,inj,Quad PF,36+ Mos 03/22/2013, 03/27/2014, 04/06/2015, 04/07/2016  . Pneumococcal Conjugate-13 12/09/2014  . Pneumococcal Polysaccharide-23 04/17/2000  . Td 06/17/2002  . Tdap 06/29/2015   Due to his age, I would not recommend prostate cancer screening. He declines shingles vaccine due to cost. He has a history of A. fib. However his heart rate is extremely rapid today during his exam. He is asymptomatic. He denies any chest pain shortness of breath or dyspnea on exertion At that time, my plan was:  Immunizations are up-to-date. Cancer screening is up-to-date. Lab work is excellent. However his heart rate is rapid. His atrial fibrillation seems poorly controlled even though he is appropriately anticoagulated. I will perform an EKG.  I will increase his Toprol-XL to 50 mg a day and recheck the patient on Friday to reassess his heart rate. EKG confirms atrial fibrillation with a heart rate greater than 100 bpm. Patient has interventricular conduction delay with diffuse ST depression throughout. This is consistent and unchanged from his EKG in March of this year. 04/29/16 His heart rate is much better today. My nurse checked him in at 98 bpm. However this was done with an automated machine. He is in atrial fibrillation. One actually count his heart rate for 60 seconds,  his heart rate is actually 72 bpm. It is much improved compared to what I saw on his exam earlier this week. He denies any dizziness or lightheadedness or syncope or presyncope Past Medical History:  Diagnosis Date  . Arthritis    "little bit; left knee"  . Basal cell carcinoma   . Colon polyps    last colonoscopy 2015 (tubular adenoma)  . Diverticulosis   . Gastritis   . H/O: GI bleed    Recurrent  . Hemorrhoids   . History of BPH   . Hypertension   . Pernicious anemia   . PVC (premature ventricular contraction)   . Rectal bleeding 02/15/12  . Visual impairment   . Vitamin D deficiency    Past Surgical History:  Procedure Laterality Date  . [place]; ~ 2002   left; right   Current Outpatient Prescriptions on File Prior to Visit  Medication Sig Dispense Refill  . amLODipine (NORVASC) 10 MG tablet Take 1 tablet (10 mg total) by mouth daily. 90 tablet 3  . benazepril (LOTENSIN) 40 MG tablet Take 1 tablet (40 mg total) by mouth daily. 90 tablet 3  . cloNIDine (CATAPRES) 0.1 MG tablet Take 1 tablet (0.1 mg total) by mouth at bedtime. 90 tablet 3  . doxazosin (CARDURA) 4 MG tablet Take 1 tablet (4 mg total) by mouth daily. 90 tablet 3  . ferrous sulfate 325 (65 FE) MG tablet Take 325 mg by mouth daily with breakfast.    . hydrochlorothiazide (HYDRODIURIL) 25 MG tablet Take 1  tablet (25 mg total) by mouth daily. 90 tablet 3  . metoprolol succinate (TOPROL-XL) 25 MG 24 hr tablet Take 1 tablet (25 mg total) by mouth daily. 90 tablet 3  . Multiple Vitamin (MULTIVITAMIN WITH MINERALS) TABS Take 1 tablet by mouth daily.    . multivitamin-lutein (OCUVITE-LUTEIN) CAPS Take 1 capsule by mouth daily.    . rivaroxaban (XARELTO) 20 MG TABS tablet Take 1 tablet (20 mg total) by mouth daily with supper. 90 tablet 3   Current Facility-Administered Medications on File Prior to Visit  Medication Dose Route Frequency Provider Last Rate Last Dose  . cyanocobalamin ((VITAMIN B-12))  injection 1,000 mcg  1,000 mcg Subcutaneous Q30 days [name], MD   1,000 mcg at 03/03/16 0845   No Known Allergies Social History   Social History  . Marital status: Widowed    Spouse name: N/A  . Number of children: 1  . Years of education: N/A   Occupational History  . Retired    Social History Main Topics  . Smoking status: Never Smoker  . Smokeless tobacco: Current User    Types: Snuff     Comment: 02/15/12 "still use snuff @ times; not daily"  . Alcohol use No  . Drug use: No  . Sexual activity: No     Comment: widower, lives with granddaughter and great grandchildren.   Other Topics Concern  . Not on file   Social History Narrative   Lives with grand daughter.         Epworth Sleepiness Scale = 7 (as of 09/21/2015)   Family History  Problem Relation Age of Onset  . Stroke Father 61  . Cancer Mother      Review of Systems  All other systems reviewed and are negative.      Objective:   Physical Exam  Constitutional: He is oriented to person, place, and time. He appears well-developed and well-nourished. No distress.  HENT:  Head: Normocephalic and atraumatic.  Right Ear: External ear normal.  Left Ear: External ear normal.  Nose: Nose normal.  Mouth/Throat: Oropharynx is clear and moist. No oropharyngeal exudate.  Eyes: Conjunctivae and EOM are normal. Pupils are equal, round, and reactive to light. Right eye exhibits no discharge. Left eye exhibits no discharge. No scleral icterus.  Neck: Normal range of motion. Neck supple. No JVD present. No tracheal deviation present. No thyromegaly present.  Cardiovascular: Normal rate, normal heart sounds and intact distal pulses.  An irregularly irregular rhythm present. Exam reveals no gallop and no friction rub.   No murmur heard. Pulmonary/Chest: Effort normal and breath sounds normal. No stridor. No respiratory distress. He has no wheezes. He has no rales. He exhibits no tenderness.  Abdominal: Soft.  Bowel sounds are normal. He exhibits no distension and no mass. There is no tenderness. There is no rebound and no guarding.  Musculoskeletal: Normal range of motion. He exhibits no edema or tenderness.  Lymphadenopathy:    He has no cervical adenopathy.  Neurological: He is alert and oriented to person, place, and time. He has normal reflexes. No cranial nerve deficit. He exhibits normal muscle tone. Coordination normal.  Skin: Skin is warm. No rash noted. He is not diaphoretic. No erythema. No pallor.  Psychiatric: He has a normal mood and affect. His behavior is normal. Judgment and thought content normal.  Vitals reviewed.         Assessment & Plan:  Atrial fibrillation, persistent (HCC)  Continue metoprolol XL 50 mg  by mouth daily. Otherwise I'll make no changes in his medication at this time.

## 2016-05-13 ENCOUNTER — Ambulatory Visit (INDEPENDENT_AMBULATORY_CARE_PROVIDER_SITE_OTHER): Payer: Commercial Managed Care - HMO | Admitting: Family Medicine

## 2016-05-13 DIAGNOSIS — D519 Vitamin B12 deficiency anemia, unspecified: Secondary | ICD-10-CM

## 2016-05-13 DIAGNOSIS — E538 Deficiency of other specified B group vitamins: Secondary | ICD-10-CM

## 2016-05-19 ENCOUNTER — Ambulatory Visit (INDEPENDENT_AMBULATORY_CARE_PROVIDER_SITE_OTHER): Payer: Commercial Managed Care - HMO | Admitting: Family Medicine

## 2016-05-19 ENCOUNTER — Encounter: Payer: Self-pay | Admitting: Family Medicine

## 2016-05-19 VITALS — BP 142/84 | HR 88 | Temp 97.9°F | Resp 20 | Ht 68.0 in | Wt 262.0 lb

## 2016-05-19 DIAGNOSIS — L608 Other nail disorders: Secondary | ICD-10-CM | POA: Diagnosis not present

## 2016-05-19 MED ORDER — AMLODIPINE BESYLATE 10 MG PO TABS: 10.0000 mg | ORAL_TABLET | Freq: Every day | ORAL | 3 refills | Status: DC

## 2016-05-19 MED ORDER — METOPROLOL SUCCINATE ER 50 MG PO TB24: 50.0000 mg | ORAL_TABLET | Freq: Every day | ORAL | 3 refills | Status: DC

## 2016-05-19 MED ORDER — HYDROCHLOROTHIAZIDE 25 MG PO TABS: 25.0000 mg | ORAL_TABLET | Freq: Every day | ORAL | 3 refills | Status: DC

## 2016-05-19 MED ORDER — BENAZEPRIL HCL 40 MG PO TABS: 40.0000 mg | ORAL_TABLET | Freq: Every day | ORAL | 3 refills | Status: DC

## 2016-05-19 NOTE — Progress Notes (Signed)
Subjective:    Patient ID: Austin Gray., male    DOB: 10/15/1931, 82 y.o.   MRN: [id number]  HPI Patient has a history of a thick dysmorphic toenail on his right great toe. It is thick white yellow. It is been accidentally ripped off on 2 separate occasions. It is painful at times. He would like to have the toenail removed with the nail matrix destroyed hopefully so that the nail will never come back. He is here today for treatment. Past Medical History:  Diagnosis Date  . Arthritis    "little bit; left knee"  . Basal cell carcinoma   . Colon polyps    last colonoscopy 2015 (tubular adenoma)  . Diverticulosis   . Gastritis   . H/O: GI bleed    Recurrent  . Hemorrhoids   . History of BPH   . Hypertension   . Pernicious anemia   . PVC (premature ventricular contraction)   . Rectal bleeding 02/15/12  . Visual impairment   . Vitamin D deficiency    Past Surgical History:  Procedure Laterality Date  . [place]; ~ 2002   left; right   Current Outpatient Prescriptions on File Prior to Visit  Medication Sig Dispense Refill  . cloNIDine (CATAPRES) 0.1 MG tablet Take 1 tablet (0.1 mg total) by mouth at bedtime. 90 tablet 3  . doxazosin (CARDURA) 4 MG tablet Take 1 tablet (4 mg total) by mouth daily. 90 tablet 3  . ferrous sulfate 325 (65 FE) MG tablet Take 325 mg by mouth daily with breakfast.    . Multiple Vitamin (MULTIVITAMIN WITH MINERALS) TABS Take 1 tablet by mouth daily.    . multivitamin-lutein (OCUVITE-LUTEIN) CAPS Take 1 capsule by mouth daily.    . rivaroxaban (XARELTO) 20 MG TABS tablet Take 1 tablet (20 mg total) by mouth daily with supper. 90 tablet 3   Current Facility-Administered Medications on File Prior to Visit  Medication Dose Route Frequency Provider Last Rate Last Dose  . cyanocobalamin ((VITAMIN B-12)) injection 1,000 mcg  1,000 mcg Subcutaneous Q30 days [name], MD   1,000 mcg at 05/13/16 [id number]   No Known  Allergies Social History   Social History  . Marital status: Widowed    Spouse name: N/A  . Number of children: 1  . Years of education: N/A   Occupational History  . Retired    Social History Main Topics  . Smoking status: Never Smoker  . Smokeless tobacco: Current User    Types: Snuff     Comment: 02/15/12 "still use snuff @ times; not daily"  . Alcohol use No  . Drug use: No  . Sexual activity: No     Comment: widower, lives with granddaughter and great grandchildren.   Other Topics Concern  . Not on file   Social History Narrative   Lives with grand daughter.         Epworth Sleepiness Scale = 7 (as of 09/21/2015)      Review of Systems  All other systems reviewed and are negative.      Objective:   Physical Exam  Cardiovascular: Normal rate and regular rhythm.   Pulmonary/Chest: Effort normal and breath sounds normal.  Musculoskeletal: He exhibits edema.  Vitals reviewed.  Dysmorphic thick white great toenail       Assessment & Plan:  Acquired dysmorphic toenail  Anesthesia was achieved using a digital block with 0.1% lidocaine. A turn care was applied to  the base of the right great toe. A metal elevator was used to separate the thick white dysmorphic toenail from the underlying nail bed. The entire toenail was then removed with gentle traction using a pair of hemostats. Next the exposed skin was covered with Neosporin to prevent collateral damage. Then 3 drops of phenol was applied to the underlying nail matrix and allowed to sit for 30-40 secs.  of phenol was then quenched with vigorous irrigation using isopropyl alcohol. Neosporin was then applied to the nail bed. The toe was wrapped with petroleum gauze and then dressed with Coban.  Tourniquet was removed. Total tourniquet time was less than 4 minutes. There was no blood loss. The patient tolerated the procedure well without complication

## 2016-06-03 ENCOUNTER — Other Ambulatory Visit: Payer: Self-pay | Admitting: Family Medicine

## 2016-06-20 ENCOUNTER — Ambulatory Visit (INDEPENDENT_AMBULATORY_CARE_PROVIDER_SITE_OTHER): Payer: Commercial Managed Care - HMO | Admitting: Family Medicine

## 2016-06-20 DIAGNOSIS — E538 Deficiency of other specified B group vitamins: Secondary | ICD-10-CM | POA: Diagnosis not present

## 2016-06-28 ENCOUNTER — Ambulatory Visit (INDEPENDENT_AMBULATORY_CARE_PROVIDER_SITE_OTHER): Payer: Commercial Managed Care - HMO | Admitting: Family Medicine

## 2016-06-28 VITALS — BP 146/84 | HR 98 | Temp 98.6°F | Resp 24 | Wt 274.0 lb

## 2016-06-28 DIAGNOSIS — R0600 Dyspnea, unspecified: Secondary | ICD-10-CM | POA: Diagnosis not present

## 2016-06-28 DIAGNOSIS — I4891 Unspecified atrial fibrillation: Secondary | ICD-10-CM

## 2016-06-28 DIAGNOSIS — R635 Abnormal weight gain: Secondary | ICD-10-CM | POA: Diagnosis not present

## 2016-06-28 MED ORDER — FUROSEMIDE 40 MG PO TABS: 40.0000 mg | ORAL_TABLET | Freq: Two times a day (BID) | ORAL | 3 refills | Status: DC

## 2016-06-28 MED ORDER — ALBUTEROL SULFATE HFA 108 (90 BASE) MCG/ACT IN AERS: 2.0000 | INHALATION_SPRAY | Freq: Four times a day (QID) | RESPIRATORY_TRACT | 0 refills | Status: DC | PRN

## 2016-06-28 MED ORDER — PREDNISONE 20 MG PO TABS: ORAL_TABLET | ORAL | 0 refills | Status: DC

## 2016-06-28 NOTE — Progress Notes (Signed)
Subjective:    Patient ID: Austin Mesi., male    DOB: Jun 01, 1932, 82 y.o.   MRN: [id number]  HPI States that for the last week, he has had a worsening cough productive of grey mucus. He also reports increasing shortness of breath, worsening chest congestion.  He states at times, he feels like he is "stopped up". He denies any pleurisy. He denies any hemoptysis. He denies any chest pain. He denies any fevers or chills. I have never heard the patient we like he is wheezing today. He has no history of tobacco abuse, emphysema, or asthma. Patient was given a 2.5 mg albuterol nebulizer treatment at 3:15. Oxygen saturation was 95% on room air. The patient is not in respiratory distress. He does have increased work of breathing.  Of note, he has gained substantial weight in the last 2 office visits. He has +2 pitting edema in both legs. He appears fluid overloaded. He also has diffuse rales in both lungs. Past Medical History:  Diagnosis Date  . Arthritis    "little bit; left knee"  . Basal cell carcinoma   . Colon polyps    last colonoscopy 2015 (tubular adenoma)  . Diverticulosis   . Gastritis   . H/O: GI bleed    Recurrent  . Hemorrhoids   . History of BPH   . Hypertension   . Pernicious anemia   . PVC (premature ventricular contraction)   . Rectal bleeding 02/15/12  . Visual impairment   . Vitamin D deficiency    Past Surgical History:  Procedure Laterality Date  . [place]; ~ 2002   left; right   Current Outpatient Prescriptions on File Prior to Visit  Medication Sig Dispense Refill  . amLODipine (NORVASC) 10 MG tablet Take 1 tablet (10 mg total) by mouth daily. 90 tablet 3  . benazepril (LOTENSIN) 40 MG tablet Take 1 tablet (40 mg total) by mouth daily. 90 tablet 3  . cloNIDine (CATAPRES) 0.1 MG tablet TAKE 1 TABLET AT BEDTIME 90 tablet 3  . doxazosin (CARDURA) 4 MG tablet TAKE 1 TABLET EVERY DAY 90 tablet 3  . ferrous sulfate 325 (65 FE) MG tablet  Take 325 mg by mouth daily with breakfast.    . hydrochlorothiazide (HYDRODIURIL) 25 MG tablet Take 1 tablet (25 mg total) by mouth daily. 90 tablet 3  . metoprolol succinate (TOPROL-XL) 50 MG 24 hr tablet Take 1 tablet (50 mg total) by mouth daily. Take with or immediately following a meal. 90 tablet 3  . Multiple Vitamin (MULTIVITAMIN WITH MINERALS) TABS Take 1 tablet by mouth daily.    . multivitamin-lutein (OCUVITE-LUTEIN) CAPS Take 1 capsule by mouth daily.    . rivaroxaban (XARELTO) 20 MG TABS tablet Take 1 tablet (20 mg total) by mouth daily with supper. 90 tablet 3   Current Facility-Administered Medications on File Prior to Visit  Medication Dose Route Frequency Provider Last Rate Last Dose  . cyanocobalamin ((VITAMIN B-12)) injection 1,000 mcg  1,000 mcg Subcutaneous Q30 days [name], MD   1,000 mcg at 05/13/16 [id number]   No Known Allergies Social History   Social History  . Marital status: Widowed    Spouse name: N/A  . Number of children: 1  . Years of education: N/A   Occupational History  . Retired    Social History Main Topics  . Smoking status: Never Smoker  . Smokeless tobacco: Current User    Types: Snuff  Comment: 02/15/12 "still use snuff @ times; not daily"  . Alcohol use No  . Drug use: No  . Sexual activity: No     Comment: widower, lives with granddaughter and great grandchildren.   Other Topics Concern  . Not on file   Social History Narrative   Lives with grand daughter.         Epworth Sleepiness Scale = 7 (as of 09/21/2015)    Wt Readings from Last 3 Encounters:  06/28/16 274 lb (124.3 kg)  05/19/16 262 lb (118.8 kg)  04/29/16 249 lb (112.9 kg)     Review of Systems  All other systems reviewed and are negative.      Objective:   Physical Exam  Constitutional: He appears well-developed and well-nourished. No distress.  Neck: No JVD present.  Cardiovascular: Normal rate.  An irregularly irregular rhythm present.    Pulmonary/Chest: Effort normal. No respiratory distress. He has decreased breath sounds. He has wheezes. He has rales.  Abdominal: Soft. Bowel sounds are normal.  Musculoskeletal: He exhibits edema.  Skin: He is not diaphoretic.  Vitals reviewed.  Patient has gained substantial weight since his last office visit here. According to my scale she is going 12 pounds in last month. He is up more than 25 pounds in the last 2 months. He does have a history of atrial fibrillation. He does have +1 pitting edema in both legs. Although on examination he is also having bronchospasms and wheezing, this also raises the concern about fluid overload/pulmonary edema.        Assessment & Plan:  Dyspnea, unspecified type - Plan: CBC with Differential/Platelet, Brain natriuretic peptide, COMPLETE METABOLIC PANEL WITH GFR  Weight gain  Atrial fibrillation, unspecified type ([place])  Clinically, the patient appears to have both a bronchospasm with expiratory wheezing as well as fluid overload, pulmonary congestion, and pitting edema in his extremities with more than 25 pounds of weight gain since October. Patient was given a 2.5 mg albuterol nebulizer treatment immediately in the room. I will reassess the patient after 15 minutes to see if this is helping his breathing at all. I believe the patient is fluid overloaded and would benefit from diuresis. I will start the patient on Lasix 40 mg by mouth twice a day and recheck the patient tomorrow. I'll also send him for a chest x-ray. I will check a CBC, CMP, BNP  Subjectively, the patient felt like he benefited from albuterol.  Therefore I will start the patient on a prednisone taper pack given the wheezing that I'm hearing on his exam and also start the patient on albuterol 2 puffs inhaled every 4-6 hours as needed for wheezing. I will see the patient back in 48 hours or immediately if worse. He is to go get a chest x-ray as soon as possible. I do not believe this is  pneumonia but rather pulmonary edema however I want to exclude pneumonia. If there is any evidence of an infiltrate I will also add Levaquin sig medical attention immediately if symptoms worsen

## 2016-06-29 ENCOUNTER — Ambulatory Visit (HOSPITAL_COMMUNITY)
Admission: RE | Admit: 2016-06-29 | Discharge: 2016-06-29 | Disposition: A | Discharge status: Home / Self Care | Payer: Commercial Managed Care - HMO | Source: Ambulatory Visit | Attending: Family Medicine | Admitting: Family Medicine

## 2016-06-29 DIAGNOSIS — R918 Other nonspecific abnormal finding of lung field: Secondary | ICD-10-CM | POA: Insufficient documentation

## 2016-06-29 DIAGNOSIS — R635 Abnormal weight gain: Secondary | ICD-10-CM | POA: Insufficient documentation

## 2016-06-29 DIAGNOSIS — R0600 Dyspnea, unspecified: Secondary | ICD-10-CM | POA: Diagnosis not present

## 2016-06-29 LAB — CBC WITH DIFFERENTIAL/PLATELET
BASOS ABS: 0 {cells}/uL (ref 0–200)
Basophils Relative: 0 %
EOS PCT: 1 %
Eosinophils Absolute: 74 cells/uL (ref 15–500)
HCT: 34.9 % — ABNORMAL LOW (ref 38.5–50.0)
Hemoglobin: 11.4 g/dL — ABNORMAL LOW (ref 13.0–17.0)
Lymphocytes Relative: 11 %
Lymphs Abs: 814 cells/uL — ABNORMAL LOW (ref 850–3900)
MCH: 26.9 pg — AB (ref 27.0–33.0)
MCHC: 32.7 g/dL (ref 32.0–36.0)
MCV: 82.3 fL (ref 80.0–100.0)
MONOS PCT: 11 %
MPV: 11.5 fL (ref 7.5–12.5)
Monocytes Absolute: 814 cells/uL (ref 200–950)
NEUTROS ABS: 5698 {cells}/uL (ref 1500–7800)
NEUTROS PCT: 77 %
PLATELETS: 136 10*3/uL — AB (ref 140–400)
RBC: 4.24 MIL/uL (ref 4.20–5.80)
RDW: 14.8 % (ref 11.0–15.0)
WBC: 7.4 10*3/uL (ref 3.8–10.8)

## 2016-06-29 LAB — COMPLETE METABOLIC PANEL WITH GFR
ALT: 30 U/L (ref 9–46)
AST: 41 U/L — ABNORMAL HIGH (ref 10–35)
Albumin: 3.5 g/dL — ABNORMAL LOW (ref 3.6–5.1)
Alkaline Phosphatase: 102 U/L (ref 40–115)
BUN: 18 mg/dL (ref 7–25)
CALCIUM: 8.7 mg/dL (ref 8.6–10.3)
CHLORIDE: 95 mmol/L — AB (ref 98–110)
CO2: 23 mmol/L (ref 20–31)
Creat: 0.77 mg/dL (ref 0.70–1.11)
GFR, EST NON AFRICAN AMERICAN: 85 mL/min (ref >=60)
Glucose, Bld: 123 mg/dL — ABNORMAL HIGH (ref 70–99)
Potassium: 3.8 mmol/L (ref 3.5–5.3)
Sodium: 130 mmol/L — ABNORMAL LOW (ref 135–146)
Total Bilirubin: 1 mg/dL (ref 0.2–1.2)
Total Protein: 5.7 g/dL — ABNORMAL LOW (ref 6.1–8.1)

## 2016-06-29 LAB — BRAIN NATRIURETIC PEPTIDE: BRAIN NATRIURETIC PEPTIDE: 144.3 pg/mL — AB (ref <100)

## 2016-06-30 ENCOUNTER — Ambulatory Visit (INDEPENDENT_AMBULATORY_CARE_PROVIDER_SITE_OTHER): Payer: Commercial Managed Care - HMO | Admitting: Family Medicine

## 2016-06-30 ENCOUNTER — Encounter: Payer: Self-pay | Admitting: Family Medicine

## 2016-06-30 VITALS — BP 130/76 | HR 116 | Temp 97.7°F | Resp 24 | Wt 264.0 lb

## 2016-06-30 DIAGNOSIS — R0602 Shortness of breath: Secondary | ICD-10-CM

## 2016-06-30 LAB — BASIC METABOLIC PANEL WITH GFR
BUN: 12 mg/dL (ref 7–25)
CHLORIDE: 95 mmol/L — AB (ref 98–110)
CO2: 29 mmol/L (ref 20–31)
CREATININE: 0.83 mg/dL (ref 0.70–1.11)
Calcium: 8.9 mg/dL (ref 8.6–10.3)
GFR, Est African American: >89 mL/min (ref >=60)
GFR, Est Non African American: 82 mL/min (ref >=60)
GLUCOSE: 160 mg/dL — AB (ref 70–99)
POTASSIUM: 3.6 mmol/L (ref 3.5–5.3)
Sodium: 136 mmol/L (ref 135–146)

## 2016-06-30 NOTE — Progress Notes (Signed)
Subjective:    Patient ID: Austin Mesi., male    DOB: Jun 17, 1932, 82 y.o.   MRN: [id number]  HPI  06/28/16 States that for the last week, he has had a worsening cough productive of grey mucus. He also reports increasing shortness of breath, worsening chest congestion.  He states at times, he feels like he is "stopped up". He denies any pleurisy. He denies any hemoptysis. He denies any chest pain. He denies any fevers or chills. I have never heard the patient we like he is wheezing today. He has no history of tobacco abuse, emphysema, or asthma. Patient was given a 2.5 mg albuterol nebulizer treatment at 3:15. Oxygen saturation was 95% on room air. The patient is not in respiratory distress. He does have increased work of breathing.  Of note, he has gained substantial weight in the last 2 office visits. He has +2 pitting edema in both legs. He appears fluid overloaded. He also has diffuse rales in both lungs.  At that time, my plan was: Clinically, the patient appears to have both a bronchospasm with expiratory wheezing as well as fluid overload, pulmonary congestion, and pitting edema in his extremities with more than 25 pounds of weight gain since October. Patient was given a 2.5 mg albuterol nebulizer treatment immediately in the room. I will reassess the patient after 15 minutes to see if this is helping his breathing at all. I believe the patient is fluid overloaded and would benefit from diuresis. I will start the patient on Lasix 40 mg by mouth twice a day and recheck the patient tomorrow. I'll also send him for a chest x-ray. I will check a CBC, CMP, BNP  Subjectively, the patient felt like he benefited from albuterol.  Therefore I will start the patient on a prednisone taper pack given the wheezing that I'm hearing on his exam and also start the patient on albuterol 2 puffs inhaled every 4-6 hours as needed for wheezing. I will see the patient back in 48 hours or immediately if worse. He  is to go get a chest x-ray as soon as possible. I do not believe this is pneumonia but rather pulmonary edema however I want to exclude pneumonia. If there is any evidence of an infiltrate I will also add Levaquin sig medical attention immediately if symptoms worsen  06/30/16 Chest x-ray revealed no pneumonia but did show atelectasis and old granulomatous disease. Lab work was significant for an elevated BNP. Since taking Lasix twice a day, the patient has lost 10 pounds. His wheezing has improved dramatically on prednisone and albuterol. Overall the patient feels much better although he still has some shortness of breath. Past Medical History:  Diagnosis Date  . Arthritis    "little bit; left knee"  . Basal cell carcinoma   . Colon polyps    last colonoscopy 2015 (tubular adenoma)  . Diverticulosis   . Gastritis   . H/O: GI bleed    Recurrent  . Hemorrhoids   . History of BPH   . Hypertension   . Pernicious anemia   . PVC (premature ventricular contraction)   . Rectal bleeding 02/15/12  . Visual impairment   . Vitamin D deficiency    Past Surgical History:  Procedure Laterality Date  . [place]; ~ 2002   left; right   Current Outpatient Prescriptions on File Prior to Visit  Medication Sig Dispense Refill  . albuterol (PROVENTIL HFA;VENTOLIN HFA) 108 (90 Base) MCG/ACT  inhaler Inhale 2 puffs into the lungs every 6 (six) hours as needed for wheezing or shortness of breath. 1 Inhaler 0  . amLODipine (NORVASC) 10 MG tablet Take 1 tablet (10 mg total) by mouth daily. 90 tablet 3  . benazepril (LOTENSIN) 40 MG tablet Take 1 tablet (40 mg total) by mouth daily. 90 tablet 3  . cloNIDine (CATAPRES) 0.1 MG tablet TAKE 1 TABLET AT BEDTIME 90 tablet 3  . doxazosin (CARDURA) 4 MG tablet TAKE 1 TABLET EVERY DAY 90 tablet 3  . ferrous sulfate 325 (65 FE) MG tablet Take 325 mg by mouth daily with breakfast.    . furosemide (LASIX) 40 MG tablet Take 1 tablet (40 mg total) by  mouth 2 (two) times daily. 60 tablet 3  . hydrochlorothiazide (HYDRODIURIL) 25 MG tablet Take 1 tablet (25 mg total) by mouth daily. 90 tablet 3  . metoprolol succinate (TOPROL-XL) 50 MG 24 hr tablet Take 1 tablet (50 mg total) by mouth daily. Take with or immediately following a meal. 90 tablet 3  . Multiple Vitamin (MULTIVITAMIN WITH MINERALS) TABS Take 1 tablet by mouth daily.    . multivitamin-lutein (OCUVITE-LUTEIN) CAPS Take 1 capsule by mouth daily.    . predniSONE (DELTASONE) 20 MG tablet 3 tabs poqday 1-2, 2 tabs poqday 3-4, 1 tab poqday 5-6 12 tablet 0  . rivaroxaban (XARELTO) 20 MG TABS tablet Take 1 tablet (20 mg total) by mouth daily with supper. 90 tablet 3   Current Facility-Administered Medications on File Prior to Visit  Medication Dose Route Frequency Provider Last Rate Last Dose  . cyanocobalamin ((VITAMIN B-12)) injection 1,000 mcg  1,000 mcg Subcutaneous Q30 days [name], MD   1,000 mcg at 05/13/16 [id number]   No Known Allergies Social History   Social History  . Marital status: Widowed    Spouse name: N/A  . Number of children: 1  . Years of education: N/A   Occupational History  . Retired    Social History Main Topics  . Smoking status: Never Smoker  . Smokeless tobacco: Current User    Types: Snuff     Comment: 02/15/12 "still use snuff @ times; not daily"  . Alcohol use No  . Drug use: No  . Sexual activity: No     Comment: widower, lives with granddaughter and great grandchildren.   Other Topics Concern  . Not on file   Social History Narrative   Lives with grand daughter.         Epworth Sleepiness Scale = 7 (as of 09/21/2015)    Wt Readings from Last 3 Encounters:  06/30/16 264 lb (119.7 kg)  06/28/16 274 lb (124.3 kg)  05/19/16 262 lb (118.8 kg)     Review of Systems  All other systems reviewed and are negative.      Objective:   Physical Exam  Constitutional: He appears well-developed and well-nourished. No distress.  Neck: No  JVD present.  Cardiovascular: Normal rate.  An irregularly irregular rhythm present.  Pulmonary/Chest: Effort normal. No respiratory distress. He has decreased breath sounds. He has wheezes. He has rales.  Abdominal: Soft. Bowel sounds are normal.  Musculoskeletal: He exhibits edema.  Skin: He is not diaphoretic.  Vitals reviewed.       Assessment & Plan:  Shortness of breath - Plan: BASIC METABOLIC PANEL WITH GFR Decrease Lasix to 40 mg a day. Check BMP to monitor renal function and potassium. Recheck here on Monday. Complete prednisone. Use albuterol only  as needed and patient will likely need an echocardiogram to risk stratify once he is stable

## 2016-07-04 ENCOUNTER — Ambulatory Visit (INDEPENDENT_AMBULATORY_CARE_PROVIDER_SITE_OTHER): Payer: Commercial Managed Care - HMO | Admitting: Family Medicine

## 2016-07-04 ENCOUNTER — Encounter: Payer: Self-pay | Admitting: Family Medicine

## 2016-07-04 VITALS — BP 130/74 | HR 80 | Temp 98.4°F | Resp 20 | Ht 68.0 in | Wt 256.0 lb

## 2016-07-04 DIAGNOSIS — R635 Abnormal weight gain: Secondary | ICD-10-CM | POA: Diagnosis not present

## 2016-07-04 DIAGNOSIS — R0600 Dyspnea, unspecified: Secondary | ICD-10-CM | POA: Diagnosis not present

## 2016-07-04 DIAGNOSIS — I5030 Unspecified diastolic (congestive) heart failure: Secondary | ICD-10-CM | POA: Diagnosis not present

## 2016-07-04 MED ORDER — FUROSEMIDE 40 MG PO TABS: 40.0000 mg | ORAL_TABLET | Freq: Every day | ORAL | 5 refills | Status: DC

## 2016-07-04 MED ORDER — POTASSIUM CHLORIDE ER 10 MEQ PO TBCR: 10.0000 meq | EXTENDED_RELEASE_TABLET | Freq: Every day | ORAL | 5 refills | Status: DC

## 2016-07-04 NOTE — Progress Notes (Signed)
Subjective:    Patient ID: Austin Mesi., male    DOB: 1931-09-21, 82 y.o.   MRN: [id number]  HPI  06/28/16 States that for the last week, he has had a worsening cough productive of grey mucus. He also reports increasing shortness of breath, worsening chest congestion.  He states at times, he feels like he is "stopped up". He denies any pleurisy. He denies any hemoptysis. He denies any chest pain. He denies any fevers or chills. I have never heard the patient we like he is wheezing today. He has no history of tobacco abuse, emphysema, or asthma. Patient was given a 2.5 mg albuterol nebulizer treatment at 3:15. Oxygen saturation was 95% on room air. The patient is not in respiratory distress. He does have increased work of breathing.  Of note, he has gained substantial weight in the last 2 office visits. He has +2 pitting edema in both legs. He appears fluid overloaded. He also has diffuse rales in both lungs.  At that time, my plan was: Clinically, the patient appears to have both a bronchospasm with expiratory wheezing as well as fluid overload, pulmonary congestion, and pitting edema in his extremities with more than 25 pounds of weight gain since October. Patient was given a 2.5 mg albuterol nebulizer treatment immediately in the room. I will reassess the patient after 15 minutes to see if this is helping his breathing at all. I believe the patient is fluid overloaded and would benefit from diuresis. I will start the patient on Lasix 40 mg by mouth twice a day and recheck the patient tomorrow. I'll also send him for a chest x-ray. I will check a CBC, CMP, BNP  Subjectively, the patient felt like he benefited from albuterol.  Therefore I will start the patient on a prednisone taper pack given the wheezing that I'm hearing on his exam and also start the patient on albuterol 2 puffs inhaled every 4-6 hours as needed for wheezing. I will see the patient back in 48 hours or immediately if worse. He  is to go get a chest x-ray as soon as possible. I do not believe this is pneumonia but rather pulmonary edema however I want to exclude pneumonia. If there is any evidence of an infiltrate I will also add Levaquin sig medical attention immediately if symptoms worsen  06/30/16 Chest x-ray revealed no pneumonia but did show atelectasis and old granulomatous disease. Lab work was significant for an elevated BNP. Since taking Lasix twice a day, the patient has lost 10 pounds. His wheezing has improved dramatically on prednisone and albuterol. Overall the patient feels much better although he still has some shortness of breath.  AT that time, my plan was: Decrease Lasix to 40 mg a day. Check BMP to monitor renal function and potassium. Recheck here on Monday. Complete prednisone. Use albuterol only as needed and patient will likely need an echocardiogram to risk stratify once he is stable  07/04/16 He has lost 8 additional pounds, 18 pounds overall since when I first saw him. His breathing continues to improve. His wheezing has completely stopped. His cough has improved dramatically. He still has +1 pitting edema in both legs however his lungs are now much clear on exam. He denies any chest pain. He does still have some shortness of breath with moderate activity. I believe he has diastolic congestive heart failure stage II. Past Medical History:  Diagnosis Date  . Arthritis    "little bit; left knee"  .  Basal cell carcinoma   . Colon polyps    last colonoscopy 2015 (tubular adenoma)  . Diverticulosis   . Gastritis   . H/O: GI bleed    Recurrent  . Hemorrhoids   . History of BPH   . Hypertension   . Pernicious anemia   . PVC (premature ventricular contraction)   . Rectal bleeding 02/15/12  . Visual impairment   . Vitamin D deficiency    Past Surgical History:  Procedure Laterality Date  . [place]; ~ 2002   left; right   Current Outpatient Prescriptions on File Prior to  Visit  Medication Sig Dispense Refill  . albuterol (PROVENTIL HFA;VENTOLIN HFA) 108 (90 Base) MCG/ACT inhaler Inhale 2 puffs into the lungs every 6 (six) hours as needed for wheezing or shortness of breath. 1 Inhaler 0  . amLODipine (NORVASC) 10 MG tablet Take 1 tablet (10 mg total) by mouth daily. 90 tablet 3  . benazepril (LOTENSIN) 40 MG tablet Take 1 tablet (40 mg total) by mouth daily. 90 tablet 3  . cloNIDine (CATAPRES) 0.1 MG tablet TAKE 1 TABLET AT BEDTIME 90 tablet 3  . doxazosin (CARDURA) 4 MG tablet TAKE 1 TABLET EVERY DAY 90 tablet 3  . ferrous sulfate 325 (65 FE) MG tablet Take 325 mg by mouth daily with breakfast.    . metoprolol succinate (TOPROL-XL) 50 MG 24 hr tablet Take 1 tablet (50 mg total) by mouth daily. Take with or immediately following a meal. 90 tablet 3  . Multiple Vitamin (MULTIVITAMIN WITH MINERALS) TABS Take 1 tablet by mouth daily.    . multivitamin-lutein (OCUVITE-LUTEIN) CAPS Take 1 capsule by mouth daily.    . predniSONE (DELTASONE) 20 MG tablet 3 tabs poqday 1-2, 2 tabs poqday 3-4, 1 tab poqday 5-6 12 tablet 0  . rivaroxaban (XARELTO) 20 MG TABS tablet Take 1 tablet (20 mg total) by mouth daily with supper. 90 tablet 3   Current Facility-Administered Medications on File Prior to Visit  Medication Dose Route Frequency Provider Last Rate Last Dose  . cyanocobalamin ((VITAMIN B-12)) injection 1,000 mcg  1,000 mcg Subcutaneous Q30 days [name], MD   1,000 mcg at 05/13/16 [id number]   No Known Allergies Social History   Social History  . Marital status: Widowed    Spouse name: N/A  . Number of children: 1  . Years of education: N/A   Occupational History  . Retired    Social History Main Topics  . Smoking status: Never Smoker  . Smokeless tobacco: Current User    Types: Snuff     Comment: 02/15/12 "still use snuff @ times; not daily"  . Alcohol use No  . Drug use: No  . Sexual activity: No     Comment: widower, lives with granddaughter and great  grandchildren.   Other Topics Concern  . Not on file   Social History Narrative   Lives with grand daughter.         Epworth Sleepiness Scale = 7 (as of 09/21/2015)    Wt Readings from Last 3 Encounters:  07/04/16 256 lb (116.1 kg)  06/30/16 264 lb (119.7 kg)  06/28/16 274 lb (124.3 kg)     Review of Systems  All other systems reviewed and are negative.      Objective:   Physical Exam  Constitutional: He appears well-developed and well-nourished. No distress.  Neck: No JVD present.  Cardiovascular: Normal rate.  An irregularly irregular rhythm present.  Pulmonary/Chest:  Effort normal. No respiratory distress. He has decreased breath sounds. He has no wheezes. He has no rales.  Abdominal: Soft. Bowel sounds are normal.  Musculoskeletal: He exhibits edema.  Skin: He is not diaphoretic.  Vitals reviewed.       Assessment & Plan:  Diastolic congestive heart failure, unspecified congestive heart failure chronicity ([facility])  Dyspnea, unspecified type - Plan: furosemide (LASIX) 40 MG tablet  Weight gain - Plan: furosemide (LASIX) 40 MG tablet  Continue Lasix 40 mg a day indefinitely. Add Klor-Con 10 mEq by mouth daily indefinitely. Discontinue hydrochlorothiazide. Recheck the patient in one week and repeat renal function studies at that time. Echocardiogram was performed in March. Ejection fraction was 55-60%. Therefore I believe this is diastolic congestive heart failure. This is based on his elevated BNP, his pulmonary edema, his peripheral edema, his dyspnea on exertion. I believe he is stage II because he has shortness of breath now with moderate activity. Recheck in one week

## 2016-07-08 ENCOUNTER — Ambulatory Visit (INDEPENDENT_AMBULATORY_CARE_PROVIDER_SITE_OTHER): Payer: Commercial Managed Care - HMO | Admitting: Family Medicine

## 2016-07-08 ENCOUNTER — Encounter: Payer: Self-pay | Admitting: Family Medicine

## 2016-07-08 VITALS — BP 140/80 | HR 88 | Temp 98.4°F | Resp 22 | Ht 68.0 in | Wt 256.0 lb

## 2016-07-08 DIAGNOSIS — R635 Abnormal weight gain: Secondary | ICD-10-CM | POA: Diagnosis not present

## 2016-07-08 DIAGNOSIS — I5030 Unspecified diastolic (congestive) heart failure: Secondary | ICD-10-CM

## 2016-07-08 DIAGNOSIS — R0600 Dyspnea, unspecified: Secondary | ICD-10-CM

## 2016-07-08 DIAGNOSIS — R0602 Shortness of breath: Secondary | ICD-10-CM

## 2016-07-08 LAB — BASIC METABOLIC PANEL WITH GFR
BUN: 13 mg/dL (ref 7–25)
CALCIUM: 8.8 mg/dL (ref 8.6–10.3)
CO2: 31 mmol/L (ref 20–31)
Chloride: 98 mmol/L (ref 98–110)
Creat: 0.8 mg/dL (ref 0.70–1.11)
GFR, EST NON AFRICAN AMERICAN: 83 mL/min (ref >=60)
GLUCOSE: 106 mg/dL — AB (ref 70–99)
Potassium: 3.9 mmol/L (ref 3.5–5.3)
Sodium: 138 mmol/L (ref 135–146)

## 2016-07-08 MED ORDER — FUROSEMIDE 40 MG PO TABS: 40.0000 mg | ORAL_TABLET | Freq: Two times a day (BID) | ORAL | 5 refills | Status: DC

## 2016-07-08 NOTE — Progress Notes (Signed)
Subjective:    Patient ID: Austin Gray., male    DOB: [birth year], 82 y.o.   MRN: [id number]  HPI  06/28/16 States that for the last week, he has had a worsening cough productive of grey mucus. He also reports increasing shortness of breath, worsening chest congestion.  He states at times, he feels like he is "stopped up". He denies any pleurisy. He denies any hemoptysis. He denies any chest pain. He denies any fevers or chills. I have never heard the patient we like he is wheezing today. He has no history of tobacco abuse, emphysema, or asthma. Patient was given a 2.5 mg albuterol nebulizer treatment at 3:15. Oxygen saturation was 95% on room air. The patient is not in respiratory distress. He does have increased work of breathing.  Of note, he has gained substantial weight in the last 2 office visits. He has +2 pitting edema in both legs. He appears fluid overloaded. He also has diffuse rales in both lungs.  At that time, my plan was: Clinically, the patient appears to have both a bronchospasm with expiratory wheezing as well as fluid overload, pulmonary congestion, and pitting edema in his extremities with more than 25 pounds of weight gain since October. Patient was given a 2.5 mg albuterol nebulizer treatment immediately in the room. I will reassess the patient after 15 minutes to see if this is helping his breathing at all. I believe the patient is fluid overloaded and would benefit from diuresis. I will start the patient on Lasix 40 mg by mouth twice a day and recheck the patient tomorrow. I'll also send him for a chest x-ray. I will check a CBC, CMP, BNP  Subjectively, the patient felt like he benefited from albuterol.  Therefore I will start the patient on a prednisone taper pack given the wheezing that I'm hearing on his exam and also start the patient on albuterol 2 puffs inhaled every 4-6 hours as needed for wheezing. I will see the patient back in 48 hours or immediately if worse. He  is to go get a chest x-ray as soon as possible. I do not believe this is pneumonia but rather pulmonary edema however I want to exclude pneumonia. If there is any evidence of an infiltrate I will also add Levaquin sig medical attention immediately if symptoms worsen  06/30/16 Chest x-ray revealed no pneumonia but did show atelectasis and old granulomatous disease. Lab work was significant for an elevated BNP. Since taking Lasix twice a day, the patient has lost 10 pounds. His wheezing has improved dramatically on prednisone and albuterol. Overall the patient feels much better although he still has some shortness of breath.  AT that time, my plan was: Decrease Lasix to 40 mg a day. Check BMP to monitor renal function and potassium. Recheck here on Monday. Complete prednisone. Use albuterol only as needed and patient will likely need an echocardiogram to risk stratify once he is stable  07/04/16 He has lost 8 additional pounds, 18 pounds overall since when I first saw him. His breathing continues to improve. His wheezing has completely stopped. His cough has improved dramatically. He still has +1 pitting edema in both legs however his lungs are now much clear on exam. He denies any chest pain. He does still have some shortness of breath with moderate activity. I believe he has diastolic congestive heart failure stage II.  AT that time, my plan was: Continue Lasix 40 mg a day indefinitely. Add Klor-Con 10  mEq by mouth daily indefinitely. Discontinue hydrochlorothiazide. Recheck the patient in one week and repeat renal function studies at that time. Echocardiogram was performed in March. Ejection fraction was 55-60%. Therefore I believe this is diastolic congestive heart failure. This is based on his elevated BNP, his pulmonary edema, his peripheral edema, his dyspnea on exertion. I believe he is stage II because he has shortness of breath now with moderate activity. Recheck in one week  07/08/16 Wt Readings  from Last 3 Encounters:  07/08/16 256 lb (116.1 kg)  07/04/16 256 lb (116.1 kg)  06/30/16 264 lb (119.7 kg)   Fortunately the patient's weight is stable on 40 mg of Lasix. However he still has +1-2 pitting edema in both legs. In fact on his lower right shin, the skin has split open and is weeping yellow serous fluid. His breathing is much better but he still appears fluid overloaded. Apparently 40 mg a day of Lasix is not sufficient to maintain euvolemia. Past Medical History:  Diagnosis Date  . Arthritis    "little bit; left knee"  . Basal cell carcinoma   . CHF (congestive heart failure), NYHA class II, chronic, diastolic ([place])   . Colon polyps    last colonoscopy 2015 (tubular adenoma)  . Diverticulosis   . Gastritis   . H/O: GI bleed    Recurrent  . Hemorrhoids   . History of BPH   . Hypertension   . Pernicious anemia   . PVC (premature ventricular contraction)   . Rectal bleeding 02/15/12  . Visual impairment   . Vitamin D deficiency    Past Surgical History:  Procedure Laterality Date  . [place]; ~ 2002   left; right   Current Outpatient Prescriptions on File Prior to Visit  Medication Sig Dispense Refill  . albuterol (PROVENTIL HFA;VENTOLIN HFA) 108 (90 Base) MCG/ACT inhaler Inhale 2 puffs into the lungs every 6 (six) hours as needed for wheezing or shortness of breath. 1 Inhaler 0  . amLODipine (NORVASC) 10 MG tablet Take 1 tablet (10 mg total) by mouth daily. 90 tablet 3  . benazepril (LOTENSIN) 40 MG tablet Take 1 tablet (40 mg total) by mouth daily. 90 tablet 3  . cloNIDine (CATAPRES) 0.1 MG tablet TAKE 1 TABLET AT BEDTIME 90 tablet 3  . doxazosin (CARDURA) 4 MG tablet TAKE 1 TABLET EVERY DAY 90 tablet 3  . ferrous sulfate 325 (65 FE) MG tablet Take 325 mg by mouth daily with breakfast.    . furosemide (LASIX) 40 MG tablet Take 1 tablet (40 mg total) by mouth daily. 30 tablet 5  . metoprolol succinate (TOPROL-XL) 50 MG 24 hr tablet Take 1 tablet  (50 mg total) by mouth daily. Take with or immediately following a meal. 90 tablet 3  . Multiple Vitamin (MULTIVITAMIN WITH MINERALS) TABS Take 1 tablet by mouth daily.    . multivitamin-lutein (OCUVITE-LUTEIN) CAPS Take 1 capsule by mouth daily.    . potassium chloride (KLOR-CON 10) 10 MEQ tablet Take 1 tablet (10 mEq total) by mouth daily. 30 tablet 5  . rivaroxaban (XARELTO) 20 MG TABS tablet Take 1 tablet (20 mg total) by mouth daily with supper. 90 tablet 3   Current Facility-Administered Medications on File Prior to Visit  Medication Dose Route Frequency Provider Last Rate Last Dose  . cyanocobalamin ((VITAMIN B-12)) injection 1,000 mcg  1,000 mcg Subcutaneous Q30 days [name], MD   1,000 mcg at 05/13/16 0921   No Known  Allergies Social History   Social History  . Marital status: Widowed    Spouse name: N/A  . Number of children: 1  . Years of education: N/A   Occupational History  . Retired    Social History Main Topics  . Smoking status: Never Smoker  . Smokeless tobacco: Current User    Types: Snuff     Comment: 02/15/12 "still use snuff @ times; not daily"  . Alcohol use No  . Drug use: No  . Sexual activity: No     Comment: widower, lives with granddaughter and great grandchildren.   Other Topics Concern  . Not on file   Social History Narrative   Lives with grand daughter.         Epworth Sleepiness Scale = 7 (as of 09/21/2015)    Wt Readings from Last 3 Encounters:  07/04/16 256 lb (116.1 kg)  06/30/16 264 lb (119.7 kg)  06/28/16 274 lb (124.3 kg)     Review of Systems  All other systems reviewed and are negative.      Objective:   Physical Exam  Constitutional: He appears well-developed and well-nourished. No distress.  Neck: No JVD present.  Cardiovascular: Normal rate.  An irregularly irregular rhythm present.  Pulmonary/Chest: Effort normal. No respiratory distress. He has decreased breath sounds. He has no wheezes. He has no rales.    Abdominal: Soft. Bowel sounds are normal.  Musculoskeletal: He exhibits edema.  Skin: He is not diaphoretic.  Vitals reviewed.       Assessment & Plan:  Dyspnea, unspecified type  Diastolic congestive heart failure, unspecified congestive heart failure chronicity (HCC)  Shortness of breath Increase Lasix to 40 mg twice a day. Continue Klor-Con 10 mEq a day. Recheck renal function and potassium today. I believe this will be the patient's new baseline dose to maintain his euvolemic state. Recheck in one month or as needed

## 2016-07-15 ENCOUNTER — Ambulatory Visit: Payer: Commercial Managed Care - HMO | Admitting: Family Medicine

## 2016-07-19 ENCOUNTER — Telehealth: Payer: Self-pay | Admitting: Family Medicine

## 2016-07-19 DIAGNOSIS — R0600 Dyspnea, unspecified: Secondary | ICD-10-CM

## 2016-07-19 DIAGNOSIS — R635 Abnormal weight gain: Secondary | ICD-10-CM

## 2016-07-19 MED ORDER — POTASSIUM CHLORIDE ER 10 MEQ PO TBCR: 10.0000 meq | EXTENDED_RELEASE_TABLET | Freq: Every day | ORAL | 3 refills | Status: DC

## 2016-07-19 MED ORDER — FUROSEMIDE 40 MG PO TABS: 40.0000 mg | ORAL_TABLET | Freq: Two times a day (BID) | ORAL | 3 refills | Status: DC

## 2016-07-19 NOTE — Telephone Encounter (Signed)
Patient calling to get refills on his klor-con and furosemide to go to the [facility]

## 2016-07-19 NOTE — Telephone Encounter (Signed)
Patient in office for weight check. Noted at 254lbs.   Patient medication sent to pharmacy.   Patient also states that he requires order for compression hose. Ok to order?

## 2016-07-19 NOTE — Telephone Encounter (Signed)
Ok to order hose.

## 2016-07-19 NOTE — Telephone Encounter (Signed)
Weight is stable no change in meds.

## 2016-07-19 NOTE — Telephone Encounter (Signed)
Prescription printed for hose.   Patient made aware and will pick up after 2pm on 07/19/2016.  Patient also made aware of MD recommendations.

## 2016-07-19 NOTE — Telephone Encounter (Signed)
Please advise on compression hose.

## 2016-08-10 ENCOUNTER — Ambulatory Visit (INDEPENDENT_AMBULATORY_CARE_PROVIDER_SITE_OTHER): Payer: Medicare HMO | Admitting: Family Medicine

## 2016-08-10 VITALS — Wt 246.0 lb

## 2016-08-10 DIAGNOSIS — D519 Vitamin B12 deficiency anemia, unspecified: Secondary | ICD-10-CM | POA: Diagnosis not present

## 2016-08-10 DIAGNOSIS — E538 Deficiency of other specified B group vitamins: Secondary | ICD-10-CM | POA: Diagnosis not present

## 2016-09-21 ENCOUNTER — Other Ambulatory Visit: Payer: Medicare HMO

## 2016-09-21 ENCOUNTER — Ambulatory Visit (INDEPENDENT_AMBULATORY_CARE_PROVIDER_SITE_OTHER): Payer: Medicare HMO | Admitting: Family Medicine

## 2016-09-21 DIAGNOSIS — E538 Deficiency of other specified B group vitamins: Secondary | ICD-10-CM | POA: Diagnosis not present

## 2016-09-21 DIAGNOSIS — D519 Vitamin B12 deficiency anemia, unspecified: Secondary | ICD-10-CM

## 2016-10-02 NOTE — Progress Notes (Signed)
Cardiology Office Note   Date:  10/03/2016   ID:  Austin Mesi., DOB 1931/10/21, MRN [id number]  PCP:  Odette Fraction, MD  Cardiologist:   Minus Breeding, MD   Chief Complaint  Patient presents with  . Atrial Fibrillation      History of Present Illness: Austin Corigliano. is a 83 y.o. male who presents for evaluation of atrial fib.  He's done reasonably well from a cardiovascular standpoint. He doesn't really notice his fibrillation. He's had no problems with his blood thinner. However, he tells me he gained a lot of weight quickly over the winter and started out as flu. He saw [facility]OM, MD and was thought to have acute on chronic diastolic heart failure. He was treated with increased diuresis and lost about 9 pounds quickly and started feeling better again. He's now he thinks back to his baseline. He's wearing some compression stockings which are new and this helps. He's not describing any resting shortness of breath, PND or orthopnea. He's not having new palpitations, presyncope or syncope. He denies any chest pressure, neck or arm discomfort.   Past Medical History:  Diagnosis Date  . Arthritis    "little bit; left knee"  . Basal cell carcinoma   . CHF (congestive heart failure), NYHA class II, chronic, diastolic ([place])   . Colon polyps    last colonoscopy 2015 (tubular adenoma)  . Diverticulosis   . Gastritis   . H/O: GI bleed    Recurrent  . Hemorrhoids   . History of BPH   . Hypertension   . Pernicious anemia   . PVC (premature ventricular contraction)   . Rectal bleeding 02/15/12  . Visual impairment   . Vitamin D deficiency     Past Surgical History:  Procedure Laterality Date  . [place]; ~ 2002   left; right     Current Outpatient Prescriptions  Medication Sig Dispense Refill  . albuterol (PROVENTIL HFA;VENTOLIN HFA) 108 (90 Base) MCG/ACT inhaler Inhale 2 puffs into the lungs every 6 (six) hours as needed for  wheezing or shortness of breath. 1 Inhaler 0  . amLODipine (NORVASC) 10 MG tablet Take 1 tablet (10 mg total) by mouth daily. 90 tablet 3  . benazepril (LOTENSIN) 40 MG tablet Take 1 tablet (40 mg total) by mouth daily. 90 tablet 3  . cloNIDine (CATAPRES) 0.1 MG tablet TAKE 1 TABLET AT BEDTIME 90 tablet 3  . doxazosin (CARDURA) 4 MG tablet TAKE 1 TABLET EVERY DAY 90 tablet 3  . ferrous sulfate 325 (65 FE) MG tablet Take 325 mg by mouth every other day.     . furosemide (LASIX) 40 MG tablet Take 1 tablet (40 mg total) by mouth 2 (two) times daily. 180 tablet 3  . metoprolol succinate (TOPROL-XL) 50 MG 24 hr tablet Take 1 tablet (50 mg total) by mouth daily. Take with or immediately following a meal. 90 tablet 3  . Multiple Vitamin (MULTIVITAMIN WITH MINERALS) TABS Take 1 tablet by mouth daily.    . multivitamin-lutein (OCUVITE-LUTEIN) CAPS Take 1 capsule by mouth daily.    . potassium chloride (KLOR-CON 10) 10 MEQ tablet Take 1 tablet (10 mEq total) by mouth daily. 90 tablet 3  . rivaroxaban (XARELTO) 20 MG TABS tablet Take 1 tablet (20 mg total) by mouth daily with supper. 90 tablet 3   Current Facility-Administered Medications  Medication Dose Route Frequency Provider Last Rate Last Dose  . cyanocobalamin ((VITAMIN  B-12)) injection 1,000 mcg  1,000 mcg Subcutaneous Q30 days [name], MD   1,000 mcg at 09/21/16 1216    Allergies:   Patient has no known allergies.    ROS:  Please see the history of present illness.   Otherwise, review of systems are positive for none.   All other systems are reviewed and negative.    PHYSICAL EXAM: VS:  BP 140/88 (BP Location: Left Arm, Patient Position: Sitting, Cuff Size: Normal)   Pulse 64   Ht 5\' 10"  (1.778 m)   Wt 250 lb (113.4 kg)   BMI 35.87 kg/m  , BMI Body mass index is 35.87 kg/m. GENERAL:  Well appearing NECK:  No jugular venous distention, waveform within normal limits, carotid upstroke brisk and symmetric, no bruits, no  thyromegaly LUNGS:  Clear to auscultation bilaterally CHEST:  Unremarkable HEART:  PMI not displaced or sustained,S1 and S2 within normal limits, no S3, no clicks, no rubs, 3/6 apical systolic murmur at the apex and radiating to the axilla.  No diastolic murmurs.  Irregular ABD:  Flat, positive bowel sounds normal in frequency in pitch, no bruits, no rebound, no guarding, no midline pulsatile mass, no hepatomegaly, no splenomegaly, positive umbilical hernia.  EXT:  2 plus pulses throughout, mild edema, no cyanosis no clubbing   EKG:  EKG is not not ordered today.    Recent Labs: 06/28/2016: ALT 30; Brain Natriuretic Peptide 144.3; Hemoglobin 11.4; Platelets 136 07/08/2016: BUN 13; Creat 0.80; Potassium 3.9; Sodium 138    Lipid Panel    Component Value Date/Time   CHOL 176 04/08/2016 0823   TRIG 57 04/08/2016 0823   HDL 64 04/08/2016 0823   CHOLHDL 2.8 04/08/2016 0823   VLDL 11 04/08/2016 0823   LDLCALC 101 04/08/2016 0823      Wt Readings from Last 3 Encounters:  10/03/16 250 lb (113.4 kg)  08/10/16 246 lb (111.6 kg)  07/08/16 256 lb (116.1 kg)      Other studies Reviewed: Additional studies/ records that were reviewed today include: PICKARD,WARREN TOM, MD office records Review of the above records demonstrates:  See above    ASSESSMENT AND PLAN:  ATRIAL FIB:  He is completely asymptomatic with this rhythm.  He tolerates anticoagulation.   This patients CHA2DS2-VASc Score and unadjusted Ischemic Stroke Rate (% per year) is equal to 3.2 % stroke rate/year from a score of 3.  He will continue with meds as listed.  No change in therapy.   MR:   He has moderate MR which I will follow clinically.   No repeat imaging is indicated at this time.   EDEMA:  The meds are as noted late last year when he had increased weight. He's now on increased diuretic he'll continue this. I will check a basic metabolic profile.   OBESITY:  The patient understands the need to lose weight with  diet and exercise. We have discussed specific strategies for this again today.  HTN:   The blood pressure is at target. No change in medications is indicated. We will continue with therapeutic lifestyle changes (TLC).  Current medicines are reviewed at length with the patient today.  The patient does not have concerns regarding medicines.  The following changes have been made:  None  Labs/ tests ordered today include:    Orders Placed This Encounter  Procedures  . CBC  . Basic Metabolic Panel (BMET)     Disposition:   FU with me in 6 months.  Signed, Minus Breeding, MD  10/03/2016 8:30 PM    [place]

## 2016-10-03 ENCOUNTER — Ambulatory Visit (INDEPENDENT_AMBULATORY_CARE_PROVIDER_SITE_OTHER): Payer: Medicare HMO | Admitting: Cardiology

## 2016-10-03 ENCOUNTER — Encounter: Payer: Self-pay | Admitting: Cardiology

## 2016-10-03 VITALS — BP 140/88 | HR 64 | Ht 70.0 in | Wt 250.0 lb

## 2016-10-03 DIAGNOSIS — I340 Nonrheumatic mitral (valve) insufficiency: Secondary | ICD-10-CM

## 2016-10-03 DIAGNOSIS — M7989 Other specified soft tissue disorders: Secondary | ICD-10-CM | POA: Diagnosis not present

## 2016-10-03 DIAGNOSIS — Z79899 Other long term (current) drug therapy: Secondary | ICD-10-CM

## 2016-10-03 DIAGNOSIS — I10 Essential (primary) hypertension: Secondary | ICD-10-CM

## 2016-10-03 DIAGNOSIS — I4819 Other persistent atrial fibrillation: Secondary | ICD-10-CM

## 2016-10-03 DIAGNOSIS — I481 Persistent atrial fibrillation: Secondary | ICD-10-CM

## 2016-10-03 LAB — BASIC METABOLIC PANEL
BUN: 12 mg/dL (ref 7–25)
CALCIUM: 9.6 mg/dL (ref 8.6–10.3)
CHLORIDE: 103 mmol/L (ref 98–110)
CO2: 26 mmol/L (ref 20–31)
CREATININE: 0.84 mg/dL (ref 0.70–1.11)
GLUCOSE: 103 mg/dL — AB (ref 65–99)
Potassium: 3.9 mmol/L (ref 3.5–5.3)
Sodium: 140 mmol/L (ref 135–146)

## 2016-10-03 LAB — CBC
HEMATOCRIT: 40 % (ref 38.5–50.0)
Hemoglobin: 13.3 g/dL (ref 13.2–17.1)
MCH: 27.7 pg (ref 27.0–33.0)
MCHC: 33.3 g/dL (ref 32.0–36.0)
MCV: 83.3 fL (ref 80.0–100.0)
PLATELETS: 153 10*3/uL (ref 140–400)
RBC: 4.8 MIL/uL (ref 4.20–5.80)
RDW: 15.9 % — ABNORMAL HIGH (ref 11.0–15.0)
WBC: 7.9 10*3/uL (ref 3.8–10.8)

## 2016-10-03 NOTE — Patient Instructions (Signed)
Medication Instructions:  Continue current medications  Labwork: CBC and BMP  Testing/Procedures: None Ordered  Follow-Up: Your physician wants you to follow-up in: 6 Months. You will receive a reminder letter in the mail two months in advance. If you don't receive a letter, please call our office to schedule the follow-up appointment.   Any Other Special Instructions Will Be Listed Below (If Applicable).   If you need a refill on your cardiac medications before your next appointment, please call your pharmacy.

## 2016-10-06 ENCOUNTER — Encounter: Payer: Self-pay | Admitting: *Deleted

## 2016-10-24 ENCOUNTER — Ambulatory Visit (INDEPENDENT_AMBULATORY_CARE_PROVIDER_SITE_OTHER): Payer: Medicare HMO | Admitting: Family Medicine

## 2016-10-24 ENCOUNTER — Other Ambulatory Visit: Payer: Self-pay | Admitting: Family Medicine

## 2016-10-24 VITALS — Wt 252.0 lb

## 2016-10-24 DIAGNOSIS — E538 Deficiency of other specified B group vitamins: Secondary | ICD-10-CM

## 2016-10-24 DIAGNOSIS — D519 Vitamin B12 deficiency anemia, unspecified: Secondary | ICD-10-CM

## 2016-11-25 ENCOUNTER — Ambulatory Visit (INDEPENDENT_AMBULATORY_CARE_PROVIDER_SITE_OTHER): Payer: Medicare HMO | Admitting: Family Medicine

## 2016-11-25 VITALS — BP 142/78 | HR 98 | Temp 98.0°F | Resp 24 | Ht 68.0 in | Wt 258.0 lb

## 2016-11-25 DIAGNOSIS — D519 Vitamin B12 deficiency anemia, unspecified: Secondary | ICD-10-CM

## 2016-11-25 DIAGNOSIS — R0602 Shortness of breath: Secondary | ICD-10-CM | POA: Diagnosis not present

## 2016-11-25 LAB — CBC WITH DIFFERENTIAL/PLATELET
BASOS ABS: 67 {cells}/uL (ref 0–200)
Basophils Relative: 1 %
Eosinophils Absolute: 67 cells/uL (ref 15–500)
Eosinophils Relative: 1 %
HEMATOCRIT: 39.3 % (ref 38.5–50.0)
Hemoglobin: 12.9 g/dL — ABNORMAL LOW (ref 13.0–17.0)
LYMPHS PCT: 17 %
Lymphs Abs: 1139 cells/uL (ref 850–3900)
MCH: 28 pg (ref 27.0–33.0)
MCHC: 32.8 g/dL (ref 32.0–36.0)
MCV: 85.4 fL (ref 80.0–100.0)
MONO ABS: 737 {cells}/uL (ref 200–950)
MPV: 11.4 fL (ref 7.5–12.5)
Monocytes Relative: 11 %
NEUTROS PCT: 70 %
Neutro Abs: 4690 cells/uL (ref 1500–7800)
Platelets: 123 10*3/uL — ABNORMAL LOW (ref 140–400)
RBC: 4.6 MIL/uL (ref 4.20–5.80)
RDW: 15.6 % — AB (ref 11.0–15.0)
WBC: 6.7 10*3/uL (ref 3.8–10.8)

## 2016-11-25 LAB — BASIC METABOLIC PANEL WITH GFR
BUN: 10 mg/dL (ref 7–25)
CO2: 27 mmol/L (ref 20–31)
Calcium: 9.2 mg/dL (ref 8.6–10.3)
Chloride: 104 mmol/L (ref 98–110)
Creat: 0.92 mg/dL (ref 0.70–1.11)
GFR, EST AFRICAN AMERICAN: 89 mL/min (ref >=60)
GFR, Est Non African American: 77 mL/min (ref >=60)
GLUCOSE: 99 mg/dL (ref 70–99)
Potassium: 3.7 mmol/L (ref 3.5–5.3)
Sodium: 140 mmol/L (ref 135–146)

## 2016-11-25 NOTE — Progress Notes (Signed)
Subjective:    Patient ID: Austin Gray., male    DOB: [birth year], 83 y.o.   MRN: [id number]  HPI Over the past month, the patient has gained approximately 6 pounds on our scale. He reports increasing shortness of breath. Increasing swelling. On examination today he does have +1 pitting edema in both legs. He is currently taking Lasix 40 mg twice a day. He also is in atrial fibrillation which is chronic for him but his heart rate is well above 100. I calculated to be approximately 110 bpm to 120 bpm. He does report chest tightness but denies any pain. Past Medical History:  Diagnosis Date  . Arthritis    "little bit; left knee"  . Basal cell carcinoma   . CHF (congestive heart failure), NYHA class II, chronic, diastolic ([place])   . Colon polyps    last colonoscopy 2015 (tubular adenoma)  . Diverticulosis   . Gastritis   . H/O: GI bleed    Recurrent  . Hemorrhoids   . History of BPH   . Hypertension   . Pernicious anemia   . PVC (premature ventricular contraction)   . Rectal bleeding 02/15/12  . Visual impairment   . Vitamin D deficiency    Past Surgical History:  Procedure Laterality Date  . [place]; ~ 2002   left; right   Current Outpatient Prescriptions on File Prior to Visit  Medication Sig Dispense Refill  . albuterol (PROVENTIL HFA;VENTOLIN HFA) 108 (90 Base) MCG/ACT inhaler Inhale 2 puffs into the lungs every 6 (six) hours as needed for wheezing or shortness of breath. 1 Inhaler 0  . amLODipine (NORVASC) 10 MG tablet Take 1 tablet (10 mg total) by mouth daily. 90 tablet 3  . benazepril (LOTENSIN) 40 MG tablet Take 1 tablet (40 mg total) by mouth daily. 90 tablet 3  . cloNIDine (CATAPRES) 0.1 MG tablet TAKE 1 TABLET AT BEDTIME 90 tablet 3  . doxazosin (CARDURA) 4 MG tablet TAKE 1 TABLET EVERY DAY 90 tablet 3  . ferrous sulfate 325 (65 FE) MG tablet Take 325 mg by mouth every other day.     . furosemide (LASIX) 40 MG tablet Take 1 tablet (40 mg  total) by mouth 2 (two) times daily. 180 tablet 3  . metoprolol succinate (TOPROL-XL) 50 MG 24 hr tablet Take 1 tablet (50 mg total) by mouth daily. Take with or immediately following a meal. 90 tablet 3  . Multiple Vitamin (MULTIVITAMIN WITH MINERALS) TABS Take 1 tablet by mouth daily.    . multivitamin-lutein (OCUVITE-LUTEIN) CAPS Take 1 capsule by mouth daily.    . potassium chloride (KLOR-CON 10) 10 MEQ tablet Take 1 tablet (10 mEq total) by mouth daily. 90 tablet 3  . XARELTO 20 MG TABS tablet TAKE 1 TABLET (20 MG TOTAL) BY MOUTH DAILY WITH SUPPER. 90 tablet 3   Current Facility-Administered Medications on File Prior to Visit  Medication Dose Route Frequency Provider Last Rate Last Dose  . cyanocobalamin ((VITAMIN B-12)) injection 1,000 mcg  1,000 mcg Subcutaneous Q30 days [name], MD   1,000 mcg at 10/24/16 1045   No Known Allergies Social History   Social History  . Marital status: Widowed    Spouse name: N/A  . Number of children: 1  . Years of education: N/A   Occupational History  . Retired    Social History Main Topics  . Smoking status: Never Smoker  . Smokeless tobacco: Current User  Types: Snuff     Comment: 02/15/12 "still use snuff @ times; not daily"  . Alcohol use No  . Drug use: No  . Sexual activity: No     Comment: widower, lives with granddaughter and great grandchildren.   Other Topics Concern  . Not on file   Social History Narrative   Lives with grand daughter.         Epworth Sleepiness Scale = 7 (as of 09/21/2015)      Review of Systems  All other systems reviewed and are negative.      Objective:   Physical Exam  Constitutional: He appears well-developed and well-nourished.  Neck: No JVD present. No thyromegaly present.  Cardiovascular: An irregularly irregular rhythm present. Tachycardia present.   Pulmonary/Chest: No accessory muscle usage. No respiratory distress. He has no decreased breath sounds. He has no wheezes. He  has rhonchi in the right lower field and the left lower field.          Assessment & Plan:  Shortness of breath - Plan: Brain natriuretic peptide, CBC with Differential/Platelet, BASIC METABOLIC PANEL WITH GFR  I believe his shortness of breath is multifactorial and related to tachycardia due to atrial fibrillation plus mild pulmonary edema and fluid retention. I will have him increase his Lasix to 80 mg in the morning and 40 mg at night temporarily. I'll have him increase his Toprol to 100 mg in the morning. Recheck the patient on Tuesday. Check CBC to evaluate for anemia as he does have a history of lower GI bleed and he is on Xarelto for atrial fibrillation. I will also check a BNP as well as a BMP.

## 2016-11-26 LAB — BRAIN NATRIURETIC PEPTIDE: BRAIN NATRIURETIC PEPTIDE: 172.2 pg/mL — AB (ref <100)

## 2016-11-29 ENCOUNTER — Encounter: Payer: Self-pay | Admitting: Family Medicine

## 2016-11-29 ENCOUNTER — Ambulatory Visit (INDEPENDENT_AMBULATORY_CARE_PROVIDER_SITE_OTHER): Payer: Medicare HMO | Admitting: Family Medicine

## 2016-11-29 VITALS — BP 130/90 | HR 84 | Temp 98.1°F | Resp 22 | Ht 68.0 in | Wt 256.0 lb

## 2016-11-29 DIAGNOSIS — R635 Abnormal weight gain: Secondary | ICD-10-CM

## 2016-11-29 DIAGNOSIS — R0600 Dyspnea, unspecified: Secondary | ICD-10-CM | POA: Diagnosis not present

## 2016-11-29 DIAGNOSIS — I4891 Unspecified atrial fibrillation: Secondary | ICD-10-CM | POA: Diagnosis not present

## 2016-11-29 MED ORDER — FUROSEMIDE 40 MG PO TABS: 40.0000 mg | ORAL_TABLET | Freq: Two times a day (BID) | ORAL | 3 refills | Status: DC

## 2016-11-29 MED ORDER — METOPROLOL SUCCINATE ER 50 MG PO TB24
ORAL_TABLET | ORAL | 3 refills | Status: DC
Start: 2016-11-29 — End: 2016-12-01

## 2016-11-29 NOTE — Progress Notes (Signed)
Subjective:    Patient ID: Austin Mesi., male    DOB: [birth year], 83 y.o.   MRN: [id number]  HPI  11/25/16 Over the past month, the patient has gained approximately 6 pounds on our scale. He reports increasing shortness of breath. Increasing swelling. On examination today he does have +1 pitting edema in both legs. He is currently taking Lasix 40 mg twice a day. He also is in atrial fibrillation which is chronic for him but his heart rate is well above 100. I calculated to be approximately 110 bpm to 120 bpm. He does report chest tightness but denies any pain.  At that time, my plan was: I believe his shortness of breath is multifactorial and related to tachycardia due to atrial fibrillation plus mild pulmonary edema and fluid retention. I will have him increase his Lasix to 80 mg in the morning and 40 mg at night temporarily. I'll have him increase his Toprol to 100 mg in the morning. Recheck the patient on Tuesday. Check CBC to evaluate for anemia as he does have a history of lower GI bleed and he is on Xarelto for atrial fibrillation. I will also check a BNP as well as a BMP.  11/29/16 Lab work was normal aside from a slight elevation in his BNP. There was no anemia. His breathing is better after diuresing over the weekend. He did lose 2 pounds. The pitting edema is better in his legs and his heart rate is better controlled today in the low 80s. Overall he appears better. It has been a gradual slow improvement. Past Medical History:  Diagnosis Date  . Arthritis    "little bit; left knee"  . Basal cell carcinoma   . CHF (congestive heart failure), NYHA class II, chronic, diastolic ([place])   . Colon polyps    last colonoscopy 2015 (tubular adenoma)  . Diverticulosis   . Gastritis   . H/O: GI bleed    Recurrent  . Hemorrhoids   . History of BPH   . Hypertension   . Pernicious anemia   . PVC (premature ventricular contraction)   . Rectal bleeding 02/15/12  . Visual impairment   .  Vitamin D deficiency    Past Surgical History:  Procedure Laterality Date  . [place]; ~ 2002   left; right   Current Outpatient Prescriptions on File Prior to Visit  Medication Sig Dispense Refill  . albuterol (PROVENTIL HFA;VENTOLIN HFA) 108 (90 Base) MCG/ACT inhaler Inhale 2 puffs into the lungs every 6 (six) hours as needed for wheezing or shortness of breath. 1 Inhaler 0  . amLODipine (NORVASC) 10 MG tablet Take 1 tablet (10 mg total) by mouth daily. 90 tablet 3  . benazepril (LOTENSIN) 40 MG tablet Take 1 tablet (40 mg total) by mouth daily. 90 tablet 3  . cloNIDine (CATAPRES) 0.1 MG tablet TAKE 1 TABLET AT BEDTIME 90 tablet 3  . doxazosin (CARDURA) 4 MG tablet TAKE 1 TABLET EVERY DAY 90 tablet 3  . ferrous sulfate 325 (65 FE) MG tablet Take 325 mg by mouth every other day.     . Multiple Vitamin (MULTIVITAMIN WITH MINERALS) TABS Take 1 tablet by mouth daily.    . multivitamin-lutein (OCUVITE-LUTEIN) CAPS Take 1 capsule by mouth daily.    . potassium chloride (KLOR-CON 10) 10 MEQ tablet Take 1 tablet (10 mEq total) by mouth daily. 90 tablet 3  . XARELTO 20 MG TABS tablet TAKE 1 TABLET (20 MG TOTAL)  BY MOUTH DAILY WITH SUPPER. 90 tablet 3   Current Facility-Administered Medications on File Prior to Visit  Medication Dose Route Frequency Provider Last Rate Last Dose  . cyanocobalamin ((VITAMIN B-12)) injection 1,000 mcg  1,000 mcg Subcutaneous Q30 days [name], MD   1,000 mcg at 11/25/16 1225   No Known Allergies Social History   Social History  . Marital status: Widowed    Spouse name: N/A  . Number of children: 1  . Years of education: N/A   Occupational History  . Retired    Social History Main Topics  . Smoking status: Never Smoker  . Smokeless tobacco: Current User    Types: Snuff     Comment: 02/15/12 "still use snuff @ times; not daily"  . Alcohol use No  . Drug use: No  . Sexual activity: No     Comment: widower, lives with  granddaughter and great grandchildren.   Other Topics Concern  . Not on file   Social History Narrative   Lives with grand daughter.         Epworth Sleepiness Scale = 7 (as of 09/21/2015)      Review of Systems  All other systems reviewed and are negative.      Objective:   Physical Exam  Constitutional: He appears well-developed and well-nourished.  Neck: No JVD present. No thyromegaly present.  Cardiovascular: An irregularly irregular rhythm present. Tachycardia present.   Pulmonary/Chest: No accessory muscle usage. No respiratory distress. He has no decreased breath sounds. He has no wheezes. He has rhonchi in the right lower field and the left lower field.  Musculoskeletal: He exhibits edema.          Assessment & Plan:  Atrial fibrillation, unspecified type (HCC)  Dyspnea, unspecified type - Plan: furosemide (LASIX) 40 MG tablet  Weight gain - Plan: furosemide (LASIX) 40 MG tablet  Patient still has trace bipedal edema and faint right basilar crackles. I still believe that there is more fluid that can be diuresed. Therefore I want to leave the patient on his current dose of metoprolol as this seems to be controlling his heart rate better and also leave the patient on Lasix 80 mg in the morning and 40 mg in the evening. I believe this would be an appropriate maintenance dose for this gentleman moving forward

## 2016-12-01 ENCOUNTER — Other Ambulatory Visit: Payer: Self-pay | Admitting: Family Medicine

## 2016-12-01 DIAGNOSIS — R0600 Dyspnea, unspecified: Secondary | ICD-10-CM

## 2016-12-01 DIAGNOSIS — R635 Abnormal weight gain: Secondary | ICD-10-CM

## 2016-12-01 MED ORDER — FUROSEMIDE 40 MG PO TABS: ORAL_TABLET | ORAL | 3 refills | Status: DC

## 2016-12-01 MED ORDER — METOPROLOL SUCCINATE ER 50 MG PO TB24: 100.0000 mg | ORAL_TABLET | Freq: Every day | ORAL | 3 refills | Status: DC

## 2016-12-19 ENCOUNTER — Encounter (INDEPENDENT_AMBULATORY_CARE_PROVIDER_SITE_OTHER): Payer: Self-pay

## 2016-12-19 ENCOUNTER — Ambulatory Visit (INDEPENDENT_AMBULATORY_CARE_PROVIDER_SITE_OTHER): Payer: Commercial Managed Care - HMO | Admitting: Ophthalmology

## 2017-01-06 ENCOUNTER — Ambulatory Visit (INDEPENDENT_AMBULATORY_CARE_PROVIDER_SITE_OTHER): Payer: Medicare HMO | Admitting: Family Medicine

## 2017-01-06 DIAGNOSIS — D519 Vitamin B12 deficiency anemia, unspecified: Secondary | ICD-10-CM

## 2017-01-06 MED ORDER — CYANOCOBALAMIN 1000 MCG/ML IJ SOLN
1000.0000 ug | INTRAMUSCULAR | Status: DC
  Administered 2017-01-06 – 2019-03-22 (×18): 1000 ug via INTRAMUSCULAR

## 2017-01-06 NOTE — Patient Instructions (Signed)
Pt came in for B12 today. Given in Left deltoid

## 2017-01-25 ENCOUNTER — Ambulatory Visit (INDEPENDENT_AMBULATORY_CARE_PROVIDER_SITE_OTHER): Payer: Medicare HMO | Admitting: Ophthalmology

## 2017-01-25 DIAGNOSIS — I10 Essential (primary) hypertension: Secondary | ICD-10-CM

## 2017-01-25 DIAGNOSIS — H35033 Hypertensive retinopathy, bilateral: Secondary | ICD-10-CM

## 2017-01-25 DIAGNOSIS — H353231 Exudative age-related macular degeneration, bilateral, with active choroidal neovascularization: Secondary | ICD-10-CM | POA: Diagnosis not present

## 2017-01-25 DIAGNOSIS — H43813 Vitreous degeneration, bilateral: Secondary | ICD-10-CM

## 2017-01-30 ENCOUNTER — Telehealth: Payer: Self-pay | Admitting: Family Medicine

## 2017-01-30 DIAGNOSIS — Z1211 Encounter for screening for malignant neoplasm of colon: Secondary | ICD-10-CM

## 2017-01-30 DIAGNOSIS — H259 Unspecified age-related cataract: Secondary | ICD-10-CM

## 2017-01-30 NOTE — Telephone Encounter (Signed)
pts daughter calling about referrals to gastro for colonoscopy and eye doctor for cataract surgery.

## 2017-01-31 NOTE — Telephone Encounter (Signed)
Referrals placed for [facility] - Has appt with Dr. Benson Norway 02/01/17 and appt with Dr. Herbert Deaner 03/01/17 for cataract removal.

## 2017-02-01 ENCOUNTER — Other Ambulatory Visit: Payer: Self-pay | Admitting: Gastroenterology

## 2017-02-01 DIAGNOSIS — I340 Nonrheumatic mitral (valve) insufficiency: Secondary | ICD-10-CM | POA: Diagnosis not present

## 2017-02-01 DIAGNOSIS — I482 Chronic atrial fibrillation: Secondary | ICD-10-CM | POA: Diagnosis not present

## 2017-02-01 DIAGNOSIS — Z86010 Personal history of colonic polyps: Secondary | ICD-10-CM | POA: Diagnosis not present

## 2017-02-07 ENCOUNTER — Ambulatory Visit (INDEPENDENT_AMBULATORY_CARE_PROVIDER_SITE_OTHER): Payer: Medicare HMO | Admitting: Family Medicine

## 2017-02-07 DIAGNOSIS — D519 Vitamin B12 deficiency anemia, unspecified: Secondary | ICD-10-CM

## 2017-02-07 DIAGNOSIS — E538 Deficiency of other specified B group vitamins: Secondary | ICD-10-CM

## 2017-02-07 NOTE — Progress Notes (Signed)
PATIENT CAME IN TODAY FOR B-12 INJECTION. INJECTION GIVEN IN R-ARM. PATIENT TOLERATED WELL.

## 2017-02-20 NOTE — Progress Notes (Signed)
Cardiology Office Note   Date:  02/22/2017   ID:  Austin Luppino., DOB 31-Aug-1931, MRN [id number]  PCP:  [name], MD  Cardiologist:   Minus Breeding, MD   Chief Complaint  Patient presents with  . Atrial Fibrillation      History of Present Illness: Austin Gray. is a 83 y.o. male who presents for evaluation of atrial fib and acute on chronic systolic and diastolic HF.  He is doing well.  The patient denies any new symptoms such as chest discomfort, neck or arm discomfort. There has been no new shortness of breath, PND or orthopnea. There have been no reported palpitations, presyncope or syncope.  He gets around very slowly because of back pain.  He does not notice his fibrillation.     Past Medical History:  Diagnosis Date  . Arthritis    "little bit; left knee"  . Basal cell carcinoma   . CHF (congestive heart failure), NYHA class II, chronic, diastolic ([place])   . Colon polyps    last colonoscopy 2015 (tubular adenoma)  . Diverticulosis   . Gastritis   . H/O: GI bleed    Recurrent  . Hemorrhoids   . History of BPH   . Hypertension   . Pernicious anemia   . PVC (premature ventricular contraction)   . Rectal bleeding 02/15/12  . Visual impairment   . Vitamin D deficiency     Past Surgical History:  Procedure Laterality Date  . [place]; ~ 2002   left; right     Current Outpatient Prescriptions  Medication Sig Dispense Refill  . albuterol (PROVENTIL HFA;VENTOLIN HFA) 108 (90 Base) MCG/ACT inhaler Inhale 2 puffs into the lungs every 6 (six) hours as needed for wheezing or shortness of breath. 1 Inhaler 0  . amLODipine (NORVASC) 10 MG tablet Take 1 tablet (10 mg total) by mouth daily. 90 tablet 3  . benazepril (LOTENSIN) 40 MG tablet Take 1 tablet (40 mg total) by mouth daily. 90 tablet 3  . cloNIDine (CATAPRES) 0.1 MG tablet TAKE 1 TABLET AT BEDTIME 90 tablet 3  . doxazosin (CARDURA) 4 MG tablet TAKE 1 TABLET EVERY DAY 90  tablet 3  . ferrous sulfate 325 (65 FE) MG tablet Take 325 mg by mouth every other day.     . furosemide (LASIX) 40 MG tablet Taking 80mg  qam & 40mg  qpm 270 tablet 3  . metoprolol succinate (TOPROL-XL) 50 MG 24 hr tablet Take 2 tablets (100 mg total) by mouth daily. Take with or immediately following a meal. 180 tablet 3  . Multiple Vitamin (MULTIVITAMIN WITH MINERALS) TABS Take 1 tablet by mouth daily.    . multivitamin-lutein (OCUVITE-LUTEIN) CAPS Take 1 capsule by mouth daily.    . potassium chloride (KLOR-CON 10) 10 MEQ tablet Take 1 tablet (10 mEq total) by mouth daily. 90 tablet 3  . XARELTO 20 MG TABS tablet TAKE 1 TABLET (20 MG TOTAL) BY MOUTH DAILY WITH SUPPER. 90 tablet 3   Current Facility-Administered Medications  Medication Dose Route Frequency Provider Last Rate Last Dose  . cyanocobalamin ((VITAMIN B-12)) injection 1,000 mcg  1,000 mcg Subcutaneous Q30 days [name], MD   1,000 mcg at 11/25/16 1225  . cyanocobalamin ((VITAMIN B-12)) injection 1,000 mcg  1,000 mcg Intramuscular Q30 days [name], MD   1,000 mcg at 02/07/17 9381    Allergies:   Patient has no known allergies.    ROS:  Please see the history of present illness.   Otherwise, review of systems are positive for back pain.   All other systems are reviewed and negative.    PHYSICAL EXAM: VS:  BP 120/70   Pulse 89   Ht 5\' 8"  (1.727 m)   Wt 254 lb 3.2 oz (115.3 kg)   BMI 38.65 kg/m  , BMI Body mass index is 38.65 kg/m.  GENERAL:  Well appearing NECK:  No jugular venous distention, waveform within normal limits, carotid upstroke brisk and symmetric, no bruits, no thyromegaly LUNGS:  Clear to auscultation bilaterall CHEST:  Unremarkable HEART:  PMI not displaced or sustained,S1 and S2 within normal limits, no S3, no clicks, no rubs, 3/6 apical systolic murmur, no diastolic murmurs ABD:  Flat, positive bowel sounds normal in frequency in pitch, no bruits, no rebound, no guarding, no midline  pulsatile mass, no hepatomegaly, no splenomegaly EXT:  2 plus pulses throughout, mild/moderate edema, no cyanosis no clubbing   EKG:  EKG is  ordered today. Atrial fib with RBBB, ectopy, no change from previous.  Rate 89  Recent Labs: 06/28/2016: ALT 30 11/25/2016: Brain Natriuretic Peptide 172.2; BUN 10; Creat 0.92; Hemoglobin 12.9; Platelets 123; Potassium 3.7; Sodium 140    Lipid Panel    Component Value Date/Time   CHOL 176 04/08/2016 0823   TRIG 57 04/08/2016 0823   HDL 64 04/08/2016 0823   CHOLHDL 2.8 04/08/2016 0823   VLDL 11 04/08/2016 0823   LDLCALC 101 04/08/2016 0823      Wt Readings from Last 3 Encounters:  02/21/17 254 lb 3.2 oz (115.3 kg)  11/29/16 256 lb (116.1 kg)  11/25/16 258 lb (117 kg)      Other studies Reviewed: Additional studies/ records that were reviewed today include:  Labs Review of the above records demonstrates:     ASSESSMENT AND PLAN:  ATRIAL FIB:    CHA2DS2-VASc Score and unadjusted Ischemic Stroke Rate (% per year) is equal to 3.2 % stroke rate/year from a score of 3.   No change in therapy is indicated.    MR:  I will follow this clinically.  No plans for echo at this time.   EDEMA:  This is stable.  No change in therapy.   OBESITY:  The patient understands the need to lose weight with diet and exercise. We have discussed specific strategies for this.  HTN:   The blood pressure is at target. No change in medications is indicated. We will continue with therapeutic lifestyle changes (TLC).  PREOP:  The patient would be at acceptable risk for colonoscopy and can hold Xarelto for 2 days prior.  He would also be at acceptable risk for eye surgery.   Current medicines are reviewed at length with the patient today.  The patient does not have concerns regarding medicines.  The following changes have been made:  None   Labs/ tests ordered today include:  None  Orders Placed This Encounter  Procedures  . EKG 12-Lead      Disposition:   FU with me in 12 months.    Signed, Minus Breeding, MD  02/22/2017 8:43 AM    [facility]

## 2017-02-21 ENCOUNTER — Ambulatory Visit (INDEPENDENT_AMBULATORY_CARE_PROVIDER_SITE_OTHER): Payer: Medicare HMO | Admitting: Cardiology

## 2017-02-21 VITALS — BP 120/70 | HR 89 | Ht 68.0 in | Wt 254.2 lb

## 2017-02-21 DIAGNOSIS — I481 Persistent atrial fibrillation: Secondary | ICD-10-CM | POA: Diagnosis not present

## 2017-02-21 DIAGNOSIS — I10 Essential (primary) hypertension: Secondary | ICD-10-CM | POA: Diagnosis not present

## 2017-02-21 DIAGNOSIS — I4819 Other persistent atrial fibrillation: Secondary | ICD-10-CM

## 2017-02-21 DIAGNOSIS — I340 Nonrheumatic mitral (valve) insufficiency: Secondary | ICD-10-CM | POA: Diagnosis not present

## 2017-02-21 NOTE — Patient Instructions (Signed)
Medication Instructions:  Continue current medications  If you need a refill on your cardiac medications before your next appointment, please call your pharmacy.  Labwork: None Ordered   Testing/Procedures: None Ordered  Follow-Up: Your physician wants you to follow-up in: 1 Year. You should receive a reminder letter in the mail two months in advance. If you do not receive a letter, please call our office [phone].    Thank you for choosing [facility]!!      

## 2017-02-22 ENCOUNTER — Encounter: Payer: Self-pay | Admitting: Cardiology

## 2017-02-22 DIAGNOSIS — I340 Nonrheumatic mitral (valve) insufficiency: Secondary | ICD-10-CM | POA: Insufficient documentation

## 2017-02-28 ENCOUNTER — Telehealth: Payer: Self-pay | Admitting: *Deleted

## 2017-02-28 NOTE — Telephone Encounter (Signed)
Dr Percival Spanish office note faxed to Dr Carol Ada and [facility] via Epic

## 2017-03-01 DIAGNOSIS — H25013 Cortical age-related cataract, bilateral: Secondary | ICD-10-CM | POA: Diagnosis not present

## 2017-03-01 DIAGNOSIS — H353231 Exudative age-related macular degeneration, bilateral, with active choroidal neovascularization: Secondary | ICD-10-CM | POA: Diagnosis not present

## 2017-03-01 DIAGNOSIS — H2512 Age-related nuclear cataract, left eye: Secondary | ICD-10-CM | POA: Diagnosis not present

## 2017-03-01 DIAGNOSIS — H25012 Cortical age-related cataract, left eye: Secondary | ICD-10-CM | POA: Diagnosis not present

## 2017-03-01 DIAGNOSIS — H35033 Hypertensive retinopathy, bilateral: Secondary | ICD-10-CM | POA: Diagnosis not present

## 2017-03-01 DIAGNOSIS — H2513 Age-related nuclear cataract, bilateral: Secondary | ICD-10-CM | POA: Diagnosis not present

## 2017-03-08 ENCOUNTER — Encounter (HOSPITAL_COMMUNITY): Payer: Self-pay | Admitting: *Deleted

## 2017-03-17 ENCOUNTER — Ambulatory Visit (HOSPITAL_COMMUNITY)
Admission: RE | Admit: 2017-03-17 | Discharge: 2017-03-17 | Disposition: A | Discharge status: Home / Self Care | Payer: Medicare HMO | Source: Ambulatory Visit | Attending: Gastroenterology | Admitting: Gastroenterology

## 2017-03-17 ENCOUNTER — Encounter (HOSPITAL_COMMUNITY)
Admission: RE | Disposition: A | Discharge status: Home / Self Care | Payer: Self-pay | Source: Ambulatory Visit | Attending: Gastroenterology

## 2017-03-17 ENCOUNTER — Ambulatory Visit (HOSPITAL_COMMUNITY): Payer: Medicare HMO | Admitting: Anesthesiology

## 2017-03-17 ENCOUNTER — Encounter (HOSPITAL_COMMUNITY): Payer: Self-pay | Admitting: *Deleted

## 2017-03-17 DIAGNOSIS — D120 Benign neoplasm of cecum: Secondary | ICD-10-CM | POA: Diagnosis not present

## 2017-03-17 DIAGNOSIS — M1712 Unilateral primary osteoarthritis, left knee: Secondary | ICD-10-CM | POA: Diagnosis not present

## 2017-03-17 DIAGNOSIS — Z1211 Encounter for screening for malignant neoplasm of colon: Secondary | ICD-10-CM | POA: Diagnosis not present

## 2017-03-17 DIAGNOSIS — D123 Benign neoplasm of transverse colon: Secondary | ICD-10-CM | POA: Insufficient documentation

## 2017-03-17 DIAGNOSIS — D122 Benign neoplasm of ascending colon: Secondary | ICD-10-CM | POA: Diagnosis not present

## 2017-03-17 DIAGNOSIS — N400 Enlarged prostate without lower urinary tract symptoms: Secondary | ICD-10-CM | POA: Insufficient documentation

## 2017-03-17 DIAGNOSIS — Z720 Tobacco use: Secondary | ICD-10-CM | POA: Diagnosis not present

## 2017-03-17 DIAGNOSIS — I493 Ventricular premature depolarization: Secondary | ICD-10-CM | POA: Diagnosis not present

## 2017-03-17 DIAGNOSIS — Z8719 Personal history of other diseases of the digestive system: Secondary | ICD-10-CM | POA: Diagnosis not present

## 2017-03-17 DIAGNOSIS — K219 Gastro-esophageal reflux disease without esophagitis: Secondary | ICD-10-CM | POA: Diagnosis not present

## 2017-03-17 DIAGNOSIS — Z87442 Personal history of urinary calculi: Secondary | ICD-10-CM | POA: Insufficient documentation

## 2017-03-17 DIAGNOSIS — Z86010 Personal history of colonic polyps: Secondary | ICD-10-CM | POA: Insufficient documentation

## 2017-03-17 DIAGNOSIS — Z6838 Body mass index (BMI) 38.0-38.9, adult: Secondary | ICD-10-CM | POA: Insufficient documentation

## 2017-03-17 DIAGNOSIS — I5032 Chronic diastolic (congestive) heart failure: Secondary | ICD-10-CM | POA: Insufficient documentation

## 2017-03-17 DIAGNOSIS — E559 Vitamin D deficiency, unspecified: Secondary | ICD-10-CM | POA: Insufficient documentation

## 2017-03-17 DIAGNOSIS — F419 Anxiety disorder, unspecified: Secondary | ICD-10-CM | POA: Insufficient documentation

## 2017-03-17 DIAGNOSIS — I4891 Unspecified atrial fibrillation: Secondary | ICD-10-CM | POA: Insufficient documentation

## 2017-03-17 DIAGNOSIS — K5730 Diverticulosis of large intestine without perforation or abscess without bleeding: Secondary | ICD-10-CM | POA: Insufficient documentation

## 2017-03-17 DIAGNOSIS — F329 Major depressive disorder, single episode, unspecified: Secondary | ICD-10-CM | POA: Insufficient documentation

## 2017-03-17 DIAGNOSIS — I959 Hypotension, unspecified: Secondary | ICD-10-CM | POA: Diagnosis not present

## 2017-03-17 DIAGNOSIS — Z85828 Personal history of other malignant neoplasm of skin: Secondary | ICD-10-CM | POA: Insufficient documentation

## 2017-03-17 DIAGNOSIS — I110 Hypertensive heart disease with heart failure: Secondary | ICD-10-CM | POA: Diagnosis not present

## 2017-03-17 HISTORY — DX: Anxiety disorder, unspecified: F41.9

## 2017-03-17 HISTORY — DX: Personal history of other medical treatment: Z92.89

## 2017-03-17 HISTORY — DX: Personal history of urinary calculi: Z87.442

## 2017-03-17 HISTORY — DX: Unspecified cataract: H26.9

## 2017-03-17 HISTORY — PX: COLONOSCOPY WITH PROPOFOL: SHX5780

## 2017-03-17 HISTORY — DX: Major depressive disorder, single episode, unspecified: F32.9

## 2017-03-17 HISTORY — DX: Depression, unspecified: F32.A

## 2017-03-17 HISTORY — DX: Gastro-esophageal reflux disease without esophagitis: K21.9

## 2017-03-17 SURGERY — COLONOSCOPY WITH PROPOFOL
Anesthesia: Monitor Anesthesia Care

## 2017-03-17 MED ORDER — EPHEDRINE SULFATE-NACL 50-0.9 MG/10ML-% IV SOSY
PREFILLED_SYRINGE | INTRAVENOUS | Status: DC | PRN
  Administered 2017-03-17: 5 mg via INTRAVENOUS
  Administered 2017-03-17: 10 mg via INTRAVENOUS

## 2017-03-17 MED ORDER — PROPOFOL 10 MG/ML IV BOLUS
INTRAVENOUS | Status: DC | PRN
  Administered 2017-03-17: 40 mg via INTRAVENOUS

## 2017-03-17 MED ORDER — PHENYLEPHRINE 40 MCG/ML (10ML) SYRINGE FOR IV PUSH (FOR BLOOD PRESSURE SUPPORT)
PREFILLED_SYRINGE | INTRAVENOUS | Status: AC
  Filled 2017-03-17: qty 10

## 2017-03-17 MED ORDER — LIDOCAINE 2% (20 MG/ML) 5 ML SYRINGE
INTRAMUSCULAR | Status: AC
  Filled 2017-03-17: qty 5

## 2017-03-17 MED ORDER — SODIUM CHLORIDE 0.9 % IJ SOLN
INTRAMUSCULAR | Status: AC
  Filled 2017-03-17: qty 40

## 2017-03-17 MED ORDER — PROPOFOL 10 MG/ML IV BOLUS
INTRAVENOUS | Status: AC
  Filled 2017-03-17: qty 20

## 2017-03-17 MED ORDER — EPHEDRINE 5 MG/ML INJ
INTRAVENOUS | Status: AC
  Filled 2017-03-17: qty 10

## 2017-03-17 MED ORDER — LIDOCAINE 2% (20 MG/ML) 5 ML SYRINGE
INTRAMUSCULAR | Status: DC | PRN
  Administered 2017-03-17: 100 mg via INTRAVENOUS

## 2017-03-17 MED ORDER — PROPOFOL 10 MG/ML IV BOLUS
INTRAVENOUS | Status: AC
  Filled 2017-03-17: qty 40

## 2017-03-17 MED ORDER — SODIUM CHLORIDE 0.9 % IV SOLN: INTRAVENOUS | Status: DC

## 2017-03-17 MED ORDER — LACTATED RINGERS IV SOLN
INTRAVENOUS | Status: DC
  Administered 2017-03-17: 10:00:00 via INTRAVENOUS

## 2017-03-17 MED ORDER — PHENYLEPHRINE 40 MCG/ML (10ML) SYRINGE FOR IV PUSH (FOR BLOOD PRESSURE SUPPORT)
PREFILLED_SYRINGE | INTRAVENOUS | Status: DC | PRN
  Administered 2017-03-17 (×4): 80 ug via INTRAVENOUS

## 2017-03-17 MED ORDER — PROPOFOL 500 MG/50ML IV EMUL
INTRAVENOUS | Status: DC | PRN
  Administered 2017-03-17: 100 ug/kg/min via INTRAVENOUS

## 2017-03-17 SURGICAL SUPPLY — 21 items

## 2017-03-17 NOTE — Anesthesia Preprocedure Evaluation (Signed)
Anesthesia Evaluation  Patient identified by MRN, date of birth, ID band Patient awake    Reviewed: Allergy & Precautions, NPO status , Patient's Chart, lab work & pertinent test results  History of Anesthesia Complications Negative for: history of anesthetic complications  Airway Mallampati: II  TM Distance: >3 FB Neck ROM: Full    Dental  (+) Chipped, Poor Dentition, Missing   Pulmonary shortness of breath,    breath sounds clear to auscultation       Cardiovascular hypertension, Pt. on medications and Pt. on home beta blockers +CHF  + dysrhythmias Atrial Fibrillation + Valvular Problems/Murmurs MR  Rhythm:Irregular     Neuro/Psych PSYCHIATRIC DISORDERS Anxiety Depression negative neurological ROS     GI/Hepatic Neg liver ROS, GERD  Controlled,  Endo/Other  Morbid obesity  Renal/GU negative Renal ROS     Musculoskeletal  (+) Arthritis ,   Abdominal   Peds  Hematology  (+) anemia ,   Anesthesia Other Findings   Reproductive/Obstetrics                             Anesthesia Physical Anesthesia Plan  ASA: III  Anesthesia Plan: MAC   Post-op Pain Management:    Induction: Intravenous  PONV Risk Score and Plan: 1 and Treatment may vary due to age or medical condition  Airway Management Planned: Nasal Cannula  Additional Equipment:   Intra-op Plan:   Post-operative Plan:   Informed Consent: I have reviewed the patients History and Physical, chart, labs and discussed the procedure including the risks, benefits and alternatives for the proposed anesthesia with the patient or authorized representative who has indicated his/her understanding and acceptance.   Dental advisory given  Plan Discussed with: CRNA and Surgeon  Anesthesia Plan Comments:         Anesthesia Quick Evaluation

## 2017-03-17 NOTE — Op Note (Signed)
[facility] Patient Name: Austin Gray Procedure Date: 03/17/2017 MRN: [id number] Attending MD: Carol Ada , MD Date of Birth: [birth year] CSN: [id number] Age: 83 Admit Type: Outpatient Procedure:                Colonoscopy Indications:              High risk colon cancer surveillance: Personal                            history of colonic polyps Providers:                Carol Ada, MD, Angus Seller, Cletis [place],                            Technician Referring MD:              Medicines:                Propofol per Anesthesia Complications:            No immediate complications. Estimated Blood Loss:     Estimated blood loss was minimal. Procedure:                Pre-Anesthesia Assessment:                           - Prior to the procedure, a History and Physical                            was performed, and patient medications and                            allergies were reviewed. The patient's tolerance of                            previous anesthesia was also reviewed. The risks                            and benefits of the procedure and the sedation                            options and risks were discussed with the patient.                            All questions were answered, and informed consent                            was obtained. Prior Anticoagulants: The patient has                            taken no previous anticoagulant or antiplatelet                            agents. ASA Grade Assessment: III - A patient with                            severe systemic  disease. After reviewing the risks                            and benefits, the patient was deemed in                            satisfactory condition to undergo the procedure.                           - Sedation was administered by an anesthesia                            professional. Deep sedation was attained.                           After obtaining informed consent, the  colonoscope                            was passed under direct vision. Throughout the                            procedure, the patient's blood pressure, pulse, and                            oxygen saturations were monitored continuously. The                            EC-[id number] ([id number]) scope was introduced through                            the anus and advanced to the the cecum, identified                            by appendiceal orifice and ileocecal valve. The                            colonoscopy was extremely difficult. The patient                            tolerated the procedure well. The quality of the                            bowel preparation was good. The ileocecal valve,                            appendiceal orifice, and rectum were photographed. Scope In: 10:19:09 AM Scope Out: 11:42:42 AM Scope Withdrawal Time: 0 hours 58 minutes 13 seconds  Total Procedure Duration: 1 hour 23 minutes 33 seconds  Findings:      A 30 mm polyp was found in the cecum. The polyp was sessile. The polyp       was removed with a saline injection-lift technique using a hot snare.       The polyp was removed with a piecemeal technique using a hot snare.       Resection  and retrieval were complete.      Seven sessile polyps were found in the transverse colon and ascending       colon. The polyps were 3 to 5 mm in size. These polyps were removed with       a cold snare. Resection and retrieval were complete.      Scattered small and large-mouthed diverticula were found in the sigmoid       colon and descending colon.      Significant looping occurred with the colonoscopy. With external       pressure and full insertion of the colonoscope to the hilt the cecum was       intubated. A very large 3 cm cecal polyp was identified. It was       difficult to visualize initially as it was inverted and hidden between       two very large cecal folds. Submucosal injection was used to lift the        lesion and in a piecemeal fashion the polyp was resected. A flexible       snare as well as the stiff snare, medium and small, were used to remove       the lesion. Suspicious edges were ablated with cautery using the tip of       the snare. Impression:               - One 30 mm polyp in the cecum, removed using                            injection-lift and a hot snare and removed                            piecemeal using a hot snare. Resected and retrieved.                           - Seven 3 to 5 mm polyps in the transverse colon                            and in the ascending colon, removed with a cold                            snare. Resected and retrieved.                           - Diverticulosis in the sigmoid colon and in the                            descending colon. Moderate Sedation:      N/A- Per Anesthesia Care Recommendation:           - Patient has a contact number available for                            emergencies. The signs and symptoms of potential                            delayed complications were discussed with the  patient. Return to normal activities tomorrow.                            Written discharge instructions were provided to the                            patient.                           - Resume previous diet.                           - Continue present medications.                           - Await pathology results.                           - Repeat colonoscopy in 1 year for surveillance                            after piecemeal polypectomy. Procedure Code(s):        --- Professional ---                           [phone], Colonoscopy, flexible; with removal of                            tumor(s), polyp(s), or other lesion(s) by snare                            technique                           [id number], Colonoscopy, flexible; with directed                            submucosal injection(s), any substance Diagnosis  Code(s):        --- Professional ---                           D12.0, Benign neoplasm of cecum                           Z86.010, Personal history of colonic polyps                           D12.3, Benign neoplasm of transverse colon (hepatic                            flexure or splenic flexure)                           D12.2, Benign neoplasm of ascending colon                           K57.30, Diverticulosis of large intestine without  perforation or abscess without bleeding CPT copyright 2016 American Medical Association. All rights reserved. The codes documented in this report are preliminary and upon coder review may  be revised to meet current compliance requirements. Carol Ada, MD Carol Ada, MD 03/17/2017 11:50:01 AM This report has been signed electronically. Number of Addenda: 0

## 2017-03-17 NOTE — Transfer of Care (Signed)
Immediate Anesthesia Transfer of Care Note  Patient: Austin Gray.  Procedure(s) Performed: Procedure(s): COLONOSCOPY WITH PROPOFOL (N/A)  Patient Location: PACU  Anesthesia Type:MAC  Level of Consciousness: awake, alert , oriented and sedated  Airway & Oxygen Therapy: Patient Spontanous Breathing and Patient connected to nasal cannula oxygen  Post-op Assessment: Report given to RN and Post -op Vital signs reviewed and stable  Post vital signs: Reviewed and stable  Last Vitals:  Vitals:   03/17/17 0918  BP: 135/73  Resp: (!) 24  Temp: 36.7 C  SpO2: 96%    Last Pain:  Vitals:   03/17/17 0918  TempSrc: Oral         Complications: No apparent anesthesia complications

## 2017-03-17 NOTE — Discharge Instructions (Signed)

## 2017-03-17 NOTE — H&P (Signed)
[place] HPI: This is an 83 year old male with a PMH of colonic adenomas on 01/28/2014. He is here for a follow up colonoscopy.  Past Medical History:  Diagnosis Date  . Anxiety   . Arthritis    "little bit; left knee"  . Basal cell carcinoma   . Cataracts, bilateral    to have left eye cataract surgery on 03/21/17   . CHF (congestive heart failure), NYHA class II, chronic, diastolic ([place])   . Colon polyps    last colonoscopy 2015 (tubular adenoma)  . Depression   . Diverticulosis   . Dyspnea    with exertion   . Edema    lower extremities   . Gastritis   . GERD (gastroesophageal reflux disease)   . H/O: GI bleed    Recurrent  . Heart murmur   . Hemorrhoids   . History of blood transfusion   . History of BPH   . History of kidney stones    hx of x 1   . Hypertension   . Pernicious anemia   . PVC (premature ventricular contraction)   . Rectal bleeding 02/15/12  . Visual impairment   . Vitamin D deficiency     Past Surgical History:  Procedure Laterality Date  . [place]; ~ 2002   left; right    Family History  Problem Relation Age of Onset  . Stroke Father 110  . Cancer Mother     Social History:  reports that he has never smoked. His smokeless tobacco use includes Snuff. He reports that he does not drink alcohol or use drugs.  Allergies: No Known Allergies  Medications:  Scheduled:  Continuous: . sodium chloride      No results found for this or any previous visit (from the past 24 hour(s)).   No results found.  ROS:  As stated above in the HPI otherwise negative.  Blood pressure 135/73, temperature 98.1 F (36.7 C), temperature source Oral, resp. rate (!) 24, height 5\' 8"  (1.727 m), weight 116.1 kg (256 lb), SpO2 96 %.    PE: Gen: NAD, Alert and Oriented HEENT:  [place]/AT, EOMI Neck: Supple, no LAD Lungs: CTA Bilaterally CV: RRR without M/G/R ABM: Soft, NTND, +BS Ext: No C/C/E  Assessment/Plan: 1) Personal  history of polyps - colonoscopy.  Jamilya Sarrazin D 03/17/2017, 9:28 AM

## 2017-03-21 ENCOUNTER — Encounter (HOSPITAL_COMMUNITY): Payer: Self-pay | Admitting: Gastroenterology

## 2017-03-21 NOTE — Anesthesia Postprocedure Evaluation (Signed)
Anesthesia Post Note  Patient: Austin Gray.  Procedure(s) Performed: Procedure(s) (LRB): COLONOSCOPY WITH PROPOFOL (N/A)     Patient location during evaluation: Endoscopy Anesthesia Type: MAC Level of consciousness: awake and alert Pain management: pain level controlled Vital Signs Assessment: post-procedure vital signs reviewed and stable Respiratory status: spontaneous breathing, nonlabored ventilation, respiratory function stable and patient connected to nasal cannula oxygen Cardiovascular status: stable and blood pressure returned to baseline Postop Assessment: no signs of nausea or vomiting Anesthetic complications: no    Last Vitals:  Vitals:   03/17/17 1151 03/17/17 1200  BP: 100/60 (!) 115/47  Pulse: 75 71  Resp: (!) 22 19  Temp:    SpO2: 95% 98%    Last Pain:  Vitals:   03/17/17 0918  TempSrc: Oral                 Austin Gray

## 2017-03-28 DIAGNOSIS — H2512 Age-related nuclear cataract, left eye: Secondary | ICD-10-CM | POA: Diagnosis not present

## 2017-03-28 DIAGNOSIS — H25812 Combined forms of age-related cataract, left eye: Secondary | ICD-10-CM | POA: Diagnosis not present

## 2017-03-29 ENCOUNTER — Other Ambulatory Visit: Payer: Self-pay | Admitting: Family Medicine

## 2017-03-31 ENCOUNTER — Ambulatory Visit (INDEPENDENT_AMBULATORY_CARE_PROVIDER_SITE_OTHER): Payer: Medicare HMO | Admitting: Family Medicine

## 2017-03-31 DIAGNOSIS — Z23 Encounter for immunization: Secondary | ICD-10-CM

## 2017-03-31 DIAGNOSIS — E538 Deficiency of other specified B group vitamins: Secondary | ICD-10-CM

## 2017-03-31 DIAGNOSIS — D519 Vitamin B12 deficiency anemia, unspecified: Secondary | ICD-10-CM | POA: Diagnosis not present

## 2017-04-03 ENCOUNTER — Ambulatory Visit: Payer: Medicare HMO | Admitting: Cardiology

## 2017-04-19 IMAGING — DX DG CHEST 2V
2 series · 2 of 2 positions shown · non-contrast
Comparison: None.

CLINICAL DATA: Dyspnea

EXAM:
CHEST  2 VIEW

[chest pa]
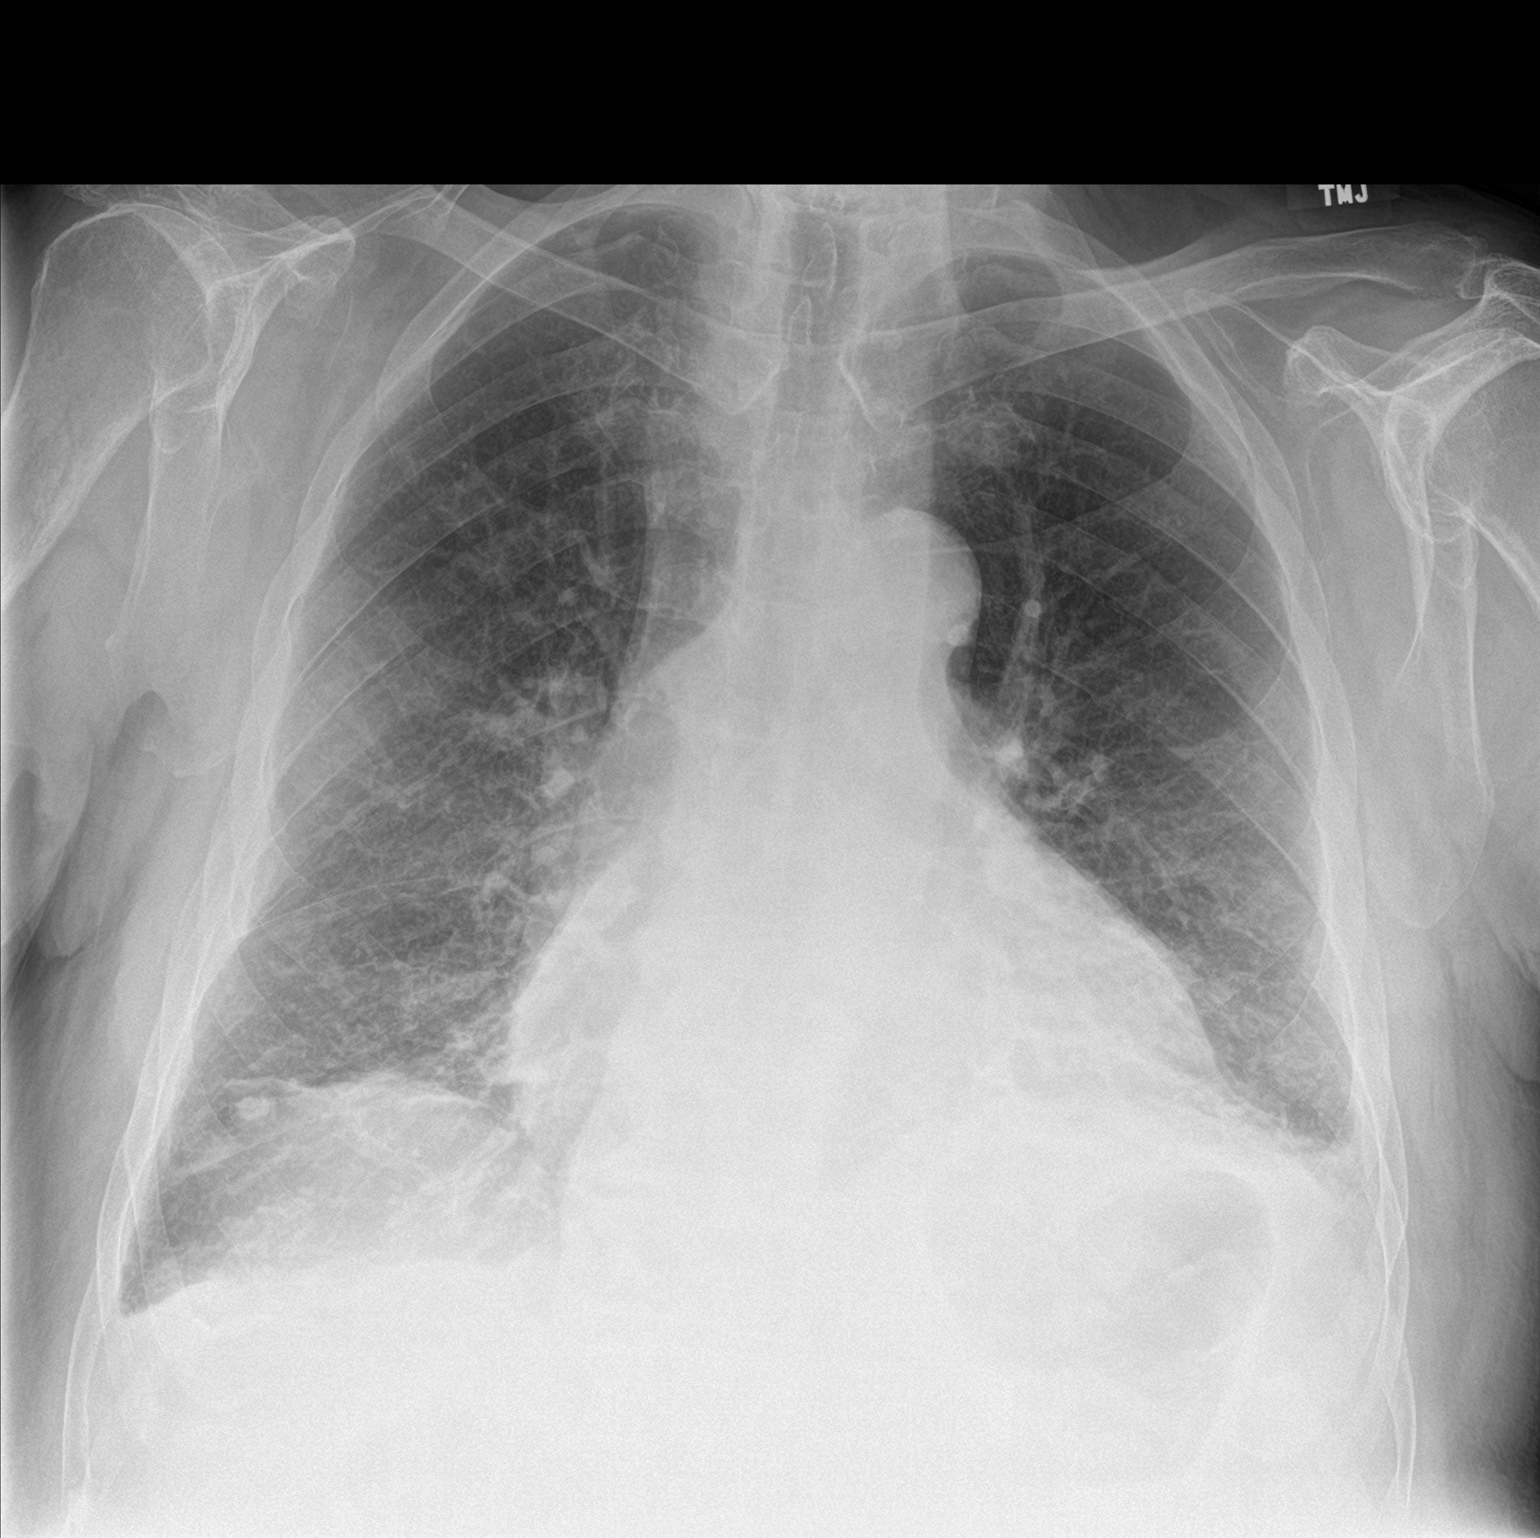

[chest lat]
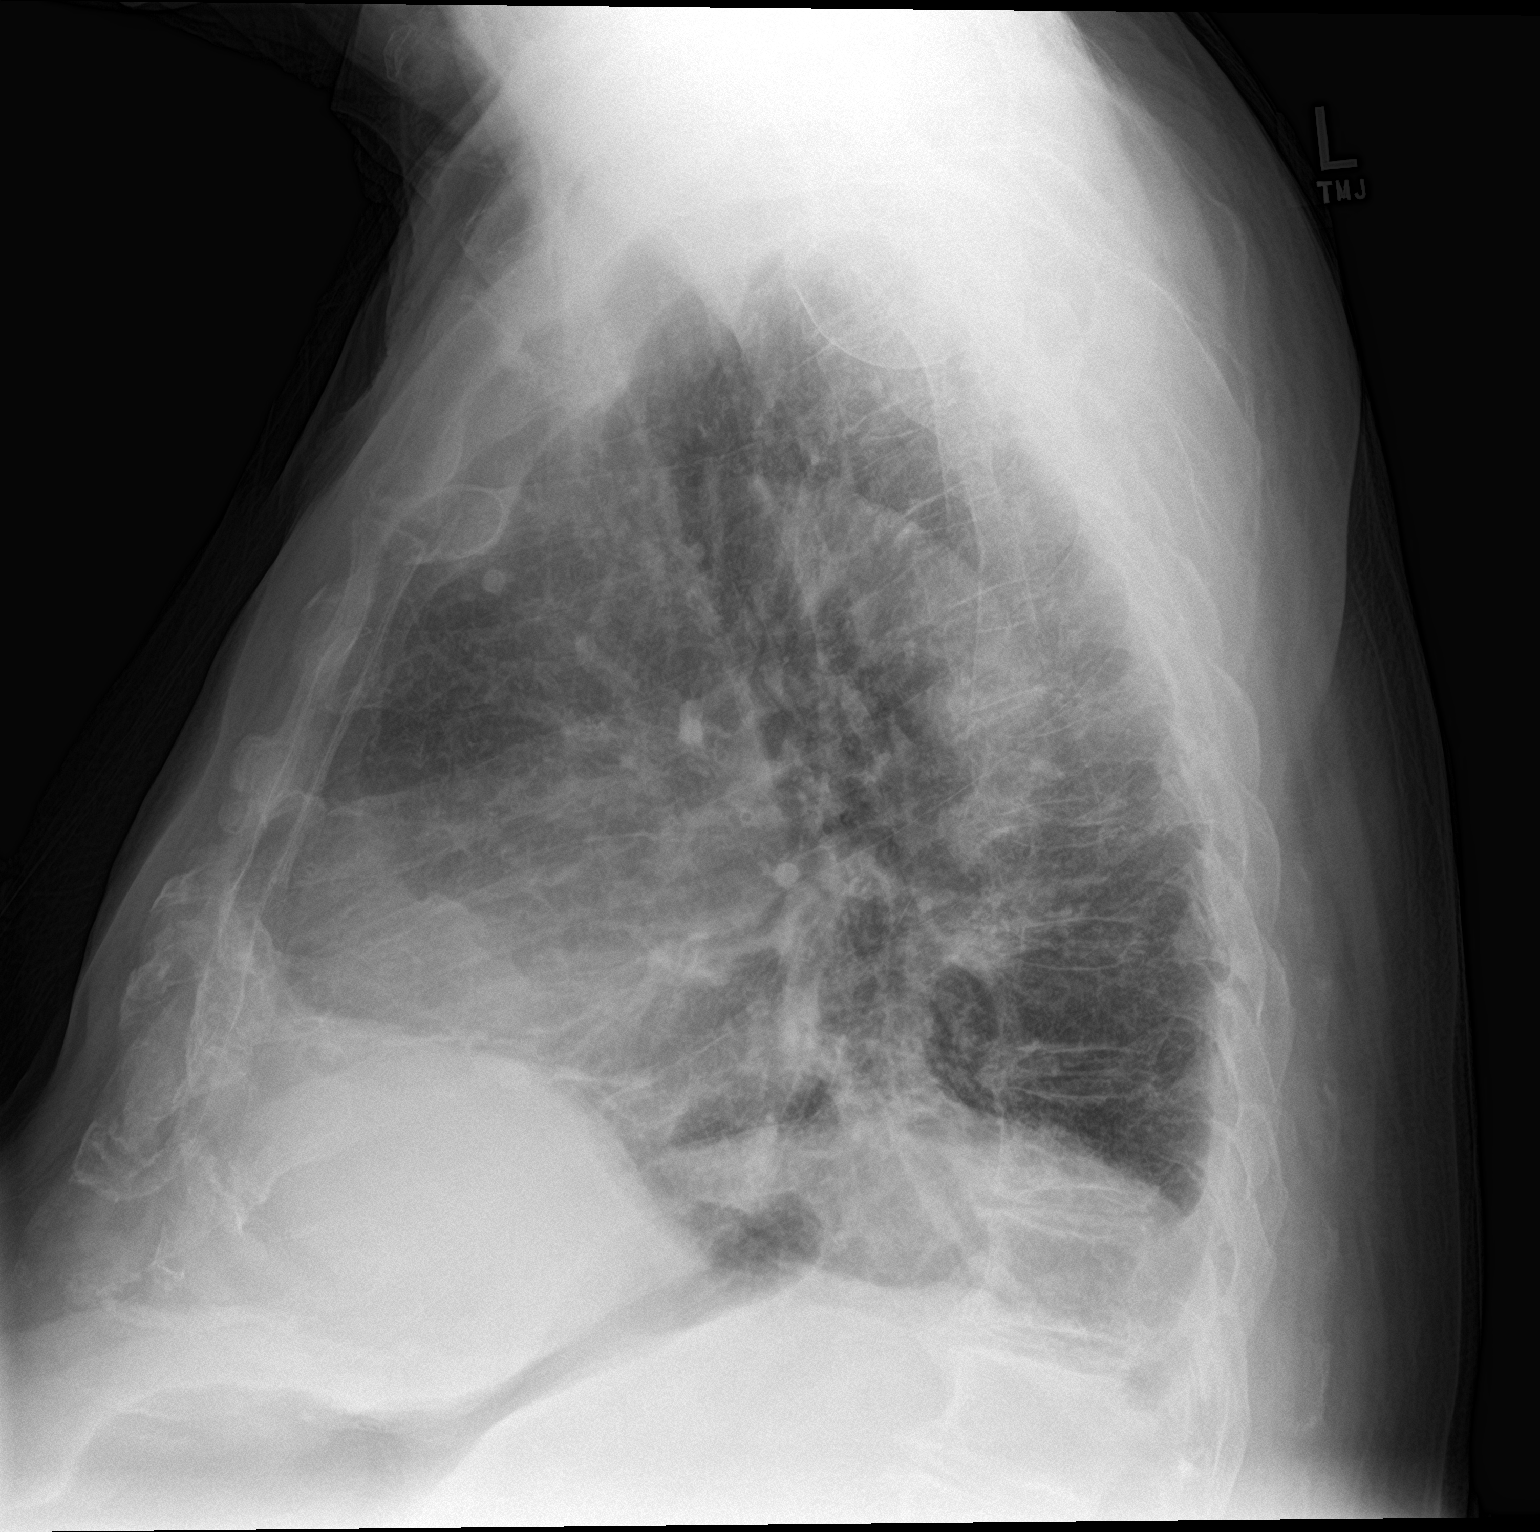

[2 of 2 positions shown; findings below may reference images not displayed]

FINDINGS: Cardiac shadow is enlarged. The lungs are well aerated bilaterally
with linear densities in the bases likely relating to a combination
of scarring and atelectasis. Calcified granulomas are noted. No bony
abnormality is seen.
IMPRESSION: Scarring versus atelectasis in the bases bilaterally.

Changes consistent with prior granulomatous disease.

## 2017-04-26 DIAGNOSIS — H2511 Age-related nuclear cataract, right eye: Secondary | ICD-10-CM | POA: Diagnosis not present

## 2017-04-26 DIAGNOSIS — H25011 Cortical age-related cataract, right eye: Secondary | ICD-10-CM | POA: Diagnosis not present

## 2017-04-27 ENCOUNTER — Telehealth: Payer: Self-pay | Admitting: Family Medicine

## 2017-04-27 DIAGNOSIS — H269 Unspecified cataract: Secondary | ICD-10-CM

## 2017-04-27 NOTE — Telephone Encounter (Signed)
Pt calling to get a new referral for eye doctor, he did not leave a preference, does he need an appointment for the referral or no?

## 2017-05-01 NOTE — Telephone Encounter (Signed)
Referral has been placed. 

## 2017-05-02 ENCOUNTER — Ambulatory Visit (INDEPENDENT_AMBULATORY_CARE_PROVIDER_SITE_OTHER): Payer: Medicare HMO | Admitting: Family Medicine

## 2017-05-02 DIAGNOSIS — D519 Vitamin B12 deficiency anemia, unspecified: Secondary | ICD-10-CM | POA: Diagnosis not present

## 2017-05-02 DIAGNOSIS — E538 Deficiency of other specified B group vitamins: Secondary | ICD-10-CM

## 2017-06-05 ENCOUNTER — Ambulatory Visit (INDEPENDENT_AMBULATORY_CARE_PROVIDER_SITE_OTHER): Payer: Medicare HMO

## 2017-06-05 DIAGNOSIS — D519 Vitamin B12 deficiency anemia, unspecified: Secondary | ICD-10-CM

## 2017-06-05 DIAGNOSIS — E538 Deficiency of other specified B group vitamins: Secondary | ICD-10-CM

## 2017-06-05 NOTE — Progress Notes (Signed)
Patient was in office to receive his b12 injection. patient received his injection in his right deltoid.patietn tolerated well

## 2017-06-13 DIAGNOSIS — H2511 Age-related nuclear cataract, right eye: Secondary | ICD-10-CM | POA: Diagnosis not present

## 2017-06-13 DIAGNOSIS — H25811 Combined forms of age-related cataract, right eye: Secondary | ICD-10-CM | POA: Diagnosis not present

## 2017-07-25 ENCOUNTER — Ambulatory Visit (INDEPENDENT_AMBULATORY_CARE_PROVIDER_SITE_OTHER): Payer: Medicare HMO

## 2017-07-25 DIAGNOSIS — D519 Vitamin B12 deficiency anemia, unspecified: Secondary | ICD-10-CM

## 2017-07-25 DIAGNOSIS — E538 Deficiency of other specified B group vitamins: Secondary | ICD-10-CM

## 2017-07-25 NOTE — Progress Notes (Signed)
Patient was in office for b12 injection.Patient received injection in right deltoid as requested. Patient tolerated well

## 2017-08-04 ENCOUNTER — Other Ambulatory Visit: Payer: Self-pay | Admitting: Family Medicine

## 2017-08-07 ENCOUNTER — Other Ambulatory Visit: Payer: Self-pay | Admitting: Family Medicine

## 2017-08-07 DIAGNOSIS — R635 Abnormal weight gain: Secondary | ICD-10-CM

## 2017-08-07 DIAGNOSIS — R0600 Dyspnea, unspecified: Secondary | ICD-10-CM

## 2017-08-07 MED ORDER — FUROSEMIDE 40 MG PO TABS: ORAL_TABLET | ORAL | 1 refills | Status: DC

## 2017-08-30 ENCOUNTER — Other Ambulatory Visit: Payer: Self-pay | Admitting: Family Medicine

## 2017-09-05 ENCOUNTER — Inpatient Hospital Stay (HOSPITAL_COMMUNITY)
Admission: EM | Admit: 2017-09-05 | Discharge: 2017-09-08 | DRG: 309 | Disposition: A | Discharge status: Home / Self Care | Payer: Medicare HMO | Attending: Cardiology | Admitting: Cardiology

## 2017-09-05 ENCOUNTER — Ambulatory Visit (INDEPENDENT_AMBULATORY_CARE_PROVIDER_SITE_OTHER): Payer: Medicare HMO | Admitting: Family Medicine

## 2017-09-05 ENCOUNTER — Emergency Department (HOSPITAL_COMMUNITY): Payer: Medicare HMO

## 2017-09-05 ENCOUNTER — Encounter (HOSPITAL_COMMUNITY): Payer: Self-pay | Admitting: Emergency Medicine

## 2017-09-05 ENCOUNTER — Encounter: Payer: Self-pay | Admitting: Family Medicine

## 2017-09-05 VITALS — BP 120/60 | HR 32 | Temp 97.4°F | Resp 22 | Ht 68.0 in | Wt 270.0 lb

## 2017-09-05 DIAGNOSIS — Z85828 Personal history of other malignant neoplasm of skin: Secondary | ICD-10-CM | POA: Diagnosis not present

## 2017-09-05 DIAGNOSIS — Z7901 Long term (current) use of anticoagulants: Secondary | ICD-10-CM

## 2017-09-05 DIAGNOSIS — R05 Cough: Secondary | ICD-10-CM | POA: Diagnosis not present

## 2017-09-05 DIAGNOSIS — N400 Enlarged prostate without lower urinary tract symptoms: Secondary | ICD-10-CM | POA: Diagnosis present

## 2017-09-05 DIAGNOSIS — F419 Anxiety disorder, unspecified: Secondary | ICD-10-CM | POA: Diagnosis not present

## 2017-09-05 DIAGNOSIS — R0600 Dyspnea, unspecified: Secondary | ICD-10-CM | POA: Diagnosis not present

## 2017-09-05 DIAGNOSIS — I340 Nonrheumatic mitral (valve) insufficiency: Secondary | ICD-10-CM | POA: Diagnosis not present

## 2017-09-05 DIAGNOSIS — I493 Ventricular premature depolarization: Secondary | ICD-10-CM | POA: Diagnosis present

## 2017-09-05 DIAGNOSIS — F329 Major depressive disorder, single episode, unspecified: Secondary | ICD-10-CM | POA: Diagnosis present

## 2017-09-05 DIAGNOSIS — Z87442 Personal history of urinary calculi: Secondary | ICD-10-CM

## 2017-09-05 DIAGNOSIS — K219 Gastro-esophageal reflux disease without esophagitis: Secondary | ICD-10-CM | POA: Diagnosis present

## 2017-09-05 DIAGNOSIS — E119 Type 2 diabetes mellitus without complications: Secondary | ICD-10-CM | POA: Diagnosis present

## 2017-09-05 DIAGNOSIS — Z79899 Other long term (current) drug therapy: Secondary | ICD-10-CM | POA: Diagnosis not present

## 2017-09-05 DIAGNOSIS — R300 Dysuria: Secondary | ICD-10-CM | POA: Diagnosis not present

## 2017-09-05 DIAGNOSIS — R0602 Shortness of breath: Secondary | ICD-10-CM | POA: Diagnosis not present

## 2017-09-05 DIAGNOSIS — I4891 Unspecified atrial fibrillation: Secondary | ICD-10-CM | POA: Diagnosis not present

## 2017-09-05 DIAGNOSIS — J069 Acute upper respiratory infection, unspecified: Secondary | ICD-10-CM | POA: Diagnosis present

## 2017-09-05 DIAGNOSIS — H269 Unspecified cataract: Secondary | ICD-10-CM | POA: Diagnosis present

## 2017-09-05 DIAGNOSIS — Z6838 Body mass index (BMI) 38.0-38.9, adult: Secondary | ICD-10-CM

## 2017-09-05 DIAGNOSIS — E669 Obesity, unspecified: Secondary | ICD-10-CM | POA: Diagnosis present

## 2017-09-05 DIAGNOSIS — I482 Chronic atrial fibrillation, unspecified: Secondary | ICD-10-CM

## 2017-09-05 DIAGNOSIS — I110 Hypertensive heart disease with heart failure: Secondary | ICD-10-CM | POA: Diagnosis not present

## 2017-09-05 DIAGNOSIS — I5032 Chronic diastolic (congestive) heart failure: Secondary | ICD-10-CM | POA: Diagnosis not present

## 2017-09-05 DIAGNOSIS — Z720 Tobacco use: Secondary | ICD-10-CM | POA: Diagnosis not present

## 2017-09-05 DIAGNOSIS — R001 Bradycardia, unspecified: Principal | ICD-10-CM

## 2017-09-05 DIAGNOSIS — I499 Cardiac arrhythmia, unspecified: Secondary | ICD-10-CM | POA: Diagnosis not present

## 2017-09-05 DIAGNOSIS — N179 Acute kidney failure, unspecified: Secondary | ICD-10-CM

## 2017-09-05 DIAGNOSIS — N390 Urinary tract infection, site not specified: Secondary | ICD-10-CM | POA: Diagnosis present

## 2017-09-05 DIAGNOSIS — E871 Hypo-osmolality and hyponatremia: Secondary | ICD-10-CM | POA: Diagnosis present

## 2017-09-05 HISTORY — DX: Unspecified atrial fibrillation: I48.91

## 2017-09-05 LAB — CBC WITH DIFFERENTIAL/PLATELET
BASOS PCT: 0 %
Basophils Absolute: 0 10*3/uL (ref 0.0–0.1)
EOS ABS: 0 10*3/uL (ref 0.0–0.7)
Eosinophils Relative: 0 %
HEMATOCRIT: 34.6 % — AB (ref 39.0–52.0)
HEMOGLOBIN: 12 g/dL — AB (ref 13.0–17.0)
LYMPHS ABS: 1.1 10*3/uL (ref 0.7–4.0)
Lymphocytes Relative: 9 %
MCH: 29.1 pg (ref 26.0–34.0)
MCHC: 34.7 g/dL (ref 30.0–36.0)
MCV: 83.8 fL (ref 78.0–100.0)
MONOS PCT: 4 %
Monocytes Absolute: 0.5 10*3/uL (ref 0.1–1.0)
NEUTROS PCT: 87 %
Neutro Abs: 11.2 10*3/uL — ABNORMAL HIGH (ref 1.7–7.7)
Platelets: 97 10*3/uL — ABNORMAL LOW (ref 150–400)
RBC: 4.13 MIL/uL — AB (ref 4.22–5.81)
RDW: 14.6 % (ref 11.5–15.5)
WBC: 12.9 10*3/uL — AB (ref 4.0–10.5)

## 2017-09-05 LAB — URINALYSIS, ROUTINE W REFLEX MICROSCOPIC
Bilirubin Urine: NEGATIVE
Glucose, UA: NEGATIVE
KETONES UR: NEGATIVE
Nitrite: NEGATIVE
PH: 5.5 (ref 5.0–8.0)
SQUAMOUS EPITHELIAL / LPF: NONE SEEN /HPF (ref <=5)
Specific Gravity, Urine: 1.015 (ref 1.001–1.03)

## 2017-09-05 LAB — MICROSCOPIC MESSAGE

## 2017-09-05 LAB — I-STAT TROPONIN, ED: TROPONIN I, POC: 0.01 ng/mL (ref 0.00–0.08)

## 2017-09-05 LAB — I-STAT CHEM 8, ED
BUN: 36 mg/dL — ABNORMAL HIGH (ref 6–20)
CHLORIDE: 93 mmol/L — AB (ref 101–111)
CREATININE: 1.5 mg/dL — AB (ref 0.61–1.24)
Calcium, Ion: 1.08 mmol/L — ABNORMAL LOW (ref 1.15–1.40)
GLUCOSE: 127 mg/dL — AB (ref 65–99)
HEMATOCRIT: 36 % — AB (ref 39.0–52.0)
HEMOGLOBIN: 12.2 g/dL — AB (ref 13.0–17.0)
POTASSIUM: 3.6 mmol/L (ref 3.5–5.1)
Sodium: 129 mmol/L — ABNORMAL LOW (ref 135–145)
TCO2: 26 mmol/L (ref 22–32)

## 2017-09-05 LAB — INFLUENZA PANEL BY PCR (TYPE A & B)
Influenza A By PCR: NEGATIVE
Influenza B By PCR: NEGATIVE

## 2017-09-05 LAB — HEPATIC FUNCTION PANEL
ALBUMIN: 3.2 g/dL — AB (ref 3.5–5.0)
ALK PHOS: 72 U/L (ref 38–126)
ALT: 29 U/L (ref 17–63)
AST: 44 U/L — AB (ref 15–41)
Bilirubin, Direct: 0.3 mg/dL (ref 0.1–0.5)
Indirect Bilirubin: 1 mg/dL — ABNORMAL HIGH (ref 0.3–0.9)
TOTAL PROTEIN: 5.9 g/dL — AB (ref 6.5–8.1)
Total Bilirubin: 1.3 mg/dL — ABNORMAL HIGH (ref 0.3–1.2)

## 2017-09-05 LAB — BRAIN NATRIURETIC PEPTIDE: B NATRIURETIC PEPTIDE 5: 260.7 pg/mL — AB (ref 0.0–100.0)

## 2017-09-05 MED ORDER — SODIUM CHLORIDE 0.9 % IV SOLN: INTRAVENOUS | Status: DC

## 2017-09-05 MED ORDER — FUROSEMIDE 10 MG/ML IJ SOLN: 40.0000 mg | Freq: Once | INTRAMUSCULAR | Status: DC

## 2017-09-05 MED ORDER — CLONIDINE HCL 0.1 MG PO TABS
0.1000 mg | ORAL_TABLET | Freq: Every day | ORAL | Status: DC
  Administered 2017-09-05 – 2017-09-07 (×3): 0.1 mg via ORAL
  Filled 2017-09-05 (×3): qty 1

## 2017-09-05 MED ORDER — ACETAMINOPHEN 650 MG RE SUPP: 650.0000 mg | Freq: Four times a day (QID) | RECTAL | Status: DC | PRN

## 2017-09-05 MED ORDER — DOXAZOSIN MESYLATE 4 MG PO TABS
4.0000 mg | ORAL_TABLET | Freq: Every day | ORAL | Status: DC
  Administered 2017-09-06 – 2017-09-08 (×3): 4 mg via ORAL
  Filled 2017-09-05 (×4): qty 1

## 2017-09-05 MED ORDER — IPRATROPIUM-ALBUTEROL 0.5-2.5 (3) MG/3ML IN SOLN
3.0000 mL | Freq: Four times a day (QID) | RESPIRATORY_TRACT | Status: DC | PRN
  Administered 2017-09-05: 3 mL via RESPIRATORY_TRACT
  Filled 2017-09-05: qty 3

## 2017-09-05 MED ORDER — ALBUTEROL SULFATE (2.5 MG/3ML) 0.083% IN NEBU: 3.0000 mL | INHALATION_SOLUTION | Freq: Four times a day (QID) | RESPIRATORY_TRACT | Status: DC | PRN

## 2017-09-05 MED ORDER — AMLODIPINE BESYLATE 10 MG PO TABS
10.0000 mg | ORAL_TABLET | Freq: Every day | ORAL | Status: DC
  Administered 2017-09-06 – 2017-09-08 (×3): 10 mg via ORAL
  Filled 2017-09-05 (×3): qty 1

## 2017-09-05 MED ORDER — RIVAROXABAN 20 MG PO TABS
20.0000 mg | ORAL_TABLET | Freq: Every day | ORAL | Status: DC
  Administered 2017-09-05 – 2017-09-07 (×3): 20 mg via ORAL
  Filled 2017-09-05 (×3): qty 1

## 2017-09-05 MED ORDER — ACETAMINOPHEN 325 MG PO TABS: 650.0000 mg | ORAL_TABLET | Freq: Four times a day (QID) | ORAL | Status: DC | PRN

## 2017-09-05 NOTE — Progress Notes (Signed)
Brief Cardiology Note  Patient assessed due to low HR.  Patient has been bradycardic since admission earlier today, with a HR as low 29 at 6pm.  Since then he has mostly been in the 30s but occasionally will drop to 28-29.  Rhythm is slow, conducted AF without evidence of CHB.  BP has remained stable in the 130s and patient is easily arousable with no symptoms.  Given absence of symptoms, and stable BP and rhythm without significant pauses, will continue close observation.  Should he become symptomatic or hypotensive, could trial atropine 0.5 mg, or transfer to ICU for temporary transvenous pacer.  Would consider stopping clonidine as this has been associated with bradycardia and AVN dysfunction (though likely not causative in this case).  Would consider holding rivaroxaban as patient is likely to need permanent pacing.  Garrison [place], MD 11:38 PM

## 2017-09-05 NOTE — Progress Notes (Signed)
Subjective:    Patient ID: Austin Gray., male    DOB: 06/06/1932, 84 y.o.   MRN: [id number]  HPI Patient's symptoms started 2 weeks ago with a runny nose and a nonproductive cough.  He presents today complaining of shortness of breath.  Patient is in atrial fibrillation.  His heart rate is extremely  low.  He reports dyspnea on exertion.  He reports orthopnea.  He states that he gets strangled when he lays down.  His weight is up 14 lbs since his last office visit.  He has pitting edema in both legs +1.  Wt Readings from Last 3 Encounters:  09/05/17 270 lb (122.5 kg)  03/17/17 256 lb (116.1 kg)  02/21/17 254 lb 3.2 oz (115.3 kg)   Past Medical History:  Diagnosis Date  . Anxiety   . Arthritis    "little bit; left knee"  . Basal cell carcinoma   . Cataracts, bilateral    to have left eye cataract surgery on 03/21/17   . CHF (congestive heart failure), NYHA class II, chronic, diastolic ([place])   . Colon polyps    last colonoscopy 2015 (tubular adenoma)  . Depression   . Diverticulosis   . Dyspnea    with exertion   . Edema    lower extremities   . Gastritis   . GERD (gastroesophageal reflux disease)   . H/O: GI bleed    Recurrent  . Heart murmur   . Hemorrhoids   . History of blood transfusion   . History of BPH   . History of kidney stones    hx of x 1   . Hypertension   . Pernicious anemia   . PVC (premature ventricular contraction)   . Rectal bleeding 02/15/12  . Visual impairment   . Vitamin D deficiency    Past Surgical History:  Procedure Laterality Date  . COLONOSCOPY WITH PROPOFOL N/A 03/17/2017   Procedure: COLONOSCOPY WITH PROPOFOL;  Surgeon: Carol Ada, MD;  Location: [facility];  Service: Endoscopy;  Laterality: N/A;  . [place]; ~ 2002   left; right   Current Outpatient Medications on File Prior to Visit  Medication Sig Dispense Refill  . acetaminophen (TYLENOL) 500 MG tablet Take 1,000 mg by mouth every 6 (six) hours as  needed for moderate pain.    Marland Kitchen albuterol (PROVENTIL HFA;VENTOLIN HFA) 108 (90 Base) MCG/ACT inhaler Inhale 2 puffs into the lungs every 6 (six) hours as needed for wheezing or shortness of breath. 1 Inhaler 0  . amLODipine (NORVASC) 10 MG tablet TAKE 1 TABLET (10 MG TOTAL) BY MOUTH DAILY. 90 tablet 3  . benazepril (LOTENSIN) 40 MG tablet TAKE 1 TABLET EVERY DAY 90 tablet 3  . cloNIDine (CATAPRES) 0.1 MG tablet TAKE 1 TABLET AT BEDTIME 90 tablet 3  . doxazosin (CARDURA) 4 MG tablet TAKE 1 TABLET EVERY DAY 90 tablet 3  . ferrous sulfate 325 (65 FE) MG tablet Take 325 mg by mouth every other day.     . furosemide (LASIX) 40 MG tablet Taking 80mg  qam & 40mg  qpm 90 tablet 1  . metoprolol succinate (TOPROL-XL) 50 MG 24 hr tablet Take 2 tablets (100 mg total) by mouth daily. Take with or immediately following a meal. 180 tablet 3  . multivitamin-lutein (OCUVITE-LUTEIN) CAPS Take 1 capsule by mouth daily.    . potassium chloride (K-DUR) 10 MEQ tablet TAKE 1 TABLET EVERY DAY 90 tablet 3  . XARELTO 20 MG TABS tablet  TAKE 1 TABLET EVERY DAY WITH SUPPER 90 tablet 3   Current Facility-Administered Medications on File Prior to Visit  Medication Dose Route Frequency Provider Last Rate Last Dose  . cyanocobalamin ((VITAMIN B-12)) injection 1,000 mcg  1,000 mcg Intramuscular Q30 days [name], MD   1,000 mcg at 07/25/17 1030   No Known Allergies Social History   Socioeconomic History  . Marital status: Widowed    Spouse name: Not on file  . Number of children: 1  . Years of education: Not on file  . Highest education level: Not on file  Social Needs  . Financial resource strain: Not on file  . Food insecurity - worry: Not on file  . Food insecurity - inability: Not on file  . Transportation needs - medical: Not on file  . Transportation needs - non-medical: Not on file  Occupational History  . Occupation: Retired  Tobacco Use  . Smoking status: Never Smoker  . Smokeless tobacco: Current  User    Types: Snuff  . Tobacco comment: 02/15/12 "still use snuff @ times; not daily"  Substance and Sexual Activity  . Alcohol use: No  . Drug use: No  . Sexual activity: No    Comment: widower, lives with granddaughter and great grandchildren.  Other Topics Concern  . Not on file  Social History Narrative   Lives with grand daughter.         Epworth Sleepiness Scale = 7 (as of 09/21/2015)      Review of Systems  All other systems reviewed and are negative.      Objective:   Physical Exam  Constitutional: He appears well-developed and well-nourished.  Non-toxic appearance. He has a sickly appearance. He does not appear ill.  HENT:  Right Ear: External ear normal.  Left Ear: External ear normal.  Nose: Nose normal.  Mouth/Throat: Oropharynx is clear and moist.  Neck: Neck supple. No JVD present.  Cardiovascular: An irregularly irregular rhythm present. Bradycardia present.  Pulmonary/Chest: Effort normal. No respiratory distress. He has no wheezes. He has no rales.  Abdominal: Soft. Bowel sounds are normal. He exhibits distension.  Musculoskeletal: He exhibits edema.  Lymphadenopathy:    He has no cervical adenopathy.      EKG reveals atrial fibrillation with a right axis deviation.  There is a prolonged QRS interval with what appears to be a left bundle branch block.  However there is profound bradycardia.    Assessment & Plan:  Burning with urination - Plan: Urinalysis, Routine w reflex microscopic  SOB (shortness of breath) - Plan: EKG 12-Lead  Patient has shortness of breath with profound bradycardia and atrial fibrillation.  He appears to be on too much medication.  However I do not feel that the patient is safe to go home with a heart rate of 32 bpm.  Therefore I recommended emergency room evaluation due to shortness of breath what appears to be fluid overload on exam, and symptomatic bradycardia.  Patient was transported via EMS

## 2017-09-05 NOTE — Progress Notes (Signed)
Dr. Charissa Bash paged at 2225:   "6E03:Austin Gray, H. Admit afib in the 30's. Patient with multiple episodes of HR in the high 20's. Patient is asymptomatic. Pads are at bedside. BP stable. Any additional orders?"

## 2017-09-05 NOTE — ED Notes (Signed)
Report attempt x 1 

## 2017-09-05 NOTE — ED Notes (Signed)
Report attempt x2.

## 2017-09-05 NOTE — ED Triage Notes (Signed)
Per EMS: Pt has been c/o SOB x 2 weeks and went to his PCP today. MD noticed HR was in the 30's and wanted Pt transported here. Pt is Asymptomatic, pt took Beta Blocker today. It is unknown if Pt has Hx of Bradycardia. A&Ox4. Pt has had a productive cough x 2 weeks as well.

## 2017-09-05 NOTE — ED Notes (Signed)
Gave pt a urinal 

## 2017-09-05 NOTE — ED Provider Notes (Addendum)
[facility] EMERGENCY DEPARTMENT Provider Note   CSN: [id number] Arrival date & time:        History   Chief Complaint Chief Complaint  Patient presents with  . Bradycardia    HPI Benjimen Kelley. is a 84 y.o. male.  Presents after going to his PCP office today for evaluation of bradycardia.  He came here by EMS.  No treatment was given.  He is going to see his PCP today, to evaluate cough which has been present for 1-1/2 weeks.  Cough is occasionally productive.  He was made n.p.o. for blood work, but did not have blood work taking it.  He denies recent nausea, vomiting.  Denies chest neck or back pain.  No known prior atrial fibrillation.  There are no known modifying factors.  HPI  Past Medical History:  Diagnosis Date  . Anxiety   . Arthritis    "little bit; left knee"  . Basal cell carcinoma   . Cataracts, bilateral    to have left eye cataract surgery on 03/21/17   . CHF (congestive heart failure), NYHA class II, chronic, diastolic ([place])   . Colon polyps    last colonoscopy 2015 (tubular adenoma)  . Depression   . Diverticulosis   . Gastritis   . GERD (gastroesophageal reflux disease)   . H/O: GI bleed    Recurrent  . Hemorrhoids   . History of blood transfusion   . History of BPH   . History of kidney stones    hx of x 1   . Hypertension   . Pernicious anemia   . PVC (premature ventricular contraction)   . Rectal bleeding 02/15/12  . Visual impairment   . Vitamin D deficiency     Patient Active Problem List   Diagnosis Date Noted  . Non-rheumatic mitral regurgitation 02/22/2017  . Atrial fibrillation, persistent ([place]) 09/22/2015  . Systolic murmur 90/24/0973  . Lower GI bleeding 02/15/2012  . Hypotension 02/15/2012  . History of BPH   . Diverticulosis   . Colon polyps   . Visual impairment   . Hypertension   . Acute blood loss anemia     Past Surgical History:  Procedure Laterality Date  . COLONOSCOPY WITH PROPOFOL N/A  03/17/2017   Procedure: COLONOSCOPY WITH PROPOFOL;  Surgeon: Carol Ada, MD;  Location: [facility];  Service: Endoscopy;  Laterality: N/A;  . [place]; ~ 2002   left; right       Home Medications    Prior to Admission medications   Medication Sig Start Date End Date Taking? Authorizing Provider  acetaminophen (TYLENOL) 500 MG tablet Take 1,000 mg by mouth every 6 (six) hours as needed for moderate pain.   Yes [provider]  albuterol (PROVENTIL HFA;VENTOLIN HFA) 108 (90 Base) MCG/ACT inhaler Inhale 2 puffs into the lungs every 6 (six) hours as needed for wheezing or shortness of breath. 06/28/16  Yes [name], MD  amLODipine (NORVASC) 10 MG tablet TAKE 1 TABLET (10 MG TOTAL) BY MOUTH DAILY. 03/29/17  Yes [name], MD  benazepril (LOTENSIN) 40 MG tablet TAKE 1 TABLET EVERY DAY Patient taking differently: 40mg  by mouth once daily 03/29/17  Yes Pickard, Cammie Mcgee, MD  cloNIDine (CATAPRES) 0.1 MG tablet TAKE 1 TABLET AT BEDTIME Patient taking differently: 0.1mg  by mouth at bedtime 03/29/17  Yes [name], MD  doxazosin (CARDURA) 4 MG tablet TAKE 1 TABLET EVERY DAY Patient taking differently: 4mg   by mouth once daily 03/29/17  Yes [name], MD  ferrous sulfate 325 (65 FE) MG tablet Take 325 mg by mouth every other day.    Yes [provider]  furosemide (LASIX) 40 MG tablet Taking 80mg  qam & 40mg  qpm Patient taking differently: Take 40-80 mg by mouth See admin instructions. 80mg  in am, 40mg  in pm 08/07/17  Yes Pickard, Cammie Mcgee, MD  guaiFENesin (ROBITUSSIN) 100 MG/5ML SOLN Take 5 mLs by mouth every 4 (four) hours as needed for cough or to loosen phlegm.   Yes [provider]  metoprolol succinate (TOPROL-XL) 50 MG 24 hr tablet Take 2 tablets (100 mg total) by mouth daily. Take with or immediately following a meal. 12/01/16  Yes Pickard, Cammie Mcgee, MD  Multiple Vitamin (MULTIVITAMIN) tablet Take 1 tablet by mouth  daily.   Yes [provider]  multivitamin-lutein (OCUVITE-LUTEIN) CAPS Take 1 capsule by mouth daily.   Yes [provider]  oxaprozin (DAYPRO) 600 MG tablet Take 600 mg by mouth daily.   Yes [provider]  potassium chloride (K-DUR) 10 MEQ tablet TAKE 1 TABLET EVERY DAY Patient taking differently: 78meq by mouth once daily 08/04/17  Yes Pickard, Cammie Mcgee, MD  XARELTO 20 MG TABS tablet TAKE 1 TABLET EVERY DAY WITH SUPPER Patient taking differently: 20mg  by mouth every evening 08/31/17  Yes [name], MD    Family History Family History  Problem Relation Age of Onset  . Stroke Father 60  . Cancer Mother     Social History Social History   Tobacco Use  . Smoking status: Never Smoker  . Smokeless tobacco: Current User    Types: Snuff  . Tobacco comment: 02/15/12 "still use snuff @ times; not daily"  Substance Use Topics  . Alcohol use: No  . Drug use: No     Allergies   Patient has no known allergies.   Review of Systems Review of Systems  All other systems reviewed and are negative.    Physical Exam Updated Vital Signs BP 119/80   Pulse (!) 44   Temp 98.3 F (36.8 C) (Oral)   Resp (!) 24   Ht 6' (1.829 m)   Wt 122.5 kg (270 lb)   SpO2 98%   BMI 36.62 kg/m   Physical Exam  Constitutional: He is oriented to person, place, and time. He appears well-developed. No distress.  Overweight, elderly, frail  HENT:  Head: Normocephalic and atraumatic.  Right Ear: External ear normal.  Left Ear: External ear normal.  Eyes: Conjunctivae and EOM are normal. Pupils are equal, round, and reactive to light.  Neck: Normal range of motion and phonation normal. Neck supple.  Cardiovascular: Normal heart sounds.  Irregular bradycardia  Pulmonary/Chest: Effort normal and breath sounds normal. He exhibits no bony tenderness.  Abdominal: Soft. There is no tenderness.  Musculoskeletal: Normal range of motion.  Neurological: He is alert and  oriented to person, place, and time. No cranial nerve deficit or sensory deficit. He exhibits normal muscle tone. Coordination normal.  Skin: Skin is warm, dry and intact.  Psychiatric: He has a normal mood and affect. His behavior is normal. Judgment and thought content normal.  Nursing note and vitals reviewed.    ED Treatments / Results  Labs (all labs ordered are listed, but only abnormal results are displayed) Labs Reviewed  CBC WITH DIFFERENTIAL/PLATELET - Abnormal; Notable for the following components:      Result Value   WBC 12.9 (*)  RBC 4.13 (*)    Hemoglobin 12.0 (*)    HCT 34.6 (*)    Platelets 97 (*)    Neutro Abs 11.2 (*)    All other components within normal limits  BRAIN NATRIURETIC PEPTIDE - Abnormal; Notable for the following components:   B Natriuretic Peptide 260.7 (*)    All other components within normal limits  HEPATIC FUNCTION PANEL - Abnormal; Notable for the following components:   Total Protein 5.9 (*)    Albumin 3.2 (*)    AST 44 (*)    Total Bilirubin 1.3 (*)    Indirect Bilirubin 1.0 (*)    All other components within normal limits  I-STAT CHEM 8, ED - Abnormal; Notable for the following components:   Sodium 129 (*)    Chloride 93 (*)    BUN 36 (*)    Creatinine, Ser 1.50 (*)    Glucose, Bld 127 (*)    Calcium, Ion 1.08 (*)    Hemoglobin 12.2 (*)    HCT 36.0 (*)    All other components within normal limits  I-STAT TROPONIN, ED    EKG  EKG Interpretation  Date/Time:  Tuesday September 05 2017 13:19:59 EST Ventricular Rate:  51 PR Interval:    QRS Duration: 198 QT Interval:  637 QTC Calculation: 527 R Axis:   94 Text Interpretation:  Atrial fibrillation Right bundle branch block No old tracing to compare Confirmed by Daleen Bo [id number]) on 09/05/2017 1:44:05 PM        CHA2DS2/VAS Stroke Risk Points      4 >= 2 Points: High Risk  1 - 1.99 Points: Medium Risk  0 Points: Low Risk    This is the only CHA2DS2/VAS Stroke Risk  Points available for the past  year.:  Change: N/A     Details    This score determines the patient's risk of having a stroke if the  patient has atrial fibrillation.       Points Metrics  1 Has Congestive Heart Failure:  Yes   0 Has Vascular Disease:  No   1 Has Hypertension:  Yes   2 Age:  42   0 Has Diabetes:  No   0 Had Stroke:  No  Had TIA:  No  Had thromboembolism:  No   0 Male:  No            Radiology Dg Chest Port 1 View  Result Date: 09/05/2017 CLINICAL DATA:  Increased cough and shortness of breath over the last few days. History of congestive heart failure. EXAM: PORTABLE CHEST 1 VIEW COMPARISON:  06/29/2016 FINDINGS: Chronically enlarged cardiac silhouette. Chronic scarring at the lung bases. No sign of edema or fluid overload. No effusions. No acute bone finding. IMPRESSION: Chronic cardiomegaly and basilar pulmonary scarring. No active process visible. Electronically Signed   By: Nelson Chimes M.D.   On: 09/05/2017 14:09    Procedures Procedures (including critical care time)  Medications Ordered in ED Medications  0.9 %  sodium chloride infusion (not administered)     Initial Impression / Assessment and Plan / ED Course  I have reviewed the triage vital signs and the nursing notes.  Pertinent labs & imaging results that were available during my care of the patient were reviewed by me and considered in my medical decision making (see chart for details).  Clinical Course as of Sep 05 1709  Tue Sep 05, 2017  1541 MCHC: 34.7 [EW]    Clinical Course  User Index [EW] Daleen Bo, MD     Patient Vitals for the past 24 hrs:  BP Temp Temp src Pulse Resp SpO2 Height Weight  09/05/17 1645 119/80 - - (!) 44 (!) 24 98 % - -  09/05/17 1630 (!) 137/59 - - (!) 36 15 97 % - -  09/05/17 1515 129/68 - - (!) 34 (!) 26 97 % - -  09/05/17 1445 (!) 125/57 - - 92 18 (!) 89 % - -  09/05/17 1345 (!) 141/106 - - (!) 34 (!) 22 97 % - -  09/05/17 1330 126/90 - - (!) 30  (!) 23 97 % - -  09/05/17 1327 - - - - - - 6' (1.829 m) 122.5 kg (270 lb)  09/05/17 1322 (!) 120/59 98.3 F (36.8 C) Oral (!) 36 19 97 % - -    3:41 PM Reevaluation with update and discussion. After initial assessment and treatment, an updated evaluation reveals no change in clinical status, he remains alert, with stable blood pressure. Daleen Bo      Final Clinical Impressions(s) / ED Diagnoses   Final diagnoses:  Bradycardia  Atrial fibrillation, unspecified type ([place])  AKI (acute kidney injury) ([place])   Apparent  atrial fibrillation, slow rate with normal potassium.  Patient on beta-blocker which is likely source for slow heart rate.  He is on Xarelto.  Patient has elevated chads score and will require anticoagulation, to reduce stroke risk.  He will require admission.  He did not require pacing in the emergency department, maintained normal mental status.  Blood pressure remained stable, to slightly elevated.  Remainder of her evaluation included mild renal insufficiency on blood testing.  Creatinine 1.5, chloride low 93 and sodium low at 129.  White blood cell count mildly elevated 12.9 hemoglobin somewhat low at 12.0.  BNP is elevated 260.  Transaminases mildly elevated.  Total protein low 5.9.  Nursing Notes Reviewed/ Care Coordinated Applicable Imaging Reviewed Interpretation of Laboratory Data incorporated into ED treatment   Plan: [place]  ED Discharge Orders    None       Daleen Bo, MD 09/05/17 1656    Daleen Bo, MD 09/05/17 1711

## 2017-09-05 NOTE — H&P (Signed)
Austin [place]. is an 84 y.o. male.   Primary Cardiologist:  Austin Breeding MD PMD: Austin Frizzle, MD Chief Complaint: Shortness of breath  HPI: Austin [place] is a 84 year old male with history of diastolic heart failure and atrial fibrillation on metoprolol and xarelto that presents from his [facility] office with bradycardia and atrial fibrillation with symptoms of volume overload that included shortness of breath and lower extremity edema. Patient states that for the past 2 weeks he has had increasing shortness of breath and leg swelling.  He reports a recent upper respiratory infection at that time.  He states he takes lasix 80mg  in the morning and 40mg  in the evening without fail.  Reports recent increase in liquid intake since he was sick and was afraid to become dehydrated.    Past Medical History:  Diagnosis Date  . Anxiety   . Arthritis    "little bit; left knee"  . Basal cell carcinoma   . Cataracts, bilateral    to have left eye cataract surgery on 03/21/17   . CHF (congestive heart failure), NYHA class II, chronic, diastolic ([place])   . Colon polyps    last colonoscopy 2015 (tubular adenoma)  . Depression   . Diverticulosis   . Gastritis   . GERD (gastroesophageal reflux disease)   . H/O: GI bleed    Recurrent  . Hemorrhoids   . History of blood transfusion   . History of BPH   . History of kidney stones    hx of x 1   . Hypertension   . Pernicious anemia   . PVC (premature ventricular contraction)   . Rectal bleeding 02/15/12  . Visual impairment   . Vitamin D deficiency     Past Surgical History:  Procedure Laterality Date  . COLONOSCOPY WITH PROPOFOL N/A 03/17/2017   Procedure: COLONOSCOPY WITH PROPOFOL;  Surgeon: Carol Ada, MD;  Location: [facility];  Service: Endoscopy;  Laterality: N/A;  . [place]; ~ 2002   left; right    Family History  Problem Relation Age of Onset  . Stroke Father 65  . Cancer  Mother    Social History:  reports that  has never smoked. His smokeless tobacco use includes snuff. He reports that he does not drink alcohol or use drugs.  Allergies: No Known Allergies   (Not in a hospital admission)  Results for orders placed or performed during the hospital encounter of 09/05/17 (from the past 48 hour(s))  CBC with Differential     Status: Abnormal   Collection Time: 09/05/17  1:35 PM  Result Value Ref Range   WBC 12.9 (H) 4.0 - 10.5 K/uL   RBC 4.13 (L) 4.22 - 5.81 MIL/uL   Hemoglobin 12.0 (L) 13.0 - 17.0 g/dL   HCT 34.6 (L) 39.0 - 52.0 %   MCV 83.8 78.0 - 100.0 fL   MCH 29.1 26.0 - 34.0 pg   MCHC 34.7 30.0 - 36.0 g/dL   RDW 14.6 11.5 - 15.5 %   Platelets 97 (L) 150 - 400 K/uL    Comment: REPEATED TO VERIFY SPECIMEN CHECKED FOR CLOTS PLATELET COUNT CONFIRMED BY SMEAR    Neutrophils Relative % 87 %   Neutro Abs 11.2 (H) 1.7 - 7.7 K/uL   Lymphocytes Relative 9 %   Lymphs Abs 1.1 0.7 - 4.0 K/uL   Monocytes Relative 4 %   Monocytes Absolute 0.5 0.1 - 1.0 K/uL   Eosinophils Relative  0 %   Eosinophils Absolute 0.0 0.0 - 0.7 K/uL   Basophils Relative 0 %   Basophils Absolute 0.0 0.0 - 0.1 K/uL    Comment: Performed at [facility] [address] [place], [place] [address]  Brain natriuretic peptide     Status: Abnormal   Collection Time: 09/05/17  1:35 PM  Result Value Ref Range   B Natriuretic Peptide 260.7 (H) 0.0 - 100.0 pg/mL    Comment: Performed at [facility] [address] [place], [place] [address]  Hepatic function panel     Status: Abnormal   Collection Time: 09/05/17  1:35 PM  Result Value Ref Range   Total Protein 5.9 (L) 6.5 - 8.1 g/dL   Albumin 3.2 (L) 3.5 - 5.0 g/dL   AST 44 (H) 15 - 41 U/L   ALT 29 17 - 63 U/L   Alkaline Phosphatase 72 38 - 126 U/L   Total Bilirubin 1.3 (H) 0.3 - 1.2 mg/dL   Bilirubin, Direct 0.3 0.1 - 0.5 mg/dL   Indirect Bilirubin 1.0 (H) 0.3 - 0.9 mg/dL    Comment: Performed at [facility] [address] [place], [place] [address]  I-stat troponin, ED     Status: None   Collection Time: 09/05/17  1:54 PM  Result Value Ref Range   Troponin i, poc 0.01 0.00 - 0.08 ng/mL   Comment 3            Comment: Due to the release kinetics of cTnI, a negative result within the first hours of the onset of symptoms does not rule out myocardial infarction with certainty. If myocardial infarction is still suspected, repeat the test at appropriate intervals.   I-stat Chem 8, ED     Status: Abnormal   Collection Time: 09/05/17  3:07 PM  Result Value Ref Range   Sodium 129 (L) 135 - 145 mmol/L   Potassium 3.6 3.5 - 5.1 mmol/L   Chloride 93 (L) 101 - 111 mmol/L   BUN 36 (H) 6 - 20 mg/dL   Creatinine, Ser 1.50 (H) 0.61 - 1.24 mg/dL   Glucose, Bld 127 (H) 65 - 99 mg/dL   Calcium, Ion 1.08 (L) 1.15 - 1.40 mmol/L   TCO2 26 22 - 32 mmol/L   Hemoglobin 12.2 (L) 13.0 - 17.0 g/dL   HCT 36.0 (L) 39.0 - 52.0 %   Dg Chest Port 1 View  Result Date: 09/05/2017 CLINICAL DATA:  Increased cough and shortness of breath over the last few days. History of congestive heart failure. EXAM: PORTABLE CHEST 1 VIEW COMPARISON:  06/29/2016 FINDINGS: Chronically enlarged cardiac silhouette. Chronic scarring at the lung bases. No sign of edema or fluid overload. No effusions. No acute bone finding. IMPRESSION: Chronic cardiomegaly and basilar pulmonary scarring. No active process visible. Electronically Signed   By: Nelson Chimes M.D.   On: 09/05/2017 14:09    ROS: All review of systems negative except those noted per HPI  OBJECTIVE:   Vitals:   Vitals:   09/05/17 1445 09/05/17 1515 09/05/17 1630 09/05/17 1645  BP: (!) 125/57 129/68 (!) 137/59 119/80  Pulse: 92 (!) 34 (!) 36 (!) 44  Resp: 18 (!) 26 15 (!) 24  Temp:      TempSrc:      SpO2: (!) 89% 97% 97% 98%  Weight:      Height:       I&O's:  No intake or output data in the 24  hours ending 09/05/17 1714   Telemetry:  Atrial fibrillation with  bradycardia   PHYSICAL EXAM General: Well developed, well nourished, in no acute distress Head:   Normal cephalic and atramatic  Lungs:  Bronchial breath sounds throughout. Heart:  Irregularly Irregular.   Abdomen: abdomen soft and non-tender Msk:  Back normal,  Normal strength and tone for age. Extremities:  2+ pitting edema in lower extremities bilaterally to the knees.   Neuro: Alert and oriented. Psych:  Normal affect, responds appropriately  LABS: Basic Metabolic Panel: Recent Labs    09/05/17 1507  NA 129*  K 3.6  CL 93*  GLUCOSE 127*  BUN 36*  CREATININE 1.50*   Liver Function Tests: Recent Labs    09/05/17 1335  AST 44*  ALT 29  ALKPHOS 72  BILITOT 1.3*  PROT 5.9*  ALBUMIN 3.2*   No results for input(s): LIPASE, AMYLASE in the last 72 hours. CBC: Recent Labs    09/05/17 1335 09/05/17 1507  WBC 12.9*  --   NEUTROABS 11.2*  --   HGB 12.0* 12.2*  HCT 34.6* 36.0*  MCV 83.8  --   PLT 97*  --    Cardiac Enzymes: No results for input(s): CKTOTAL, CKMB, CKMBINDEX, TROPONINI in the last 72 hours. BNP: Invalid input(s): POCBNP D-Dimer: No results for input(s): DDIMER in the last 72 hours. Hemoglobin A1C: No results for input(s): HGBA1C in the last 72 hours. Fasting Lipid Panel: No results for input(s): CHOL, HDL, LDLCALC, TRIG, CHOLHDL, LDLDIRECT in the last 72 hours. Thyroid Function Tests: No results for input(s): TSH, T4TOTAL, T3FREE, THYROIDAB in the last 72 hours.  Invalid input(s): FREET3 Anemia Panel: No results for input(s): VITAMINB12, FOLATE, FERRITIN, TIBC, IRON, RETICCTPCT in the last 72 hours. Coag Panel:   Lab Results  Component Value Date   INR 1.13 02/15/2012    Assessment/Plan  Bradycardia Patient presents from family medicine office earlier today for evaluation of profound bradycardia. In the ER heart rates are in the 30s and blood pressures stable. Per recent office visits, heart rates are usually in the 80s-90s.  EKG shows  bradycardia.  Patient takes metoprolol succinate 100mg  daily and states he has been adherent to this amount.  Will hold beta-blocker at this time. -hold beta blocker - Repeat Echo  Chronic Atrial fibrillation Patient has chronic afib with CHA2DS2-VASc of 3.  Currently on xarelto.  He reports adherence to this medication.  Chronic Diastolic Heart Failure Patient reports an increase in weight recently, shortness of breath and increased swelling in his lower extremities.  BNP is 260.  Chest x-ray without pulmonary edema. Patient takes lasix 80mg  in the morning and 40mg  in the evening.  Will likely need IV diuresis while inpatient.  -one dose of IV lasix 40 -strict I/Os -keep legs elevated  AKI Creatinine 1.5, baseline at 0.9.  Will diurese with close monitoring of BMETs -bmet  -holding Ace-I -avoing nephrotoxic agents  URI Likely viral.  Getting flu test.  No need for antibiotics or steriods at this time. -flu test  Austin [place] 09/05/2017, 5:14 PM   History and all data above reviewed.  Patient examined.  I agree with the findings as above.  The patient presents with SOB.  He has atrial fib and presented to his PCP and was having increased SOB x 2 weeks.  Cough with some white sputum.  He has not had fevers or chills.  He has not had PND or orthopnea although he has had some increased swelling in  his legs.  He was sent to the ED by PCP as his heart rate was low.  He has a mildly increased BNP and his CXR does suggest significant volume overload.  Creat is slightly elevated.   The patient exam reveals BWL:[id number]   ,  Lungs: Decreased breath sounds without crackles  ,  Abd: Obese, positive bowel sounds, Ext Moderate edema  .  All available labs, radiology testing, previous records reviewed. Agree with documented assessment and plan. BRADYCARDIA:  Hold beta blocker.  Follow.  No indication for pacing.  RESPIRATORY DISTRESS:  Could be primary pulmonary.  We are going to treat with  bronchodilators.  Hold off on antibiotics for now.  I will give one dose of Lasix in the ED but I don't strongly suspect that the primary issue is heart failure.  AKI:  Increased create.  Will hold ARB for now and be careful with diuresis.   Follow creat and decide on diuretic dosing in the AM.    [place]  5:54 PM  09/05/2017

## 2017-09-06 ENCOUNTER — Other Ambulatory Visit: Payer: Self-pay

## 2017-09-06 ENCOUNTER — Inpatient Hospital Stay (HOSPITAL_COMMUNITY): Payer: Medicare HMO

## 2017-09-06 DIAGNOSIS — I340 Nonrheumatic mitral (valve) insufficiency: Secondary | ICD-10-CM

## 2017-09-06 LAB — URINALYSIS, COMPLETE (UACMP) WITH MICROSCOPIC
BILIRUBIN URINE: NEGATIVE
Glucose, UA: NEGATIVE mg/dL
KETONES UR: NEGATIVE mg/dL
Nitrite: NEGATIVE
Protein, ur: 100 mg/dL — AB
SPECIFIC GRAVITY, URINE: 1.014 (ref 1.005–1.030)
Squamous Epithelial / LPF: NONE SEEN
pH: 5 (ref 5.0–8.0)

## 2017-09-06 LAB — BASIC METABOLIC PANEL
Anion gap: 14 (ref 5–15)
BUN: 43 mg/dL — AB (ref 6–20)
CO2: 20 mmol/L — AB (ref 22–32)
CREATININE: 1.57 mg/dL — AB (ref 0.61–1.24)
Calcium: 8.5 mg/dL — ABNORMAL LOW (ref 8.9–10.3)
Chloride: 95 mmol/L — ABNORMAL LOW (ref 101–111)
GFR calc non Af Amer: 39 mL/min — ABNORMAL LOW (ref >60)
GFR, EST AFRICAN AMERICAN: 45 mL/min — AB (ref >60)
Glucose, Bld: 108 mg/dL — ABNORMAL HIGH (ref 65–99)
Potassium: 4.7 mmol/L (ref 3.5–5.1)
Sodium: 129 mmol/L — ABNORMAL LOW (ref 135–145)

## 2017-09-06 LAB — ECHOCARDIOGRAM COMPLETE
HEIGHTINCHES: 70 in
Weight: 4236.36 oz

## 2017-09-06 LAB — MRSA PCR SCREENING: MRSA by PCR: NEGATIVE

## 2017-09-06 MED ORDER — ALBUTEROL SULFATE (2.5 MG/3ML) 0.083% IN NEBU
3.0000 mL | INHALATION_SOLUTION | Freq: Three times a day (TID) | RESPIRATORY_TRACT | Status: DC
  Administered 2017-09-06 (×3): 3 mL via RESPIRATORY_TRACT
  Filled 2017-09-06 (×4): qty 3

## 2017-09-06 MED ORDER — FLUTICASONE PROPIONATE 50 MCG/ACT NA SUSP
1.0000 | Freq: Every day | NASAL | Status: DC
  Administered 2017-09-06 – 2017-09-08 (×3): 1 via NASAL
  Filled 2017-09-06: qty 16

## 2017-09-06 MED ORDER — DM-GUAIFENESIN ER 30-600 MG PO TB12
2.0000 | ORAL_TABLET | Freq: Two times a day (BID) | ORAL | Status: DC
  Administered 2017-09-06 – 2017-09-08 (×5): 2 via ORAL
  Filled 2017-09-06: qty 2
  Filled 2017-09-06 (×2): qty 1
  Filled 2017-09-06: qty 2
  Filled 2017-09-06: qty 1
  Filled 2017-09-06 (×2): qty 2

## 2017-09-06 MED ORDER — PERFLUTREN LIPID MICROSPHERE
1.0000 mL | INTRAVENOUS | Status: AC | PRN
  Administered 2017-09-06: 4 mL via INTRAVENOUS
  Filled 2017-09-06: qty 10

## 2017-09-06 MED ORDER — NAPHAZOLINE-GLYCERIN 0.012-0.2 % OP SOLN
1.0000 [drp] | Freq: Four times a day (QID) | OPHTHALMIC | Status: DC | PRN
  Administered 2017-09-07: 2 [drp] via OPHTHALMIC
  Filled 2017-09-06: qty 15

## 2017-09-06 NOTE — Progress Notes (Signed)
  Echocardiogram 2D Echocardiogram with Definity has been performed.  Darlina Sicilian M 09/06/2017, 10:58 AM

## 2017-09-06 NOTE — Progress Notes (Signed)
Progress Note  Patient Name: Austin [place]. Date of Encounter: 09/06/2017  Primary Cardiologist:   No primary care provider on file.   Subjective   Thinks that his breathing is better.  No pain.  Had trouble keeping his feet up.    Inpatient Medications    Scheduled Meds: . amLODipine  10 mg Oral Daily  . cloNIDine  0.1 mg Oral QHS  . doxazosin  4 mg Oral Daily  . furosemide  40 mg Intravenous Once  . rivaroxaban  20 mg Oral Q supper   Continuous Infusions: . sodium chloride     PRN Meds: acetaminophen **OR** acetaminophen, albuterol, ipratropium-albuterol   Vital Signs    Vitals:   09/05/17 2115 09/06/17 0426 09/06/17 0732 09/06/17 0733  BP: (!) 135/53 128/60  139/62  Pulse: (!) 45 89 74 (!) 37  Resp: (!) 21     Temp: 98.7 F (37.1 C) 98.9 F (37.2 C)    TempSrc: Oral Oral    SpO2: 100% (!) 89%  98%  Weight:  264 lb 12.4 oz (120.1 kg)    Height:        Intake/Output Summary (Last 24 hours) at 09/06/2017 0745 Last data filed at 09/06/2017 0231 Gross per 24 hour  Intake 240 ml  Output 600 ml  Net -360 ml   Filed Weights   09/05/17 1327 09/05/17 2102 09/06/17 0426  Weight: 270 lb (122.5 kg) 265 lb 1.6 oz (120.2 kg) 264 lb 12.4 oz (120.1 kg)    Telemetry    Atrial fib with slow rate.  No sustained pauses - Personally Reviewed  ECG    NA - Personally Reviewed  Physical Exam   GEN: No acute distress.   Neck: No  JVD Cardiac: Irregular RR, no murmurs, rubs, or gallops.  Respiratory: Mildly decreased breath sounds.  Otherwise clear to auscultation bilaterally. GI: Soft, nontender, non-distended  MS: Mild edema; No deformity. Neuro:  Nonfocal  Psych: Normal affect   Labs    Chemistry Recent Labs  Lab 09/05/17 1335 09/05/17 1507 09/06/17 0456  NA  --  129* 129*  K  --  3.6 4.7  CL  --  93* 95*  CO2  --   --  20*  GLUCOSE  --  127* 108*  BUN  --  36* 43*  CREATININE  --  1.50* 1.57*  CALCIUM  --   --  8.5*  PROT 5.9*  --   --     ALBUMIN 3.2*  --   --   AST 44*  --   --   ALT 29  --   --   ALKPHOS 72  --   --   BILITOT 1.3*  --   --   GFRNONAA  --   --  39*  GFRAA  --   --  45*  ANIONGAP  --   --  14     Hematology Recent Labs  Lab 09/05/17 1335 09/05/17 1507  WBC 12.9*  --   RBC 4.13*  --   HGB 12.0* 12.2*  HCT 34.6* 36.0*  MCV 83.8  --   MCH 29.1  --   MCHC 34.7  --   RDW 14.6  --   PLT 97*  --     Cardiac EnzymesNo results for input(s): TROPONINI in the last 168 hours.  Recent Labs  Lab 09/05/17 1354  TROPIPOC 0.01     BNP Recent Labs  Lab 09/05/17 1335  BNP 260.7*  DDimer No results for input(s): DDIMER in the last 168 hours.   Radiology    Dg Chest Port 1 View  Result Date: 09/05/2017 CLINICAL DATA:  Increased cough and shortness of breath over the last few days. History of congestive heart failure. EXAM: PORTABLE CHEST 1 VIEW COMPARISON:  06/29/2016 FINDINGS: Chronically enlarged cardiac silhouette. Chronic scarring at the lung bases. No sign of edema or fluid overload. No effusions. No acute bone finding. IMPRESSION: Chronic cardiomegaly and basilar pulmonary scarring. No active process visible. Electronically Signed   By: Nelson Chimes M.D.   On: 09/05/2017 14:09    Cardiac Studies   ECHO:  Pending  Patient Profile     84 y.o. male with history of diastolic heart failure and atrial fibrillation on metoprolol and xarelto that presented from his family [facility] 66 office with bradycardia and atrial fibrillation with symptoms of volume overload that included shortness of breath and lower extremity edema.   Assessment & Plan    ACUTE DYSPNEA:   Slightly improved. Negative for flu.    Continue nebulizers.   Changed to scheduled rather than PRN.    BRADYCARDIA:  Beta blocker held.  No symptoms.    AKI:  Creat is elevated about his baseline. It is up slightly from yesterday.  He did get one dose of Lasix.  Holding diuretic and ACE inhibitor.    HYPONATREMIA:   Follow.  Restrict fluid.  ABNORMAL UA:  Does not look like a clean catch.    For questions or updates, please contact [facility] Please consult [email or web address] for contact info under Cardiology/STEMI.   Signed, Minus Breeding, MD  09/06/2017, 7:45 AM

## 2017-09-07 ENCOUNTER — Inpatient Hospital Stay (HOSPITAL_COMMUNITY): Payer: Medicare HMO

## 2017-09-07 ENCOUNTER — Encounter (HOSPITAL_COMMUNITY): Payer: Self-pay | Admitting: Internal Medicine

## 2017-09-07 ENCOUNTER — Telehealth: Payer: Self-pay | Admitting: Cardiology

## 2017-09-07 DIAGNOSIS — E871 Hypo-osmolality and hyponatremia: Secondary | ICD-10-CM | POA: Diagnosis present

## 2017-09-07 DIAGNOSIS — R0600 Dyspnea, unspecified: Secondary | ICD-10-CM | POA: Diagnosis not present

## 2017-09-07 DIAGNOSIS — N390 Urinary tract infection, site not specified: Secondary | ICD-10-CM | POA: Diagnosis present

## 2017-09-07 LAB — BASIC METABOLIC PANEL
ANION GAP: 10 (ref 5–15)
BUN: 43 mg/dL — ABNORMAL HIGH (ref 6–20)
CO2: 25 mmol/L (ref 22–32)
Calcium: 8.7 mg/dL — ABNORMAL LOW (ref 8.9–10.3)
Chloride: 96 mmol/L — ABNORMAL LOW (ref 101–111)
Creatinine, Ser: 1.66 mg/dL — ABNORMAL HIGH (ref 0.61–1.24)
GFR calc Af Amer: 42 mL/min — ABNORMAL LOW (ref >60)
GFR, EST NON AFRICAN AMERICAN: 37 mL/min — AB (ref >60)
Glucose, Bld: 143 mg/dL — ABNORMAL HIGH (ref 65–99)
POTASSIUM: 3.5 mmol/L (ref 3.5–5.1)
Sodium: 131 mmol/L — ABNORMAL LOW (ref 135–145)

## 2017-09-07 LAB — CBC
HEMATOCRIT: 36.2 % — AB (ref 39.0–52.0)
HEMOGLOBIN: 12 g/dL — AB (ref 13.0–17.0)
MCH: 28.2 pg (ref 26.0–34.0)
MCHC: 33.1 g/dL (ref 30.0–36.0)
MCV: 85.2 fL (ref 78.0–100.0)
Platelets: 126 10*3/uL — ABNORMAL LOW (ref 150–400)
RBC: 4.25 MIL/uL (ref 4.22–5.81)
RDW: 14.8 % (ref 11.5–15.5)
WBC: 13 10*3/uL — ABNORMAL HIGH (ref 4.0–10.5)

## 2017-09-07 MED ORDER — IPRATROPIUM-ALBUTEROL 0.5-2.5 (3) MG/3ML IN SOLN
3.0000 mL | RESPIRATORY_TRACT | Status: DC
  Administered 2017-09-07 (×3): 3 mL via RESPIRATORY_TRACT
  Filled 2017-09-07 (×3): qty 3

## 2017-09-07 MED ORDER — ALBUTEROL SULFATE (2.5 MG/3ML) 0.083% IN NEBU
3.0000 mL | INHALATION_SOLUTION | Freq: Three times a day (TID) | RESPIRATORY_TRACT | Status: DC
  Administered 2017-09-07: 3 mL via RESPIRATORY_TRACT
  Filled 2017-09-07: qty 3

## 2017-09-07 MED ORDER — IPRATROPIUM-ALBUTEROL 0.5-2.5 (3) MG/3ML IN SOLN
3.0000 mL | Freq: Four times a day (QID) | RESPIRATORY_TRACT | Status: DC
  Administered 2017-09-08 (×2): 3 mL via RESPIRATORY_TRACT
  Filled 2017-09-07 (×3): qty 3

## 2017-09-07 MED ORDER — IPRATROPIUM-ALBUTEROL 0.5-2.5 (3) MG/3ML IN SOLN: 3.0000 mL | RESPIRATORY_TRACT | Status: DC

## 2017-09-07 MED ORDER — NICOTINE 7 MG/24HR TD PT24
7.0000 mg | MEDICATED_PATCH | Freq: Every day | TRANSDERMAL | Status: DC
  Administered 2017-09-07 – 2017-09-08 (×2): 7 mg via TRANSDERMAL
  Filled 2017-09-07 (×2): qty 1

## 2017-09-07 MED ORDER — SODIUM CHLORIDE 0.9 % IV SOLN
1.0000 g | INTRAVENOUS | Status: DC
  Administered 2017-09-07 – 2017-09-08 (×2): 1 g via INTRAVENOUS
  Filled 2017-09-07 (×2): qty 10

## 2017-09-07 NOTE — Telephone Encounter (Signed)
Error

## 2017-09-07 NOTE — H&P (Signed)
Austin E Smyre Jr. OHY:[id number] DOB: 1931/10/24 DOA: 09/05/2017 PCP: [name], MD   Requesting physician: Percival Spanish Date of consultation: 09/07/17 Reason for consultation: dyspnea and uti  Impression/Recommendations Principal Problem:   Dyspnea Active Problems:   Bradycardia   Urinary tract infection   Hyponatremia  #1. Dyspnea. Patient with a history of dyspnea on exertion. Chest x-ray 3 days ago reveals chronic cardiomegaly and basilar pulmonary scarring no active process visible. His chest x-rays also note calcified granuloma on right. Oxygen saturation level well above 90% on room air. He does use when necessary inhaler at home and has required more frequent usage of late. Not likely CHF exacerbation or cardiology. Exam seems more like an inflammatory process. -Scheduled meds -continue flutter valve -Continue Mucinex -Obtain chest x-ray -Obtain a CBC -monitor oxygen saturation level  #2. Urinary tract infection. Patient reports burning with urination. Urinalysis yesterday reveals moderate leukocytes many bacteria. Patient is afebrile nontoxic appearing -Follow urine culture -rocephin  #3. Hyponatremia.  Sodium 129. Sodium on admission 129. Good restriction per cardiology. During diuretic as well per cardiology. Repeat be met pending -per cardiology   [facility] will followup again tomorrow. Please contact [facility] can be of assistance in the meanwhile. Thank you for this consultation.  Chief Complaint: dyspnea  HPI: Austin [place] is an 84 year old with a medical history significant for diabetes, A. fib, bradycardia, obesity, chronic diastolic heart failure, GERD, hypertension admitted to the hospital per cardiology service 3 days ago for bradycardia. [facility] hospitalists are asked to consult for dyspnea and urinary tract infection  Information is obtained from the patient. He states over the last several weeks he has had uses when necessary  inhaler 2-3 times a day which is more than what he is accustomed to. He is not on home oxygen. He says he is always got short of breath with exertion but now is feeling more short of breath at rest. Associated symptoms include some audible wheezing with frequent coughing. He states that "every now and then" he produces sputum. He denies headache dizziness syncope or near-syncope. He denies any fever chills nausea vomiting diarrhea constipation melena bright red blood per rectum. He also has some worsening lower extremity edema that he says "Dr. Michelle Piper is worried about". He denies fever chills recent travels or sick contacts. He also complains of some burning with urination of late.   Review of Systems:  10 point review of systems complete and all systems are negative except as indicated in the history of present illness  Past Medical History:  Diagnosis Date  . Anxiety   . Arthritis    "little bit; left knee"  . Atrial fibrillation (HCC)    Chronic  . Basal cell carcinoma   . Cataracts, bilateral    to have left eye cataract surgery on 03/21/17   . CHF (congestive heart failure), NYHA class II, chronic, diastolic ([place])   . Colon polyps    last colonoscopy 2015 (tubular adenoma)  . Depression   . Diverticulosis   . Gastritis   . GERD (gastroesophageal reflux disease)   . H/O: GI bleed    Recurrent  . Hemorrhoids   . History of blood transfusion   . History of BPH   . History of kidney stones    hx of x 1   . Hypertension   . Pernicious anemia   . PVC (premature ventricular contraction)   . Rectal bleeding 02/15/12  . Visual impairment   . Vitamin  D deficiency    Past Surgical History:  Procedure Laterality Date  . COLONOSCOPY WITH PROPOFOL N/A 03/17/2017   Procedure: COLONOSCOPY WITH PROPOFOL;  Surgeon: Carol Ada, MD;  Location: [facility];  Service: Endoscopy;  Laterality: N/A;  . [place]; ~ 2002   left; right   Social History:  reports that  has  never smoked. His smokeless tobacco use includes snuff. He reports that he does not drink alcohol or use drugs.  No Known Allergies Family History  Problem Relation Age of Onset  . Stroke Father 71  . Cancer Mother     Prior to Admission medications   Medication Sig Start Date End Date Taking? Authorizing Provider  acetaminophen (TYLENOL) 500 MG tablet Take 1,000 mg by mouth every 6 (six) hours as needed for moderate pain.   Yes [provider]  albuterol (PROVENTIL HFA;VENTOLIN HFA) 108 (90 Base) MCG/ACT inhaler Inhale 2 puffs into the lungs every 6 (six) hours as needed for wheezing or shortness of breath. 06/28/16  Yes [name], MD  amLODipine (NORVASC) 10 MG tablet TAKE 1 TABLET (10 MG TOTAL) BY MOUTH DAILY. 03/29/17  Yes [name], MD  benazepril (LOTENSIN) 40 MG tablet TAKE 1 TABLET EVERY DAY Patient taking differently: 61m by mouth once daily 03/29/17  Yes Pickard, WCammie Mcgee MD  cloNIDine (CATAPRES) 0.1 MG tablet TAKE 1 TABLET AT BEDTIME Patient taking differently: 0.155mby mouth at bedtime 03/29/17  Yes PiSusy FrizzleMD  doxazosin (CARDURA) 4 MG tablet TAKE 1 TABLET EVERY DAY Patient taking differently: 27m72my mouth once daily 03/29/17  Yes PicSusy FrizzleD  ferrous sulfate 325 (65 FE) MG tablet Take 325 mg by mouth every other day.    Yes [provider]  furosemide (LASIX) 40 MG tablet Taking 35m12mm & 40mg30m Patient taking differently: Take 40-80 mg by mouth See admin instructions. 35mg 48mm, 40mg i81m 08/07/17  Yes Pickard, Warren Cammie McgeeuaiFENesin (ROBITUSSIN) 100 MG/5ML SOLN Take 5 mLs by mouth every 4 (four) hours as needed for cough or to loosen phlegm.   Yes [provider]  metoprolol succinate (TOPROL-XL) 50 MG 24 hr tablet Take 2 tablets (100 mg total) by mouth daily. Take with or immediately following a meal. 12/01/16  Yes Pickard, Warren Cammie Mcgeeultiple Vitamin (MULTIVITAMIN) tablet Take 1 tablet by mouth daily.    Yes [provider]  multivitamin-lutein (OCUVITE-LUTEIN) CAPS Take 1 capsule by mouth daily.   Yes [provider]  oxaprozin (DAYPRO) 600 MG tablet Take 600 mg by mouth daily.   Yes [provider]  potassium chloride (K-DUR) 10 MEQ tablet TAKE 1 TABLET EVERY DAY Patient taking differently: 10meq b28muth once daily 08/04/17  Yes Pickard, Warren TCammie McgeeRELTO 20 MG TABS tablet TAKE 1 TABLET EVERY DAY WITH SUPPER Patient taking differently: 20mg by 43mh every evening 08/31/17  Yes Pickard, Susy Frizzleysical Exam: Blood pressure 128/62, pulse (!) 37, temperature 98.8 F (37.1 C), temperature source Oral, resp. rate 20, height _0  (1.778 m), weight 119.4 kg (263 lb 4.8 oz), SpO2 100 %. Vitals:   09/07/17 0735 09/07/17 0857  BP:  128/62  Pulse:    Resp:    Temp:  98.8 F (37.1 C)  SpO2: 95% 100%     General:  Obese appears chronically ill sitting on the side of bed in no acute distress  Eyes: EOMI pupils  equal round reactive to light no scleral icterus  ENT: Ears clear nose without drainage oropharynx without erythema or exudate. Poor dentition  Neck: Supple no JVD full range of motion no lymphadenopathy  Cardiovascular: Irregularly irregular no murmur gallop or rub 1+ lower extremity edema  Respiratory: Mild increased work of breathing with conversation. Breath sounds quite diminished throughout some faint end expiratory wheezing particularly mid lobes and anteriorly. Crackles  Abdomen: Obese positive bowel sounds no guarding or rebounding  Skin: Warm and dry no rash or lesions  Musculoskeletal: Joints without swelling/erythema full range of motion  Psychiatric: Calm cooperative  Neurologic: Alert and oriented 3 speech clear facial symmetry  Labs on Admission:  Basic Metabolic Panel: Recent Labs  Lab 09/05/17 1507 09/06/17 0456  NA 129* 129*  K 3.6 4.7  CL 93* 95*  CO2  --  20*  GLUCOSE 127* 108*  BUN 36* 43*  CREATININE  1.50* 1.57*  CALCIUM  --  8.5*   Liver Function Tests: Recent Labs  Lab 09/05/17 1335  AST 44*  ALT 29  ALKPHOS 72  BILITOT 1.3*  PROT 5.9*  ALBUMIN 3.2*   No results for input(s): LIPASE, AMYLASE in the last 168 hours. No results for input(s): AMMONIA in the last 168 hours. CBC: Recent Labs  Lab 09/05/17 1335 09/05/17 1507  WBC 12.9*  --   NEUTROABS 11.2*  --   HGB 12.0* 12.2*  HCT 34.6* 36.0*  MCV 83.8  --   PLT 97*  --    Cardiac Enzymes: No results for input(s): CKTOTAL, CKMB, CKMBINDEX, TROPONINI in the last 168 hours. BNP: Invalid input(s): POCBNP CBG: No results for input(s): GLUCAP in the last 168 hours.  Radiological Exams on Admission: Dg Chest Port 1 View  Result Date: 09/05/2017 CLINICAL DATA:  Increased cough and shortness of breath over the last few days. History of congestive heart failure. EXAM: PORTABLE CHEST 1 VIEW COMPARISON:  06/29/2016 FINDINGS: Chronically enlarged cardiac silhouette. Chronic scarring at the lung bases. No sign of edema or fluid overload. No effusions. No acute bone finding. IMPRESSION: Chronic cardiomegaly and basilar pulmonary scarring. No active process visible. Electronically Signed   By: Nelson Chimes M.D.   On: 09/05/2017 14:09    EKG:   Time spent: 55 minutes  [facility]   If 7PM-7AM, please contact night-coverage [email or web address] Password [facility] 09/07/2017, 9:54 AM

## 2017-09-07 NOTE — Progress Notes (Signed)
Progress Note  Patient Name: Austin Gray. Date of Encounter: 09/07/2017  Primary Cardiologist:   No primary care provider on file.   Subjective   He is still SOB and coughing.  No pain.    Inpatient Medications    Scheduled Meds: . albuterol  3 mL Inhalation TID  . amLODipine  10 mg Oral Daily  . cloNIDine  0.1 mg Oral QHS  . dextromethorphan-guaiFENesin  2 tablet Oral BID  . doxazosin  4 mg Oral Daily  . fluticasone  1 spray Each Nare Daily  . furosemide  40 mg Intravenous Once  . rivaroxaban  20 mg Oral Q supper   Continuous Infusions: . sodium chloride     PRN Meds: acetaminophen **OR** acetaminophen, ipratropium-albuterol, naphazoline-glycerin   Vital Signs    Vitals:   09/06/17 2035 09/07/17 0033 09/07/17 0520 09/07/17 0735  BP:  (!) 104/59 122/61   Pulse:  (!) 57 (!) 37   Resp:  (!) 22 20   Temp: 97.6 F (36.4 C) 98.4 F (36.9 C) 98.8 F (37.1 C)   TempSrc: Oral Oral Oral   SpO2:  99% 99% 95%  Weight:   263 lb 4.8 oz (119.4 kg)   Height:        Intake/Output Summary (Last 24 hours) at 09/07/2017 0841 Last data filed at 09/07/2017 0600 Gross per 24 hour  Intake 700 ml  Output 1300 ml  Net -600 ml   Filed Weights   09/05/17 2102 09/06/17 0426 09/07/17 0520  Weight: 265 lb 1.6 oz (120.2 kg) 264 lb 12.4 oz (120.1 kg) 263 lb 4.8 oz (119.4 kg)    Telemetry    Atrial fib.  Rate still slow but no significant pauses - Personally Reviewed  ECG    NA - Personally Reviewed  Physical Exam   GEN: No  acute distress.   Neck: No  JVD Cardiac: IrregularRR, no murmurs, rubs, or gallops.  Respiratory:  Decreased breath sounds GI: Soft, nontender, non-distended, normal bowel sounds  MS:  Mild ankle edema; No deformity. Neuro:   Nonfocal  Psych: Oriented and appropriate    Labs    Chemistry Recent Labs  Lab 09/05/17 1335 09/05/17 1507 09/06/17 0456  NA  --  129* 129*  K  --  3.6 4.7  CL  --  93* 95*  CO2  --   --  20*  GLUCOSE  --   127* 108*  BUN  --  36* 43*  CREATININE  --  1.50* 1.57*  CALCIUM  --   --  8.5*  PROT 5.9*  --   --   ALBUMIN 3.2*  --   --   AST 44*  --   --   ALT 29  --   --   ALKPHOS 72  --   --   BILITOT 1.3*  --   --   GFRNONAA  --   --  39*  GFRAA  --   --  45*  ANIONGAP  --   --  14     Hematology Recent Labs  Lab 09/05/17 1335 09/05/17 1507  WBC 12.9*  --   RBC 4.13*  --   HGB 12.0* 12.2*  HCT 34.6* 36.0*  MCV 83.8  --   MCH 29.1  --   MCHC 34.7  --   RDW 14.6  --   PLT 97*  --     Cardiac EnzymesNo results for input(s): TROPONINI in the last 168 hours.  Recent Labs  Lab 09/05/17 1354  TROPIPOC 0.01     BNP Recent Labs  Lab 09/05/17 1335  BNP 260.7*     DDimer No results for input(s): DDIMER in the last 168 hours.   Radiology    Dg Chest Port 1 View  Result Date: 09/05/2017 CLINICAL DATA:  Increased cough and shortness of breath over the last few days. History of congestive heart failure. EXAM: PORTABLE CHEST 1 VIEW COMPARISON:  06/29/2016 FINDINGS: Chronically enlarged cardiac silhouette. Chronic scarring at the lung bases. No sign of edema or fluid overload. No effusions. No acute bone finding. IMPRESSION: Chronic cardiomegaly and basilar pulmonary scarring. No active process visible. Electronically Signed   By: Nelson Chimes M.D.   On: 09/05/2017 14:09    Cardiac Studies   ECHO:    Study Conclusions  - Left ventricle: The cavity size was normal. Wall thickness was   increased in a pattern of mild LVH. Systolic function was normal.   The estimated ejection fraction was in the range of 55% to 60%.   Wall motion was normal; there were no regional wall motion   abnormalities. - Ventricular septum: The contour showed diastolic flattening. - Aortic valve: Trileaflet; mildly thickened, mildly calcified   leaflets. There was trivial regurgitation. Valve area (Vmax):   2.44 cm^2. - Mitral valve: Calcified annulus. There was moderate   regurgitation. - Left  atrium: The atrium was mildly dilated. - Right ventricle: The cavity size was mildly dilated. Wall   thickness was normal. - Tricuspid valve: There was moderate regurgitation. - Pulmonary arteries: Systolic pressure was mildly increased. PA   peak pressure: 36 mm Hg (S).  Impressions:  - Compared to the prior study, there has been no significant   interval change.  Patient Profile     84 y.o. male with history of diastolic heart failure and atrial fibrillation on metoprolol and xarelto that presented from his [facility] office with bradycardia and atrial fibrillation with symptoms of volume overload that included shortness of breath and lower extremity edema.   Assessment & Plan    ACUTE DYSPNEA:    EF is unchanged from previous.  Continuing bronchodilators.  Holding on diuretics with his elevated creat.  I will ask the hospital service to see him to see if they think antibiotics or steroids or change in bronchodilators are indicated.  BRADYCARDIA:  Holding beta blocker.  No indication for pacing  AKI:    Repeat BMET today.     HYPONATREMIA:    Repeat BMET pending.   UTI:   Will start antibiotics.     For questions or updates, please contact [place] Please consult [email or web address] for contact info under Cardiology/STEMI.   Signed, Minus Breeding, MD  09/07/2017, 8:41 AM

## 2017-09-08 ENCOUNTER — Other Ambulatory Visit: Payer: Self-pay | Admitting: Cardiology

## 2017-09-08 DIAGNOSIS — N179 Acute kidney failure, unspecified: Secondary | ICD-10-CM

## 2017-09-08 DIAGNOSIS — I482 Chronic atrial fibrillation, unspecified: Secondary | ICD-10-CM

## 2017-09-08 DIAGNOSIS — E871 Hypo-osmolality and hyponatremia: Secondary | ICD-10-CM

## 2017-09-08 DIAGNOSIS — R0600 Dyspnea, unspecified: Secondary | ICD-10-CM

## 2017-09-08 DIAGNOSIS — N390 Urinary tract infection, site not specified: Secondary | ICD-10-CM

## 2017-09-08 LAB — BASIC METABOLIC PANEL
ANION GAP: 12 (ref 5–15)
BUN: 40 mg/dL — ABNORMAL HIGH (ref 6–20)
CALCIUM: 8.6 mg/dL — AB (ref 8.9–10.3)
CO2: 24 mmol/L (ref 22–32)
Chloride: 96 mmol/L — ABNORMAL LOW (ref 101–111)
Creatinine, Ser: 1.49 mg/dL — ABNORMAL HIGH (ref 0.61–1.24)
GFR, EST AFRICAN AMERICAN: 48 mL/min — AB (ref >60)
GFR, EST NON AFRICAN AMERICAN: 42 mL/min — AB (ref >60)
Glucose, Bld: 131 mg/dL — ABNORMAL HIGH (ref 65–99)
Potassium: 3.5 mmol/L (ref 3.5–5.1)
Sodium: 132 mmol/L — ABNORMAL LOW (ref 135–145)

## 2017-09-08 LAB — RESPIRATORY PANEL BY PCR
ADENOVIRUS-RVPPCR: NOT DETECTED
Bordetella pertussis: NOT DETECTED
CORONAVIRUS NL63-RVPPCR: NOT DETECTED
CORONAVIRUS OC43-RVPPCR: NOT DETECTED
Chlamydophila pneumoniae: NOT DETECTED
Coronavirus 229E: NOT DETECTED
Coronavirus HKU1: NOT DETECTED
INFLUENZA A-RVPPCR: NOT DETECTED
Influenza B: NOT DETECTED
METAPNEUMOVIRUS-RVPPCR: NOT DETECTED
Mycoplasma pneumoniae: NOT DETECTED
PARAINFLUENZA VIRUS 1-RVPPCR: NOT DETECTED
PARAINFLUENZA VIRUS 2-RVPPCR: NOT DETECTED
PARAINFLUENZA VIRUS 3-RVPPCR: NOT DETECTED
Parainfluenza Virus 4: NOT DETECTED
RHINOVIRUS / ENTEROVIRUS - RVPPCR: NOT DETECTED
Respiratory Syncytial Virus: NOT DETECTED

## 2017-09-08 LAB — CBC
HCT: 35.6 % — ABNORMAL LOW (ref 39.0–52.0)
HEMOGLOBIN: 11.8 g/dL — AB (ref 13.0–17.0)
MCH: 28.2 pg (ref 26.0–34.0)
MCHC: 33.1 g/dL (ref 30.0–36.0)
MCV: 85 fL (ref 78.0–100.0)
Platelets: 124 10*3/uL — ABNORMAL LOW (ref 150–400)
RBC: 4.19 MIL/uL — AB (ref 4.22–5.81)
RDW: 15 % (ref 11.5–15.5)
WBC: 11 10*3/uL — ABNORMAL HIGH (ref 4.0–10.5)

## 2017-09-08 MED ORDER — POLYETHYLENE GLYCOL 3350 17 G PO PACK: 17.0000 g | PACK | Freq: Every day | ORAL | Status: DC

## 2017-09-08 MED ORDER — BISACODYL 5 MG PO TBEC: 5.0000 mg | DELAYED_RELEASE_TABLET | Freq: Every day | ORAL | Status: DC | PRN

## 2017-09-08 MED ORDER — FLUTICASONE PROPIONATE 50 MCG/ACT NA SUSP: 1.0000 | Freq: Every day | NASAL | 1 refills | Status: DC

## 2017-09-08 MED ORDER — CEFUROXIME AXETIL 250 MG PO TABS: 250.0000 mg | ORAL_TABLET | Freq: Two times a day (BID) | ORAL | 0 refills | Status: DC

## 2017-09-08 NOTE — Progress Notes (Signed)
The patient and his family at bedside have been given discharge instructions along with a new medication list and what to take today. He has prescriptions to pick up and follow appointments. He is discharging with his family via car.   Saddie Benders RN

## 2017-09-08 NOTE — Discharge Summary (Signed)
Discharge Summary    Patient ID: Austin Gray.,  MRN: [id number], DOB/AGE: 1932/05/28 84 y.o.  Admit date: 09/05/2017 Discharge date: 09/08/2017  Primary Care Provider: Jenna Luo T Primary Cardiologist: Minus Breeding, MD  Discharge Diagnoses    Principal Problem:   Dyspnea Active Problems:   Bradycardia   Urinary tract infection   Hyponatremia   Allergies No Known Allergies  Diagnostic Studies/Procedures    Echocardiogram 09/06/2017 Study Conclusions - Left ventricle: The cavity size was normal. Wall thickness was   increased in a pattern of mild LVH. Systolic function was normal.   The estimated ejection fraction was in the range of 55% to 60%.   Wall motion was normal; there were no regional wall motion   abnormalities. - Ventricular septum: The contour showed diastolic flattening. - Aortic valve: Trileaflet; mildly thickened, mildly calcified   leaflets. There was trivial regurgitation. Valve area (Vmax):   2.44 cm^2. - Mitral valve: Calcified annulus. There was moderate   regurgitation. - Left atrium: The atrium was mildly dilated. - Right ventricle: The cavity size was mildly dilated. Wall   thickness was normal. - Tricuspid valve: There was moderate regurgitation. - Pulmonary arteries: Systolic pressure was mildly increased. PA   peak pressure: 36 mm Hg (S).  Impressions: - Compared to the prior study, there has been no significant   interval change.  _____________   History of Present Illness     [name] is a 84 year old male with history of diastolic heart failure and atrial fibrillation on metoprolol and xarelto that presents from his family medicince physician's office on 09/05/17 with bradycardia and atrial fibrillation with symptoms of volume overload that included shortness of breath and lower extremity edema. Patient states that for the past 2 weeks he has had increasing shortness of breath and leg swelling.  He reports a  recent upper respiratory infection at that time.  He states he takes lasix 80mg  in the morning and 40mg  in the evening without fail.  Reports recent increase in liquid intake since he was sick and was afraid to become dehydrated.    Hospital Course     Consultants: Internal medicine.   The Patient had increased BNP and CXR suggested significant volume overload. His dyspnea was treated for heart failure and bronchodilators for wheezing. PCR flu was negative. Repeat CXR was unremarkable for pneumonia. He was found to have UTI with moderate leukocytosis and given Rocephin. He will be discharged home on Ceftin for 7 days and his previous diuretic.   He had some asymptomatic bradycardia and BB is on hold. He also had hyponatremia with sodium of 129. He was placed on fluid restriction and Na+ 132 today. Will advise 48 ounce fluid restriction. He had acute kidney injury with Baseline SCr 0.8-0.9 up to 1.66, currently 1.49. He was not given lasix. ACE-I is on hold. Will need repeat BMet early next week.   An echocardiogram was unchanged from previous, see report above.   The patient is feeling better and wants to go home.   Patient has been seen by Dr. Percival Spanish today and deemed ready for discharge home. All follow up appointments have been scheduled. Discharge medications are listed below. _____________  Discharge Vitals Blood pressure (!) 141/53, pulse 71, temperature (!) 97.4 F (36.3 C), temperature source Oral, resp. rate (!) 23, height 5\' 10"  (1.778 m), weight 264 lb 3.2 oz (119.8 kg), SpO2 96 %.  Filed Weights   09/06/17 0426 09/07/17 0520  09/08/17 0313  Weight: 264 lb 12.4 oz (120.1 kg) 263 lb 4.8 oz (119.4 kg) 264 lb 3.2 oz (119.8 kg)    Labs & Radiologic Studies    CBC Recent Labs    09/07/17 0950 09/08/17 0815  WBC 13.0* 11.0*  HGB 12.0* 11.8*  HCT 36.2* 35.6*  MCV 85.2 85.0  PLT 126* 416*   Basic Metabolic Panel Recent Labs    09/07/17 0950 09/08/17 0445  NA 131* 132*    K 3.5 3.5  CL 96* 96*  CO2 25 24  GLUCOSE 143* 131*  BUN 43* 40*  CREATININE 1.66* 1.49*  CALCIUM 8.7* 8.6*   Liver Function Tests No results for input(s): AST, ALT, ALKPHOS, BILITOT, PROT, ALBUMIN in the last 72 hours. No results for input(s): LIPASE, AMYLASE in the last 72 hours. Cardiac Enzymes No results for input(s): CKTOTAL, CKMB, CKMBINDEX, TROPONINI in the last 72 hours. BNP Invalid input(s): POCBNP D-Dimer No results for input(s): DDIMER in the last 72 hours. Hemoglobin A1C No results for input(s): HGBA1C in the last 72 hours. Fasting Lipid Panel No results for input(s): CHOL, HDL, LDLCALC, TRIG, CHOLHDL, LDLDIRECT in the last 72 hours. Thyroid Function Tests No results for input(s): TSH, T4TOTAL, T3FREE, THYROIDAB in the last 72 hours.  Invalid input(s): FREET3 _____________  Dg Chest Port 1 View  Result Date: 09/07/2017 CLINICAL DATA:  Shortness of breath. EXAM: PORTABLE CHEST 1 VIEW COMPARISON:  09/05/2017.  06/29/2016. FINDINGS: Cardiomegaly with normal pulmonary vascularity. Low lung volumes with mild basilar atelectasis/infiltrates. Small left pleural effusion. No pneumothorax. IMPRESSION: 1.  Cardiomegaly with normal pulmonary vascularity. 2. Low lung volumes with mild basilar atelectasis/infiltrates. Small left pleural effusion. Electronically Signed   By: Marcello Moores  Register   On: 09/07/2017 10:48   Dg Chest Port 1 View  Result Date: 09/05/2017 CLINICAL DATA:  Increased cough and shortness of breath over the last few days. History of congestive heart failure. EXAM: PORTABLE CHEST 1 VIEW COMPARISON:  06/29/2016 FINDINGS: Chronically enlarged cardiac silhouette. Chronic scarring at the lung bases. No sign of edema or fluid overload. No effusions. No acute bone finding. IMPRESSION: Chronic cardiomegaly and basilar pulmonary scarring. No active process visible. Electronically Signed   By: Nelson Chimes M.D.   On: 09/05/2017 14:09   Disposition   Pt is being discharged  home today in good condition.  Follow-up Plans & Appointments    Follow-up Information    [place] Follow up.   Specialty:  Cardiology Why:  Please go to our [facility] office on March 5th at 2:30 to have 24 hour cardiac monitor placed.  Contact information: [address], [address] [place] [phone]       [facility], [place] Follow up.   Specialties:  Cardiology, Radiology Why:  Cardiology hospital follow up on March 6th at 10:00. [facility] will see you for Dr. Percival Spanish.  Contact information: [address] [place] [address] [phone]        [facility] Follow up.   Specialty:  Cardiology Why:  Please go on Monday or Tuesday between 8 and 4:30 to have labs checked for follow up after your hospitalization. They have the order to check a basic metabolic panel.  Contact information: [address] [place] [phone]         Discharge Instructions    (HEART FAILURE PATIENTS) Call MD:  Anytime you have any of the following symptoms:  1) 3 pound weight gain in 24 hours or 5 pounds in 1 week 2) shortness of breath, with or without a dry hacking cough 3) swelling in the hands, feet or stomach 4) if you have to sleep on extra pillows at night in order to breathe.   Complete by:  As directed    Diet - low sodium heart healthy   Complete by:  As directed    Discharge instructions   Complete by:  As directed    Limit oral fluid intake to 48 ounces per day.   Increase activity slowly   Complete by:  As directed       Discharge Medications   Allergies as of 09/08/2017   No Known Allergies     Medication List    STOP taking these medications   benazepril 40 MG tablet Commonly known as:  LOTENSIN   metoprolol succinate 50 MG 24 hr tablet Commonly known as:  TOPROL-XL     TAKE these medications   acetaminophen 500 MG  tablet Commonly known as:  TYLENOL Take 1,000 mg by mouth every 6 (six) hours as needed for moderate pain.   albuterol 108 (90 Base) MCG/ACT inhaler Commonly known as:  PROVENTIL HFA;VENTOLIN HFA Inhale 2 puffs into the lungs every 6 (six) hours as needed for wheezing or shortness of breath.   amLODipine 10 MG tablet Commonly known as:  NORVASC TAKE 1 TABLET (10 MG TOTAL) BY MOUTH DAILY.   cefUROXime 250 MG tablet Commonly known as:  CEFTIN Take 1 tablet (250 mg total) by mouth 2 (two) times daily with a meal.   cloNIDine 0.1 MG tablet Commonly known as:  CATAPRES TAKE 1 TABLET AT BEDTIME What changed:    how much to take  how to take this  when to take this   doxazosin 4 MG tablet Commonly known as:  CARDURA TAKE 1 TABLET EVERY DAY What changed:    how much to take  how to take this  when to take this   ferrous sulfate 325 (65 FE) MG tablet Take 325 mg by mouth every other day.   fluticasone 50 MCG/ACT nasal spray Commonly known as:  FLONASE Place 1 spray into both nostrils daily. Start taking on:  09/09/2017   furosemide 40 MG tablet Commonly known as:  LASIX Taking 80mg  qam & 40mg  qpm What changed:    how much to take  how to take this  when to take this  additional instructions   guaiFENesin 100 MG/5ML Soln Commonly known as:  ROBITUSSIN Take 5 mLs by mouth every 4 (four) hours as needed for cough or to loosen phlegm.   multivitamin tablet Take 1 tablet by mouth daily.   multivitamin-lutein Caps capsule Take 1 capsule by mouth daily.   oxaprozin 600 MG tablet Commonly known as:  DAYPRO Take 600 mg by mouth daily.   potassium chloride 10 MEQ tablet Commonly known as:  K-DUR TAKE 1 TABLET EVERY DAY What changed:    how much to take  how to take this  when to take this   XARELTO 20 MG Tabs tablet Generic drug:  rivaroxaban TAKE 1 TABLET EVERY DAY WITH SUPPER What changed:  See the new instructions.        Outstanding  Labs/Studies   Repeat BMet early next week. Order placed. Needs 24 hour holter. Arranged.    Duration of Discharge Encounter   Greater than 30 minutes including physician time.  Signed, Daune Perch NP 09/08/2017, 2:31  PM

## 2017-09-08 NOTE — Progress Notes (Signed)
PROGRESS NOTE    Austin Gray.  YSA:[id number] DOB: 1931/10/27 DOA: 09/05/2017 PCP: [name], MD   Brief Narrative:  84 year old male with history of diabetes, atrial fibrillation, bradycardia, chronic diastolic heart failure, hypertension, obesity and GERD who was admitted to the cardiology service for bradycardia.  [facility] hospital[facility] were consulted for dyspnea and urinary tract infection. Assessment & Plan   Acute dyspnea  -Chest x-ray on admission showed cardiomegaly with pulmonary scarring, no active infection is viewed by myself -per cardiology, not CHF exacerbation -Influenza PCR negative, respiratory viral panel pending (although suspected that if something is positive on viral panel, will likely just need symptomatic or supportive care) -Repeat chest x-ray reviewed, small left pleural effusion, unremarkable for pneumonia  -Continue flutter valve, Mucinex -Currently maintaining oxygen saturations in the 90s on room air  Urinary tract infection -Patient reported burning with urination -UA showed many bacteria, moderate leukocytes -Currently patient is afebrile -Urine culture showed greater than 100 K E. Coli, sensitivities are pending -was placed on ceftriaxone, will discharge with Ceftin for 7 days total  Hyponatremia -Sodium mildly improved, currently 132  -repeat BMP in 1 week  Acute kidney injury -baseline creatinine in 2018, 0.8-0.9 -Currently 1.49 -Would repeat BMP in 1 week  DVT Prophylaxis  Xarelto  Code Status: Full  Family Communication: None at bedside  Disposition Plan: Admitted, per cardiology  Consultants [facility]  Antibiotics   Anti-infectives (From admission, onward)   Start     Dose/Rate Route Frequency Ordered Stop   09/07/17 1200  cefTRIAXone (ROCEPHIN) 1 g in sodium chloride 0.9 % 100 mL IVPB     1 g 200 mL/hr over 30 Minutes Intravenous Every 24 hours 09/07/17 1058        Subjective:   Austin Gray seen and examined  today.  Patient feeling mildly better today.  Feels his breathing is "okay".  Continues to have productive cough. Denies current chest pain, abdominal pain, nausea vomiting, diarrhea or constipation.  Would like to go home.    Objective:   Vitals:   09/08/17 1033 09/08/17 1103 09/08/17 1111 09/08/17 1143  BP: (!) 145/46   (!) 141/53  Pulse:   88 (!) 54  Resp:      Temp:    (!) 97.4 F (36.3 C)  TempSrc:    Oral  SpO2:  100% 99%   Weight:      Height:        Intake/Output Summary (Last 24 hours) at 09/08/2017 1324 Last data filed at 09/08/2017 1000 Gross per 24 hour  Intake 800 ml  Output 850 ml  Net -50 ml   Filed Weights   09/06/17 0426 09/07/17 0520 09/08/17 0313  Weight: 120.1 kg (264 lb 12.4 oz) 119.4 kg (263 lb 4.8 oz) 119.8 kg (264 lb 3.2 oz)    Exam  General: Well developed, well nourished, NAD, appears stated age  HEENT: NCAT, mucous membranes moist.   Cardiovascular: S1 S2 auscultated, irregular, no murmur  Respiratory: Clear to auscultation bilaterally with equal chest rise, productive cough  Abdomen: Soft, obese, nontender, nondistended, + bowel sounds  Extremities: warm dry without cyanosis clubbing. LE edema.   Neuro: AAOx3, nonfocal  Psych: Normal affect and demeanor   Data Reviewed: I have personally reviewed following labs and imaging studies  CBC: Recent Labs  Lab 09/05/17 1335 09/05/17 1507 09/07/17 0950 09/08/17 0815  WBC 12.9*  --  13.0* 11.0*  NEUTROABS 11.2*  --   --   --  HGB 12.0* 12.2* 12.0* 11.8*  HCT 34.6* 36.0* 36.2* 35.6*  MCV 83.8  --  85.2 85.0  PLT 97*  --  126* 366*   Basic Metabolic Panel: Recent Labs  Lab 09/05/17 1507 09/06/17 0456 09/07/17 0950 09/08/17 0445  NA 129* 129* 131* 132*  K 3.6 4.7 3.5 3.5  CL 93* 95* 96* 96*  CO2  --  20* 25 24  GLUCOSE 127* 108* 143* 131*  BUN 36* 43* 43* 40*  CREATININE 1.50* 1.57* 1.66* 1.49*  CALCIUM  --  8.5* 8.7* 8.6*   GFR: Estimated Creatinine Clearance: 48.7  mL/min (A) (by C-G formula based on SCr of 1.49 mg/dL (H)). Liver Function Tests: Recent Labs  Lab 09/05/17 1335  AST 44*  ALT 29  ALKPHOS 72  BILITOT 1.3*  PROT 5.9*  ALBUMIN 3.2*   No results for input(s): LIPASE, AMYLASE in the last 168 hours. No results for input(s): AMMONIA in the last 168 hours. Coagulation Profile: No results for input(s): INR, PROTIME in the last 168 hours. Cardiac Enzymes: No results for input(s): CKTOTAL, CKMB, CKMBINDEX, TROPONINI in the last 168 hours. BNP (last 3 results) No results for input(s): PROBNP in the last 8760 hours. HbA1C: No results for input(s): HGBA1C in the last 72 hours. CBG: No results for input(s): GLUCAP in the last 168 hours. Lipid Profile: No results for input(s): CHOL, HDL, LDLCALC, TRIG, CHOLHDL, LDLDIRECT in the last 72 hours. Thyroid Function Tests: No results for input(s): TSH, T4TOTAL, FREET4, T3FREE, THYROIDAB in the last 72 hours. Anemia Panel: No results for input(s): VITAMINB12, FOLATE, FERRITIN, TIBC, IRON, RETICCTPCT in the last 72 hours. Urine analysis:    Component Value Date/Time   COLORURINE YELLOW 09/06/2017 2150   APPEARANCEUR TURBID (A) 09/06/2017 2150   LABSPEC 1.014 09/06/2017 2150   PHURINE 5.0 09/06/2017 2150   GLUCOSEU NEGATIVE 09/06/2017 2150   HGBUR LARGE (A) 09/06/2017 2150   BILIRUBINUR NEGATIVE 09/06/2017 2150   KETONESUR NEGATIVE 09/06/2017 2150   PROTEINUR 100 (A) 09/06/2017 2150   UROBILINOGEN 0.2 10/04/2012 1140   NITRITE NEGATIVE 09/06/2017 2150   LEUKOCYTESUR MODERATE (A) 09/06/2017 2150   Sepsis Labs: @LABRCNTIP (procalcitonin:4,lacticidven:4)  ) Recent Results (from the past 240 hour(s))  MRSA PCR Screening     Status: None   Collection Time: 09/05/17  8:54 PM  Result Value Ref Range Status   MRSA by PCR NEGATIVE NEGATIVE Final    Comment:        The GeneXpert MRSA Assay (FDA approved for NASAL specimens only), is one component of a comprehensive MRSA  colonization surveillance program. It is not intended to diagnose MRSA infection nor to guide or monitor treatment for MRSA infections. Performed at [facility], [place] [address], [place], [place] [address]   Culture, Urine     Status: Abnormal (Preliminary result)   Collection Time: 09/07/17  2:55 AM  Result Value Ref Range Status   Specimen Description URINE, CLEAN CATCH  Final   Special Requests   Final    NONE Performed at [facility], [place] [address] [place], [place] [address]    Culture >=100,000 COLONIES/mL ESCHERICHIA COLI (A)  Final   Report Status PENDING  Incomplete      Radiology Studies: Dg Chest Port 1 View  Result Date: 09/07/2017 CLINICAL DATA:  Shortness of breath. EXAM: PORTABLE CHEST 1 VIEW COMPARISON:  09/05/2017.  06/29/2016. FINDINGS: Cardiomegaly with normal pulmonary vascularity. Low lung volumes with mild basilar atelectasis/infiltrates. Small left pleural effusion. No  pneumothorax. IMPRESSION: 1.  Cardiomegaly with normal pulmonary vascularity. 2. Low lung volumes with mild basilar atelectasis/infiltrates. Small left pleural effusion. Electronically Signed   By: [place]   On: 09/07/2017 10:48     Scheduled Meds: . amLODipine  10 mg Oral Daily  . cloNIDine  0.1 mg Oral QHS  . dextromethorphan-guaiFENesin  2 tablet Oral BID  . doxazosin  4 mg Oral Daily  . fluticasone  1 spray Each Nare Daily  . furosemide  40 mg Intravenous Once  . ipratropium-albuterol  3 mL Nebulization QID  . nicotine  7 mg Transdermal Daily  . rivaroxaban  20 mg Oral Q supper   Continuous Infusions: . sodium chloride    . cefTRIAXone (ROCEPHIN)  IV 1 g (09/08/17 1315)     LOS: 3 days   Time Spent in minutes   30 minutes  Debbera Wolken D.O. on 09/08/2017 at 1:24 PM  Between 7am to 7pm - Pager - [phone]  After 7pm go to [email or web address] - password [email or web address]  And look for the night coverage person covering for me after hours  [facility]  Group Office  [phone]

## 2017-09-08 NOTE — Progress Notes (Signed)
The patient's oxygen saturation at rest on room ranged from 99 to 91; lower while coughing. I will continue to monitor the patient closely.  Saddie Benders RN

## 2017-09-08 NOTE — Progress Notes (Signed)
SATURATION QUALIFICATIONS: (This note is used to comply with regulatory documentation for home oxygen)  Patient Saturations on Room Air at Rest = 93%  Patient Saturations on Room Air while Ambulating = 97%  Patient Saturations on N/A Liters of oxygen while Ambulating = N/A%  Please briefly explain why patient needs home oxygen: The patient did not require oxygen at this time.   Saddie Benders RN

## 2017-09-08 NOTE — Consult Note (Signed)
           [facility] CM Primary Care Navigator  09/08/2017  Shantanu Strauch. 08/04/1931 [id number]   Went to seepatient at the bedside to identify possible discharge needsbuthe was already discharged per staff report. Patient was discharged home today.  Per MD note, patientwas seen at his primary care provider's office on 2/19for bradycardia and atrial fibrillation with symptoms of volume overload that included shortness of breath and lower extremity edema. His dyspnea was treated for heart failure and bronchodilators for wheezing.  Primary care provider's office is listed as providing transition of care (TOC).  Patient has discharge instruction to follow-up with cardiology for lab works next week and follow-up appointment on 09/19/17.  Primary care provider's office called(Dr. Cletus Gash Pickard)to notify of patient's discharge andneed for post hospital follow-up and transition of care (TOC). Notified of patient's health issues needing follow-up (mainly HF). Made aware to refer patient to [facility] CM if deemed necessary and appropriate for services.   For questions, please contact:  Dannielle Huh, BSN, RN- [facility] Primary Care Navigator  Telephone: [phone] [place]

## 2017-09-08 NOTE — Progress Notes (Signed)
Progress Note  Patient Name: Austin Gray. Date of Encounter: 09/08/2017  Primary Cardiologist:   No primary care provider on file.   Subjective   Breathing OK.  Denies any pain.  Wants to go home.   Inpatient Medications    Scheduled Meds: . amLODipine  10 mg Oral Daily  . cloNIDine  0.1 mg Oral QHS  . dextromethorphan-guaiFENesin  2 tablet Oral BID  . doxazosin  4 mg Oral Daily  . fluticasone  1 spray Each Nare Daily  . furosemide  40 mg Intravenous Once  . ipratropium-albuterol  3 mL Nebulization QID  . nicotine  7 mg Transdermal Daily  . rivaroxaban  20 mg Oral Q supper   Continuous Infusions: . sodium chloride    . cefTRIAXone (ROCEPHIN)  IV Stopped (09/07/17 1448)   PRN Meds: acetaminophen **OR** acetaminophen, bisacodyl, ipratropium-albuterol, naphazoline-glycerin   Vital Signs    Vitals:   09/08/17 1033 09/08/17 1103 09/08/17 1111 09/08/17 1143  BP: (!) 145/46   (!) 141/53  Pulse:   88 (!) 54  Resp:      Temp:    (!) 97.4 F (36.3 C)  TempSrc:    Oral  SpO2:  100% 99%   Weight:      Height:        Intake/Output Summary (Last 24 hours) at 09/08/2017 1304 Last data filed at 09/08/2017 1000 Gross per 24 hour  Intake 800 ml  Output 850 ml  Net -50 ml   Filed Weights   09/06/17 0426 09/07/17 0520 09/08/17 0313  Weight: 264 lb 12.4 oz (120.1 kg) 263 lb 4.8 oz (119.4 kg) 264 lb 3.2 oz (119.8 kg)    Telemetry    Atrial fib with slow rate but no sustained pauses.  PVCs.  Rate still slow but no significant pauses - Personally Reviewed  ECG    NA - Personally Reviewed  Physical Exam   GEN: No  acute distress.   Neck: No  JVD Cardiac: Irregular RR, no murmurs, rubs, or gallops.  Respiratory: Clear  to auscultation bilaterally. GI: Soft, nontender, non-distended, normal bowel sounds  MS:  Mild edema; No deformity. Neuro:   Nonfocal  Psych: Oriented and appropriate    Labs    Chemistry Recent Labs  Lab 09/05/17 1335   09/06/17 0456 09/07/17 0950 09/08/17 0445  NA  --    < > 129* 131* 132*  K  --    < > 4.7 3.5 3.5  CL  --    < > 95* 96* 96*  CO2  --   --  20* 25 24  GLUCOSE  --    < > 108* 143* 131*  BUN  --    < > 43* 43* 40*  CREATININE  --    < > 1.57* 1.66* 1.49*  CALCIUM  --   --  8.5* 8.7* 8.6*  PROT 5.9*  --   --   --   --   ALBUMIN 3.2*  --   --   --   --   AST 44*  --   --   --   --   ALT 29  --   --   --   --   ALKPHOS 72  --   --   --   --   BILITOT 1.3*  --   --   --   --   GFRNONAA  --   --  39* 37* 42*  GFRAA  --   --  45* 42* 48*  ANIONGAP  --   --  14 10 12    < > = values in this interval not displayed.     Hematology Recent Labs  Lab 09/05/17 1335 09/05/17 1507 09/07/17 0950 09/08/17 0815  WBC 12.9*  --  13.0* 11.0*  RBC 4.13*  --  4.25 4.19*  HGB 12.0* 12.2* 12.0* 11.8*  HCT 34.6* 36.0* 36.2* 35.6*  MCV 83.8  --  85.2 85.0  MCH 29.1  --  28.2 28.2  MCHC 34.7  --  33.1 33.1  RDW 14.6  --  14.8 15.0  PLT 97*  --  126* 124*    Cardiac EnzymesNo results for input(s): TROPONINI in the last 168 hours.  Recent Labs  Lab 09/05/17 1354  TROPIPOC 0.01     BNP Recent Labs  Lab 09/05/17 1335  BNP 260.7*     DDimer No results for input(s): DDIMER in the last 168 hours.   Radiology    Dg Chest Port 1 View  Result Date: 09/07/2017 CLINICAL DATA:  Shortness of breath. EXAM: PORTABLE CHEST 1 VIEW COMPARISON:  09/05/2017.  06/29/2016. FINDINGS: Cardiomegaly with normal pulmonary vascularity. Low lung volumes with mild basilar atelectasis/infiltrates. Small left pleural effusion. No pneumothorax. IMPRESSION: 1.  Cardiomegaly with normal pulmonary vascularity. 2. Low lung volumes with mild basilar atelectasis/infiltrates. Small left pleural effusion. Electronically Signed   By: Marcello Moores  Register   On: 09/07/2017 10:48    Cardiac Studies   ECHO:    Study Conclusions  - Left ventricle: The cavity size was normal. Wall thickness was   increased in a pattern of  mild LVH. Systolic function was normal.   The estimated ejection fraction was in the range of 55% to 60%.   Wall motion was normal; there were no regional wall motion   abnormalities. - Ventricular septum: The contour showed diastolic flattening. - Aortic valve: Trileaflet; mildly thickened, mildly calcified   leaflets. There was trivial regurgitation. Valve area (Vmax):   2.44 cm^2. - Mitral valve: Calcified annulus. There was moderate   regurgitation. - Left atrium: The atrium was mildly dilated. - Right ventricle: The cavity size was mildly dilated. Wall   thickness was normal. - Tricuspid valve: There was moderate regurgitation. - Pulmonary arteries: Systolic pressure was mildly increased. PA   peak pressure: 36 mm Hg (S).  Impressions:  - Compared to the prior study, there has been no significant   interval change.  Patient Profile     84 y.o. male with history of diastolic heart failure and atrial fibrillation on metoprolol and xarelto that presented from his family medicince [facility] office with bradycardia and atrial fibrillation with symptoms of volume overload that included shortness of breath and lower extremity edema.   Assessment & Plan    ACUTE DYSPNEA:    EF is unchanged from previous.  Continuing bronchodilators.   I appreciate the hospital service input.  CXR without acute findings.  OK to go home with previous diuretic  BRADYCARDIA:  Holding beta blocker.  No indication for pacing.  Needs a Holter 24 hours in one week.    AKI:    Creat is improved.  Hold ACE inhibitor on discharge.   Check BMET early next week with results to me.    HYPONATREMIA:    Na improved.   Needs 48 ounce salt restriction.   UTI:   On Rocephin.   Discuss duration of treatment with primary MD.    Follow up TOC appt within two  weeks.    For questions or updates, please contact [place] Please consult [email or web address] for contact info under Cardiology/STEMI.   Signed, Minus Breeding, MD  09/08/2017, 1:04 PM

## 2017-09-09 LAB — URINE CULTURE

## 2017-09-11 ENCOUNTER — Telehealth: Payer: Self-pay | Admitting: *Deleted

## 2017-09-11 NOTE — Telephone Encounter (Signed)
-----   Message from Daune Perch, NP sent at 09/08/2017  5:09 PM EST ----- Regarding: TOC phone call This pt has a TOC follow up on 3/6 with [facility]. Discharged today.  He will need a TOC phone call please.   Pecolia Ades, NP

## 2017-09-11 NOTE — Telephone Encounter (Signed)
Patient contacted regarding discharge from [facility] on 09/08/17.  Patient understands to follow up with provider meng on 09-20-17 at 10 am at [facility] Patient understands discharge instructions? yes Patient understands medications and regiment? yes Patient understands to bring all medications to this visit? yes

## 2017-09-15 ENCOUNTER — Encounter: Payer: Self-pay | Admitting: Family Medicine

## 2017-09-15 ENCOUNTER — Inpatient Hospital Stay: Payer: Medicare HMO | Admitting: Family Medicine

## 2017-09-15 ENCOUNTER — Ambulatory Visit (INDEPENDENT_AMBULATORY_CARE_PROVIDER_SITE_OTHER): Payer: Medicare HMO | Admitting: Family Medicine

## 2017-09-15 VITALS — BP 130/78 | HR 98 | Temp 97.6°F | Resp 20 | Ht 68.0 in | Wt 252.0 lb

## 2017-09-15 DIAGNOSIS — E871 Hypo-osmolality and hyponatremia: Secondary | ICD-10-CM

## 2017-09-15 DIAGNOSIS — I482 Chronic atrial fibrillation, unspecified: Secondary | ICD-10-CM

## 2017-09-15 DIAGNOSIS — Z09 Encounter for follow-up examination after completed treatment for conditions other than malignant neoplasm: Secondary | ICD-10-CM

## 2017-09-15 DIAGNOSIS — N390 Urinary tract infection, site not specified: Secondary | ICD-10-CM

## 2017-09-15 DIAGNOSIS — R001 Bradycardia, unspecified: Secondary | ICD-10-CM

## 2017-09-15 DIAGNOSIS — N289 Disorder of kidney and ureter, unspecified: Secondary | ICD-10-CM

## 2017-09-15 LAB — CBC WITH DIFFERENTIAL/PLATELET
BASOS PCT: 0.6 %
Basophils Absolute: 54 cells/uL (ref 0–200)
EOS ABS: 81 {cells}/uL (ref 15–500)
EOS PCT: 0.9 %
HCT: 37.7 % — ABNORMAL LOW (ref 38.5–50.0)
Hemoglobin: 12.7 g/dL — ABNORMAL LOW (ref 13.2–17.1)
Lymphs Abs: 1791 cells/uL (ref 850–3900)
MCH: 28.3 pg (ref 27.0–33.0)
MCHC: 33.7 g/dL (ref 32.0–36.0)
MCV: 84.2 fL (ref 80.0–100.0)
MONOS PCT: 8.7 %
MPV: 10.8 fL (ref 7.5–12.5)
NEUTROS ABS: 6291 {cells}/uL (ref 1500–7800)
Neutrophils Relative %: 69.9 %
Platelets: 132 10*3/uL — ABNORMAL LOW (ref 140–400)
RBC: 4.48 10*6/uL (ref 4.20–5.80)
RDW: 13.7 % (ref 11.0–15.0)
Total Lymphocyte: 19.9 %
WBC mixed population: 783 cells/uL (ref 200–950)
WBC: 9 10*3/uL (ref 3.8–10.8)

## 2017-09-15 LAB — COMPLETE METABOLIC PANEL WITH GFR
AG Ratio: 1.5 (calc) (ref 1.0–2.5)
ALT: 24 U/L (ref 9–46)
AST: 39 U/L — ABNORMAL HIGH (ref 10–35)
Albumin: 3.8 g/dL (ref 3.6–5.1)
Alkaline phosphatase (APISO): 77 U/L (ref 40–115)
BUN: 13 mg/dL (ref 7–25)
CALCIUM: 9.5 mg/dL (ref 8.6–10.3)
CO2: 31 mmol/L (ref 20–32)
CREATININE: 0.88 mg/dL (ref 0.70–1.11)
Chloride: 100 mmol/L (ref 98–110)
GFR, EST AFRICAN AMERICAN: 92 mL/min/{1.73_m2} (ref >=60)
GFR, Est Non African American: 79 mL/min/{1.73_m2} (ref >=60)
GLUCOSE: 93 mg/dL (ref 65–99)
Globulin: 2.6 g/dL (calc) (ref 1.9–3.7)
Potassium: 3.6 mmol/L (ref 3.5–5.3)
Sodium: 139 mmol/L (ref 135–146)
TOTAL PROTEIN: 6.4 g/dL (ref 6.1–8.1)
Total Bilirubin: 1.5 mg/dL — ABNORMAL HIGH (ref 0.2–1.2)

## 2017-09-15 NOTE — Progress Notes (Signed)
Subjective:    Patient ID: Austin Mesi., male    DOB: 27-Apr-1932, 84 y.o.   MRN: [id number]  HPI  09/05/17 Patient's symptoms started 2 weeks ago with a runny nose and a nonproductive cough.  He presents today complaining of shortness of breath.  Patient is in atrial fibrillation.  His heart rate is extremely  low.  He reports dyspnea on exertion.  He reports orthopnea.  He states that he gets strangled when he lays down.  His weight is up 14 lbs since his last office visit.  He has pitting edema in both legs +1.  At that time, my plan was: Patient has shortness of breath with profound bradycardia and atrial fibrillation.  He appears to be on too much medication.  However I do not feel that the patient is safe to go home with a heart rate of 32 bpm.  Therefore I recommended emergency room evaluation due to shortness of breath what appears to be fluid overload on exam, and symptomatic bradycardia.  Patient was transported via EMS  Patient was admitted to the hospital and I have copied relevant portions of the discharge summary below:  Admit date: 09/05/2017 Discharge date: 09/08/2017  Primary Care Provider: Jenna Luo T Primary Cardiologist: Minus Breeding, MD  Discharge Diagnoses    Principal Problem:   Dyspnea Active Problems:   Bradycardia   Urinary tract infection   Hyponatremia   Allergies No Known Allergies  Diagnostic Studies/Procedures    Echocardiogram 09/06/2017 Study Conclusions - Left ventricle: The cavity size was normal. Wall thickness was increased in a pattern of mild LVH. Systolic function was normal. The estimated ejection fraction was in the range of 55% to 60%. Wall motion was normal; there were no regional wall motion abnormalities. - Ventricular septum: The contour showed diastolic flattening. - Aortic valve: Trileaflet; mildly thickened, mildly calcified leaflets. There was trivial regurgitation. Valve area (Vmax): 2.44  cm^2. - Mitral valve: Calcified annulus. There was moderate regurgitation. - Left atrium: The atrium was mildly dilated. - Right ventricle: The cavity size was mildly dilated. Wall thickness was normal. - Tricuspid valve: There was moderate regurgitation. - Pulmonary arteries: Systolic pressure was mildly increased. PA peak pressure: 36 mm Hg (S).  Impressions: - Compared to the prior study, there has been no significant interval change.  _____________   History of Present Illness     Austin Gray is a 84 year old male with history of diastolic heart failure and atrial fibrillation onmetoprolol andxarelto that presents from his family medicince physician's office on 09/05/17 with bradycardia and atrial fibrillation with symptoms of volume overload that included shortness of breath and lower extremity edema. Patient states that for the past 2 weeks he has had increasing shortness of breath and leg swelling. He reports a recent upper respiratory infectionat that time. He states he takes lasix 80mg  in the morning and 40mg  in the evening without fail.Reports recent increase in liquid intake since he was sick and was afraid to become dehydrated.  Hospital Course     Consultants: Internal medicine.   The Patient had increased BNP and CXR suggested significant volume overload. His dyspnea was treated for heart failure and bronchodilators for wheezing. PCR flu was negative. Repeat CXR was unremarkable for pneumonia. He was found to have UTI with moderate leukocytosis and given Rocephin. He will be discharged home on Ceftin for 7 days and his previous diuretic.   He had some asymptomatic bradycardia and BB is on hold. He  also had hyponatremia with sodium of 129. He was placed on fluid restriction and Na+ 132 today. Will advise 48 ounce fluid restriction. He had acute kidney injury with Baseline SCr 0.8-0.9 up to 1.66, currently 1.49. He was not given lasix. ACE-I is on hold.  Will need repeat BMet early next week.   An echocardiogram was unchanged from previous, see report above.   The patient is feeling better and wants to go home.   Patient has been seen by Dr. Percival Spanish today and deemed ready for discharge home. All follow up appointments have been scheduled. Discharge medications are listed below.   09/15/17 Patient is here today for hospital follow-up.  Patient feels and looks much better.  He has diuresed and lost almost 18 lbs since last visit.  His lungs are much clearer today.  He denies sob or orthopnea.  He still has edema in his legs but this is improving.  Heartrate is elevated 90-98 bpm off of metoprolol.  He denies chest pain or palpitations.  He denies dysuria or frequency.  He is restircting fluid to 62 oz per day for hyponatremia.    Past Medical History:  Diagnosis Date  . Anxiety   . Arthritis    "little bit; left knee"  . Atrial fibrillation (HCC)    Chronic  . Basal cell carcinoma   . Cataracts, bilateral    to have left eye cataract surgery on 03/21/17   . CHF (congestive heart failure), NYHA class II, chronic, diastolic ([place])   . Colon polyps    last colonoscopy 2015 (tubular adenoma)  . Depression   . Diverticulosis   . Gastritis   . GERD (gastroesophageal reflux disease)   . H/O: GI bleed    Recurrent  . Hemorrhoids   . History of blood transfusion   . History of BPH   . History of kidney stones    hx of x 1   . Hypertension   . Pernicious anemia   . PVC (premature ventricular contraction)   . Rectal bleeding 02/15/12  . Visual impairment   . Vitamin D deficiency    Past Surgical History:  Procedure Laterality Date  . COLONOSCOPY WITH PROPOFOL N/A 03/17/2017   Procedure: COLONOSCOPY WITH PROPOFOL;  Surgeon: Carol Ada, MD;  Location: [facility] ENDOSCOPY;  Service: Endoscopy;  Laterality: N/A;  . [employer]; ~ 2002   left; right   Current Outpatient Medications on File Prior to Visit  Medication Sig  Dispense Refill  . acetaminophen (TYLENOL) 500 MG tablet Take 1,000 mg by mouth every 6 (six) hours as needed for moderate pain.    Marland Kitchen albuterol (PROVENTIL HFA;VENTOLIN HFA) 108 (90 Base) MCG/ACT inhaler Inhale 2 puffs into the lungs every 6 (six) hours as needed for wheezing or shortness of breath. 1 Inhaler 0  . amLODipine (NORVASC) 10 MG tablet TAKE 1 TABLET (10 MG TOTAL) BY MOUTH DAILY. 90 tablet 3  . cefUROXime (CEFTIN) 250 MG tablet Take 1 tablet (250 mg total) by mouth 2 (two) times daily with a meal. 10 tablet 0  . cloNIDine (CATAPRES) 0.1 MG tablet TAKE 1 TABLET AT BEDTIME (Patient taking differently: 0.1mg  by mouth at bedtime) 90 tablet 3  . doxazosin (CARDURA) 4 MG tablet TAKE 1 TABLET EVERY DAY (Patient taking differently: 4mg  by mouth once daily) 90 tablet 3  . ferrous sulfate 325 (65 FE) MG tablet Take 325 mg by mouth every other day.     . fluticasone (FLONASE) 50 MCG/ACT  nasal spray Place 1 spray into both nostrils daily. 16 g 1  . furosemide (LASIX) 40 MG tablet Taking 80mg  qam & 40mg  qpm (Patient taking differently: Take 40-80 mg by mouth See admin instructions. 80mg  in am, 40mg  in pm) 90 tablet 1  . guaiFENesin (ROBITUSSIN) 100 MG/5ML SOLN Take 5 mLs by mouth every 4 (four) hours as needed for cough or to loosen phlegm.    . Multiple Vitamin (MULTIVITAMIN) tablet Take 1 tablet by mouth daily.    . multivitamin-lutein (OCUVITE-LUTEIN) CAPS Take 1 capsule by mouth daily.    Marland Kitchen oxaprozin (DAYPRO) 600 MG tablet Take 600 mg by mouth daily.    . potassium chloride (K-DUR) 10 MEQ tablet TAKE 1 TABLET EVERY DAY (Patient taking differently: 68meq by mouth once daily) 90 tablet 3  . XARELTO 20 MG TABS tablet TAKE 1 TABLET EVERY DAY WITH SUPPER (Patient taking differently: 20mg  by mouth every evening) 90 tablet 3   Current Facility-Administered Medications on File Prior to Visit  Medication Dose Route Frequency Provider Last Rate Last Dose  . cyanocobalamin ((VITAMIN B-12)) injection 1,000  mcg  1,000 mcg Intramuscular Q30 days [name], MD   1,000 mcg at 07/25/17 1030   No Known Allergies Social History   Socioeconomic History  . Marital status: Widowed    Spouse name: Not on file  . Number of children: 1  . Years of education: Not on file  . Highest education level: Not on file  Social Needs  . Financial resource strain: Not on file  . Food insecurity - worry: Not on file  . Food insecurity - inability: Not on file  . Transportation needs - medical: Not on file  . Transportation needs - non-medical: Not on file  Occupational History  . Occupation: Retired  Tobacco Use  . Smoking status: Never Smoker  . Smokeless tobacco: Current User    Types: Snuff  . Tobacco comment: 02/15/12 "still use snuff @ times; not daily"  Substance and Sexual Activity  . Alcohol use: No  . Drug use: No  . Sexual activity: No    Comment: widower, lives with granddaughter and great grandchildren.  Other Topics Concern  . Not on file  Social History Narrative   Lives with grand daughter.         Epworth Sleepiness Scale = 7 (as of 09/21/2015)      Review of Systems  All other systems reviewed and are negative.      Objective:   Physical Exam  Constitutional: He appears well-developed and well-nourished.  Non-toxic appearance. He has a sickly appearance. He does not appear ill.  HENT:  Right Ear: External ear normal.  Left Ear: External ear normal.  Nose: Nose normal.  Mouth/Throat: Oropharynx is clear and moist.  Neck: Neck supple. No JVD present.  Cardiovascular: An irregularly irregular rhythm present. Bradycardia present.  Pulmonary/Chest: Effort normal. No respiratory distress. He has no wheezes. He has no rales.  Abdominal: Soft. Bowel sounds are normal. He exhibits distension.  Musculoskeletal: He exhibits edema.  Lymphadenopathy:    He has no cervical adenopathy.   Wt Readings from Last 3 Encounters:  09/15/17 252 lb (114.3 kg)  09/08/17 264 lb 3.2 oz  (119.8 kg)  09/05/17 270 lb (122.5 kg)               Assessment & Plan:  Chronic atrial fibrillation (HCC)  Bradycardia  Urinary tract infection without hematuria, site unspecified  Hospital discharge follow-up  Patient  had acute renal insufficiency in the hospital.  However his exam was more consistent with fluid overload.  I believe the acute renal insufficiency was due to the urinary tract infection rather than dehydration.  I will repeat a CMP today to monitor renal function.  I will also repeat a urine culture to ensure resolution of the urinary tract infection.  I will repeat a CMP today to monitor his hyponatremia.  This was treated with fluid restriction in the hospital.  Patient is holding his beta-blocker and his bradycardia has improved.  I believe his bradycardia was a combination of the beta-blocker coupled with his acute illness.  I believe the patient was possibly developing urosepsis.  Clinically he appears much better today.  Recheck heart rate next week and resume metoprolol if persistently > 90.  There is no evidence of fluid overload on exam.  His bradycardia has improved.  His lungs are clear.  He denies any chest pain or shortness of breath.  Therefore I will not make any changes in his medication today.  Repeat urine cx.

## 2017-09-16 LAB — URINE CULTURE
MICRO NUMBER: 90268347
SPECIMEN QUALITY: ADEQUATE

## 2017-09-19 ENCOUNTER — Ambulatory Visit (INDEPENDENT_AMBULATORY_CARE_PROVIDER_SITE_OTHER): Payer: Medicare HMO

## 2017-09-19 DIAGNOSIS — I482 Chronic atrial fibrillation, unspecified: Secondary | ICD-10-CM

## 2017-09-20 ENCOUNTER — Ambulatory Visit: Payer: Medicare HMO | Admitting: Physician Assistant

## 2017-09-20 ENCOUNTER — Encounter: Payer: Self-pay | Admitting: Physician Assistant

## 2017-09-20 VITALS — BP 132/82 | HR 103 | Ht 68.0 in | Wt 246.0 lb

## 2017-09-20 DIAGNOSIS — I482 Chronic atrial fibrillation, unspecified: Secondary | ICD-10-CM

## 2017-09-20 DIAGNOSIS — I5032 Chronic diastolic (congestive) heart failure: Secondary | ICD-10-CM | POA: Diagnosis not present

## 2017-09-20 DIAGNOSIS — I10 Essential (primary) hypertension: Secondary | ICD-10-CM

## 2017-09-20 DIAGNOSIS — R600 Localized edema: Secondary | ICD-10-CM

## 2017-09-20 DIAGNOSIS — I340 Nonrheumatic mitral (valve) insufficiency: Secondary | ICD-10-CM

## 2017-09-20 NOTE — Patient Instructions (Signed)
Continue same medications     Your physician recommends that you schedule a follow-up appointment in: 2 months with Dr.Hochrein

## 2017-09-20 NOTE — Progress Notes (Signed)
Cardiology Office Note    Date:  09/20/2017   ID:  Austin Mesi., DOB 1932-04-08, MRN [id number]  PCP:  [name], MD  Cardiologist:  Dr. Percival Spanish  Chief Complaint  Patient presents with  . Follow-up    seen for Dr. Percival Spanish    History of Present Illness:  Austin Gray. is a 84 y.o. male with PMH of chronic diastolic heart failure, chronic atrial fibrillation on metoprolol and Xarelto, recurrent GI bleed and hypertension.  He was most recently admitted on 09/05/2017 after he presented to his PCPs office but bradycardia and symptoms of volume overload.  Initial lab work also suggested acute kidney injury with creatinine elevation up to 1.5.  His heart rate was in the 30s in the ER.  His beta-blocker was held during the admission.  His ACE inhibitor was discontinued between the most recent admission.  Echocardiogram obtained on 09/06/2017 showed mild LVH, EF 55-60%, moderate MR, moderate TR, PA peak pressure 36 mmHg.  Since his discharge, repeat lab work shows normalization of the renal function by 09/15/2017.  Patient presents today for cardiology office visit.  His weight has dropped continuously from 264 pounds on the day of discharge to 242 pounds this morning based on home scale, despite the significant weight loss, renal function has been stable.  His heart rate is running a little fast today, however sounds regular with occasional skipped beat, EKG showed afib with HR 97.  He will return to the surgery office this afternoon to return his 24 hr heart monitor.  I would not recommend to restart the previous 50 mg Toprol-XL, however if his heart rate is persistently elevated, I would recommend short acting metoprolol tartrate 12.5 mg twice daily as a potential rate control therapy.   Past Medical History:  Diagnosis Date  . Anxiety   . Arthritis    "little bit; left knee"  . Atrial fibrillation (HCC)    Chronic  . Basal cell carcinoma   . Cataracts, bilateral    to  have left eye cataract surgery on 03/21/17   . CHF (congestive heart failure), NYHA class II, chronic, diastolic ([place])   . Colon polyps    last colonoscopy 2015 (tubular adenoma)  . Depression   . Diverticulosis   . Gastritis   . GERD (gastroesophageal reflux disease)   . H/O: GI bleed    Recurrent  . Hemorrhoids   . History of blood transfusion   . History of BPH   . History of kidney stones    hx of x 1   . Hypertension   . Pernicious anemia   . PVC (premature ventricular contraction)   . Rectal bleeding 02/15/12  . Visual impairment   . Vitamin D deficiency     Past Surgical History:  Procedure Laterality Date  . COLONOSCOPY WITH PROPOFOL N/A 03/17/2017   Procedure: COLONOSCOPY WITH PROPOFOL;  Surgeon: Carol Ada, MD;  Location: [facility];  Service: Endoscopy;  Laterality: N/A;  . [place]; ~ 2002   left; right    Current Medications: Outpatient Medications Prior to Visit  Medication Sig Dispense Refill  . acetaminophen (TYLENOL) 500 MG tablet Take 1,000 mg by mouth every 6 (six) hours as needed for moderate pain.    Marland Kitchen albuterol (PROVENTIL HFA;VENTOLIN HFA) 108 (90 Base) MCG/ACT inhaler Inhale 2 puffs into the lungs every 6 (six) hours as needed for wheezing or shortness of breath. 1 Inhaler 0  . amLODipine (  NORVASC) 10 MG tablet TAKE 1 TABLET (10 MG TOTAL) BY MOUTH DAILY. 90 tablet 3  . cefUROXime (CEFTIN) 250 MG tablet Take 1 tablet (250 mg total) by mouth 2 (two) times daily with a meal. 10 tablet 0  . cloNIDine (CATAPRES) 0.1 MG tablet TAKE 1 TABLET AT BEDTIME (Patient taking differently: 0.1mg  by mouth at bedtime) 90 tablet 3  . doxazosin (CARDURA) 4 MG tablet TAKE 1 TABLET EVERY DAY (Patient taking differently: 4mg  by mouth once daily) 90 tablet 3  . ferrous sulfate 325 (65 FE) MG tablet Take 325 mg by mouth every other day.     . fluticasone (FLONASE) 50 MCG/ACT nasal spray Place 1 spray into both nostrils daily. 16 g 1  . furosemide (LASIX)  40 MG tablet Taking 80mg  qam & 40mg  qpm (Patient taking differently: Take 40-80 mg by mouth See admin instructions. 80mg  in am, 40mg  in pm) 90 tablet 1  . guaiFENesin (ROBITUSSIN) 100 MG/5ML SOLN Take 5 mLs by mouth every 4 (four) hours as needed for cough or to loosen phlegm.    . Multiple Vitamin (MULTIVITAMIN) tablet Take 1 tablet by mouth daily.    . multivitamin-lutein (OCUVITE-LUTEIN) CAPS Take 1 capsule by mouth daily.    . potassium chloride (K-DUR) 10 MEQ tablet TAKE 1 TABLET EVERY DAY (Patient taking differently: 56meq by mouth once daily) 90 tablet 3  . XARELTO 20 MG TABS tablet TAKE 1 TABLET EVERY DAY WITH SUPPER (Patient taking differently: 20mg  by mouth every evening) 90 tablet 3  . oxaprozin (DAYPRO) 600 MG tablet Take 600 mg by mouth daily.     Facility-Administered Medications Prior to Visit  Medication Dose Route Frequency Provider Last Rate Last Dose  . cyanocobalamin ((VITAMIN B-12)) injection 1,000 mcg  1,000 mcg Intramuscular Q30 days [name], MD   1,000 mcg at 07/25/17 1030     Allergies:   Patient has no known allergies.   Social History   Socioeconomic History  . Marital status: Widowed    Spouse name: None  . Number of children: 1  . Years of education: None  . Highest education level: None  Social Needs  . Financial resource strain: None  . Food insecurity - worry: None  . Food insecurity - inability: None  . Transportation needs - medical: None  . Transportation needs - non-medical: None  Occupational History  . Occupation: Retired  Tobacco Use  . Smoking status: Never Smoker  . Smokeless tobacco: Current User    Types: Snuff  . Tobacco comment: 02/15/12 "still use snuff @ times; not daily"  Substance and Sexual Activity  . Alcohol use: No  . Drug use: No  . Sexual activity: No    Comment: widower, lives with granddaughter and great grandchildren.  Other Topics Concern  . None  Social History Narrative   Lives with grand daughter.           Epworth Sleepiness Scale = 7 (as of 09/21/2015)     Family History:  The patient's family history includes Cancer in his mother; Stroke (age of onset: 51) in his father.   ROS:   Please see the history of present illness.    ROS All other systems reviewed and are negative.   PHYSICAL EXAM:   VS:  BP 132/82   Pulse (!) 103   Ht 5\' 8"  (1.727 m)   Wt 246 lb (111.6 kg)   BMI 37.40 kg/m    GEN: Well nourished, well developed, in no  acute distress  HEENT: normal  Neck: no JVD, carotid bruits, or masses Cardiac: mildly tachycardic; no rubs, or gallops. 3/6 systolic murmur at apex, 2+ pitting edema  Respiratory:  clear to auscultation bilaterally, normal work of breathing GI: soft, nontender, nondistended, + BS MS: no deformity or atrophy  Skin: warm and dry, no rash Neuro:  Alert and Oriented x 3, Strength and sensation are intact Psych: euthymic mood, full affect  Wt Readings from Last 3 Encounters:  09/20/17 246 lb (111.6 kg)  09/15/17 252 lb (114.3 kg)  09/08/17 264 lb 3.2 oz (119.8 kg)      Studies/Labs Reviewed:   EKG:  EKG is ordered today.  The ekg ordered today demonstrates atrial fibrillation, right bundle branch block, heart rate 97  Recent Labs: 09/05/2017: B Natriuretic Peptide 260.7 09/15/2017: ALT 24; BUN 13; Creat 0.88; Hemoglobin 12.7; Platelets 132; Potassium 3.6; Sodium 139   Lipid Panel    Component Value Date/Time   CHOL 176 04/08/2016 0823   TRIG 57 04/08/2016 0823   HDL 64 04/08/2016 0823   CHOLHDL 2.8 04/08/2016 0823   VLDL 11 04/08/2016 0823   LDLCALC 101 04/08/2016 0823    Additional studies/ records that were reviewed today include:   Echo 09/06/2017 LV EF: 55% -   60%  Study Conclusions  - Left ventricle: The cavity size was normal. Wall thickness was   increased in a pattern of mild LVH. Systolic function was normal.   The estimated ejection fraction was in the range of 55% to 60%.   Wall motion was normal; there were no regional  wall motion   abnormalities. - Ventricular septum: The contour showed diastolic flattening. - Aortic valve: Trileaflet; mildly thickened, mildly calcified   leaflets. There was trivial regurgitation. Valve area (Vmax):   2.44 cm^2. - Mitral valve: Calcified annulus. There was moderate   regurgitation. - Left atrium: The atrium was mildly dilated. - Right ventricle: The cavity size was mildly dilated. Wall   thickness was normal. - Tricuspid valve: There was moderate regurgitation. - Pulmonary arteries: Systolic pressure was mildly increased. PA   peak pressure: 36 mm Hg (S).  Impressions:  - Compared to the prior study, there has been no significant   interval change.    ASSESSMENT:    1. Chronic atrial fibrillation ([place])   2. Chronic diastolic heart failure ([place])   3. Essential hypertension   4. Moderate mitral regurgitation   5. Lower extremity edema      PLAN:  In order of problems listed above:  1. Chronic atrial fibrillation on Xarelto: Recently, patient presented to the hospital with significant bradycardia with heart rate in the 30s, 50 mg Toprol-XL has been discontinued.  His heart rate is now running slightly fast.  He is wearing a 24-hour heart monitor, if heart rate persistently elevated, will consider addition of low-dose metoprolol tartrate 12.5 mg twice daily.  Would not recommend higher dose of beta-blocker due to concern of recurrent bradycardia  2. Chronic diastolic heart failure: His weight has dropped from 264 pounds on discharge to 242 pounds today.  Renal function is stable, continue on the current dose of diuretic.    3. Hypertension: Blood pressure well controlled  4. Mitral regurgitation: Moderate on recent echocardiogram, the heart murmur does sound louder than expected.  5. Bilateral lower extremity edema: He continued to have 2+ pitting edema, however given the significant weight loss recently.  I would not recommend any up titration of  diuretic.  Medication Adjustments/Labs and Tests Ordered: Current medicines are reviewed at length with the patient today.  Concerns regarding medicines are outlined above.  Medication changes, Labs and Tests ordered today are listed in the Patient Instructions below. Patient Instructions  Continue same medications     Your physician recommends that you schedule a follow-up appointment in: 2 months with Dr.Hochrein     Signed, Almyra Deforest, [place]  09/20/2017 5:37 PM    [address], [place], [place]  [address] Phone: [phone]; Fax: [phone]

## 2017-09-22 ENCOUNTER — Encounter: Payer: Self-pay | Admitting: Family Medicine

## 2017-09-22 ENCOUNTER — Ambulatory Visit (INDEPENDENT_AMBULATORY_CARE_PROVIDER_SITE_OTHER): Payer: Medicare HMO | Admitting: Family Medicine

## 2017-09-22 VITALS — BP 128/74 | HR 92 | Temp 97.9°F | Resp 22 | Ht 68.0 in | Wt 244.0 lb

## 2017-09-22 DIAGNOSIS — R001 Bradycardia, unspecified: Secondary | ICD-10-CM | POA: Diagnosis not present

## 2017-09-22 DIAGNOSIS — I482 Chronic atrial fibrillation, unspecified: Secondary | ICD-10-CM

## 2017-09-22 NOTE — Progress Notes (Signed)
Subjective:    Patient ID: Austin Gray., male    DOB: 27-Apr-1932, 84 y.o.   MRN: [id number]  HPI  09/05/17 Patient's symptoms started 2 weeks ago with a runny nose and a nonproductive cough.  He presents today complaining of shortness of breath.  Patient is in atrial fibrillation.  His heart rate is extremely  low.  He reports dyspnea on exertion.  He reports orthopnea.  He states that he gets strangled when he lays down.  His weight is up 14 lbs since his last office visit.  He has pitting edema in both legs +1.  At that time, my plan was: Patient has shortness of breath with profound bradycardia and atrial fibrillation.  He appears to be on too much medication.  However I do not feel that the patient is safe to go home with a heart rate of 32 bpm.  Therefore I recommended emergency room evaluation due to shortness of breath what appears to be fluid overload on exam, and symptomatic bradycardia.  Patient was transported via EMS  Patient was admitted to the hospital and I have copied relevant portions of the discharge summary below:  Admit date: 09/05/2017 Discharge date: 09/08/2017  Primary Care Provider: Jenna Luo T Primary Cardiologist: Minus Breeding, MD  Discharge Diagnoses    Principal Problem:   Dyspnea Active Problems:   Bradycardia   Urinary tract infection   Hyponatremia   Allergies No Known Allergies  Diagnostic Studies/Procedures    Echocardiogram 09/06/2017 Study Conclusions - Left ventricle: The cavity size was normal. Wall thickness was increased in a pattern of mild LVH. Systolic function was normal. The estimated ejection fraction was in the range of 55% to 60%. Wall motion was normal; there were no regional wall motion abnormalities. - Ventricular septum: The contour showed diastolic flattening. - Aortic valve: Trileaflet; mildly thickened, mildly calcified leaflets. There was trivial regurgitation. Valve area (Vmax): 2.44  cm^2. - Mitral valve: Calcified annulus. There was moderate regurgitation. - Left atrium: The atrium was mildly dilated. - Right ventricle: The cavity size was mildly dilated. Wall thickness was normal. - Tricuspid valve: There was moderate regurgitation. - Pulmonary arteries: Systolic pressure was mildly increased. PA peak pressure: 36 mm Hg (S).  Impressions: - Compared to the prior study, there has been no significant interval change.  _____________   History of Present Illness     Austin Gray is a 84 year old male with history of diastolic heart failure and atrial fibrillation onmetoprolol andxarelto that presents from his family medicince physician's office on 09/05/17 with bradycardia and atrial fibrillation with symptoms of volume overload that included shortness of breath and lower extremity edema. Patient states that for the past 2 weeks he has had increasing shortness of breath and leg swelling. He reports a recent upper respiratory infectionat that time. He states he takes lasix 80mg  in the morning and 40mg  in the evening without fail.Reports recent increase in liquid intake since he was sick and was afraid to become dehydrated.  Hospital Course     Consultants: Internal medicine.   The Patient had increased BNP and CXR suggested significant volume overload. His dyspnea was treated for heart failure and bronchodilators for wheezing. PCR flu was negative. Repeat CXR was unremarkable for pneumonia. He was found to have UTI with moderate leukocytosis and given Rocephin. He will be discharged home on Ceftin for 7 days and his previous diuretic.   He had some asymptomatic bradycardia and BB is on hold. He  also had hyponatremia with sodium of 129. He was placed on fluid restriction and Na+ 132 today. Will advise 48 ounce fluid restriction. He had acute kidney injury with Baseline SCr 0.8-0.9 up to 1.66, currently 1.49. He was not given lasix. ACE-I is on hold.  Will need repeat BMet early next week.   An echocardiogram was unchanged from previous, see report above.   The patient is feeling better and wants to go home.   Patient has been seen by Dr. Percival Spanish today and deemed ready for discharge home. All follow up appointments have been scheduled. Discharge medications are listed below.   09/15/17 Patient is here today for hospital follow-up.  Patient feels and looks much better.  He has diuresed and lost almost 18 lbs since last visit.  His lungs are much clearer today.  He denies sob or orthopnea.  He still has edema in his legs but this is improving.  Heartrate is elevated 90-98 bpm off of metoprolol.  He denies chest pain or palpitations.  He denies dysuria or frequency.  He is restircting fluid to 62 oz per day for hyponatremia.  At that time, my plan was: Patient had acute renal insufficiency in the hospital.  However his exam was more consistent with fluid overload.  I believe the acute renal insufficiency was due to the urinary tract infection rather than dehydration.  I will repeat a CMP today to monitor renal function.  I will also repeat a urine culture to ensure resolution of the urinary tract infection.  I will repeat a CMP today to monitor his hyponatremia.  This was treated with fluid restriction in the hospital.  Patient is holding his beta-blocker and his bradycardia has improved.  I believe his bradycardia was a combination of the beta-blocker coupled with his acute illness.  I believe the patient was possibly developing urosepsis.  Clinically he appears much better today.  Recheck heart rate next week and resume metoprolol if persistently > 90.  There is no evidence of fluid overload on exam.  His bradycardia has improved.  His lungs are clear.  He denies any chest pain or shortness of breath.  Therefore I will not make any changes in his medication today.  Repeat urine cx.      09/22/17 Patient continues to feel stronger and better since  his hospitalization. His heart rate today is elevated at 92 bpm. He continues to hold his beta blocker. I reviewed his most recent visit with his cardiologist and they were planning to have the patient wear a Holter monitor to determine his average heart rate. The plan tentatively is to resume his metoprolol at a reduced dose if he has tachycardia confirmed as well as significant bradycardia rule out by the Holter monitor. He is already returned the Holter monitor and is waiting to hear back from the cardiologist. He denies any chest pain shortness of breath syncope or lightheadedness. His recent urine culture was negative. He continues to have pitting edema in both legs however he continues to lose weight from diuresis  Past Medical History:  Diagnosis Date  . Anxiety   . Arthritis    "little bit; left knee"  . Atrial fibrillation (HCC)    Chronic  . Basal cell carcinoma   . Cataracts, bilateral    to have left eye cataract surgery on 03/21/17   . CHF (congestive heart failure), NYHA class II, chronic, diastolic ([place])   . Colon polyps    last colonoscopy 2015 (tubular adenoma)  .  Depression   . Diverticulosis   . Gastritis   . GERD (gastroesophageal reflux disease)   . H/O: GI bleed    Recurrent  . Hemorrhoids   . History of blood transfusion   . History of BPH   . History of kidney stones    hx of x 1   . Hypertension   . Pernicious anemia   . PVC (premature ventricular contraction)   . Rectal bleeding 02/15/12  . Visual impairment   . Vitamin D deficiency    Past Surgical History:  Procedure Laterality Date  . COLONOSCOPY WITH PROPOFOL N/A 03/17/2017   Procedure: COLONOSCOPY WITH PROPOFOL;  Surgeon: Carol Ada, MD;  Location: [facility];  Service: Endoscopy;  Laterality: N/A;  . [place]; ~ 2002   left; right   Current Outpatient Medications on File Prior to Visit  Medication Sig Dispense Refill  . acetaminophen (TYLENOL) 500 MG tablet Take 1,000 mg  by mouth every 6 (six) hours as needed for moderate pain.    Marland Kitchen albuterol (PROVENTIL HFA;VENTOLIN HFA) 108 (90 Base) MCG/ACT inhaler Inhale 2 puffs into the lungs every 6 (six) hours as needed for wheezing or shortness of breath. 1 Inhaler 0  . amLODipine (NORVASC) 10 MG tablet TAKE 1 TABLET (10 MG TOTAL) BY MOUTH DAILY. 90 tablet 3  . cloNIDine (CATAPRES) 0.1 MG tablet TAKE 1 TABLET AT BEDTIME (Patient taking differently: 0.1mg  by mouth at bedtime) 90 tablet 3  . doxazosin (CARDURA) 4 MG tablet TAKE 1 TABLET EVERY DAY (Patient taking differently: 4mg  by mouth once daily) 90 tablet 3  . ferrous sulfate 325 (65 FE) MG tablet Take 325 mg by mouth every other day.     . fluticasone (FLONASE) 50 MCG/ACT nasal spray Place 1 spray into both nostrils daily. 16 g 1  . furosemide (LASIX) 40 MG tablet Taking 80mg  qam & 40mg  qpm (Patient taking differently: Take 40 mg by mouth 3 (three) times daily. ) 90 tablet 1  . guaiFENesin (ROBITUSSIN) 100 MG/5ML SOLN Take 5 mLs by mouth every 4 (four) hours as needed for cough or to loosen phlegm.    . Multiple Vitamin (MULTIVITAMIN) tablet Take 1 tablet by mouth daily.    . multivitamin-lutein (OCUVITE-LUTEIN) CAPS Take 1 capsule by mouth daily.    . potassium chloride (K-DUR) 10 MEQ tablet TAKE 1 TABLET EVERY DAY (Patient taking differently: 28meq by mouth once daily) 90 tablet 3  . XARELTO 20 MG TABS tablet TAKE 1 TABLET EVERY DAY WITH SUPPER (Patient taking differently: 20mg  by mouth every evening) 90 tablet 3   Current Facility-Administered Medications on File Prior to Visit  Medication Dose Route Frequency Provider Last Rate Last Dose  . cyanocobalamin ((VITAMIN B-12)) injection 1,000 mcg  1,000 mcg Intramuscular Q30 days [name], MD   1,000 mcg at 07/25/17 1030   No Known Allergies Social History   Socioeconomic History  . Marital status: Widowed    Spouse name: Not on file  . Number of children: 1  . Years of education: Not on file  . Highest  education level: Not on file  Social Needs  . Financial resource strain: Not on file  . Food insecurity - worry: Not on file  . Food insecurity - inability: Not on file  . Transportation needs - medical: Not on file  . Transportation needs - non-medical: Not on file  Occupational History  . Occupation: Retired  Tobacco Use  . Smoking status: Never Smoker  .  Smokeless tobacco: Current User    Types: Snuff  . Tobacco comment: 02/15/12 "still use snuff @ times; not daily"  Substance and Sexual Activity  . Alcohol use: No  . Drug use: No  . Sexual activity: No    Comment: widower, lives with granddaughter and great grandchildren.  Other Topics Concern  . Not on file  Social History Narrative   Lives with grand daughter.         Epworth Sleepiness Scale = 7 (as of 09/21/2015)      Review of Systems  All other systems reviewed and are negative.      Objective:   Physical Exam  Constitutional: He appears well-developed and well-nourished.  Non-toxic appearance. He has a sickly appearance. He does not appear ill.  HENT:  Right Ear: External ear normal.  Left Ear: External ear normal.  Nose: Nose normal.  Mouth/Throat: Oropharynx is clear and moist.  Neck: Neck supple. No JVD present.  Cardiovascular: An irregularly irregular rhythm present. Bradycardia present.  Pulmonary/Chest: Effort normal. No respiratory distress. He has no wheezes. He has no rales.  Abdominal: Soft. Bowel sounds are normal. He exhibits distension.  Musculoskeletal: He exhibits edema.  Lymphadenopathy:    He has no cervical adenopathy.   Wt Readings from Last 3 Encounters:  09/22/17 244 lb (110.7 kg)  09/20/17 246 lb (111.6 kg)  09/15/17 252 lb (114.3 kg)               Assessment & Plan:  Chronic atrial fibrillation (HCC)  Bradycardia  Continue his current medications at the present dose. I would like to resume the patient on a low-dose beta blocker such as metoprolol 12.5 mg by mouth  twice a day similar to that recommended by his cardiologist. However I will defer to their judgment. I have asked the patient to call me next week if he has not yet heard back from the cardiologist regarding the results of his Holter monitor. Unless the patient has significant bradycardia, I believe he would benefit from a low-dose beta blocker given his elevated resting heart rate. Continue Lasix at his current dose. Lungs sound completely clear today. There is no evidence of dehydration.

## 2017-09-27 ENCOUNTER — Telehealth: Payer: Self-pay | Admitting: Family Medicine

## 2017-09-27 NOTE — Telephone Encounter (Signed)
Patient called and left message, only to say that dr pickard had told him to call if he had not heard from the heart doctor? I did not see referral for a heart doc, so I am not sure what the message meant

## 2017-09-27 NOTE — Telephone Encounter (Signed)
Called and spoke to pt and report for holter monitor is not back yet - pt is aware. Will keep check on it and pt know when it comes in.

## 2017-09-29 NOTE — Telephone Encounter (Signed)
He should contact cards because they were going to decide about resuming low dose metoprolol based on the report.

## 2017-09-29 NOTE — Telephone Encounter (Signed)
His report is finally in the computer - can you read it so I can give him results or does he need to call his cardiologist for the results?

## 2017-10-09 ENCOUNTER — Other Ambulatory Visit: Payer: Self-pay | Admitting: Family Medicine

## 2017-10-09 DIAGNOSIS — I482 Chronic atrial fibrillation: Secondary | ICD-10-CM | POA: Diagnosis not present

## 2017-10-09 DIAGNOSIS — I5030 Unspecified diastolic (congestive) heart failure: Secondary | ICD-10-CM | POA: Diagnosis not present

## 2017-10-09 DIAGNOSIS — Z86010 Personal history of colonic polyps: Secondary | ICD-10-CM | POA: Diagnosis not present

## 2017-10-10 ENCOUNTER — Telehealth: Payer: Self-pay | Admitting: Family Medicine

## 2017-10-10 NOTE — Telephone Encounter (Signed)
Pt has questions about medication that he was on but dr. Dennard Schaumann had taken him off of.

## 2017-10-11 NOTE — Telephone Encounter (Signed)
Called and spoke to pt and he has lots of questions about medications he should be taking or not taking. Informed him he needs apt to talk to Dr. Dennard Schaumann about it and apt made.

## 2017-10-12 ENCOUNTER — Encounter: Payer: Self-pay | Admitting: Family Medicine

## 2017-10-12 ENCOUNTER — Ambulatory Visit (INDEPENDENT_AMBULATORY_CARE_PROVIDER_SITE_OTHER): Payer: Medicare HMO | Admitting: Family Medicine

## 2017-10-12 VITALS — BP 120/70 | HR 96 | Temp 98.4°F | Resp 18 | Ht 68.0 in | Wt 236.0 lb

## 2017-10-12 DIAGNOSIS — I481 Persistent atrial fibrillation: Secondary | ICD-10-CM | POA: Diagnosis not present

## 2017-10-12 DIAGNOSIS — I4819 Other persistent atrial fibrillation: Secondary | ICD-10-CM

## 2017-10-12 DIAGNOSIS — D519 Vitamin B12 deficiency anemia, unspecified: Secondary | ICD-10-CM

## 2017-10-12 MED ORDER — METOPROLOL SUCCINATE ER 25 MG PO TB24: 25.0000 mg | ORAL_TABLET | Freq: Every day | ORAL | 3 refills | Status: DC

## 2017-10-12 MED ORDER — METOPROLOL SUCCINATE ER 50 MG PO TB24: 25.0000 mg | ORAL_TABLET | Freq: Every day | ORAL | 3 refills | Status: DC
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
## 2017-10-26 ENCOUNTER — Ambulatory Visit (INDEPENDENT_AMBULATORY_CARE_PROVIDER_SITE_OTHER): Payer: Medicare HMO | Admitting: Family Medicine

## 2017-10-26 ENCOUNTER — Other Ambulatory Visit: Payer: Self-pay

## 2017-10-26 ENCOUNTER — Encounter: Payer: Self-pay | Admitting: Family Medicine

## 2017-10-26 VITALS — BP 130/74 | HR 84 | Temp 98.0°F | Resp 18 | Ht 68.0 in | Wt 239.0 lb

## 2017-10-26 DIAGNOSIS — I4819 Other persistent atrial fibrillation: Secondary | ICD-10-CM

## 2017-10-26 DIAGNOSIS — I481 Persistent atrial fibrillation: Secondary | ICD-10-CM
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
## 2017-11-01 ENCOUNTER — Other Ambulatory Visit: Payer: Self-pay | Admitting: Family Medicine

## 2017-11-01 MED ORDER — METOPROLOL SUCCINATE ER 25 MG PO TB24: 25.0000 mg | ORAL_TABLET | Freq: Every day | ORAL | 3 refills | Status: DC

## 2017-11-07 ENCOUNTER — Other Ambulatory Visit: Payer: Self-pay | Admitting: Family Medicine

## 2017-11-07 MED ORDER — METOPROLOL SUCCINATE ER 25 MG PO TB24: 25.0000 mg | ORAL_TABLET | Freq: Every day | ORAL | 3 refills | Status: DC

## 2017-11-13 ENCOUNTER — Other Ambulatory Visit: Payer: Self-pay | Admitting: *Deleted

## 2017-11-13 MED ORDER — METOPROLOL SUCCINATE ER 25 MG PO TB24: 25.0000 mg | ORAL_TABLET | Freq: Every day | ORAL | 3 refills | Status: DC

## 2017-11-22 ENCOUNTER — Encounter: Payer: Self-pay | Admitting: Cardiology

## 2017-11-22 NOTE — Progress Notes (Signed)
Cardiology Office Note   Date:  11/23/2017   ID:  Austin Mesi., DOB 01/10/1932, MRN [id number]  PCP:  [name], MD  Cardiologist:   Minus Breeding, MD   Chief Complaint  Patient presents with  . Atrial Fibrillation      History of Present Illness: Austin Gray. is a 84 y.o. male who presents for evaluation of atrial fib and acute on chronic systolic and diastolic HF.  He was admitted on 09/05/2017 after he presented to his PCPs office with bradycardia and symptoms of volume overload.  Initial lab work also suggested acute kidney injury with creatinine elevation up to 1.5.  His heart rate was in the 30s in the ER.  His beta-blocker was held during the admission.  His ACE inhibitor was discontinued.  Echocardiogram obtained on 09/06/2017 showed mild LVH, EF 55-60%, moderate MR, moderate TR, PA peak pressure 36 mmHg.  He had normalization of the renal function by 09/15/2017.  His weight had dropped from 264 pounds on the day of discharge to 242 pounds at the last appt..  He wore a Holter and had good rate control.      Since I last saw him he has done well.  He has maintained his weight.  The patient denies any new symptoms such as chest discomfort, neck or arm discomfort. There has been no new shortness of breath, PND or orthopnea. There have been no reported palpitations, presyncope or syncope.  He gets around slowly.   He does not notice palpitations.  He has trouble seeing his scale.   Past Medical History:  Diagnosis Date  . Anxiety   . Arthritis    "little bit; left knee"  . Atrial fibrillation (HCC)    Chronic  . Basal cell carcinoma   . Cataracts, bilateral    to have left eye cataract surgery on 03/21/17   . CHF (congestive heart failure), NYHA class II, chronic, diastolic ([place])   . Colon polyps    last colonoscopy 2015 (tubular adenoma)  . Depression   . Diverticulosis   . Gastritis   . GERD (gastroesophageal reflux disease)   . H/O: GI bleed    Recurrent  . Hemorrhoids   . History of blood transfusion   . History of BPH   . History of kidney stones    hx of x 1   . Hypertension   . Pernicious anemia   . PVC (premature ventricular contraction)   . Rectal bleeding 02/15/12  . Vitamin D deficiency     Past Surgical History:  Procedure Laterality Date  . COLONOSCOPY WITH PROPOFOL N/A 03/17/2017   Procedure: COLONOSCOPY WITH PROPOFOL;  Surgeon: Carol Ada, MD;  Location: [facility];  Service: Endoscopy;  Laterality: N/A;  . [place]; ~ 2002   left; right     Current Outpatient Medications  Medication Sig Dispense Refill  . acetaminophen (TYLENOL) 500 MG tablet Take 1,000 mg by mouth every 6 (six) hours as needed for moderate pain.    Marland Kitchen albuterol (PROVENTIL HFA;VENTOLIN HFA) 108 (90 Base) MCG/ACT inhaler Inhale 2 puffs into the lungs every 6 (six) hours as needed for wheezing or shortness of breath. 1 Inhaler 0  . amLODipine (NORVASC) 10 MG tablet TAKE 1 TABLET (10 MG TOTAL) BY MOUTH DAILY. 90 tablet 3  . cloNIDine (CATAPRES) 0.1 MG tablet TAKE 1 TABLET AT BEDTIME (Patient taking differently: 0.1mg  by mouth at bedtime) 90 tablet 3  .  doxazosin (CARDURA) 4 MG tablet TAKE 1 TABLET EVERY DAY (Patient taking differently: 4mg  by mouth once daily) 90 tablet 3  . ferrous sulfate 325 (65 FE) MG tablet Take 325 mg by mouth every other day.     . fluticasone (FLONASE) 50 MCG/ACT nasal spray Place 1 spray into both nostrils daily. 16 g 1  . furosemide (LASIX) 40 MG tablet Taking 80mg  qam & 40mg  qpm 90 tablet 1  . metoprolol succinate (TOPROL-XL) 25 MG 24 hr tablet Take 1 tablet (25 mg total) by mouth daily. 90 tablet 3  . Multiple Vitamin (MULTIVITAMIN) tablet Take 1 tablet by mouth daily.    . multivitamin-lutein (OCUVITE-LUTEIN) CAPS Take 1 capsule by mouth daily.    . potassium chloride (K-DUR) 10 MEQ tablet TAKE 1 TABLET EVERY DAY (Patient taking differently: 20meq by mouth once daily) 90 tablet 3  . XARELTO 20  MG TABS tablet TAKE 1 TABLET EVERY DAY WITH SUPPER (Patient taking differently: 20mg  by mouth every evening) 90 tablet 3   Current Facility-Administered Medications  Medication Dose Route Frequency Provider Last Rate Last Dose  . cyanocobalamin ((VITAMIN B-12)) injection 1,000 mcg  1,000 mcg Intramuscular Q30 days [name], MD   1,000 mcg at 10/12/17 1228    Allergies:   Patient has no known allergies.    ROS:  Please see the history of present illness.   Otherwise, review of systems are positive for none.   All other systems are reviewed and negative.    PHYSICAL EXAM: VS:  BP 135/84   Pulse 92   Ht 5\' 8"  (1.727 m)   Wt 241 lb 3.2 oz (109.4 kg)   BMI 36.67 kg/m  , BMI Body mass index is 36.67 kg/m.  GENERAL:  Well appearing NECK:  No jugular venous distention, waveform within normal limits, carotid upstroke brisk and symmetric, no bruits, no thyromegaly LUNGS:  Clear to auscultation bilaterally CHEST:  Unremarkable HEART:  PMI not displaced or sustained,S1 and S2 within normal limits, no S3, no S4, no clicks, no rubs, 3 out of 6 apical systolic murmur heard at the left midsternal border and apex murmurs, irregular ABD:  Flat, positive bowel sounds normal in frequency in pitch, no bruits, no rebound, no guarding, no midline pulsatile mass, no hepatomegaly, no splenomegaly EXT:  2 plus pulses throughout, mild bilateral leg edema, no cyanosis no clubbing   EKG:  EKG is not ordered today.   Recent Labs: 09/05/2017: B Natriuretic Peptide 260.7 09/15/2017: ALT 24; BUN 13; Creat 0.88; Hemoglobin 12.7; Platelets 132; Potassium 3.6; Sodium 139    Lipid Panel    Component Value Date/Time   CHOL 176 04/08/2016 0823   TRIG 57 04/08/2016 0823   HDL 64 04/08/2016 0823   CHOLHDL 2.8 04/08/2016 0823   VLDL 11 04/08/2016 0823   LDLCALC 101 04/08/2016 0823      Wt Readings from Last 3 Encounters:  11/23/17 241 lb 3.2 oz (109.4 kg)  10/26/17 239 lb (108.4 kg)  10/12/17 236 lb  (107 kg)      Other studies Reviewed: Additional studies/ records that were reviewed today include:  Labs Review of the above records demonstrates:     ASSESSMENT AND PLAN:  ATRIAL FIB:    CHA2DS2-VASc Score and unadjusted Ischemic Stroke Rate (% per year) is equal to 3.2 % stroke rate/year from a score of 3.   He will continue meds as listed.   MR:    I will follow this and his TR  clinically.   EDEMA:    This is mild and stable.   OBESITY:    We have talked about weight loss.  egies for this.  HTN:   The blood pressure is at target.  No change in therapy.   AKI:  Creat was improved in March.  No change in therapy  ACUTE ON CHRONIC DIASTOLIC HF: He seems to be euvolemic.  He is not weighing himself every day because he has trouble seeing the dial.  However, when we further talked about this he has 3 grandchildren around who can read the dial for him so he understands the importance of recording his weights routinely.  Current medicines are reviewed at length with the patient today.  The patient does not have concerns regarding medicines.  The following changes have been made:  None   Labs/ tests ordered today include:  None  No orders of the defined types were placed in this encounter.    Disposition:   FU with me APP in 3 months.    Signed, Minus Breeding, MD  11/23/2017 9:02 AM    [facility]

## 2017-11-23 ENCOUNTER — Ambulatory Visit: Payer: Medicare HMO | Admitting: Cardiology

## 2017-11-23 ENCOUNTER — Encounter: Payer: Self-pay | Admitting: Cardiology

## 2017-11-23 VITALS — BP 135/84 | HR 92 | Ht 68.0 in | Wt 241.2 lb

## 2017-11-23 DIAGNOSIS — M7989 Other specified soft tissue disorders: Secondary | ICD-10-CM | POA: Insufficient documentation

## 2017-11-23 DIAGNOSIS — I481 Persistent atrial fibrillation: Secondary | ICD-10-CM

## 2017-11-23 DIAGNOSIS — N179 Acute kidney failure, unspecified: Secondary | ICD-10-CM | POA: Diagnosis not present

## 2017-11-23 DIAGNOSIS — I5032 Chronic diastolic (congestive) heart failure: Secondary | ICD-10-CM | POA: Diagnosis not present

## 2017-11-23 DIAGNOSIS — I4819 Other persistent atrial fibrillation: Secondary | ICD-10-CM

## 2017-11-23 DIAGNOSIS — I340 Nonrheumatic mitral (valve) insufficiency: Secondary | ICD-10-CM | POA: Diagnosis not present

## 2017-11-23 NOTE — Patient Instructions (Signed)
Medication Instructions:  Continue current medications  If you need a refill on your cardiac medications before your next appointment, please call your pharmacy.  Labwork: None Ordered  Testing/Procedures: None Ordered  Follow-Up: Your physician wants you to follow-up in: 3 Months with Jory Sims DNP.    Thank you for choosing [facility] at [facility]!!

## 2017-12-08 ENCOUNTER — Ambulatory Visit (INDEPENDENT_AMBULATORY_CARE_PROVIDER_SITE_OTHER): Payer: Medicare HMO

## 2017-12-08 DIAGNOSIS — E538 Deficiency of other specified B group vitamins: Secondary | ICD-10-CM

## 2017-12-08 DIAGNOSIS — D519 Vitamin B12 deficiency anemia, unspecified: Secondary | ICD-10-CM

## 2017-12-08 NOTE — Progress Notes (Signed)
Patient was in office for b12 injection.Patient received injection in his left deltoid.Patient tolerated well  

## 2017-12-20 ENCOUNTER — Other Ambulatory Visit: Payer: Self-pay | Admitting: Family Medicine

## 2017-12-20 DIAGNOSIS — R635 Abnormal weight gain: Secondary | ICD-10-CM

## 2017-12-20 DIAGNOSIS — R0600 Dyspnea, unspecified: Secondary | ICD-10-CM

## 2018-01-16 ENCOUNTER — Telehealth: Payer: Self-pay | Admitting: Family Medicine

## 2018-01-16 ENCOUNTER — Ambulatory Visit (INDEPENDENT_AMBULATORY_CARE_PROVIDER_SITE_OTHER): Payer: Medicare HMO

## 2018-01-16 VITALS — BP 118/70 | HR 81

## 2018-01-16 DIAGNOSIS — D519 Vitamin B12 deficiency anemia, unspecified: Secondary | ICD-10-CM

## 2018-01-16 DIAGNOSIS — Z0130 Encounter for examination of blood pressure without abnormal findings: Secondary | ICD-10-CM

## 2018-01-16 NOTE — Telephone Encounter (Signed)
Patient came in today for a blood pressure check. Blood pressure was 118/70 at rest while sitting. Verified patient medications and patient stated that he is taking medications as prescribed. Please advise?

## 2018-01-16 NOTE — Progress Notes (Signed)
Patient came in today for a blood pressure check and vitamin B12 monthly shot. Patient's blood pressure at rest, sitting  with normal size cuff to right arm was 118/70. Verified medications and patient stated he is taking medications as prescribed. Patient also tolerated vitamin b12 injection well to right deltoid.

## 2018-01-17 ENCOUNTER — Encounter (INDEPENDENT_AMBULATORY_CARE_PROVIDER_SITE_OTHER): Payer: Medicare HMO | Admitting: Ophthalmology

## 2018-01-17 DIAGNOSIS — I10 Essential (primary) hypertension: Secondary | ICD-10-CM

## 2018-01-17 DIAGNOSIS — H43813 Vitreous degeneration, bilateral: Secondary | ICD-10-CM | POA: Diagnosis not present

## 2018-01-17 DIAGNOSIS — H353231 Exudative age-related macular degeneration, bilateral, with active choroidal neovascularization: Secondary | ICD-10-CM | POA: Diagnosis not present

## 2018-01-17 DIAGNOSIS — H35033 Hypertensive retinopathy, bilateral: Secondary | ICD-10-CM

## 2018-01-29 ENCOUNTER — Encounter (INDEPENDENT_AMBULATORY_CARE_PROVIDER_SITE_OTHER): Payer: Medicare HMO | Admitting: Ophthalmology

## 2018-02-13 ENCOUNTER — Ambulatory Visit (INDEPENDENT_AMBULATORY_CARE_PROVIDER_SITE_OTHER): Payer: Medicare HMO

## 2018-02-13 VITALS — BP 130/70 | HR 86

## 2018-02-13 DIAGNOSIS — E538 Deficiency of other specified B group vitamins: Secondary | ICD-10-CM

## 2018-02-13 DIAGNOSIS — D519 Vitamin B12 deficiency anemia, unspecified: Secondary | ICD-10-CM | POA: Diagnosis not present

## 2018-02-13 NOTE — Progress Notes (Signed)
Patient in today for vitamin B12 injection. Injection given in the right deltoid. Patient tolerated well.

## 2018-02-16 ENCOUNTER — Ambulatory Visit (INDEPENDENT_AMBULATORY_CARE_PROVIDER_SITE_OTHER): Payer: Medicare HMO | Admitting: Family Medicine

## 2018-02-16 ENCOUNTER — Encounter: Payer: Self-pay | Admitting: Family Medicine

## 2018-02-16 VITALS — BP 120/78 | HR 92 | Temp 98.1°F | Resp 18 | Ht 68.0 in | Wt 237.0 lb

## 2018-02-16 DIAGNOSIS — H01005 Unspecified blepharitis left lower eyelid: Secondary | ICD-10-CM | POA: Diagnosis not present

## 2018-02-16 DIAGNOSIS — H01002 Unspecified blepharitis right lower eyelid: Secondary | ICD-10-CM

## 2018-02-16 DIAGNOSIS — I5032 Chronic diastolic (congestive) heart failure: Secondary | ICD-10-CM

## 2018-02-16 DIAGNOSIS — I4819 Other persistent atrial fibrillation: Secondary | ICD-10-CM

## 2018-02-16 DIAGNOSIS — I481 Persistent atrial fibrillation: Secondary | ICD-10-CM

## 2018-02-16 DIAGNOSIS — R0600 Dyspnea, unspecified: Secondary | ICD-10-CM | POA: Diagnosis not present

## 2018-02-16 DIAGNOSIS — D369 Benign neoplasm, unspecified site: Secondary | ICD-10-CM | POA: Diagnosis not present

## 2018-02-16 LAB — CBC WITH DIFFERENTIAL/PLATELET
Basophils Absolute: 52 cells/uL (ref 0–200)
Basophils Relative: 0.7 %
EOS ABS: 81 {cells}/uL (ref 15–500)
EOS PCT: 1.1 %
HEMATOCRIT: 40.1 % (ref 38.5–50.0)
Hemoglobin: 13.4 g/dL (ref 13.2–17.1)
LYMPHS ABS: 1569 {cells}/uL (ref 850–3900)
MCH: 28.6 pg (ref 27.0–33.0)
MCHC: 33.4 g/dL (ref 32.0–36.0)
MCV: 85.5 fL (ref 80.0–100.0)
MONOS PCT: 8 %
MPV: 11.8 fL (ref 7.5–12.5)
NEUTROS ABS: 5106 {cells}/uL (ref 1500–7800)
Neutrophils Relative %: 69 %
Platelets: 136 10*3/uL — ABNORMAL LOW (ref 140–400)
RBC: 4.69 10*6/uL (ref 4.20–5.80)
RDW: 13 % (ref 11.0–15.0)
Total Lymphocyte: 21.2 %
WBC mixed population: 592 cells/uL (ref 200–950)
WBC: 7.4 10*3/uL (ref 3.8–10.8)

## 2018-02-16 LAB — COMPLETE METABOLIC PANEL WITH GFR
AG Ratio: 1.7 (calc) (ref 1.0–2.5)
ALKALINE PHOSPHATASE (APISO): 74 U/L (ref 40–115)
ALT: 25 U/L (ref 9–46)
AST: 36 U/L — ABNORMAL HIGH (ref 10–35)
Albumin: 4.2 g/dL (ref 3.6–5.1)
BUN: 13 mg/dL (ref 7–25)
CALCIUM: 9.7 mg/dL (ref 8.6–10.3)
CO2: 29 mmol/L (ref 20–32)
CREATININE: 0.93 mg/dL (ref 0.70–1.11)
Chloride: 100 mmol/L (ref 98–110)
GFR, EST NON AFRICAN AMERICAN: 76 mL/min/{1.73_m2} (ref >=60)
GFR, Est African American: 88 mL/min/{1.73_m2} (ref >=60)
GLOBULIN: 2.5 g/dL (ref 1.9–3.7)
GLUCOSE: 105 mg/dL — AB (ref 65–99)
Potassium: 3.9 mmol/L (ref 3.5–5.3)
SODIUM: 138 mmol/L (ref 135–146)
Total Bilirubin: 1.8 mg/dL — ABNORMAL HIGH (ref 0.2–1.2)
Total Protein: 6.7 g/dL (ref 6.1–8.1)

## 2018-02-16 MED ORDER — ALBUTEROL SULFATE HFA 108 (90 BASE) MCG/ACT IN AERS
2.0000 | INHALATION_SPRAY | Freq: Four times a day (QID) | RESPIRATORY_TRACT | 0 refills | Status: DC | PRN
Start: 2018-02-16 — End: 2018-03-16

## 2018-02-16 MED ORDER — SULFACETAMIDE SODIUM 10 % OP SOLN: 1.0000 [drp] | OPHTHALMIC | 0 refills | Status: DC
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
## 2018-02-19 ENCOUNTER — Other Ambulatory Visit: Payer: Self-pay | Admitting: Family Medicine

## 2018-02-19 DIAGNOSIS — R17 Unspecified jaundice: Secondary | ICD-10-CM

## 2018-02-20 ENCOUNTER — Other Ambulatory Visit: Payer: Self-pay | Admitting: Family Medicine

## 2018-02-20 DIAGNOSIS — R17 Unspecified jaundice: Secondary | ICD-10-CM

## 2018-02-25 NOTE — Progress Notes (Signed)
Cardiology Office Note   Date:  02/26/2018   ID:  Austin Mesi., DOB 08-22-1931, MRN [id number]  PCP:  [name], MD  Cardiologist: [facility]  Chief Complaint  Patient presents with  . Follow-up     History of Present Illness: Austin Guile. is a 84 y.o. male who presents for ongoing assessment and management of atrial fibrillation CHADS VASC Score of 3., history of chronic mixed CHF, bradycardia, most recent echocardiogram revealing LVEF of 55 to 6% on 09/06/2017, also revealing moderate MR, moderate TR, PA peak pressure 36 mmHg  He was last seen by Dr. Percival Spanish on 11/23/2017, he had maintained his weight, had no new symptoms.  No changes were made in his medication regimen.  He was advised to have family members help him to weigh daily and write down the result.  He was seen by primary care physician Dr. Dennard Schaumann on 02/16/2018, after admission for decompensated heart failure.  The patient was treated for UTI along with CHF.  Beta-blocker was decreased in the setting of bradycardia now at 25 mg..  He was also treated for blepharitis of the lower eyelids.  He is without cardiac complaint today. He is denies chest pain, excessive weight gain, fluid retention. He is doing his best to avoid salt. He is medically compliant.   Past Medical History:  Diagnosis Date  . Anxiety   . Arthritis    "little bit; left knee"  . Atrial fibrillation (HCC)    Chronic  . Basal cell carcinoma   . Cataracts, bilateral    to have left eye cataract surgery on 03/21/17   . CHF (congestive heart failure), NYHA class II, chronic, diastolic ([place])   . Colon polyps    last colonoscopy 2015 (tubular adenoma)  . Depression   . Diverticulosis   . Gastritis   . GERD (gastroesophageal reflux disease)   . H/O: GI bleed    Recurrent  . Hemorrhoids   . History of blood transfusion   . History of BPH   . History of kidney stones    hx of x 1   . Hypertension   . Pernicious anemia   . PVC  (premature ventricular contraction)   . Rectal bleeding 02/15/12  . Vitamin D deficiency     Past Surgical History:  Procedure Laterality Date  . COLONOSCOPY WITH PROPOFOL N/A 03/17/2017   Procedure: COLONOSCOPY WITH PROPOFOL;  Surgeon: Carol Ada, MD;  Location: [facility];  Service: Endoscopy;  Laterality: N/A;  . [place]; ~ 2002   left; right     Current Outpatient Medications  Medication Sig Dispense Refill  . acetaminophen (TYLENOL) 500 MG tablet Take 1,000 mg by mouth every 6 (six) hours as needed for moderate pain.    Marland Kitchen albuterol (PROVENTIL HFA;VENTOLIN HFA) 108 (90 Base) MCG/ACT inhaler Inhale 2 puffs into the lungs every 6 (six) hours as needed for wheezing or shortness of breath. 1 Inhaler 0  . amLODipine (NORVASC) 10 MG tablet TAKE 1 TABLET (10 MG TOTAL) BY MOUTH DAILY. 90 tablet 3  . cloNIDine (CATAPRES) 0.1 MG tablet TAKE 1 TABLET AT BEDTIME 90 tablet 3  . doxazosin (CARDURA) 4 MG tablet TAKE 1 TABLET EVERY DAY 90 tablet 3  . ferrous sulfate 325 (65 FE) MG tablet Take 325 mg by mouth every other day.     . fluticasone (FLONASE) 50 MCG/ACT nasal spray Place 1 spray into both nostrils daily. 16 g 1  . furosemide (  LASIX) 40 MG tablet TAKE 2 TABLETS (80MG ) EVERY MORNING AND 1 TABLET (40MG ) EVERY EVENING 270 tablet 3  . metoprolol succinate (TOPROL-XL) 25 MG 24 hr tablet Take 1 tablet (25 mg total) by mouth daily. 90 tablet 3  . Multiple Vitamin (MULTIVITAMIN) tablet Take 1 tablet by mouth daily.    . multivitamin-lutein (OCUVITE-LUTEIN) CAPS Take 1 capsule by mouth daily.    . potassium chloride (K-DUR) 10 MEQ tablet TAKE 1 TABLET EVERY DAY 90 tablet 3  . sulfacetamide (BLEPH-10) 10 % ophthalmic solution Place 1 drop into both eyes every 3 (three) hours. 15 mL 0  . XARELTO 20 MG TABS tablet TAKE 1 TABLET EVERY DAY WITH SUPPER 90 tablet 3   Current Facility-Administered Medications  Medication Dose Route Frequency Provider Last Rate Last Dose  .  cyanocobalamin ((VITAMIN B-12)) injection 1,000 mcg  1,000 mcg Intramuscular Q30 days [name], MD   1,000 mcg at 02/13/18 1150    Allergies:   Patient has no known allergies.    Social History:  The patient  reports that he has never smoked. He quit smokeless tobacco use about 4 weeks ago.  His smokeless tobacco use included snuff. He reports that he does not drink alcohol or use drugs.   Family History:  The patient's family history includes Cancer in his mother; Stroke (age of onset: 52) in his father.    ROS: All other systems are reviewed and negative. Unless otherwise mentioned in H&P    PHYSICAL EXAM: VS:  BP (!) 156/80   Pulse 95   Ht 5\' 8"  (1.727 m)   Wt 242 lb (109.8 kg)   BMI 36.80 kg/m  , BMI Body mass index is 36.8 kg/m. GEN: Well nourished, well developed, in no acute distress Obese Intense body oder.  HEENT: normal  Neck: no JVD, carotid bruits, or masses Cardiac:IRRR; 2/6 systolic murmur, rubs, or gallops,no edema  Respiratory: Clear to auscultation bilaterally, normal work of breathing GI: soft, nontender, nondistended, + BS MS: no deformity or atrophy  Skin: warm and dry, no rash Neuro:  Strength and sensation are intact Psych: euthymic mood, full affect   EKG: Not completed this office visit.   Recent Labs: 09/05/2017: B Natriuretic Peptide 260.7 02/16/2018: ALT 25; BUN 13; Creat 0.93; Hemoglobin 13.4; Platelets 136; Potassium 3.9; Sodium 138    Lipid Panel    Component Value Date/Time   CHOL 176 04/08/2016 0823   TRIG 57 04/08/2016 0823   HDL 64 04/08/2016 0823   CHOLHDL 2.8 04/08/2016 0823   VLDL 11 04/08/2016 0823   LDLCALC 101 04/08/2016 0823      Wt Readings from Last 3 Encounters:  02/26/18 242 lb (109.8 kg)  02/16/18 237 lb (107.5 kg)  11/23/17 241 lb 3.2 oz (109.4 kg)      Other studies Reviewed: Echocardiogram 10-04-2017  Left ventricle: The cavity size was normal. Wall thickness was   increased in a pattern of mild LVH.  Systolic function was normal.   The estimated ejection fraction was in the range of 55% to 60%.   Wall motion was normal; there were no regional wall motion   abnormalities. - Ventricular septum: The contour showed diastolic flattening. - Aortic valve: Trileaflet; mildly thickened, mildly calcified   leaflets. There was trivial regurgitation. Valve area (Vmax):   2.44 cm^2. - Mitral valve: Calcified annulus. There was moderate   regurgitation. - Left atrium: The atrium was mildly dilated. - Right ventricle: The cavity size was mildly dilated.  Wall   thickness was normal. - Tricuspid valve: There was moderate regurgitation. - Pulmonary arteries: Systolic pressure was mildly increased. PA   peak pressure: 36 mm Hg (S).  Impressions:  - Compared to the prior study, there has been no significant   interval change.   ASSESSMENT AND PLAN:  1. Atrial fib: Heart rate is better controlled now instead of when initially seen on arrival. He is tolerating the Xarelto. No bleeding or excessive bruising. Metoprolol was reduced to 25 mg due to bradycardia. He denies dizziness or palpitations.    2. Chronic Mixed CHF: He does not appear fluid overloaded this office visit. He is doing his best to avoid salt. He is weighing every day with the help of his family. Weight goes up and down 2-3 lbs but nothing higher than that. He will continue diuretics as directed.   3. Hypertension: BP is slightly elevated today, but I rechecked it and found it to be 138/78.    Current medicines are reviewed at length with the patient today.    Labs/ tests ordered today include: None  Phill Myron. West Pugh, ANP, AACC   02/26/2018 10:09 AM    [facility] [address], [place], [place] [address] Phone: [phone]; Fax: [phone]

## 2018-02-26 ENCOUNTER — Encounter: Payer: Self-pay | Admitting: Adult Health

## 2018-02-26 ENCOUNTER — Ambulatory Visit: Payer: Medicare HMO | Admitting: Adult Health

## 2018-02-26 VITALS — BP 156/80 | HR 95 | Ht 68.0 in | Wt 242.0 lb

## 2018-02-26 DIAGNOSIS — I482 Chronic atrial fibrillation: Secondary | ICD-10-CM | POA: Diagnosis not present

## 2018-02-26 DIAGNOSIS — I5042 Chronic combined systolic (congestive) and diastolic (congestive) heart failure: Secondary | ICD-10-CM

## 2018-02-26 DIAGNOSIS — I4821 Permanent atrial fibrillation: Secondary | ICD-10-CM

## 2018-02-26 DIAGNOSIS — I10 Essential (primary) hypertension: Secondary | ICD-10-CM | POA: Diagnosis not present

## 2018-02-26 NOTE — Patient Instructions (Signed)
Medication Instructions:  NO CHANGES- Your physician recommends that you continue on your current medications as directed. Please refer to the Current Medication list given to you today.  If you need a refill on your cardiac medications before your next appointment, please call your pharmacy.  Follow-Up: Your physician wants you to follow-up in: [place] should receive a reminder letter in the mail two months in advance. If you do not receive a letter, please call our office NOV 2019 to schedule the JAN 2020 follow-up appointment.   Thank you for choosing [facility] at [facility]!!

## 2018-03-05 ENCOUNTER — Other Ambulatory Visit: Payer: Self-pay | Admitting: Gastroenterology

## 2018-03-05 ENCOUNTER — Telehealth: Payer: Self-pay | Admitting: *Deleted

## 2018-03-05 DIAGNOSIS — Z86010 Personal history of colonic polyps: Secondary | ICD-10-CM | POA: Diagnosis not present

## 2018-03-05 DIAGNOSIS — I482 Chronic atrial fibrillation: Secondary | ICD-10-CM | POA: Diagnosis not present

## 2018-03-05 DIAGNOSIS — I5030 Unspecified diastolic (congestive) heart failure: Secondary | ICD-10-CM | POA: Diagnosis not present

## 2018-03-06 ENCOUNTER — Telehealth: Payer: Self-pay

## 2018-03-06 ENCOUNTER — Ambulatory Visit
Admission: RE | Admit: 2018-03-06 | Discharge: 2018-03-06 | Disposition: A | Discharge status: Home / Self Care | Payer: Medicare HMO | Source: Ambulatory Visit | Attending: Family Medicine | Admitting: Family Medicine

## 2018-03-06 DIAGNOSIS — R17 Unspecified jaundice: Secondary | ICD-10-CM

## 2018-03-06 NOTE — Telephone Encounter (Signed)
Routed to Dr. Sarajane Marek, RN, [place] [address] [place], [place]  [address] [phone]

## 2018-03-06 NOTE — Telephone Encounter (Signed)
   [facility] Pre-operative Risk Assessment    Request for surgical clearance:  1. What type of surgery is being performed? colonocopy  2. When is this surgery scheduled? 04/06/18  3. What type of clearance is required (medical clearance vs. Pharmacy clearance to hold med vs. Both)? Both  4. Are there any medications that need to be held prior to surgery and how long? Xarelto  5. Practice name and name of physician performing surgery? [facility]  6. What is your office phone number [phone]    7.   What is your office fax number [phone]  8.   Anesthesia type (None, local, MAC, general) ? unknown   Kathyrn Lass 03/06/2018, 11:30 AM  _________________________________________________________________   (provider comments below)

## 2018-03-06 NOTE — Telephone Encounter (Signed)
Routing to pharmacy.  Seen here for regular OV earlier this month - was doing well.  Burtis Junes, RN, [facility] [address] [place], [place]  [address] [phone]

## 2018-03-06 NOTE — Telephone Encounter (Signed)
Pt takes Xarelto for afib with CHADS2VASc score of 4 (age x2, CHF, HTN). Renal function is normal. Ok to hold Xarelto 1-2 days prior to procedure.

## 2018-03-06 NOTE — Telephone Encounter (Signed)
See previous 03/06/18 note.

## 2018-03-08 ENCOUNTER — Other Ambulatory Visit: Payer: Self-pay | Admitting: Family Medicine

## 2018-03-08 DIAGNOSIS — R17 Unspecified jaundice: Secondary | ICD-10-CM

## 2018-03-13 ENCOUNTER — Encounter: Payer: Self-pay | Admitting: Family Medicine

## 2018-03-13 ENCOUNTER — Ambulatory Visit (INDEPENDENT_AMBULATORY_CARE_PROVIDER_SITE_OTHER): Payer: Medicare HMO | Admitting: Family Medicine

## 2018-03-13 VITALS — BP 138/79 | HR 80 | Temp 98.0°F | Resp 20 | Ht 68.0 in | Wt 242.0 lb

## 2018-03-13 DIAGNOSIS — R17 Unspecified jaundice: Secondary | ICD-10-CM | POA: Diagnosis not present

## 2018-03-13 NOTE — Progress Notes (Signed)
Subjective:    Patient ID: Austin Mesi., male    DOB: 05-Jul-1932, 84 y.o.   MRN: [id number]  HPI  At the patient's last visit, his bilirubin was found to be slightly elevated.  Reviewing his chart, there has been a steady rise in his bilirubin over the last 6 months.  At that point a right upper quadrant ultrasound was obtained that revealed no cause for his elevated bilirubin although it did reveal multiple mildly complex cysts: FINDINGS: Gallbladder:  No gallstones or wall thickening visualized. No sonographic Murphy sign noted by sonographer.  Common bile duct:  Diameter: 4.4 mm  Liver:  Liver parenchyma diffusely echogenic.  5 cm cyst in the right lobe of the liver. 3 cm slightly complex cyst in the left lobe liver. 15 mm cyst left lobe liver is mildly complex. Portal vein is patent on color Doppler imaging with normal direction of blood flow towards the liver. He is here today asking if he should have a colonoscopy.  I spent more than 15 minutes today with the patient discussing his age, the risk of anesthesia, the pretest likelihood of finding malignancy.  I think based on his age, his medical comorbidities, the risk of anesthesia, I would recommend not proceeding with a colonoscopy for screening purposes.  Instead, I would only perform a colonoscopy at this point if the patient had symptoms that warranted a colonoscopy.  I explained to the patient that this is my opinion and not medical fact.  Patient would like to think about this and make his decision.  We also discussed his elevated bilirubin.  On his last labs, bilirubin was mildly elevated at 1.8.  AST was mildly elevated at 36.  Alkaline phosphatase and ALT were normal.  Bilirubin was normal in February.  This is only been occurring over the last 6 months.  Patient is not jaundiced.  He denies any abdominal pain.  Past Medical History:  Diagnosis Date  . Anxiety   . Arthritis    "little bit; left knee"  .  Atrial fibrillation (HCC)    Chronic  . Basal cell carcinoma   . Cataracts, bilateral    to have left eye cataract surgery on 03/21/17   . CHF (congestive heart failure), NYHA class II, chronic, diastolic ([place])   . Colon polyps    last colonoscopy 2015 (tubular adenoma)  . Depression   . Diverticulosis   . Gastritis   . GERD (gastroesophageal reflux disease)   . H/O: GI bleed    Recurrent  . Hemorrhoids   . History of blood transfusion   . History of BPH   . History of kidney stones    hx of x 1   . Hypertension   . Pernicious anemia   . PVC (premature ventricular contraction)   . Rectal bleeding 02/15/12  . Vitamin D deficiency    Past Surgical History:  Procedure Laterality Date  . COLONOSCOPY WITH PROPOFOL N/A 03/17/2017   Procedure: COLONOSCOPY WITH PROPOFOL;  Surgeon: Carol Ada, MD;  Location: [facility];  Service: Endoscopy;  Laterality: N/A;  . [place]; ~ 2002   left; right   Current Outpatient Medications on File Prior to Visit  Medication Sig Dispense Refill  . acetaminophen (TYLENOL) 500 MG tablet Take 1,000 mg by mouth every 6 (six) hours as needed for moderate pain.    Marland Kitchen albuterol (PROVENTIL HFA;VENTOLIN HFA) 108 (90 Base) MCG/ACT inhaler Inhale 2 puffs into the lungs every 6 (  six) hours as needed for wheezing or shortness of breath. 1 Inhaler 0  . amLODipine (NORVASC) 10 MG tablet TAKE 1 TABLET (10 MG TOTAL) BY MOUTH DAILY. 90 tablet 3  . cloNIDine (CATAPRES) 0.1 MG tablet TAKE 1 TABLET AT BEDTIME 90 tablet 3  . doxazosin (CARDURA) 4 MG tablet TAKE 1 TABLET EVERY DAY 90 tablet 3  . ferrous sulfate 325 (65 FE) MG tablet Take 325 mg by mouth every other day.     . fluticasone (FLONASE) 50 MCG/ACT nasal spray Place 1 spray into both nostrils daily. 16 g 1  . furosemide (LASIX) 40 MG tablet TAKE 2 TABLETS (80MG ) EVERY MORNING AND 1 TABLET (40MG ) EVERY EVENING 270 tablet 3  . metoprolol succinate (TOPROL-XL) 25 MG 24 hr tablet Take 1 tablet (25  mg total) by mouth daily. 90 tablet 3  . Multiple Vitamin (MULTIVITAMIN) tablet Take 1 tablet by mouth daily.    . multivitamin-lutein (OCUVITE-LUTEIN) CAPS Take 1 capsule by mouth daily.    . potassium chloride (K-DUR) 10 MEQ tablet TAKE 1 TABLET EVERY DAY 90 tablet 3  . sulfacetamide (BLEPH-10) 10 % ophthalmic solution Place 1 drop into both eyes every 3 (three) hours. 15 mL 0  . XARELTO 20 MG TABS tablet TAKE 1 TABLET EVERY DAY WITH SUPPER 90 tablet 3   Current Facility-Administered Medications on File Prior to Visit  Medication Dose Route Frequency Provider Last Rate Last Dose  . cyanocobalamin ((VITAMIN B-12)) injection 1,000 mcg  1,000 mcg Intramuscular Q30 days [name], MD   1,000 mcg at 02/13/18 1150   No Known Allergies Social History   Socioeconomic History  . Marital status: Widowed    Spouse name: Not on file  . Number of children: 1  . Years of education: Not on file  . Highest education level: Not on file  Occupational History  . Occupation: Retired  Scientific laboratory technician  . Financial resource strain: Not on file  . Food insecurity:    Worry: Not on file    Inability: Not on file  . Transportation needs:    Medical: Not on file    Non-medical: Not on file  Tobacco Use  . Smoking status: Never Smoker  . Smokeless tobacco: Former Systems developer    Types: Snuff  . Tobacco comment: 02/15/12 "still use snuff @ times; not daily"  Substance and Sexual Activity  . Alcohol use: No  . Drug use: No  . Sexual activity: Never    Comment: widower, lives with granddaughter and great grandchildren.  Lifestyle  . Physical activity:    Days per week: Not on file    Minutes per session: Not on file  . Stress: Not on file  Relationships  . Social connections:    Talks on phone: Not on file    Gets together: Not on file    Attends religious service: Not on file    Active member of club or organization: Not on file    Attends meetings of clubs or organizations: Not on file     Relationship status: Not on file  . Intimate partner violence:    Fear of current or ex partner: Not on file    Emotionally abused: Not on file    Physically abused: Not on file    Forced sexual activity: Not on file  Other Topics Concern  . Not on file  Social History Narrative   Lives with grand daughter.         Epworth Sleepiness  Scale = 7 (as of 09/21/2015)      Review of Systems  All other systems reviewed and are negative.      Objective:   Physical Exam  Constitutional: He appears well-developed and well-nourished.  Non-toxic appearance. He does not have a sickly appearance. He does not appear ill.  HENT:  Right Ear: External ear normal.  Left Ear: External ear normal.  Nose: Nose normal.  Mouth/Throat: Oropharynx is clear and moist.  Neck: Neck supple. No JVD present.  Cardiovascular: Normal rate. An irregularly irregular rhythm present.  Pulmonary/Chest: Effort normal. No respiratory distress. He has no wheezes. He has no rales. He exhibits no tenderness.  Abdominal: Soft. Bowel sounds are normal. He exhibits no distension and no mass. There is no tenderness. There is no rebound and no guarding.  Musculoskeletal: He exhibits no edema.  Lymphadenopathy:    He has no cervical adenopathy.   Wt Readings from Last 3 Encounters:  03/13/18 242 lb (109.8 kg)  02/26/18 242 lb (109.8 kg)  02/16/18 237 lb (107.5 kg)               Assessment & Plan:  Elevated bilirubin - Plan: Bilirubin, total, Gamma GT, COMPLETE METABOLIC PANEL WITH GFR, Bilirubin, direct I will repeat a CMP to monitor his bilirubin.  I will also check a direct bilirubin to determine if this is due to inefficient conjugation or possible obstructive flow.  I will also check a GGT to look for any evidence of inflammation in the biliary tree..  I have recommended against a colonoscopy simply for screening purposes due to his age and medical comorbidities.  Patient will consider this in the notify  the gastroenterologist of his plan.

## 2018-03-14 LAB — COMPLETE METABOLIC PANEL WITH GFR
AG RATIO: 1.6 (calc) (ref 1.0–2.5)
ALKALINE PHOSPHATASE (APISO): 69 U/L (ref 40–115)
ALT: 25 U/L (ref 9–46)
AST: 37 U/L — AB (ref 10–35)
Albumin: 4.2 g/dL (ref 3.6–5.1)
BILIRUBIN TOTAL: 1.4 mg/dL — AB (ref 0.2–1.2)
BUN: 13 mg/dL (ref 7–25)
CALCIUM: 9.6 mg/dL (ref 8.6–10.3)
CHLORIDE: 102 mmol/L (ref 98–110)
CO2: 28 mmol/L (ref 20–32)
Creat: 0.95 mg/dL (ref 0.70–1.11)
GFR, EST NON AFRICAN AMERICAN: 73 mL/min/{1.73_m2} (ref >=60)
GFR, Est African American: 85 mL/min/{1.73_m2} (ref >=60)
GLOBULIN: 2.7 g/dL (ref 1.9–3.7)
Glucose, Bld: 114 mg/dL — ABNORMAL HIGH (ref 65–99)
Potassium: 4.1 mmol/L (ref 3.5–5.3)
SODIUM: 139 mmol/L (ref 135–146)
Total Protein: 6.9 g/dL (ref 6.1–8.1)

## 2018-03-14 LAB — GAMMA GT: GGT: 31 U/L (ref 3–70)

## 2018-03-14 LAB — BILIRUBIN, DIRECT: Bilirubin, Direct: 0.3 mg/dL — ABNORMAL HIGH (ref 0.0–0.2)

## 2018-03-16 ENCOUNTER — Other Ambulatory Visit: Payer: Self-pay | Admitting: Family Medicine

## 2018-03-16 DIAGNOSIS — R0600 Dyspnea, unspecified: Secondary | ICD-10-CM

## 2018-03-22 ENCOUNTER — Other Ambulatory Visit: Payer: Self-pay | Admitting: Family Medicine

## 2018-03-22 ENCOUNTER — Ambulatory Visit (INDEPENDENT_AMBULATORY_CARE_PROVIDER_SITE_OTHER): Payer: Medicare HMO

## 2018-03-22 VITALS — BP 142/70 | Ht 68.0 in | Wt 249.2 lb

## 2018-03-22 DIAGNOSIS — E538 Deficiency of other specified B group vitamins: Secondary | ICD-10-CM

## 2018-03-22 DIAGNOSIS — D519 Vitamin B12 deficiency anemia, unspecified: Secondary | ICD-10-CM | POA: Diagnosis not present

## 2018-04-06 ENCOUNTER — Encounter (HOSPITAL_COMMUNITY): Payer: Self-pay

## 2018-04-06 ENCOUNTER — Ambulatory Visit (HOSPITAL_COMMUNITY): Admit: 2018-04-06 | Payer: Medicare HMO | Admitting: Gastroenterology

## 2018-04-06 SURGERY — COLONOSCOPY WITH PROPOFOL
Anesthesia: Monitor Anesthesia Care

## 2018-04-24 ENCOUNTER — Ambulatory Visit (INDEPENDENT_AMBULATORY_CARE_PROVIDER_SITE_OTHER): Payer: Medicare HMO

## 2018-04-24 DIAGNOSIS — D519 Vitamin B12 deficiency anemia, unspecified: Secondary | ICD-10-CM

## 2018-04-24 DIAGNOSIS — Z23 Encounter for immunization: Secondary | ICD-10-CM | POA: Diagnosis not present

## 2018-04-24 DIAGNOSIS — E538 Deficiency of other specified B group vitamins: Secondary | ICD-10-CM

## 2018-04-24 NOTE — Progress Notes (Signed)
Patient was in office for flu vaccine as well as b12 injection.Patient received both injections in his left deltoid patient tolerated well.

## 2018-04-27 ENCOUNTER — Ambulatory Visit (INDEPENDENT_AMBULATORY_CARE_PROVIDER_SITE_OTHER): Payer: Medicare HMO | Admitting: Family Medicine

## 2018-04-27 ENCOUNTER — Encounter: Payer: Self-pay | Admitting: Family Medicine

## 2018-04-27 VITALS — BP 140/80 | HR 88 | Temp 97.9°F | Resp 18 | Ht 68.0 in | Wt 245.0 lb

## 2018-04-27 DIAGNOSIS — I480 Paroxysmal atrial fibrillation: Secondary | ICD-10-CM

## 2018-04-27 DIAGNOSIS — I5032 Chronic diastolic (congestive) heart failure: Secondary | ICD-10-CM | POA: Diagnosis not present

## 2018-04-27 LAB — COMPLETE METABOLIC PANEL WITH GFR
AG RATIO: 1.6 (calc) (ref 1.0–2.5)
ALT: 19 U/L (ref 9–46)
AST: 30 U/L (ref 10–35)
Albumin: 3.9 g/dL (ref 3.6–5.1)
Alkaline phosphatase (APISO): 74 U/L (ref 40–115)
BILIRUBIN TOTAL: 1.4 mg/dL — AB (ref 0.2–1.2)
BUN: 10 mg/dL (ref 7–25)
CHLORIDE: 102 mmol/L (ref 98–110)
CO2: 27 mmol/L (ref 20–32)
Calcium: 9.5 mg/dL (ref 8.6–10.3)
Creat: 0.89 mg/dL (ref 0.70–1.11)
GFR, Est African American: 91 mL/min/{1.73_m2} (ref >=60)
GFR, Est Non African American: 79 mL/min/{1.73_m2} (ref >=60)
GLOBULIN: 2.4 g/dL (ref 1.9–3.7)
Glucose, Bld: 99 mg/dL (ref 65–99)
POTASSIUM: 3.4 mmol/L — AB (ref 3.5–5.3)
SODIUM: 139 mmol/L (ref 135–146)
Total Protein: 6.3 g/dL (ref 6.1–8.1)

## 2018-04-27 LAB — CBC WITH DIFFERENTIAL/PLATELET
BASOS ABS: 37 {cells}/uL (ref 0–200)
Basophils Relative: 0.5 %
Eosinophils Absolute: 80 cells/uL (ref 15–500)
Eosinophils Relative: 1.1 %
HCT: 38.2 % — ABNORMAL LOW (ref 38.5–50.0)
Hemoglobin: 12.8 g/dL — ABNORMAL LOW (ref 13.2–17.1)
Lymphs Abs: 1248 cells/uL (ref 850–3900)
MCH: 28.5 pg (ref 27.0–33.0)
MCHC: 33.5 g/dL (ref 32.0–36.0)
MCV: 85.1 fL (ref 80.0–100.0)
MPV: 11.6 fL (ref 7.5–12.5)
Monocytes Relative: 7.6 %
NEUTROS PCT: 73.7 %
Neutro Abs: 5380 cells/uL (ref 1500–7800)
Platelets: 138 10*3/uL — ABNORMAL LOW (ref 140–400)
RBC: 4.49 10*6/uL (ref 4.20–5.80)
RDW: 12.9 % (ref 11.0–15.0)
TOTAL LYMPHOCYTE: 17.1 %
WBC mixed population: 555 cells/uL (ref 200–950)
WBC: 7.3 10*3/uL (ref 3.8–10.8)
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
## 2018-05-22 ENCOUNTER — Other Ambulatory Visit: Payer: Self-pay | Admitting: Family Medicine

## 2018-05-29 ENCOUNTER — Ambulatory Visit (INDEPENDENT_AMBULATORY_CARE_PROVIDER_SITE_OTHER): Payer: Medicare HMO

## 2018-05-29 DIAGNOSIS — D519 Vitamin B12 deficiency anemia, unspecified: Secondary | ICD-10-CM

## 2018-05-29 DIAGNOSIS — E538 Deficiency of other specified B group vitamins: Secondary | ICD-10-CM

## 2018-05-29 NOTE — Progress Notes (Signed)
Patient came in today to receive B12 injection. Injection was given in the right deltoid. Patient tolerated well.

## 2018-05-30 ENCOUNTER — Other Ambulatory Visit: Payer: Self-pay | Admitting: Family Medicine

## 2018-06-02 ENCOUNTER — Telehealth: Payer: Self-pay | Admitting: *Deleted

## 2018-06-02 NOTE — Telephone Encounter (Signed)
Received HR/ SPO2 readings from patient. Noted to range from HR 63- 92 and SPO2 97-99%.  MD reviewed and reports that HR and SPO2 are great. No changes to be made.   Call placed to patient and patient made aware.

## 2018-07-03 ENCOUNTER — Ambulatory Visit (INDEPENDENT_AMBULATORY_CARE_PROVIDER_SITE_OTHER): Payer: Medicare HMO | Admitting: Family Medicine

## 2018-07-03 DIAGNOSIS — E538 Deficiency of other specified B group vitamins: Secondary | ICD-10-CM

## 2018-07-03 DIAGNOSIS — D519 Vitamin B12 deficiency anemia, unspecified: Secondary | ICD-10-CM | POA: Diagnosis not present

## 2018-08-10 ENCOUNTER — Ambulatory Visit (INDEPENDENT_AMBULATORY_CARE_PROVIDER_SITE_OTHER): Payer: Medicare HMO | Admitting: Family Medicine

## 2018-08-10 ENCOUNTER — Encounter: Payer: Self-pay | Admitting: Family Medicine

## 2018-08-10 VITALS — BP 136/74 | HR 84 | Temp 98.2°F | Resp 20 | Ht 68.0 in | Wt 251.0 lb

## 2018-08-10 DIAGNOSIS — I480 Paroxysmal atrial fibrillation: Secondary | ICD-10-CM | POA: Diagnosis not present

## 2018-08-10 DIAGNOSIS — D519 Vitamin B12 deficiency anemia, unspecified: Secondary | ICD-10-CM | POA: Diagnosis not present

## 2018-08-10 DIAGNOSIS — E538 Deficiency of other specified B group vitamins: Secondary | ICD-10-CM

## 2018-08-10 DIAGNOSIS — R7309 Other abnormal glucose: Secondary | ICD-10-CM | POA: Diagnosis not present

## 2018-08-10 DIAGNOSIS — R17 Unspecified jaundice: Secondary | ICD-10-CM | POA: Diagnosis not present

## 2018-08-10 DIAGNOSIS — I5032 Chronic diastolic (congestive) heart failure: Secondary | ICD-10-CM

## 2018-08-10 NOTE — Progress Notes (Signed)
Subjective:    Patient ID: Austin Mesi., male    DOB: [birth year], 85 y.o.   MRN: [id number]  Medication Refill     09/05/17 Patient's symptoms started 2 weeks ago with a runny nose and a nonproductive cough.  He presents today complaining of shortness of breath.  Patient is in atrial fibrillation.  His heart rate is extremely  low.  He reports dyspnea on exertion.  He reports orthopnea.  He states that he gets strangled when he lays down.  His weight is up 14 lbs since his last office visit.  He has pitting edema in both legs +1.  At that time, my plan was: Patient has shortness of breath with profound bradycardia and atrial fibrillation.  He appears to be on too much medication.  However I do not feel that the patient is safe to go home with a heart rate of 32 bpm.  Therefore I recommended emergency room evaluation due to shortness of breath what appears to be fluid overload on exam, and symptomatic bradycardia.  Patient was transported via EMS  Patient was admitted to the hospital and I have copied relevant portions of the discharge summary below:  Admit date: 09/05/2017 Discharge date: 09/08/2017  Primary Care Provider: Jenna Luo T Primary Cardiologist: Minus Breeding, MD  Discharge Diagnoses    Principal Problem:   Dyspnea Active Problems:   Bradycardia   Urinary tract infection   Hyponatremia   Allergies No Known Allergies  Diagnostic Studies/Procedures    Echocardiogram 09/06/2017 Study Conclusions - Left ventricle: The cavity size was normal. Wall thickness was increased in a pattern of mild LVH. Systolic function was normal. The estimated ejection fraction was in the range of 55% to 60%. Wall motion was normal; there were no regional wall motion abnormalities. - Ventricular septum: The contour showed diastolic flattening. - Aortic valve: Trileaflet; mildly thickened, mildly calcified leaflets. There was trivial regurgitation. Valve area  (Vmax): 2.44 cm^2. - Mitral valve: Calcified annulus. There was moderate regurgitation. - Left atrium: The atrium was mildly dilated. - Right ventricle: The cavity size was mildly dilated. Wall thickness was normal. - Tricuspid valve: There was moderate regurgitation. - Pulmonary arteries: Systolic pressure was mildly increased. PA peak pressure: 36 mm Hg (S).  Impressions: - Compared to the prior study, there has been no significant interval change.  _____________   History of Present Illness     Austin Gray is a 85 year old male with history of diastolic heart failure and atrial fibrillation onmetoprolol andxarelto that presents from his family medicince physician's office on 09/05/17 with bradycardia and atrial fibrillation with symptoms of volume overload that included shortness of breath and lower extremity edema. Patient states that for the past 2 weeks he has had increasing shortness of breath and leg swelling. He reports a recent upper respiratory infectionat that time. He states he takes lasix 80mg  in the morning and 40mg  in the evening without fail.Reports recent increase in liquid intake since he was sick and was afraid to become dehydrated.  Hospital Course     Consultants: Internal medicine.   The Patient had increased BNP and CXR suggested significant volume overload. His dyspnea was treated for heart failure and bronchodilators for wheezing. PCR flu was negative. Repeat CXR was unremarkable for pneumonia. He was found to have UTI with moderate leukocytosis and given Rocephin. He will be discharged home on Ceftin for 7 days and his previous diuretic.   He had some asymptomatic bradycardia and BB  is on hold. He also had hyponatremia with sodium of 129. He was placed on fluid restriction and Na+ 132 today. Will advise 48 ounce fluid restriction. He had acute kidney injury with Baseline SCr 0.8-0.9 up to 1.66, currently 1.49. He was not given lasix. ACE-I  is on hold. Will need repeat BMet early next week.   An echocardiogram was unchanged from previous, see report above.   The patient is feeling better and wants to go home.   Patient has been seen by Dr. Percival Spanish today and deemed ready for discharge home. All follow up appointments have been scheduled. Discharge medications are listed below.   09/15/17 Patient is here today for hospital follow-up.  Patient feels and looks much better.  He has diuresed and lost almost 18 lbs since last visit.  His lungs are much clearer today.  He denies sob or orthopnea.  He still has edema in his legs but this is improving.  Heartrate is elevated 90-98 bpm off of metoprolol.  He denies chest pain or palpitations.  He denies dysuria or frequency.  He is restircting fluid to 62 oz per day for hyponatremia.  At that time, my plan was: Patient had acute renal insufficiency in the hospital.  However his exam was more consistent with fluid overload.  I believe the acute renal insufficiency was due to the urinary tract infection rather than dehydration.  I will repeat a CMP today to monitor renal function.  I will also repeat a urine culture to ensure resolution of the urinary tract infection.  I will repeat a CMP today to monitor his hyponatremia.  This was treated with fluid restriction in the hospital.  Patient is holding his beta-blocker and his bradycardia has improved.  I believe his bradycardia was a combination of the beta-blocker coupled with his acute illness.  I believe the patient was possibly developing urosepsis.  Clinically he appears much better today.  Recheck heart rate next week and resume metoprolol if persistently > 90.  There is no evidence of fluid overload on exam.  His bradycardia has improved.  His lungs are clear.  He denies any chest pain or shortness of breath.  Therefore I will not make any changes in his medication today.  Repeat urine cx.      09/22/17 Patient continues to feel stronger and  better since his hospitalization. His heart rate today is elevated at 92 bpm. He continues to hold his beta blocker. I reviewed his most recent visit with his cardiologist and they were planning to have the patient wear a Holter monitor to determine his average heart rate. The plan tentatively is to resume his metoprolol at a reduced dose if he has tachycardia confirmed as well as significant bradycardia rule out by the Holter monitor. He is already returned the Holter monitor and is waiting to hear back from the cardiologist. He denies any chest pain shortness of breath syncope or lightheadedness. His recent urine culture was negative. He continues to have pitting edema in both legs however he continues to lose weight from diuresis.  At that time, my plan was: Continue his current medications at the present dose. I would like to resume the patient on a low-dose beta blocker such as metoprolol 12.5 mg by mouth twice a day similar to that recommended by his cardiologist. However I will defer to their judgment. I have asked the patient to call me next week if he has not yet heard back from the cardiologist regarding the  results of his Holter monitor. Unless the patient has significant bradycardia, I believe he would benefit from a low-dose beta blocker given his elevated resting heart rate. Continue Lasix at his current dose. Lungs sound completely clear today. There is no evidence of dehydration.  10/12/17 Patient is here today for follow-up.  I reviewed his Holter monitor results with the patient.  His maximum heart rate was 147 bpm.  His minimum heart rate was 55 bpm.  His average heart rate was 95 bpm.  Today he is 96-100 bpm depending upon the exam due to the fact he is in atrial fibrillation with an irregular heart rhythm.  He does report feeling episodes of tachycardia at home.  He denies any chest pain.  He denies any shortness of breath.  He denies any syncope or near syncope.  At that time, my plan  was: Resume metoprolol XL 25 mg a day.  He can take his 50 mg tablets and break them in half.  Ideally I would like his resting heart rate between 60 and 70 bpm.  He is averaging between 90 and 100 bpm.  Recheck the patient in 2 weeks or sooner if symptoms develop.  Monitor for bradycardia.  10/26/17 Patient is here today to recheck his heart rate since resuming Toprol-XL 25 mg a day.  Heart rate is now in the 70s-80s.  He denies any syncope or near syncope.  He denies any tachycardia.  He denies any episodes of bradycardia.  He is occasionally checking his heart rate.  He seems to be doing better and feeling better now that his heart rate is lower.  He denies any shortness of breath or dyspnea on exertion.  His edema has resolved.  His weight is stable.  Previously he had been diuresing and his weight have been dropping every visit but it is now stabilized.  There is no pitting edema in his legs and his lungs are clear.  At that time, my plan was: I believe the current dose of metoprolol is the appropriate dose for this patient.  He is to continue 25 mg a day which is appropriately controlling his heart rate.  Patient is anticoagulated.  There is no evidence of congestive heart failure.  Therefore I recommended rechecking the patient for routine follow-up in 6 months or sooner if symptoms develop.  04/27/18 Patient is here today for follow-up.  His heart rate is regular today on exam.  He appears to be in normal sinus rhythm.  His heart rate is between 80 and 90 beats on exam.  He denies any chest pain shortness of breath or dyspnea on exertion.  He denies any peripheral edema.  He denies any orthopnea.  He denies any gasping for air in the middle the night.  He ultimately decided not to proceed with his next colonoscopy due to his age and life expectancy.  He denies any melena or hematochezia.  Overall he is doing well with no concerns.  He is here today just for regular follow-up.  He denies any recent  illnesses.  At that time, my plan was: Patient does not appear fluid overloaded on exam today.  He is asymptomatic denying any dyspnea or orthopnea.  Heart rate is borderline elevated in the 90s.  I have asked the patient to monitor his heart rate closely at home.  If his but heart rate remains 90-100, I will consider increasing Toprol-XL to 50 mg a day.  However we need to do this cautiously given  the fact the patient has significant bradycardia prompting hospitalization less than a year ago.  Therefore I will make no changes in his medication at this time.  I will check a CBC to monitor for anemia and a CMP.  08/10/18 Since I last saw the patient, he has gained approximately 6 pounds.  He reports more swelling in his legs.  He also has bibasilar crackles pulmonary exam.  However he denies any orthopnea or dyspnea on exertion or chest pain or shortness of breath beyond his baseline.  However he does believe that he swelling more than previous.  He is taking 80 mg of Lasix in the morning and he milligrams of Lasix in the evening.  He denies eating more sodium recently.  He denies any palpitations or chest pain.  He denies any syncope or near syncope.  He denies any tachycardia.  Heart rate today is well controlled in the 80s although he is in atrial fibrillation this morning.  Compliant with his anticoagulation/Xarelto  Past Medical History:  Diagnosis Date  . Anxiety   . Arthritis    "little bit; left knee"  . Atrial fibrillation (HCC)    Chronic  . Basal cell carcinoma   . Cataracts, bilateral    to have left eye cataract surgery on 03/21/17   . CHF (congestive heart failure), NYHA class II, chronic, diastolic ([place])   . Colon polyps    last colonoscopy 2015 (tubular adenoma)  . Depression   . Diverticulosis   . Gastritis   . GERD (gastroesophageal reflux disease)   . H/O: GI bleed    Recurrent  . Hemorrhoids   . History of blood transfusion   . History of BPH   . History of kidney stones      hx of x 1   . Hypertension   . Pernicious anemia   . PVC (premature ventricular contraction)   . Rectal bleeding 02/15/12  . Vitamin D deficiency    Past Surgical History:  Procedure Laterality Date  . COLONOSCOPY WITH PROPOFOL N/A 03/17/2017   Procedure: COLONOSCOPY WITH PROPOFOL;  Surgeon: Carol Ada, MD;  Location: [facility];  Service: Endoscopy;  Laterality: N/A;  . [place]; ~ 2002   left; right   Current Outpatient Medications on File Prior to Visit  Medication Sig Dispense Refill  . acetaminophen (TYLENOL) 500 MG tablet Take 1,000 mg by mouth every 6 (six) hours as needed for moderate pain.    Marland Kitchen amLODipine (NORVASC) 10 MG tablet TAKE 1 TABLET (10 MG TOTAL) BY MOUTH DAILY. 90 tablet 3  . cloNIDine (CATAPRES) 0.1 MG tablet TAKE 1 TABLET AT BEDTIME 90 tablet 3  . doxazosin (CARDURA) 4 MG tablet TAKE 1 TABLET EVERY DAY 90 tablet 3  . ferrous sulfate 325 (65 FE) MG tablet Take 325 mg by mouth every other day.     . fluticasone (FLONASE) 50 MCG/ACT nasal spray PLACE 1 SPRAY INTO BOTH NOSTRILS DAILY. 16 g 5  . furosemide (LASIX) 40 MG tablet TAKE 2 TABLETS (80MG ) EVERY MORNING AND 1 TABLET (40MG ) EVERY EVENING 270 tablet 3  . metoprolol succinate (TOPROL-XL) 25 MG 24 hr tablet Take 1 tablet (25 mg total) by mouth daily. 90 tablet 3  . Multiple Vitamin (MULTIVITAMIN) tablet Take 1 tablet by mouth daily.    . multivitamin-lutein (OCUVITE-LUTEIN) CAPS Take 1 capsule by mouth daily.    . potassium chloride (K-DUR) 10 MEQ tablet TAKE 1 TABLET EVERY DAY 90 tablet 3  .  sulfacetamide (BLEPH-10) 10 % ophthalmic solution Place 1 drop into both eyes every 3 (three) hours. 15 mL 0  . VENTOLIN HFA 108 (90 Base) MCG/ACT inhaler INHALE 2 PUFFS BY MOUTH EVERY 6 HOURS AS NEEDED FOR WHEEZING OR SHORTNESS OF BREATH 18 Inhaler 0  . XARELTO 20 MG TABS tablet TAKE 1 TABLET EVERY DAY WITH SUPPER 90 tablet 3   Current Facility-Administered Medications on File Prior to Visit   Medication Dose Route Frequency Provider Last Rate Last Dose  . cyanocobalamin ((VITAMIN B-12)) injection 1,000 mcg  1,000 mcg Intramuscular Q30 days [name], MD   1,000 mcg at 07/03/18 1140   No Known Allergies Social History   Socioeconomic History  . Marital status: Widowed    Spouse name: Not on file  . Number of children: 1  . Years of education: Not on file  . Highest education level: Not on file  Occupational History  . Occupation: Retired  Scientific laboratory technician  . Financial resource strain: Not on file  . Food insecurity:    Worry: Not on file    Inability: Not on file  . Transportation needs:    Medical: Not on file    Non-medical: Not on file  Tobacco Use  . Smoking status: Never Smoker  . Smokeless tobacco: Former Systems developer    Types: Snuff  . Tobacco comment: 02/15/12 "still use snuff @ times; not daily"  Substance and Sexual Activity  . Alcohol use: No  . Drug use: No  . Sexual activity: Never    Comment: widower, lives with granddaughter and great grandchildren.  Lifestyle  . Physical activity:    Days per week: Not on file    Minutes per session: Not on file  . Stress: Not on file  Relationships  . Social connections:    Talks on phone: Not on file    Gets together: Not on file    Attends religious service: Not on file    Active member of club or organization: Not on file    Attends meetings of clubs or organizations: Not on file    Relationship status: Not on file  . Intimate partner violence:    Fear of current or ex partner: Not on file    Emotionally abused: Not on file    Physically abused: Not on file    Forced sexual activity: Not on file  Other Topics Concern  . Not on file  Social History Narrative   Lives with grand daughter.         Epworth Sleepiness Scale = 7 (as of 09/21/2015)      Review of Systems  All other systems reviewed and are negative.      Objective:   Physical Exam  Constitutional: He appears well-developed and  well-nourished.  Non-toxic appearance. He does not have a sickly appearance. He does not appear ill.  HENT:  Right Ear: External ear normal.  Left Ear: External ear normal.  Nose: Nose normal.  Mouth/Throat: Oropharynx is clear and moist.  Neck: Neck supple. No JVD present.  Cardiovascular: Normal rate, regular rhythm and normal heart sounds.  Pulmonary/Chest: Effort normal. No respiratory distress. He has no wheezes. He has rales. He exhibits no tenderness.  Abdominal: Soft. Bowel sounds are normal. He exhibits no distension.  Musculoskeletal:        General: Edema present.  Lymphadenopathy:    He has no cervical adenopathy.  Assessment & Plan:  Paroxysmal atrial fibrillation (HCC)  Chronic diastolic heart failure ([place]) - Plan: CBC with Differential/Platelet, COMPLETE METABOLIC PANEL WITH GFR  Elevated bilirubin - Plan: COMPLETE METABOLIC PANEL WITH GFR  Vitamin B12 deficiency - Plan: Vitamin B12  Physical exam suggest that the patient is retaining fluid.  I recommended temporarily increasing his Lasix to 80 mg twice daily for 3 days and then having the patient begin checking his weights daily and notify me if there have been any fluctuations greater than 2 to 3 pounds in 24-hour.  Or if he sees his weight gradually trending up again.  Patient's blood pressure and heart rate are well controlled.  He is appropriately anticoagulated.  I will check a CMP to monitor his bilirubin as well as a CBC to monitor his anemia.  Patient received his vitamin B12 injection today and I will check a B12 level.

## 2018-08-16 LAB — CBC WITH DIFFERENTIAL/PLATELET
Absolute Monocytes: 490 cells/uL (ref 200–950)
Basophils Absolute: 50 cells/uL (ref 0–200)
Basophils Relative: 0.7 %
EOS PCT: 1.4 %
Eosinophils Absolute: 99 cells/uL (ref 15–500)
HCT: 39.9 % (ref 38.5–50.0)
Hemoglobin: 13.1 g/dL — ABNORMAL LOW (ref 13.2–17.1)
Lymphs Abs: 1477 cells/uL (ref 850–3900)
MCH: 28.1 pg (ref 27.0–33.0)
MCHC: 32.8 g/dL (ref 32.0–36.0)
MCV: 85.4 fL (ref 80.0–100.0)
MPV: 12.8 fL — ABNORMAL HIGH (ref 7.5–12.5)
Monocytes Relative: 6.9 %
NEUTROS PCT: 70.2 %
Neutro Abs: 4984 cells/uL (ref 1500–7800)
PLATELETS: 131 10*3/uL — AB (ref 140–400)
RBC: 4.67 10*6/uL (ref 4.20–5.80)
RDW: 13.6 % (ref 11.0–15.0)
TOTAL LYMPHOCYTE: 20.8 %
WBC: 7.1 10*3/uL (ref 3.8–10.8)

## 2018-08-16 LAB — COMPLETE METABOLIC PANEL WITH GFR
AG Ratio: 1.7 (calc) (ref 1.0–2.5)
ALKALINE PHOSPHATASE (APISO): 77 U/L (ref 40–115)
ALT: 18 U/L (ref 9–46)
AST: 26 U/L (ref 10–35)
Albumin: 4.2 g/dL (ref 3.6–5.1)
BILIRUBIN TOTAL: 1.7 mg/dL — AB (ref 0.2–1.2)
BUN: 8 mg/dL (ref 7–25)
CHLORIDE: 102 mmol/L (ref 98–110)
CO2: 28 mmol/L (ref 20–32)
Calcium: 9.6 mg/dL (ref 8.6–10.3)
Creat: 0.91 mg/dL (ref 0.70–1.11)
GFR, Est African American: 89 mL/min/{1.73_m2} (ref >=60)
GFR, Est Non African American: 77 mL/min/{1.73_m2} (ref >=60)
GLUCOSE: 124 mg/dL — AB (ref 65–99)
Globulin: 2.5 g/dL (calc) (ref 1.9–3.7)
Potassium: 3.6 mmol/L (ref 3.5–5.3)
Sodium: 141 mmol/L (ref 135–146)
Total Protein: 6.7 g/dL (ref 6.1–8.1)

## 2018-08-16 LAB — TEST AUTHORIZATION

## 2018-08-16 LAB — VITAMIN B12: Vitamin B-12: >2000 pg/mL — ABNORMAL HIGH (ref 200–1100)

## 2018-08-16 LAB — HEMOGLOBIN A1C W/OUT EAG: HEMOGLOBIN A1C: 6.1 %{Hb} — AB (ref <5.7)

## 2018-09-14 ENCOUNTER — Other Ambulatory Visit: Payer: Self-pay | Admitting: Family Medicine

## 2018-09-17 ENCOUNTER — Ambulatory Visit (INDEPENDENT_AMBULATORY_CARE_PROVIDER_SITE_OTHER): Payer: Medicare HMO

## 2018-09-17 DIAGNOSIS — E538 Deficiency of other specified B group vitamins: Secondary | ICD-10-CM

## 2018-09-17 DIAGNOSIS — D519 Vitamin B12 deficiency anemia, unspecified: Secondary | ICD-10-CM

## 2018-09-17 NOTE — Progress Notes (Signed)
Patient came in today to receive monthly B12 injection. Patient received injection in the right deltoid. He tolerated well. Adivsed to return in 30 days.

## 2018-09-27 NOTE — Progress Notes (Signed)
Cardiology Office Note   Date:  09/28/2018   ID:  Austin Mesi., DOB 1932-03-20, MRN [id number]  PCP:  [name], MD  Cardiologist:   Minus Breeding, MD   Chief Complaint  Patient presents with  . Atrial Fibrillation      History of Present Illness: Austin Montfort. is a 85 y.o. male who presents for evaluation of atrial fib and acute on chronic systolic and diastolic HF.  He was admitted on 09/05/2017 after he presented to his PCPs office with bradycardia and symptoms of volume overload.  Initial lab work also suggested acute kidney injury with creatinine elevation up to 1.5.  His heart rate was in the 30s in the ER.  His beta-blocker was held during the admission.  His ACE inhibitor was discontinued.  Echocardiogram obtained on 09/06/2017 showed mild LVH, EF 55-60%, moderate MR, moderate TR, PA peak pressure 36 mmHg.  He had normalization of the renal function by 09/15/2017.    Since we last saw him he has been doing okay.  He seems to be in fibrillation today but he is not noticing this.  He is not having any palpitations, presyncope or syncope.  He does some cooking around the house.  He does some light housework.  He denies any new cardiovascular symptoms.  He thinks his weights have been very stable in the 242 to 244 pounds rate.  This was his previous discharge weight.  He denies any chest pressure, neck or arm discomfort.  He has had no PND or orthopnea.   Past Medical History:  Diagnosis Date  . Anxiety   . Arthritis    "little bit; left knee"  . Atrial fibrillation (HCC)    Chronic  . Basal cell carcinoma   . Cataracts, bilateral    to have left eye cataract surgery on 03/21/17   . CHF (congestive heart failure), NYHA class II, chronic, diastolic ([place])   . Colon polyps    last colonoscopy 2015 (tubular adenoma)  . Depression   . Diverticulosis   . Gastritis   . GERD (gastroesophageal reflux disease)   . H/O: GI bleed    Recurrent  . Hemorrhoids   .  History of blood transfusion   . History of BPH   . History of kidney stones    hx of x 1   . Hypertension   . Pernicious anemia   . PVC (premature ventricular contraction)   . Rectal bleeding 02/15/12  . Vitamin D deficiency     Past Surgical History:  Procedure Laterality Date  . COLONOSCOPY WITH PROPOFOL N/A 03/17/2017   Procedure: COLONOSCOPY WITH PROPOFOL;  Surgeon: Carol Ada, MD;  Location: [facility];  Service: Endoscopy;  Laterality: N/A;  . [place]; ~ 2002   left; right     Current Outpatient Medications  Medication Sig Dispense Refill  . amLODipine (NORVASC) 10 MG tablet TAKE 1 TABLET (10 MG TOTAL) BY MOUTH DAILY. 90 tablet 3  . cloNIDine (CATAPRES) 0.1 MG tablet Take 1 tablet (0.1 mg total) by mouth 2 (two) times daily. 180 tablet 3  . doxazosin (CARDURA) 4 MG tablet TAKE 1 TABLET EVERY DAY 90 tablet 3  . ferrous sulfate 325 (65 FE) MG tablet Take 325 mg by mouth every other day.     . fluticasone (FLONASE) 50 MCG/ACT nasal spray PLACE 1 SPRAY INTO BOTH NOSTRILS DAILY. 16 g 5  . furosemide (LASIX) 40 MG tablet TAKE 2  TABLETS (80MG ) EVERY MORNING AND 1 TABLET (40MG ) EVERY EVENING 270 tablet 3  . metoprolol succinate (TOPROL-XL) 25 MG 24 hr tablet Take 1 tablet (25 mg total) by mouth daily. 90 tablet 3  . Multiple Vitamin (MULTIVITAMIN) tablet Take 1 tablet by mouth daily.    . multivitamin-lutein (OCUVITE-LUTEIN) CAPS Take 1 capsule by mouth daily.    . potassium chloride (K-DUR) 10 MEQ tablet TAKE 1 TABLET EVERY DAY 90 tablet 3  . VENTOLIN HFA 108 (90 Base) MCG/ACT inhaler INHALE 2 PUFFS BY MOUTH EVERY 6 HOURS AS NEEDED FOR WHEEZING OR SHORTNESS OF BREATH 18 Inhaler 0  . XARELTO 20 MG TABS tablet TAKE 1 TABLET EVERY DAY WITH SUPPER 90 tablet 3   Current Facility-Administered Medications  Medication Dose Route Frequency Provider Last Rate Last Dose  . cyanocobalamin ((VITAMIN B-12)) injection 1,000 mcg  1,000 mcg Intramuscular Q30 days [name], MD   1,000 mcg at 09/17/18 6734    Allergies:   Patient has no known allergies.    ROS:  Please see the history of present illness.   Otherwise, review of systems are positive for none.   All other systems are reviewed and negative.    PHYSICAL EXAM: VS:  BP (!) 168/76   Pulse 94   Ht 5\' 8"  (1.727 m)   Wt 245 lb 3.2 oz (111.2 kg)   BMI 37.28 kg/m  , BMI Body mass index is 37.28 kg/m.  GENERAL:  Well appearing NECK:  No jugular venous distention, waveform within normal limits, carotid upstroke brisk and symmetric, no bruits, no thyromegaly LUNGS:  Clear to auscultation bilaterally CHEST:  Unremarkable HEART:  PMI not displaced or sustained,S1 and S2 within normal limits,  no S4, no clicks, no rubs, no murmurs, irregular ABD:  Flat, positive bowel sounds normal in frequency in pitch, no bruits, no rebound, no guarding, no midline pulsatile mass, no hepatomegaly, no splenomegaly EXT:  2 plus pulses throughout, trace edema, no cyanosis no clubbing  EKG:  EKG is  ordered today. Atrial fibrillation, right bundle branch block, no acute ST-T wave changes.  Recent Labs: 08/10/2018: ALT 18; BUN 8; Creat 0.91; Hemoglobin 13.1; Platelets 131; Potassium 3.6; Sodium 141    Lipid Panel    Component Value Date/Time   CHOL 176 04/08/2016 0823   TRIG 57 04/08/2016 0823   HDL 64 04/08/2016 0823   CHOLHDL 2.8 04/08/2016 0823   VLDL 11 04/08/2016 0823   LDLCALC 101 04/08/2016 0823      Wt Readings from Last 3 Encounters:  09/28/18 245 lb 3.2 oz (111.2 kg)  08/10/18 251 lb (113.9 kg)  04/27/18 245 lb (111.1 kg)      Other studies Reviewed: Additional studies/ records that were reviewed today include:  Labs Review of the above records demonstrates:     ASSESSMENT AND PLAN:  ATRIAL FIB:    CHA2DS2-VASc Score and unadjusted Ischemic Stroke Rate (% per year) is equal to 3.2 % stroke rate/year from a score of 3.   He had good rate control last year.   Continue current therapy.    MR:   This was moderate last year.   I will follow this with repeat echo probably after his next appt this year. =  EDEMA:  This is mild and he is doing a good job watching this.  No change in therapy.  OBESITY:    He understands the need to lose weight with diet and exercise.  HTN:   The blood  pressure is elevated.  I am getting increase his clonidine to 0.1 mg twice daily.   CHRONIC DIASTOLIC HF:   He seems to be euvolemic.  Compliant with his weights.  He seems to be compliant with medication salt.  He will continue the meds as listed.   Current medicines are reviewed at length with the patient today.  The patient does not have concerns regarding medicines.  The following changes have been made:  As above   Labs/ tests ordered today include:  None  Orders Placed This Encounter  Procedures  . EKG 12-Lead     Disposition:   FU with me in [place] in 6 months.    Signed, Minus Breeding, MD  09/28/2018 10:01 AM    [place]

## 2018-09-28 ENCOUNTER — Encounter: Payer: Self-pay | Admitting: Cardiology

## 2018-09-28 ENCOUNTER — Other Ambulatory Visit: Payer: Self-pay

## 2018-09-28 ENCOUNTER — Ambulatory Visit: Payer: Medicare HMO | Admitting: Cardiology

## 2018-09-28 VITALS — BP 168/76 | HR 94 | Ht 68.0 in | Wt 245.2 lb

## 2018-09-28 DIAGNOSIS — I10 Essential (primary) hypertension: Secondary | ICD-10-CM

## 2018-09-28 DIAGNOSIS — I340 Nonrheumatic mitral (valve) insufficiency: Secondary | ICD-10-CM

## 2018-09-28 DIAGNOSIS — I5032 Chronic diastolic (congestive) heart failure: Secondary | ICD-10-CM

## 2018-09-28 DIAGNOSIS — E785 Hyperlipidemia, unspecified: Secondary | ICD-10-CM

## 2018-09-28 DIAGNOSIS — I480 Paroxysmal atrial fibrillation: Secondary | ICD-10-CM | POA: Diagnosis not present

## 2018-09-28 MED ORDER — CLONIDINE HCL 0.1 MG PO TABS: 0.1000 mg | ORAL_TABLET | Freq: Two times a day (BID) | ORAL | 3 refills | Status: DC

## 2018-09-28 NOTE — Patient Instructions (Signed)
Medication Instructions:  INCREASE- Clonidine 0.1 mg twice day  If you need a refill on your cardiac medications before your next appointment, please call your pharmacy.  Labwork: None Ordered   Testing/Procedures: None Ordered   Follow-Up: You will need a follow up appointment in 6 months.  Please call our office 2 months in advance to schedule this appointment.  You may see Minus Breeding, MD or one of the following Advanced Practice Providers on your designated Care Team:   Rosaria Ferries, PA-C . Jory Sims, DNP, ANP     At [facility], you and your health needs are our priority.  As part of our continuing mission to provide you with exceptional heart care, we have created designated Provider Care Teams.  These Care Teams include your primary Cardiologist (physician) and Advanced Practice Providers (APPs -  Physician Assistants and Nurse Practitioners) who all work together to provide you with the care you need, when you need it.  Thank you for choosing [facility] at [facility]!!

## 2018-11-21 ENCOUNTER — Other Ambulatory Visit: Payer: Self-pay | Admitting: Family Medicine

## 2018-11-25 ENCOUNTER — Other Ambulatory Visit: Payer: Self-pay | Admitting: Family Medicine

## 2018-11-26 ENCOUNTER — Other Ambulatory Visit: Payer: Self-pay | Admitting: Family Medicine

## 2018-11-26 DIAGNOSIS — R635 Abnormal weight gain: Secondary | ICD-10-CM

## 2018-11-26 DIAGNOSIS — R0600 Dyspnea, unspecified: Secondary | ICD-10-CM

## 2018-12-05 ENCOUNTER — Other Ambulatory Visit: Payer: Self-pay | Admitting: Family Medicine

## 2018-12-05 MED ORDER — METOPROLOL SUCCINATE ER 25 MG PO TB24: 25.0000 mg | ORAL_TABLET | Freq: Every day | ORAL | 3 refills | Status: DC

## 2019-01-18 ENCOUNTER — Other Ambulatory Visit: Payer: Self-pay | Admitting: Family Medicine

## 2019-01-21 ENCOUNTER — Encounter (INDEPENDENT_AMBULATORY_CARE_PROVIDER_SITE_OTHER): Payer: Medicare HMO | Admitting: Ophthalmology

## 2019-01-24 ENCOUNTER — Ambulatory Visit: Payer: Self-pay | Admitting: Family Medicine

## 2019-01-28 ENCOUNTER — Other Ambulatory Visit: Payer: Self-pay

## 2019-01-28 ENCOUNTER — Ambulatory Visit (INDEPENDENT_AMBULATORY_CARE_PROVIDER_SITE_OTHER): Payer: Medicare HMO | Admitting: Family Medicine

## 2019-01-28 VITALS — BP 160/74 | HR 60 | Temp 97.8°F | Resp 20 | Ht 68.0 in | Wt 254.0 lb

## 2019-01-28 DIAGNOSIS — E538 Deficiency of other specified B group vitamins: Secondary | ICD-10-CM

## 2019-01-28 DIAGNOSIS — D519 Vitamin B12 deficiency anemia, unspecified: Secondary | ICD-10-CM

## 2019-01-28 DIAGNOSIS — R001 Bradycardia, unspecified: Secondary | ICD-10-CM

## 2019-01-28 NOTE — Progress Notes (Signed)
Subjective:    Patient ID: Austin Mesi., male    DOB: [birth year], 85 y.o.   MRN: [id number]  Patient has a history of atrial fibrillation.  He is recently been experiencing bradycardia.  His granddaughter who is with him states that his heart rate is in the 40s.  It seldom rises above 50.  They are checking his heart rate with a pulse oximeter.  He denies any syncope.  He denies any lightheadedness.  He denies any weakness or dizziness.  He denies any chest pain or shortness of breath.  He does have +1 pitting edema in both legs distal to the knee but he denies any dyspnea.  He denies any cough orthopnea.  His lungs are clear to auscultation bilaterally.  Today he has an irregularly irregular rhythm consistent with A. fib.  His heart rate is in the 60s however.  Patient discontinued Toprol on his own 2 weeks ago and the bradycardia has not improved  Past Medical History:  Diagnosis Date  . Anxiety   . Arthritis    "little bit; left knee"  . Atrial fibrillation (HCC)    Chronic  . Basal cell carcinoma   . Cataracts, bilateral    to have left eye cataract surgery on 03/21/17   . CHF (congestive heart failure), NYHA class II, chronic, diastolic ([place])   . Colon polyps    last colonoscopy 2015 (tubular adenoma)  . Depression   . Diverticulosis   . Gastritis   . GERD (gastroesophageal reflux disease)   . H/O: GI bleed    Recurrent  . Hemorrhoids   . History of blood transfusion   . History of BPH   . History of kidney stones    hx of x 1   . Hypertension   . Pernicious anemia   . PVC (premature ventricular contraction)   . Rectal bleeding 02/15/12  . Vitamin D deficiency    Past Surgical History:  Procedure Laterality Date  . COLONOSCOPY WITH PROPOFOL N/A 03/17/2017   Procedure: COLONOSCOPY WITH PROPOFOL;  Surgeon: Carol Ada, MD;  Location: [facility];  Service: Endoscopy;  Laterality: N/A;  . [place]; ~ 2002   left; right   Current Outpatient  Medications on File Prior to Visit  Medication Sig Dispense Refill  . amLODipine (NORVASC) 10 MG tablet TAKE 1 TABLET (10 MG TOTAL) BY MOUTH DAILY. 90 tablet 1  . cloNIDine (CATAPRES) 0.1 MG tablet Take 1 tablet (0.1 mg total) by mouth 2 (two) times daily. 180 tablet 3  . doxazosin (CARDURA) 4 MG tablet TAKE 1 TABLET EVERY DAY 90 tablet 1  . ferrous sulfate 325 (65 FE) MG tablet Take 325 mg by mouth every other day.     . fluticasone (FLONASE) 50 MCG/ACT nasal spray PLACE 1 SPRAY INTO BOTH NOSTRILS DAILY. 16 g 5  . furosemide (LASIX) 40 MG tablet TAKE 2 TABLETS (80MG ) EVERY MORNING AND 1 TABLET (40MG ) EVERY EVENING 270 tablet 3  . Multiple Vitamin (MULTIVITAMIN) tablet Take 1 tablet by mouth daily.    . multivitamin-lutein (OCUVITE-LUTEIN) CAPS Take 1 capsule by mouth daily.    . potassium chloride (K-DUR) 10 MEQ tablet TAKE 1 TABLET EVERY DAY 90 tablet 3  . VENTOLIN HFA 108 (90 Base) MCG/ACT inhaler INHALE 2 PUFFS BY MOUTH EVERY 6 HOURS AS NEEDED FOR WHEEZING OR SHORTNESS OF BREATH 18 Inhaler 0  . XARELTO 20 MG TABS tablet TAKE 1 TABLET EVERY DAY WITH SUPPER 90 tablet  3  . metoprolol succinate (TOPROL-XL) 25 MG 24 hr tablet Take 1 tablet (25 mg total) by mouth daily. (Patient not taking: Reported on 01/28/2019) 90 tablet 3   Current Facility-Administered Medications on File Prior to Visit  Medication Dose Route Frequency Provider Last Rate Last Dose  . cyanocobalamin ((VITAMIN B-12)) injection 1,000 mcg  1,000 mcg Intramuscular Q30 days [name], MD   1,000 mcg at 09/17/18 4034   No Known Allergies Social History   Socioeconomic History  . Marital status: Widowed    Spouse name: Not on file  . Number of children: 1  . Years of education: Not on file  . Highest education level: Not on file  Occupational History  . Occupation: Retired  Scientific laboratory technician  . Financial resource strain: Not on file  . Food insecurity    Worry: Not on file    Inability: Not on file  . Transportation  needs    Medical: Not on file    Non-medical: Not on file  Tobacco Use  . Smoking status: Never Smoker  . Smokeless tobacco: Former Systems developer    Types: Snuff  . Tobacco comment: 02/15/12 "still use snuff @ times; not daily"  Substance and Sexual Activity  . Alcohol use: No  . Drug use: No  . Sexual activity: Never    Comment: widower, lives with granddaughter and great grandchildren.  Lifestyle  . Physical activity    Days per week: Not on file    Minutes per session: Not on file  . Stress: Not on file  Relationships  . Social Herbalist on phone: Not on file    Gets together: Not on file    Attends religious service: Not on file    Active member of club or organization: Not on file    Attends meetings of clubs or organizations: Not on file    Relationship status: Not on file  . Intimate partner violence    Fear of current or ex partner: Not on file    Emotionally abused: Not on file    Physically abused: Not on file    Forced sexual activity: Not on file  Other Topics Concern  . Not on file  Social History Narrative   Lives with grand daughter.         Epworth Sleepiness Scale = 7 (as of 09/21/2015)      Review of Systems  All other systems reviewed and are negative.      Objective:   Physical Exam  Constitutional: He appears well-developed and well-nourished.  Non-toxic appearance. He does not have a sickly appearance. He does not appear ill.  HENT:  Right Ear: External ear normal.  Left Ear: External ear normal.  Nose: Nose normal.  Mouth/Throat: Oropharynx is clear and moist.  Neck: Neck supple. No JVD present.  Cardiovascular: Normal rate, regular rhythm and normal heart sounds.  Pulmonary/Chest: Effort normal. No respiratory distress. He has no wheezes. He has no rales. He exhibits no tenderness.  Abdominal: Soft. Bowel sounds are normal. He exhibits no distension.  Musculoskeletal:        General: Edema present.  Lymphadenopathy:    He has no  cervical adenopathy.               Assessment & Plan:  1. Bradycardia Patient has stopped Toprol-XL on his own.  He is asymptomatic but his heart rate is in the 40s to 50s.  I have asked the patient to  reduce his clonidine to 0.1 mg p.o. every morning and monitor his heart rate and blood pressure for 1 week.  If he develops any lightheadedness, syncope, near syncope, shortness of breath or chest pain I want him to go to the hospital.  If he continues to have profound bradycardia or become symptomatic, we may need to consider referral to cardiology for pacemaker implantation. - CBC with Differential/Platelet - COMPLETE METABOLIC PANEL WITH GFR

## 2019-01-29 LAB — COMPLETE METABOLIC PANEL WITH GFR
AG Ratio: 1.9 (calc) (ref 1.0–2.5)
ALT: 17 U/L (ref 9–46)
AST: 25 U/L (ref 10–35)
Albumin: 4.2 g/dL (ref 3.6–5.1)
Alkaline phosphatase (APISO): 70 U/L (ref 35–144)
BUN: 9 mg/dL (ref 7–25)
CO2: 28 mmol/L (ref 20–32)
Calcium: 9.3 mg/dL (ref 8.6–10.3)
Chloride: 105 mmol/L (ref 98–110)
Creat: 0.97 mg/dL (ref 0.70–1.11)
GFR, Est African American: 83 mL/min/{1.73_m2} (ref >=60)
GFR, Est Non African American: 71 mL/min/{1.73_m2} (ref >=60)
Globulin: 2.2 g/dL (calc) (ref 1.9–3.7)
Glucose, Bld: 109 mg/dL — ABNORMAL HIGH (ref 65–99)
Potassium: 3.6 mmol/L (ref 3.5–5.3)
Sodium: 143 mmol/L (ref 135–146)
Total Bilirubin: 1.7 mg/dL — ABNORMAL HIGH (ref 0.2–1.2)
Total Protein: 6.4 g/dL (ref 6.1–8.1)

## 2019-01-29 LAB — CBC WITH DIFFERENTIAL/PLATELET
Absolute Monocytes: 454 cells/uL (ref 200–950)
Basophils Absolute: 32 cells/uL (ref 0–200)
Basophils Relative: 0.5 %
Eosinophils Absolute: 69 cells/uL (ref 15–500)
Eosinophils Relative: 1.1 %
HCT: 37.4 % — ABNORMAL LOW (ref 38.5–50.0)
Hemoglobin: 12.3 g/dL — ABNORMAL LOW (ref 13.2–17.1)
Lymphs Abs: 1304 cells/uL (ref 850–3900)
MCH: 28.6 pg (ref 27.0–33.0)
MCHC: 32.9 g/dL (ref 32.0–36.0)
MCV: 87 fL (ref 80.0–100.0)
MPV: 13 fL — ABNORMAL HIGH (ref 7.5–12.5)
Monocytes Relative: 7.2 %
Neutro Abs: 4442 cells/uL (ref 1500–7800)
Neutrophils Relative %: 70.5 %
Platelets: 103 10*3/uL — ABNORMAL LOW (ref 140–400)
RBC: 4.3 10*6/uL (ref 4.20–5.80)
RDW: 14.3 % (ref 11.0–15.0)
Total Lymphocyte: 20.7 %
WBC: 6.3 10*3/uL (ref 3.8–10.8)

## 2019-01-30 ENCOUNTER — Encounter: Payer: Self-pay | Admitting: Family Medicine

## 2019-02-04 ENCOUNTER — Encounter: Payer: Self-pay | Admitting: Family Medicine

## 2019-02-04 ENCOUNTER — Other Ambulatory Visit: Payer: Self-pay

## 2019-02-04 ENCOUNTER — Ambulatory Visit (INDEPENDENT_AMBULATORY_CARE_PROVIDER_SITE_OTHER): Payer: Medicare HMO | Admitting: Family Medicine

## 2019-02-04 VITALS — BP 148/84 | HR 90 | Temp 98.6°F | Resp 20 | Ht 68.0 in | Wt 249.0 lb

## 2019-02-04 DIAGNOSIS — R001 Bradycardia, unspecified: Secondary | ICD-10-CM | POA: Diagnosis not present

## 2019-02-04 DIAGNOSIS — R0600 Dyspnea, unspecified: Secondary | ICD-10-CM | POA: Diagnosis not present

## 2019-02-04 MED ORDER — LOSARTAN POTASSIUM 50 MG PO TABS: 50.0000 mg | ORAL_TABLET | Freq: Every day | ORAL | 3 refills | Status: DC

## 2019-02-04 MED ORDER — ALBUTEROL SULFATE HFA 108 (90 BASE) MCG/ACT IN AERS: INHALATION_SPRAY | RESPIRATORY_TRACT | 3 refills | Status: DC

## 2019-02-04 NOTE — Progress Notes (Signed)
Subjective:    Patient ID: Austin Mesi., male    DOB: 1931/12/27, 85 y.o.   MRN: [id number] 01/28/19 Patient has a history of atrial fibrillation.  He is recently been experiencing bradycardia.  His granddaughter who is with him states that his heart rate is in the 40s.  It seldom rises above 50.  They are checking his heart rate with a pulse oximeter.  He denies any syncope.  He denies any lightheadedness.  He denies any weakness or dizziness.  He denies any chest pain or shortness of breath.  He does have +1 pitting edema in both legs distal to the knee but he denies any dyspnea.  He denies any cough orthopnea.  His lungs are clear to auscultation bilaterally.  Today he has an irregularly irregular rhythm consistent with A. fib.  His heart rate is in the 60s however.  Patient discontinued Toprol on his own 2 weeks ago and the bradycardia has not improved.  At that time, my plan was: 1. Bradycardia Patient has stopped Toprol-XL on his own.  He is asymptomatic but his heart rate is in the 40s to 50s.  I have asked the patient to reduce his clonidine to 0.1 mg p.o. every morning and monitor his heart rate and blood pressure for 1 week.  If he develops any lightheadedness, syncope, near syncope, shortness of breath or chest pain I want him to go to the hospital.  If he continues to have profound bradycardia or become symptomatic, we may need to consider referral to cardiology for pacemaker implantation. - CBC with Differential/Platelet - COMPLETE METABOLIC PANEL WITH GFR  5/68/12 Since reducing his clonidine by 50%, the patient's heart rate has improved.  The majority of his heart rates are in the 60s to 90s.  They have only checked it one time and found it less than 60 and that was 47 this morning.  However he was asymptomatic.  He feels much better since holding the clonidine.  He is still taking the morning dose.  He denies any chest pain.  He denies any syncope.  He denies any near syncope.  He  denies any shortness of breath.  Past Medical History:  Diagnosis Date   Anxiety    Arthritis    "little bit; left knee"   Atrial fibrillation (HCC)    Chronic   Basal cell carcinoma    Cataracts, bilateral    to have left eye cataract surgery on 03/21/17    CHF (congestive heart failure), NYHA class II, chronic, diastolic (HCC)    Colon polyps    last colonoscopy 2015 (tubular adenoma)   Depression    Diverticulosis    Gastritis    GERD (gastroesophageal reflux disease)    H/O: GI bleed    Recurrent   Hemorrhoids    History of blood transfusion    History of BPH    History of kidney stones    hx of x 1    Hypertension    Pernicious anemia    PVC (premature ventricular contraction)    Rectal bleeding 02/15/12   Vitamin D deficiency    Past Surgical History:  Procedure Laterality Date   COLONOSCOPY WITH PROPOFOL N/A 03/17/2017   Procedure: COLONOSCOPY WITH PROPOFOL;  Surgeon: Carol Ada, MD;  Location: [facility];  Service: Endoscopy;  Laterality: N/A;   [place]; ~ 2002   left; right   Current Outpatient Medications on File Prior to Visit  Medication Sig  Dispense Refill   amLODipine (NORVASC) 10 MG tablet TAKE 1 TABLET (10 MG TOTAL) BY MOUTH DAILY. 90 tablet 1   cloNIDine (CATAPRES) 0.1 MG tablet Take 1 tablet (0.1 mg total) by mouth 2 (two) times daily. (Patient taking differently: Take 0.1 mg by mouth daily. ) 180 tablet 3   doxazosin (CARDURA) 4 MG tablet TAKE 1 TABLET EVERY DAY 90 tablet 1   ferrous sulfate 325 (65 FE) MG tablet Take 325 mg by mouth every other day.      fluticasone (FLONASE) 50 MCG/ACT nasal spray PLACE 1 SPRAY INTO BOTH NOSTRILS DAILY. 16 g 5   furosemide (LASIX) 40 MG tablet TAKE 2 TABLETS (80MG ) EVERY MORNING AND 1 TABLET (40MG ) EVERY EVENING 270 tablet 3   Multiple Vitamin (MULTIVITAMIN) tablet Take 1 tablet by mouth daily.     multivitamin-lutein (OCUVITE-LUTEIN) CAPS Take 1 capsule by mouth  daily.     potassium chloride (K-DUR) 10 MEQ tablet TAKE 1 TABLET EVERY DAY 90 tablet 3   XARELTO 20 MG TABS tablet TAKE 1 TABLET EVERY DAY WITH SUPPER 90 tablet 3   Current Facility-Administered Medications on File Prior to Visit  Medication Dose Route Frequency Provider Last Rate Last Dose   cyanocobalamin ((VITAMIN B-12)) injection 1,000 mcg  1,000 mcg Intramuscular Q30 days [name], MD   1,000 mcg at 01/28/19 1142   No Known Allergies Social History   Socioeconomic History   Marital status: Widowed    Spouse name: Not on file   Number of children: 1   Years of education: Not on file   Highest education level: Not on file  Occupational History   Occupation: Retired  Scientist, product/process development strain: Not on file   Food insecurity    Worry: Not on file    Inability: Not on Lexicographer needs    Medical: Not on file    Non-medical: Not on file  Tobacco Use   Smoking status: Never Smoker   Smokeless tobacco: Former Systems developer    Types: Snuff   Tobacco comment: 02/15/12 "still use snuff @ times; not daily"  Substance and Sexual Activity   Alcohol use: No   Drug use: No   Sexual activity: Never    Comment: widower, lives with granddaughter and great grandchildren.  Lifestyle   Physical activity    Days per week: Not on file    Minutes per session: Not on file   Stress: Not on file  Relationships   Social connections    Talks on phone: Not on file    Gets together: Not on file    Attends religious service: Not on file    Active member of club or organization: Not on file    Attends meetings of clubs or organizations: Not on file    Relationship status: Not on file   Intimate partner violence    Fear of current or ex partner: Not on file    Emotionally abused: Not on file    Physically abused: Not on file    Forced sexual activity: Not on file  Other Topics Concern   Not on file  Social History Narrative   Lives with grand  daughter.         Epworth Sleepiness Scale = 7 (as of 09/21/2015)      Review of Systems  All other systems reviewed and are negative.      Objective:   Physical Exam  Constitutional: He appears well-developed  and well-nourished.  Non-toxic appearance. He does not have a sickly appearance. He does not appear ill.  HENT:  Right Ear: External ear normal.  Left Ear: External ear normal.  Nose: Nose normal.  Mouth/Throat: Oropharynx is clear and moist.  Neck: Neck supple. No JVD present.  Cardiovascular: Normal rate and normal heart sounds. An irregularly irregular rhythm present.  Pulmonary/Chest: Effort normal. No respiratory distress. He has no wheezes. He has no rales. He exhibits no tenderness.  Abdominal: Soft. Bowel sounds are normal. He exhibits no distension.  Musculoskeletal:        General: Edema present.  Lymphadenopathy:    He has no cervical adenopathy.               Assessment & Plan:   The primary encounter diagnosis was Bradycardia. A diagnosis of Dyspnea, unspecified type was also pertinent to this visit. Bradycardia has improved.  We will discontinue clonidine altogether.  However his blood pressure will then rise higher so I will start the patient on losartan 50 mg a day to compensate for the rise in his blood pressure since holding the clonidine.  If he continues to experience bradycardia I would next stop amlodipine and consult cardiology for evaluation for possible pacemaker placement however he is doing much better this week and therefore I am optimistic that hopefully we can manage this just the medication changes.

## 2019-03-07 ENCOUNTER — Encounter (INDEPENDENT_AMBULATORY_CARE_PROVIDER_SITE_OTHER): Payer: Medicare HMO | Admitting: Ophthalmology

## 2019-03-10 ENCOUNTER — Emergency Department (HOSPITAL_COMMUNITY): Payer: Medicare HMO

## 2019-03-10 ENCOUNTER — Inpatient Hospital Stay (HOSPITAL_COMMUNITY)
Admission: EM | Admit: 2019-03-10 | Discharge: 2019-03-14 | DRG: 242 | Disposition: A | Discharge status: Home Health | Payer: Medicare HMO | Attending: Cardiology | Admitting: Cardiology

## 2019-03-10 ENCOUNTER — Other Ambulatory Visit: Payer: Self-pay

## 2019-03-10 ENCOUNTER — Encounter (HOSPITAL_COMMUNITY)
Admission: EM | Disposition: A | Discharge status: Home Health | Payer: Self-pay | Source: Home / Self Care | Attending: Cardiology

## 2019-03-10 DIAGNOSIS — J90 Pleural effusion, not elsewhere classified: Secondary | ICD-10-CM | POA: Diagnosis not present

## 2019-03-10 DIAGNOSIS — Z20828 Contact with and (suspected) exposure to other viral communicable diseases: Secondary | ICD-10-CM | POA: Diagnosis present

## 2019-03-10 DIAGNOSIS — Z8719 Personal history of other diseases of the digestive system: Secondary | ICD-10-CM | POA: Diagnosis not present

## 2019-03-10 DIAGNOSIS — I4891 Unspecified atrial fibrillation: Secondary | ICD-10-CM | POA: Diagnosis not present

## 2019-03-10 DIAGNOSIS — H269 Unspecified cataract: Secondary | ICD-10-CM | POA: Diagnosis present

## 2019-03-10 DIAGNOSIS — E876 Hypokalemia: Secondary | ICD-10-CM | POA: Diagnosis not present

## 2019-03-10 DIAGNOSIS — Z823 Family history of stroke: Secondary | ICD-10-CM | POA: Diagnosis not present

## 2019-03-10 DIAGNOSIS — Z79899 Other long term (current) drug therapy: Secondary | ICD-10-CM

## 2019-03-10 DIAGNOSIS — R001 Bradycardia, unspecified: Secondary | ICD-10-CM | POA: Diagnosis not present

## 2019-03-10 DIAGNOSIS — F329 Major depressive disorder, single episode, unspecified: Secondary | ICD-10-CM | POA: Diagnosis present

## 2019-03-10 DIAGNOSIS — I5033 Acute on chronic diastolic (congestive) heart failure: Secondary | ICD-10-CM | POA: Diagnosis present

## 2019-03-10 DIAGNOSIS — R0600 Dyspnea, unspecified: Secondary | ICD-10-CM | POA: Diagnosis not present

## 2019-03-10 DIAGNOSIS — M1990 Unspecified osteoarthritis, unspecified site: Secondary | ICD-10-CM | POA: Diagnosis present

## 2019-03-10 DIAGNOSIS — D510 Vitamin B12 deficiency anemia due to intrinsic factor deficiency: Secondary | ICD-10-CM | POA: Diagnosis present

## 2019-03-10 DIAGNOSIS — F419 Anxiety disorder, unspecified: Secondary | ICD-10-CM | POA: Diagnosis present

## 2019-03-10 DIAGNOSIS — I5031 Acute diastolic (congestive) heart failure: Secondary | ICD-10-CM | POA: Diagnosis not present

## 2019-03-10 DIAGNOSIS — I442 Atrioventricular block, complete: Secondary | ICD-10-CM | POA: Diagnosis present

## 2019-03-10 DIAGNOSIS — E559 Vitamin D deficiency, unspecified: Secondary | ICD-10-CM | POA: Diagnosis present

## 2019-03-10 DIAGNOSIS — K2970 Gastritis, unspecified, without bleeding: Secondary | ICD-10-CM | POA: Diagnosis present

## 2019-03-10 DIAGNOSIS — K219 Gastro-esophageal reflux disease without esophagitis: Secondary | ICD-10-CM | POA: Diagnosis present

## 2019-03-10 DIAGNOSIS — I499 Cardiac arrhythmia, unspecified: Secondary | ICD-10-CM | POA: Diagnosis not present

## 2019-03-10 DIAGNOSIS — I4510 Unspecified right bundle-branch block: Secondary | ICD-10-CM | POA: Diagnosis present

## 2019-03-10 DIAGNOSIS — I340 Nonrheumatic mitral (valve) insufficiency: Secondary | ICD-10-CM | POA: Diagnosis not present

## 2019-03-10 DIAGNOSIS — Z85828 Personal history of other malignant neoplasm of skin: Secondary | ICD-10-CM | POA: Diagnosis not present

## 2019-03-10 DIAGNOSIS — Z950 Presence of cardiac pacemaker: Secondary | ICD-10-CM | POA: Diagnosis not present

## 2019-03-10 DIAGNOSIS — Z95818 Presence of other cardiac implants and grafts: Secondary | ICD-10-CM

## 2019-03-10 DIAGNOSIS — R0602 Shortness of breath: Secondary | ICD-10-CM | POA: Diagnosis not present

## 2019-03-10 DIAGNOSIS — I959 Hypotension, unspecified: Secondary | ICD-10-CM | POA: Diagnosis not present

## 2019-03-10 DIAGNOSIS — Z959 Presence of cardiac and vascular implant and graft, unspecified: Secondary | ICD-10-CM

## 2019-03-10 DIAGNOSIS — I110 Hypertensive heart disease with heart failure: Secondary | ICD-10-CM | POA: Diagnosis present

## 2019-03-10 DIAGNOSIS — Z7901 Long term (current) use of anticoagulants: Secondary | ICD-10-CM

## 2019-03-10 DIAGNOSIS — I4821 Permanent atrial fibrillation: Secondary | ICD-10-CM | POA: Diagnosis present

## 2019-03-10 DIAGNOSIS — J811 Chronic pulmonary edema: Secondary | ICD-10-CM | POA: Diagnosis not present

## 2019-03-10 DIAGNOSIS — I10 Essential (primary) hypertension: Secondary | ICD-10-CM | POA: Diagnosis not present

## 2019-03-10 HISTORY — PX: TEMPORARY PACEMAKER: CATH118268

## 2019-03-10 LAB — URINALYSIS, ROUTINE W REFLEX MICROSCOPIC
Bilirubin Urine: NEGATIVE
Glucose, UA: NEGATIVE mg/dL
Hgb urine dipstick: NEGATIVE
Ketones, ur: NEGATIVE mg/dL
Leukocytes,Ua: NEGATIVE
Nitrite: NEGATIVE
Protein, ur: NEGATIVE mg/dL
Specific Gravity, Urine: 1.008 (ref 1.005–1.030)
pH: 6 (ref 5.0–8.0)

## 2019-03-10 LAB — CBC WITH DIFFERENTIAL/PLATELET
Abs Immature Granulocytes: 0.02 10*3/uL (ref 0.00–0.07)
Basophils Absolute: 0 10*3/uL (ref 0.0–0.1)
Basophils Relative: 1 %
Eosinophils Absolute: 0.1 10*3/uL (ref 0.0–0.5)
Eosinophils Relative: 1 %
HCT: 36.8 % — ABNORMAL LOW (ref 39.0–52.0)
Hemoglobin: 11.2 g/dL — ABNORMAL LOW (ref 13.0–17.0)
Immature Granulocytes: 0 %
Lymphocytes Relative: 13 %
Lymphs Abs: 1 10*3/uL (ref 0.7–4.0)
MCH: 28.1 pg (ref 26.0–34.0)
MCHC: 30.4 g/dL (ref 30.0–36.0)
MCV: 92.5 fL (ref 80.0–100.0)
Monocytes Absolute: 0.6 10*3/uL (ref 0.1–1.0)
Monocytes Relative: 8 %
Neutro Abs: 6.3 10*3/uL (ref 1.7–7.7)
Neutrophils Relative %: 77 %
Platelets: 79 10*3/uL — ABNORMAL LOW (ref 150–400)
RBC: 3.98 MIL/uL — ABNORMAL LOW (ref 4.22–5.81)
RDW: 15.7 % — ABNORMAL HIGH (ref 11.5–15.5)
WBC: 8.1 10*3/uL (ref 4.0–10.5)
nRBC: 0 % (ref 0.0–0.2)

## 2019-03-10 LAB — BRAIN NATRIURETIC PEPTIDE
B Natriuretic Peptide: 266.5 pg/mL — ABNORMAL HIGH (ref 0.0–100.0)
B Natriuretic Peptide: 267.6 pg/mL — ABNORMAL HIGH (ref 0.0–100.0)

## 2019-03-10 LAB — CBC
HCT: 36.5 % — ABNORMAL LOW (ref 39.0–52.0)
Hemoglobin: 11.3 g/dL — ABNORMAL LOW (ref 13.0–17.0)
MCH: 28.4 pg (ref 26.0–34.0)
MCHC: 31 g/dL (ref 30.0–36.0)
MCV: 91.7 fL (ref 80.0–100.0)
Platelets: 82 10*3/uL — ABNORMAL LOW (ref 150–400)
RBC: 3.98 MIL/uL — ABNORMAL LOW (ref 4.22–5.81)
RDW: 15.6 % — ABNORMAL HIGH (ref 11.5–15.5)
WBC: 7 10*3/uL (ref 4.0–10.5)
nRBC: 0 % (ref 0.0–0.2)

## 2019-03-10 LAB — BASIC METABOLIC PANEL
Anion gap: 11 (ref 5–15)
BUN: 20 mg/dL (ref 8–23)
CO2: 23 mmol/L (ref 22–32)
Calcium: 9.2 mg/dL (ref 8.9–10.3)
Chloride: 106 mmol/L (ref 98–111)
Creatinine, Ser: 1.39 mg/dL — ABNORMAL HIGH (ref 0.61–1.24)
GFR calc Af Amer: 53 mL/min — ABNORMAL LOW (ref >60)
GFR calc non Af Amer: 46 mL/min — ABNORMAL LOW (ref >60)
Glucose, Bld: 150 mg/dL — ABNORMAL HIGH (ref 70–99)
Potassium: 4.2 mmol/L (ref 3.5–5.1)
Sodium: 140 mmol/L (ref 135–145)

## 2019-03-10 LAB — POTASSIUM: Potassium: 4 mmol/L (ref 3.5–5.1)

## 2019-03-10 LAB — CREATININE, SERUM
Creatinine, Ser: 1.35 mg/dL — ABNORMAL HIGH (ref 0.61–1.24)
GFR calc Af Amer: 55 mL/min — ABNORMAL LOW (ref >60)
GFR calc non Af Amer: 48 mL/min — ABNORMAL LOW (ref >60)

## 2019-03-10 LAB — MAGNESIUM: Magnesium: 2.5 mg/dL — ABNORMAL HIGH (ref 1.7–2.4)

## 2019-03-10 LAB — MRSA PCR SCREENING: MRSA by PCR: NEGATIVE

## 2019-03-10 SURGERY — TEMPORARY PACEMAKER
Anesthesia: LOCAL

## 2019-03-10 MED ORDER — RIVAROXABAN 20 MG PO TABS: 20.0000 mg | ORAL_TABLET | Freq: Every day | ORAL | Status: DC

## 2019-03-10 MED ORDER — HEPARIN SODIUM (PORCINE) 5000 UNIT/ML IJ SOLN: 5000.0000 [IU] | Freq: Three times a day (TID) | INTRAMUSCULAR | Status: DC

## 2019-03-10 MED ORDER — HEPARIN (PORCINE) IN NACL 1000-0.9 UT/500ML-% IV SOLN
INTRAVENOUS | Status: AC
  Filled 2019-03-10: qty 500

## 2019-03-10 MED ORDER — HEPARIN (PORCINE) IN NACL 1000-0.9 UT/500ML-% IV SOLN
INTRAVENOUS | Status: DC | PRN
  Administered 2019-03-10: 500 mL

## 2019-03-10 MED ORDER — FUROSEMIDE 10 MG/ML IJ SOLN
80.0000 mg | Freq: Three times a day (TID) | INTRAMUSCULAR | Status: DC
  Administered 2019-03-10 – 2019-03-14 (×12): 80 mg via INTRAVENOUS
  Filled 2019-03-10 (×12): qty 8

## 2019-03-10 MED ORDER — SODIUM CHLORIDE 0.9% FLUSH: 3.0000 mL | INTRAVENOUS | Status: DC | PRN

## 2019-03-10 MED ORDER — SODIUM CHLORIDE 0.9 % IV SOLN
INTRAVENOUS | Status: DC | PRN
  Administered 2019-03-10: 10 mL/h via INTRAVENOUS

## 2019-03-10 MED ORDER — SODIUM CHLORIDE 0.9 % IV SOLN
250.0000 mL | INTRAVENOUS | Status: DC | PRN
  Administered 2019-03-10 – 2019-03-11 (×2): 250 mL via INTRAVENOUS

## 2019-03-10 MED ORDER — ATROPINE SULFATE 1 MG/10ML IJ SOSY
1.0000 mg | PREFILLED_SYRINGE | Freq: Once | INTRAMUSCULAR | Status: DC
  Filled 2019-03-10: qty 10

## 2019-03-10 MED ORDER — IPRATROPIUM-ALBUTEROL 0.5-2.5 (3) MG/3ML IN SOLN
RESPIRATORY_TRACT | Status: AC
  Administered 2019-03-10: 18:00:00
  Filled 2019-03-10: qty 3

## 2019-03-10 MED ORDER — FUROSEMIDE 10 MG/ML IJ SOLN
40.0000 mg | Freq: Four times a day (QID) | INTRAMUSCULAR | Status: DC
  Administered 2019-03-10: 15:00:00 40 mg via INTRAVENOUS
  Filled 2019-03-10: qty 4

## 2019-03-10 MED ORDER — SODIUM CHLORIDE 0.9% FLUSH
3.0000 mL | Freq: Two times a day (BID) | INTRAVENOUS | Status: DC
  Administered 2019-03-10 – 2019-03-14 (×8): 3 mL via INTRAVENOUS

## 2019-03-10 MED ORDER — HEPARIN (PORCINE) 25000 UT/250ML-% IV SOLN
1300.0000 [IU]/h | INTRAVENOUS | Status: DC
  Administered 2019-03-10: 17:00:00 1400 [IU]/h via INTRAVENOUS
  Filled 2019-03-10: qty 250

## 2019-03-10 MED ORDER — IPRATROPIUM-ALBUTEROL 20-100 MCG/ACT IN AERS
1.0000 | INHALATION_SPRAY | Freq: Four times a day (QID) | RESPIRATORY_TRACT | Status: DC | PRN
  Administered 2019-03-11 (×2): 1 via RESPIRATORY_TRACT
  Filled 2019-03-10: qty 4

## 2019-03-10 MED ORDER — LIDOCAINE HCL (PF) 1 % IJ SOLN
INTRAMUSCULAR | Status: AC
  Filled 2019-03-10: qty 30

## 2019-03-10 MED ORDER — ONDANSETRON HCL 4 MG/2ML IJ SOLN: 4.0000 mg | Freq: Four times a day (QID) | INTRAMUSCULAR | Status: DC | PRN

## 2019-03-10 MED ORDER — POTASSIUM CHLORIDE CRYS ER 10 MEQ PO TBCR
10.0000 meq | EXTENDED_RELEASE_TABLET | Freq: Every day | ORAL | Status: DC
  Administered 2019-03-10 – 2019-03-12 (×2): 10 meq via ORAL
  Filled 2019-03-10 (×4): qty 1

## 2019-03-10 MED ORDER — LIDOCAINE HCL (PF) 1 % IJ SOLN
INTRAMUSCULAR | Status: DC | PRN
  Administered 2019-03-10: 18 mL via SUBCUTANEOUS

## 2019-03-10 MED ORDER — ACETAMINOPHEN 325 MG PO TABS
650.0000 mg | ORAL_TABLET | ORAL | Status: DC | PRN
  Administered 2019-03-13 – 2019-03-14 (×2): 650 mg via ORAL
  Filled 2019-03-10 (×2): qty 2

## 2019-03-10 SURGICAL SUPPLY — 6 items
CABLE ADAPT CONN TEMP 6FT (ADAPTER) ×1 IMPLANT
CATH S G BIP PACING (CATHETERS) ×1 IMPLANT
PACK CARDIAC CATHETERIZATION (CUSTOM PROCEDURE TRAY) ×1 IMPLANT
SHEATH PINNACLE 6F 10CM (SHEATH) ×1 IMPLANT
SLEEVE REPOSITIONING LENGTH 30 (MISCELLANEOUS) ×1 IMPLANT
WIRE EMERALD 3MM-J .035X150CM (WIRE) ×1 IMPLANT

## 2019-03-10 NOTE — H&P (Signed)
History & Physical    Patient ID: Austin Gray. MRN: [id number], DOB/AGE: 83-Feb-1933   Admit date: 03/10/2019  Primary Physician: [name], MD Primary Cardiologist: Dr. Minus Breeding, MD   Patient Profile    [name] is an 85 year old male with a history of atrial fibrillation on chronic anticoagulation with Xarelto, hypertension and chronic systolic and diastolic heart failure who is being admitted by cardiology service for bradycardia and CHF exacerbation.  Past Medical History   Past Medical History:  Diagnosis Date  . Anxiety   . Arthritis    "little bit; left knee"  . Atrial fibrillation (HCC)    Chronic  . Basal cell carcinoma   . Cataracts, bilateral    to have left eye cataract surgery on 03/21/17   . CHF (congestive heart failure), NYHA class II, chronic, diastolic ([place])   . Colon polyps    last colonoscopy 2015 (tubular adenoma)  . Depression   . Diverticulosis   . Gastritis   . GERD (gastroesophageal reflux disease)   . H/O: GI bleed    Recurrent  . Hemorrhoids   . History of blood transfusion   . History of BPH   . History of kidney stones    hx of x 1   . Hypertension   . Pernicious anemia   . PVC (premature ventricular contraction)   . Rectal bleeding 02/15/12  . Vitamin D deficiency     Past Surgical History:  Procedure Laterality Date  . COLONOSCOPY WITH PROPOFOL N/A 03/17/2017   Procedure: COLONOSCOPY WITH PROPOFOL;  Surgeon: Carol Ada, MD;  Location: [facility];  Service: Endoscopy;  Laterality: N/A;  . [place]; ~ 2002   left; right    Allergies  No Known Allergies  History of Present Illness    [name] is an 85 year old male with a history stated above who presented to [facility] on 03/10/2019 with bradycardia, dyspnea on exertion, and bilateral lower extremity edema.  Patient reports that his symptoms have become progressively worse over the last month for which he is followed with his PCP for  bradycardia.  Some medications were changed however this did not seem to help with his heart rate.  He reports being off of his metoprolol for the last month per PCPs recommendations.  Daughter feels that all of this began after his increase in clonidine back in March 2020 which is since been stopped.  In the ED, EKG with junctional bradycardia with HR 30 and RBBB with no acute ischemic changes.  Creatinine is elevated at 1.39 today with last lab value of 0.97 on 01/28/2019.  CXR with cardiomegaly with chronic interstitial changes and bibasilar atelectasis or infiltrate.  There was nodular opacity at the right base which was noted to be stable and likely a granuloma but follow-up CT chest without contrast was recommended to confirm.   During initial cardiology exam, Dr. Meda Coffee at bedside when patient has a 17 second pause. There was no LOC and vital are stable. STEMI team activated for transvenous pacer today.   He was last seen by our service on 09/28/2018 for follow-up of atrial fibrillation.  He was admitted 09/05/2017 after he presented to his PCPs office with bradycardia and symptoms of fluid volume overload. Initial lab work-up also suggested acute kidney injury with a creatinine elevation up to 1.5.  His heart rate was in the 30s in the emergency department.  His beta-blocker was held during the admission.  His ACE  inhibitor was discontinued as well.  An echocardiogram obtained on 09/06/2017 showed mild LVH, LVEF of 55 to 60%, moderate MR, moderate TR and a PA peak pressure of 36 mmHg.  His renal function had normalized by 09/15/2017.  In last follow-up, he was noted to be doing well from a cardiac perspective.  He had no palpitations, presyncope or syncopal episodes.  He had good rate control with his atrial fibrillation and his medications were continued.  His weight was noted to be stable in the 242lb to 244lb range.  His BP was noted to be elevated and his clonidine was increased to 0.1 mg twice daily.   The patient states that in the last 3 days he became significantly short of breath and its progressively worsening, he also developed significant lower extremity edema.  He was unable to sleep last night as he could not breathe.  In the last months he went to his primary care physicians 3 times and multiple medications were discontinued including clonidine and metoprolol however his bradycardia persisted.  He denies any falls.  No chest pain.  Home Medications    Prior to Admission medications   Medication Sig Start Date End Date Taking? Authorizing Provider  albuterol (VENTOLIN HFA) 108 (90 Base) MCG/ACT inhaler INHALE 2 PUFFS BY MOUTH EVERY 6 HOURS AS NEEDED FOR WHEEZING OR SHORTNESS OF BREATH 02/04/19   [name], MD  amLODipine (NORVASC) 10 MG tablet TAKE 1 TABLET (10 MG TOTAL) BY MOUTH DAILY. 01/21/19   [name], MD  cloNIDine (CATAPRES) 0.1 MG tablet Take 1 tablet (0.1 mg total) by mouth 2 (two) times daily. Patient taking differently: Take 0.1 mg by mouth daily.  09/28/18   Minus Breeding, MD  doxazosin (CARDURA) 4 MG tablet TAKE 1 TABLET EVERY DAY 01/21/19   [name], MD  ferrous sulfate 325 (65 FE) MG tablet Take 325 mg by mouth every other day.     [provider]  fluticasone (FLONASE) 50 MCG/ACT nasal spray PLACE 1 SPRAY INTO BOTH NOSTRILS DAILY. 11/26/18   [name], MD  furosemide (LASIX) 40 MG tablet TAKE 2 TABLETS (80MG ) EVERY MORNING AND 1 TABLET (40MG ) EVERY EVENING 11/27/18   [name], MD  losartan (COZAAR) 50 MG tablet Take 1 tablet (50 mg total) by mouth daily. 02/04/19   [name], MD  Multiple Vitamin (MULTIVITAMIN) tablet Take 1 tablet by mouth daily.    [provider]  multivitamin-lutein (OCUVITE-LUTEIN) CAPS Take 1 capsule by mouth daily.    [provider]  potassium chloride (K-DUR) 10 MEQ tablet TAKE 1 TABLET EVERY DAY 05/30/18   [name], MD  XARELTO 20 MG TABS tablet TAKE 1 TABLET  EVERY DAY WITH SUPPER 09/14/18   [name], MD    Family History    Family History  Problem Relation Age of Onset  . Stroke Father 11  . Cancer Mother     Social History    Social History   Socioeconomic History  . Marital status: Widowed    Spouse name: Not on file  . Number of children: 1  . Years of education: Not on file  . Highest education level: Not on file  Occupational History  . Occupation: Retired  Scientific laboratory technician  . Financial resource strain: Not on file  . Food insecurity    Worry: Not on file    Inability: Not on file  . Transportation needs    Medical: Not on file  Non-medical: Not on file  Tobacco Use  . Smoking status: Never Smoker  . Smokeless tobacco: Former Systems developer    Types: Snuff  . Tobacco comment: 02/15/12 "still use snuff @ times; not daily"  Substance and Sexual Activity  . Alcohol use: No  . Drug use: No  . Sexual activity: Never    Comment: widower, lives with granddaughter and great grandchildren.  Lifestyle  . Physical activity    Days per week: Not on file    Minutes per session: Not on file  . Stress: Not on file  Relationships  . Social Herbalist on phone: Not on file    Gets together: Not on file    Attends religious service: Not on file    Active member of club or organization: Not on file    Attends meetings of clubs or organizations: Not on file    Relationship status: Not on file  . Intimate partner violence    Fear of current or ex partner: Not on file    Emotionally abused: Not on file    Physically abused: Not on file    Forced sexual activity: Not on file  Other Topics Concern  . Not on file  Social History Narrative   Lives with grand daughter.         Epworth Sleepiness Scale = 7 (as of 09/21/2015)     Review of Systems   See HPI for full review  All other systems reviewed and are otherwise negative except as noted above.  Physical Exam    Blood pressure (!) 116/56, pulse (!) 27, resp.  rate 16, height 5\' 10"  (1.778 m), weight 115.2 kg, SpO2 99 %.   General: Pleasant, well-nourished, A&O and in mild respiratory distress Psych: Normal affect. Neuro: Alert and oriented X 3. Moves all extremities spontaneously. HEENT: Eyes symmetric, no orbital edema. Conjunctive red with mild discharge, PERRLA, vision grossly intact.  Neck: +6 cm JVDs bilaterally, no bruits.Trachea midline, no cervical lymphadenopathy.  Lungs: Crackles bilaterally up to mid chest Heart: RRR, slow and distant.  Abdomen: Soft, non-tender, non-distended, BS + x 4.  Extremities: 4+ edema bilaterally with blisters and vesicles. DP/PT/Radials 2+ and equal bilaterally. MSK: No palpable chest wall or thoracic tenderness  Labs    Troponin (Point of Care Test) No results for input(s): TROPIPOC in the last 72 hours. No results for input(s): CKTOTAL, CKMB, TROPONINI in the last 72 hours. Lab Results  Component Value Date   WBC 8.1 03/10/2019   HGB 11.2 (L) 03/10/2019   HCT 36.8 (L) 03/10/2019   MCV 92.5 03/10/2019   PLT 79 (L) 03/10/2019    Recent Labs  Lab 03/10/19 1244  NA 140  K 4.2  CL 106  CO2 23  BUN 20  CREATININE 1.39*  CALCIUM 9.2  GLUCOSE 150*   Lab Results  Component Value Date   CHOL 176 04/08/2016   HDL 64 04/08/2016   LDLCALC 101 04/08/2016   TRIG 57 04/08/2016   No results found for: [facility]   Radiology Studies    Dg Chest Portable 1 View  Result Date: 03/10/2019 CLINICAL DATA:  Shortness of breath and chest tightness. EXAM: PORTABLE CHEST 1 VIEW COMPARISON:  09/07/2017 FINDINGS: 1314 hours. The cardio pericardial silhouette is enlarged. There is pulmonary vascular congestion without overt pulmonary edema. Interstitial markings are diffusely coarsened with chronic features. Bibasilar atelectasis or infiltrate is similar. Stable right basilar nodule, potentially granuloma. Bones are diffusely demineralized. Telemetry leads overlie  the chest. IMPRESSION: Cardiomegaly with chronic  interstitial changes and bibasilar atelectasis or infiltrate. Nodular opacity at the right base is stable and likely a granuloma but follow-up CT chest without contrast recommended to confirm. Electronically Signed   By: Misty Stanley M.D.   On: 03/10/2019 13:44   ECG & Cardiac Imaging    Echocardiogram 09/06/2017: Study Conclusions  - Left ventricle: The cavity size was normal. Wall thickness was   increased in a pattern of mild LVH. Systolic function was normal.   The estimated ejection fraction was in the range of 55% to 60%.   Wall motion was normal; there were no regional wall motion   abnormalities. - Ventricular septum: The contour showed diastolic flattening. - Aortic valve: Trileaflet; mildly thickened, mildly calcified   leaflets. There was trivial regurgitation. Valve area (Vmax):   2.44 cm^2. - Mitral valve: Calcified annulus. There was moderate   regurgitation. - Left atrium: The atrium was mildly dilated. - Right ventricle: The cavity size was mildly dilated. Wall   thickness was normal. - Tricuspid valve: There was moderate regurgitation. - Pulmonary arteries: Systolic pressure was mildly increased. PA   peak pressure: 36 mm Hg (S).  Impressions:  - Compared to the prior study, there has been no significant   interval change.  Holter monitor 09/19/2017:  Atrial fib Rate overall well controlled No prolonged pauses No sustained tachy rates Occasional ectopy.    ECG: Atrial fibrillation with slow ventricular response 30 bpm with right bundle branch block, previously sinus rhythm with right bundle branch block in March 2020 Telemetry: Sinus rhythm with slow ventricular response 27 to 32 bpm and 2 pauses up to 17 seconds.   Assessment & Plan    1.  Bradycardia: -Symptomatic causing CHF  -The patient develop 17 and 7-second pause while I talked to him without syncopal episode however he is significantly short of breath while talking  -We will activate the STEMI  pager for placement of temporary pacer today and have EP see him in the morning for placement of a permanent pacemaker.   -Patient has not been on any AV nodal blocking agents in at least last 2 weeks. - EKG reveals atrial fibrillation with slow ventricular response and HR 30 and RBBB with no acute ischemic changes. -Has been treated for bradycardia in the past dating back to 08/2017 in which he presented to the ED with persistent bradycardia at which time his beta-blocker was held with resolution.  Appears that he has been stable since that time until more recently after clonidine added to his regimen 09/2018 -BP stable despite bradycardia, 116/56, 138/55, 147/54  2.  Acute on chronic CHF exacerbation: -Significant fluid overload on physical exam, also 9 pounds above his baseline of 111 kg in March, -Chest x-ray showing significant pulmonary edema -Last echocardiogram shows an LVEF of 55-60 with no regional wall motion abnormalities.  There is however moderate MR, moderate TR and mildly elevated pulmonary PA peak pressures at 36 mmHg, will obtain a new echocardiogram now -Was last seen by Dr. Percival Spanish 09/2018 and was euvolemic on exam with a stable weight at 245lb -On home Lasix 80 mg QAMand 40 mg QPM  -Strict I&O, daily weights -We will start IV Lasix 40 mg every 6 hours and monitor renal function closely -Anticipate creatinine will decline with diuresis>>follow  -Daily BMET -Repeat echocardiogram for comparison  3.  History of PAF: -Noted to be in normal sinus rhythm at last OP office visit 09/28/2018 -Presented with sinus  bradycardia, junctional bradycardia -Anticoagulated with Xarelto 20 mg daily>>discontinue , start heparin drip concerning the fact that he will require permanent pacemaker placement  4.  Hypertension: -Stable, 116/56>138/55>147/54 -Hold antihypertensives in the setting of bradycardia for now -Takes home amlodipine 10 mg, losartan 50, clonidine 0.1 mg twice daily, we will  hold  Severity of Illness: The appropriate patient status for this patient is OBSERVATION. Observation status is judged to be reasonable and necessary in order to provide the required intensity of service to ensure the patient's safety. The patient's presenting symptoms, physical exam findings, and initial radiographic and laboratory data in the context of their medical condition is felt to place them at decreased risk for further clinical deterioration. Furthermore, it is anticipated that the patient will be medically stable for discharge from the hospital within 2 midnights of admission. The following factors support the patient status of observation.   " The patient's presenting symptoms include bradycardia . " The physical exam findings include SOB, dyspnea. " The initial radiographic and laboratory data are bradycardia on EKG  Signed, Ena Dawley, MD [facility] Pager: [phone] 03/10/2019, @NOW

## 2019-03-10 NOTE — ED Triage Notes (Signed)
Pt bib ems from home with SOB. Found to be bradycardic at 30-34. 93% RA. Placed on 4L [place]. 116/87. Pt alert and oriented X4.

## 2019-03-10 NOTE — Progress Notes (Signed)
SBP consistently 150s-160s.Notified on call cards. No new orders at this time; instructed to monitor for now.

## 2019-03-10 NOTE — Progress Notes (Signed)
Unable to get capture with TVP. HR 64-69. Notified MD. RN instructed to monitor and call if HR drops back to 30s.

## 2019-03-10 NOTE — Progress Notes (Signed)
ANTICOAGULATION CONSULT NOTE - Initial Consult  Pharmacy Consult for heparin Indication: atrial fibrillation  No Known Allergies  Patient Measurements: Height: 5\' 10"  (177.8 cm) Weight: 254 lb (115.2 kg) IBW/kg (Calculated) : 73 Heparin Dosing Weight: 98.4kg  Vital Signs: Temp: 97.7 F (36.5 C) (08/23 1431) Temp Source: Oral (08/23 1431) BP: 116/56 (08/23 1350) Pulse Rate: 27 (08/23 1350)  Labs: Recent Labs    03/10/19 1244  HGB 11.2*  HCT 36.8*  PLT 79*  CREATININE 1.39*    Estimated Creatinine Clearance: 49.4 mL/min (A) (by C-G formula based on SCr of 1.39 mg/dL (H)).   Medical History: Past Medical History:  Diagnosis Date  . Anxiety   . Arthritis    "little bit; left knee"  . Atrial fibrillation (HCC)    Chronic  . Basal cell carcinoma   . Cataracts, bilateral    to have left eye cataract surgery on 03/21/17   . CHF (congestive heart failure), NYHA class II, chronic, diastolic ([place])   . Colon polyps    last colonoscopy 2015 (tubular adenoma)  . Depression   . Diverticulosis   . Gastritis   . GERD (gastroesophageal reflux disease)   . H/O: GI bleed    Recurrent  . Hemorrhoids   . History of blood transfusion   . History of BPH   . History of kidney stones    hx of x 1   . Hypertension   . Pernicious anemia   . PVC (premature ventricular contraction)   . Rectal bleeding 02/15/12  . Vitamin D deficiency     Medications:  Infusions:  . sodium chloride    . heparin      Assessment: 10 yom presented to the ED with bradycardia. He is on chronic xarelto for history of afib. To transition to IV heparin. Baseline Hgb is slightly low at 11.2 and platelets are low at 79. Platelets have run in the low 100's to 130's in the past. No bleeding noted. Last dose of xarelto was yesterday 8/22 at approximately 4pm per patient report.   Goal of Therapy:  Heparin level 0.3-0.7 units/ml aPTT 66-103 seconds Monitor platelets by anticoagulation protocol: Yes    Plan:  Heparin gtt 1400 units/hr starting today at 4pm Check an 8 hr heparin level and aPTT Daily heparin level, aPTT and CBC Will need to monitor platelets closely and watch for signs/symptoms of bleeding  Edi Gorniak, Rande Lawman 03/10/2019,3:17 PM

## 2019-03-10 NOTE — Progress Notes (Signed)
Dr Alveta Heimlich (cards fellow) called and informed that temporary pacemaker intermittenly capturing. MD wanted Dr Martinique (interventionist) instead. Dr Martinique called and informed that mA output increased to 10 to be able to capture. pts b/p stable, pt alert yet confused when HR drops to 30-40. MD states it is ok to continue increasing mA if needed and to call if pt becomes unstable. Will continue to monitor pt closely

## 2019-03-10 NOTE — ED Provider Notes (Signed)
[facility] EMERGENCY DEPARTMENT Provider Note   CSN: [id number] Arrival date & time: 03/10/19  1232     History   Chief Complaint Chief Complaint  Patient presents with  . Bradycardia    HPI Austin Artino. is a 85 y.o. male with history of CHF, hypertension, atrial fibrillation anticoagulated on Xarelto who presents with bradycardia and dyspnea on exertion.  Patient has had associated peripheral edema bilaterally without pain.  This is been worsening over the past month or so.  Patient has been followed by his PCP for bradycardia and had some medication changes, which did not seem to be helping his heart rate to come up.  He has not been taking metoprolol for the past month.  His daughter reports that all seem to start when he was started on clonidine, which has since been stopped. His daughter reports that she was told that probably the next step would be a pacemaker insertion. Patient reports he just started having a dry cough today, but denies any fever.  He reports he has had worsening shortness of breath and leg swelling over the last week.  He reports decreased urination.  He denies any chest pain, abdominal pain, nausea, vomiting.     HPI  Past Medical History:  Diagnosis Date  . Anxiety   . Arthritis    "little bit; left knee"  . Atrial fibrillation (HCC)    Chronic  . Basal cell carcinoma   . Cataracts, bilateral    to have left eye cataract surgery on 03/21/17   . CHF (congestive heart failure), NYHA class II, chronic, diastolic ([place])   . Colon polyps    last colonoscopy 2015 (tubular adenoma)  . Depression   . Diverticulosis   . Gastritis   . GERD (gastroesophageal reflux disease)   . H/O: GI bleed    Recurrent  . Hemorrhoids   . History of blood transfusion   . History of BPH   . History of kidney stones    hx of x 1   . Hypertension   . Pernicious anemia   . PVC (premature ventricular contraction)   . Rectal bleeding 02/15/12  .  Vitamin D deficiency     Patient Active Problem List   Diagnosis Date Noted  . PAF (paroxysmal atrial fibrillation) ([place]) 09/28/2018  . Dyslipidemia 09/28/2018  . Chronic diastolic heart failure ([place]) 11/23/2017  . AKI (acute kidney injury) ([place]) 11/23/2017  . Morbid obesity ([place]) 11/23/2017  . Leg swelling 11/23/2017  . Urinary tract infection 09/07/2017  . Hyponatremia 09/07/2017  . Dyspnea 09/07/2017  . Bradycardia 09/05/2017  . Non-rheumatic mitral regurgitation 02/22/2017  . Atrial fibrillation, persistent 09/22/2015  . Systolic murmur [id number]  . Lower GI bleeding 02/15/2012  . Hypotension 02/15/2012  . History of BPH   . Diverticulosis   . Colon polyps   . Visual impairment   . Hypertension   . Acute blood loss anemia     Past Surgical History:  Procedure Laterality Date  . COLONOSCOPY WITH PROPOFOL N/A 03/17/2017   Procedure: COLONOSCOPY WITH PROPOFOL;  Surgeon: Carol Ada, MD;  Location: [facility];  Service: Endoscopy;  Laterality: N/A;  . [place]; ~ 2002   left; right        Home Medications    Prior to Admission medications   Medication Sig Start Date End Date Taking? Authorizing Provider  albuterol (VENTOLIN HFA) 108 (90 Base) MCG/ACT inhaler INHALE 2 PUFFS BY MOUTH EVERY  6 HOURS AS NEEDED FOR WHEEZING OR SHORTNESS OF BREATH 02/04/19   [name], MD  amLODipine (NORVASC) 10 MG tablet TAKE 1 TABLET (10 MG TOTAL) BY MOUTH DAILY. 01/21/19   [name], MD  cloNIDine (CATAPRES) 0.1 MG tablet Take 1 tablet (0.1 mg total) by mouth 2 (two) times daily. Patient taking differently: Take 0.1 mg by mouth daily.  09/28/18   Minus Breeding, MD  doxazosin (CARDURA) 4 MG tablet TAKE 1 TABLET EVERY DAY 01/21/19   [name], MD  ferrous sulfate 325 (65 FE) MG tablet Take 325 mg by mouth every other day.     [provider]  fluticasone (FLONASE) 50 MCG/ACT nasal spray PLACE 1 SPRAY INTO BOTH NOSTRILS DAILY. 11/26/18    [name], MD  furosemide (LASIX) 40 MG tablet TAKE 2 TABLETS (80MG ) EVERY MORNING AND 1 TABLET (40MG ) EVERY EVENING 11/27/18   [name], MD  losartan (COZAAR) 50 MG tablet Take 1 tablet (50 mg total) by mouth daily. 02/04/19   [name], MD  Multiple Vitamin (MULTIVITAMIN) tablet Take 1 tablet by mouth daily.    [provider]  multivitamin-lutein (OCUVITE-LUTEIN) CAPS Take 1 capsule by mouth daily.    [provider]  potassium chloride (K-DUR) 10 MEQ tablet TAKE 1 TABLET EVERY DAY 05/30/18   [name], MD  XARELTO 20 MG TABS tablet TAKE 1 TABLET EVERY DAY WITH SUPPER 09/14/18   [name], MD    Family History Family History  Problem Relation Age of Onset  . Stroke Father 18  . Cancer Mother     Social History Social History   Tobacco Use  . Smoking status: Never Smoker  . Smokeless tobacco: Former Systems developer    Types: Snuff  . Tobacco comment: 02/15/12 "still use snuff @ times; not daily"  Substance Use Topics  . Alcohol use: No  . Drug use: No     Allergies   Patient has no known allergies.   Review of Systems Review of Systems  Constitutional: Negative for chills and fever.  HENT: Negative for facial swelling and sore throat.   Respiratory: Positive for cough and shortness of breath.   Cardiovascular: Positive for leg swelling. Negative for chest pain.  Gastrointestinal: Negative for abdominal pain, nausea and vomiting.  Genitourinary: Negative for dysuria.  Musculoskeletal: Negative for back pain.  Skin: Negative for rash and wound.  Neurological: Negative for headaches.  Psychiatric/Behavioral: The patient is not nervous/anxious.      Physical Exam Updated Vital Signs BP (!) 116/56   Pulse (!) 27   Temp 97.7 F (36.5 C) (Oral)   Resp 16   Ht 5\' 10"  (1.778 m)   Wt 115.2 kg   SpO2 99%   BMI 36.45 kg/m   Physical Exam Vitals signs and nursing note reviewed.  Constitutional:      General: He is not  in acute distress.    Appearance: He is well-developed. He is not diaphoretic.  HENT:     Head: Normocephalic and atraumatic.     Mouth/Throat:     Pharynx: No oropharyngeal exudate.  Eyes:     General: No scleral icterus.       Right eye: No discharge.        Left eye: No discharge.     Conjunctiva/sclera: Conjunctivae normal.     Pupils: Pupils are equal, round, and reactive to light.  Neck:     Musculoskeletal: Normal range of motion  and neck supple.     Thyroid: No thyromegaly.  Cardiovascular:     Rate and Rhythm: Normal rate and regular rhythm.     Heart sounds: Normal heart sounds. No murmur. No friction rub. No gallop.   Pulmonary:     Effort: Pulmonary effort is normal. No respiratory distress.     Breath sounds: Normal breath sounds. No stridor. No wheezing or rales.  Abdominal:     General: Bowel sounds are normal. There is distension.     Palpations: Abdomen is soft.     Tenderness: There is no abdominal tenderness. There is no guarding or rebound.     Hernia: A hernia is present.     Comments: Chronic abdominal distention from hernia  Musculoskeletal:     Right lower leg: Edema present.     Left lower leg: Edema present.     Comments: 4+ pitting edema bilateral  Lymphadenopathy:     Cervical: No cervical adenopathy.  Skin:    General: Skin is warm and dry.     Coloration: Skin is not pale.     Findings: No rash.  Neurological:     Mental Status: He is alert.     Coordination: Coordination normal.      ED Treatments / Results  Labs (all labs ordered are listed, but only abnormal results are displayed) Labs Reviewed  CBC WITH DIFFERENTIAL/PLATELET - Abnormal; Notable for the following components:      Result Value   RBC 3.98 (*)    Hemoglobin 11.2 (*)    HCT 36.8 (*)    RDW 15.7 (*)    Platelets 79 (*)    All other components within normal limits  BASIC METABOLIC PANEL - Abnormal; Notable for the following components:   Glucose, Bld 150 (*)     Creatinine, Ser 1.39 (*)    GFR calc non Af Amer 46 (*)    GFR calc Af Amer 53 (*)    All other components within normal limits  BRAIN NATRIURETIC PEPTIDE - Abnormal; Notable for the following components:   B Natriuretic Peptide 266.5 (*)    All other components within normal limits  NOVEL CORONAVIRUS, NAA (HOSPITAL ORDER, SEND-OUT TO REF LAB)  URINALYSIS, ROUTINE W REFLEX MICROSCOPIC  CBC  CREATININE, SERUM  BRAIN NATRIURETIC PEPTIDE    EKG None  Radiology Dg Chest Portable 1 View  Result Date: 03/10/2019 CLINICAL DATA:  Shortness of breath and chest tightness. EXAM: PORTABLE CHEST 1 VIEW COMPARISON:  09/07/2017 FINDINGS: 1314 hours. The cardio pericardial silhouette is enlarged. There is pulmonary vascular congestion without overt pulmonary edema. Interstitial markings are diffusely coarsened with chronic features. Bibasilar atelectasis or infiltrate is similar. Stable right basilar nodule, potentially granuloma. Bones are diffusely demineralized. Telemetry leads overlie the chest. IMPRESSION: Cardiomegaly with chronic interstitial changes and bibasilar atelectasis or infiltrate. Nodular opacity at the right base is stable and likely a granuloma but follow-up CT chest without contrast recommended to confirm. Electronically Signed   By: Misty Stanley M.D.   On: 03/10/2019 13:44    Procedures .Critical Care Performed by: Frederica Kuster, PA-C Authorized by: Frederica Kuster, PA-C   Critical care provider statement:    Critical care time (minutes):  45   Critical care was necessary to treat or prevent imminent or life-threatening deterioration of the following conditions:  Cardiac failure   Critical care was time spent personally by me on the following activities:  Discussions with consultants, evaluation of patient's response to treatment,  examination of patient, ordering and performing treatments and interventions, ordering and review of laboratory studies, ordering and review of  radiographic studies, pulse oximetry, re-evaluation of patient's condition, obtaining history from patient or surrogate and review of old charts   I assumed direction of critical care for this patient from another provider in my specialty: no     (including critical care time)  Medications Ordered in ED Medications  atropine 1 MG/10ML injection 1 mg (has no administration in time range)  furosemide (LASIX) injection 40 mg (40 mg Intravenous Given 03/10/19 1445)  rivaroxaban (XARELTO) tablet 20 mg (has no administration in time range)  potassium chloride (K-DUR) CR tablet 10 mEq (has no administration in time range)  sodium chloride flush (NS) 0.9 % injection 3 mL (has no administration in time range)  sodium chloride flush (NS) 0.9 % injection 3 mL (has no administration in time range)  0.9 %  sodium chloride infusion (has no administration in time range)  acetaminophen (TYLENOL) tablet 650 mg (has no administration in time range)  ondansetron (ZOFRAN) injection 4 mg (has no administration in time range)     Initial Impression / Assessment and Plan / ED Course  I have reviewed the triage vital signs and the nursing notes.  Pertinent labs & imaging results that were available during my care of the patient were reviewed by me and considered in my medical decision making (see chart for details).        Patient with ongoing bradycardia and worsening dyspnea on exertion and peripheral edema.  Patient has been followed by cardiology and his PCP with some medication changes, however they do not seem to be improving his bradycardia.  His blood pressure has remained stable.  EKG shows a junctional bradycardia with wide QRS and right bundle.  I discussed patient case with Dr. Meda Coffee with cardiology who will evaluate the patient and admit to their service for transvenous pacer placement.  I appreciate her assistance with the patient.  Patient also evaluated with my attending, Dr. Jeanell Sparrow, who guided the  patient's management and agrees with plan.  Final Clinical Impressions(s) / ED Diagnoses   Final diagnoses:  Bradycardia  Shortness of breath    ED Discharge Orders    None       Frederica Kuster, PA-C 03/10/19 1502    Pattricia Boss, MD 03/11/19 1339

## 2019-03-10 NOTE — ED Notes (Signed)
Pt. Placed on zoll

## 2019-03-11 ENCOUNTER — Ambulatory Visit (HOSPITAL_COMMUNITY): Payer: Medicare HMO

## 2019-03-11 ENCOUNTER — Encounter (HOSPITAL_COMMUNITY): Payer: Self-pay | Admitting: Cardiology

## 2019-03-11 ENCOUNTER — Other Ambulatory Visit (HOSPITAL_COMMUNITY): Payer: Medicare HMO

## 2019-03-11 ENCOUNTER — Encounter (HOSPITAL_COMMUNITY)
Admission: EM | Disposition: A | Discharge status: Home Health | Payer: Self-pay | Source: Home / Self Care | Attending: Cardiology

## 2019-03-11 HISTORY — PX: PACEMAKER IMPLANT: EP1218

## 2019-03-11 LAB — BASIC METABOLIC PANEL
Anion gap: 11 (ref 5–15)
BUN: 18 mg/dL (ref 8–23)
CO2: 25 mmol/L (ref 22–32)
Calcium: 8.7 mg/dL — ABNORMAL LOW (ref 8.9–10.3)
Chloride: 105 mmol/L (ref 98–111)
Creatinine, Ser: 1.22 mg/dL (ref 0.61–1.24)
GFR calc Af Amer: >60 mL/min (ref >60)
GFR calc non Af Amer: 54 mL/min — ABNORMAL LOW (ref >60)
Glucose, Bld: 112 mg/dL — ABNORMAL HIGH (ref 70–99)
Potassium: 3.9 mmol/L (ref 3.5–5.1)
Sodium: 141 mmol/L (ref 135–145)

## 2019-03-11 LAB — SURGICAL PCR SCREEN
MRSA, PCR: NEGATIVE
Staphylococcus aureus: NEGATIVE

## 2019-03-11 LAB — CBC
HCT: 35.8 % — ABNORMAL LOW (ref 39.0–52.0)
Hemoglobin: 11.2 g/dL — ABNORMAL LOW (ref 13.0–17.0)
MCH: 28.1 pg (ref 26.0–34.0)
MCHC: 31.3 g/dL (ref 30.0–36.0)
MCV: 89.7 fL (ref 80.0–100.0)
Platelets: 82 10*3/uL — ABNORMAL LOW (ref 150–400)
RBC: 3.99 MIL/uL — ABNORMAL LOW (ref 4.22–5.81)
RDW: 15.5 % (ref 11.5–15.5)
WBC: 9.2 10*3/uL (ref 4.0–10.5)
nRBC: 0 % (ref 0.0–0.2)

## 2019-03-11 LAB — NOVEL CORONAVIRUS, NAA (HOSP ORDER, SEND-OUT TO REF LAB; TAT 18-24 HRS): SARS-CoV-2, NAA: NOT DETECTED

## 2019-03-11 LAB — HEPARIN LEVEL (UNFRACTIONATED): Heparin Unfractionated: 1.2 IU/mL — ABNORMAL HIGH (ref 0.30–0.70)

## 2019-03-11 LAB — SARS CORONAVIRUS 2 BY RT PCR (HOSPITAL ORDER, PERFORMED IN ~~LOC~~ HOSPITAL LAB): SARS Coronavirus 2: NEGATIVE

## 2019-03-11 LAB — APTT: aPTT: 101 seconds — ABNORMAL HIGH (ref 24–36)

## 2019-03-11 SURGERY — PACEMAKER IMPLANT

## 2019-03-11 MED ORDER — SODIUM CHLORIDE 0.9 % IV SOLN
80.0000 mg | INTRAVENOUS | Status: DC
  Administered 2019-03-11: 80 mg
  Filled 2019-03-11 (×2): qty 2

## 2019-03-11 MED ORDER — HEPARIN (PORCINE) IN NACL 1000-0.9 UT/500ML-% IV SOLN
INTRAVENOUS | Status: DC | PRN
  Administered 2019-03-11: 500 mL

## 2019-03-11 MED ORDER — LORAZEPAM 2 MG/ML IJ SOLN
INTRAMUSCULAR | Status: AC
  Filled 2019-03-11: qty 1

## 2019-03-11 MED ORDER — LORAZEPAM 2 MG/ML IJ SOLN
0.5000 mg | Freq: Once | INTRAMUSCULAR | Status: AC
  Administered 2019-03-11: 21:00:00 0.5 mg via INTRAVENOUS

## 2019-03-11 MED ORDER — LIDOCAINE HCL (PF) 1 % IJ SOLN
INTRAMUSCULAR | Status: DC | PRN
  Administered 2019-03-11: 45 mL

## 2019-03-11 MED ORDER — SODIUM CHLORIDE 0.9 % IV SOLN: INTRAVENOUS | Status: DC

## 2019-03-11 MED ORDER — HEPARIN (PORCINE) IN NACL 1000-0.9 UT/500ML-% IV SOLN
INTRAVENOUS | Status: AC
  Filled 2019-03-11: qty 500

## 2019-03-11 MED ORDER — DEXTROSE 5 % IV SOLN
3.0000 g | INTRAVENOUS | Status: AC
  Administered 2019-03-11: 15:00:00 3 g via INTRAVENOUS
  Filled 2019-03-11: qty 3000

## 2019-03-11 MED ORDER — SODIUM CHLORIDE 0.9 % IV SOLN
INTRAVENOUS | Status: AC
  Filled 2019-03-11: qty 2

## 2019-03-11 MED ORDER — CHLORHEXIDINE GLUCONATE 4 % EX LIQD
60.0000 mL | Freq: Once | CUTANEOUS | Status: AC
  Administered 2019-03-11: 4 via TOPICAL
  Filled 2019-03-11: qty 15

## 2019-03-11 MED ORDER — CHLORHEXIDINE GLUCONATE CLOTH 2 % EX PADS: 6.0000 | MEDICATED_PAD | Freq: Every day | CUTANEOUS | Status: DC

## 2019-03-11 MED ORDER — LIDOCAINE HCL (PF) 1 % IJ SOLN
INTRAMUSCULAR | Status: AC
  Filled 2019-03-11: qty 60

## 2019-03-11 MED ORDER — SODIUM CHLORIDE 0.9 % IV SOLN: INTRAVENOUS | Status: DC | PRN

## 2019-03-11 MED ORDER — CEFAZOLIN SODIUM-DEXTROSE 1-4 GM/50ML-% IV SOLN
1.0000 g | Freq: Four times a day (QID) | INTRAVENOUS | Status: AC
  Administered 2019-03-11 – 2019-03-12 (×3): 1 g via INTRAVENOUS
  Filled 2019-03-11 (×3): qty 50

## 2019-03-11 MED ORDER — CHLORHEXIDINE GLUCONATE 4 % EX LIQD
60.0000 mL | Freq: Once | CUTANEOUS | Status: AC
  Administered 2019-03-11: 4 via TOPICAL

## 2019-03-11 SURGICAL SUPPLY — 7 items
CABLE SURGICAL S-101-97-12 (CABLE) ×2 IMPLANT
HOVERMATT SINGLE USE (MISCELLANEOUS) ×1 IMPLANT
LEAD TENDRIL MRI 58CM LPA1200M (Lead) ×1 IMPLANT
PACEMAKER ASSURITY SR-SF (Pacemaker) ×1 IMPLANT
PAD PRO RADIOLUCENT 2001M-C (PAD) ×2 IMPLANT
SHEATH 8FR PRELUDE SNAP 13 (SHEATH) ×1 IMPLANT
TRAY PACEMAKER INSERTION (PACKS) ×2 IMPLANT

## 2019-03-11 NOTE — Progress Notes (Signed)
Patient restless, pulled IV out, A/O x 4 but will not leave left arm still, has removed sling and oxygen multiple times. Cards MD on call paged and notified, no new orders have been received.   Will continue to monitor.   Keyaira Clapham E Reola Mosher, [place]

## 2019-03-11 NOTE — Progress Notes (Signed)
Patient asleep at this time and does not show any signs of shortness of breath or respiratory distress.Sp02 is 93-95 on 4lpm with respiratory rate 22-25. Breath sounds clear at this time. Will continue to monitor and will provide bipap if patient starts showing signs of shortness of breath.

## 2019-03-11 NOTE — Consult Note (Addendum)
ELECTROPHYSIOLOGY CONSULT NOTE    Patient ID: Austin Gray. MRN: [id number], DOB/AGE: Apr 20, 1932 85 y.o.  Admit date: 03/10/2019 Date of Consult: 03/11/2019  Primary Physician: [name], MD Primary Cardiologist: [place] Electrophysiologist: Curt Bears (new this admission)  Patient Profile: Austin Gray. is a 85 y.o. male with a history of atrial fibrillation, MR, HTN who is being seen today for the evaluation of symptomatic bradycardia at the request of Dr Meda Coffee.  HPI:  Austin Gray. is a 85 y.o. male with the above past medical history. He presented to the hospital with worsening shortness of breath on exertion and heart rates in the 30's.  He also had a 17 second pause while in the ER with some confusion.  He underwent temp wire placement last night and EP has been asked to see this morning.  He is currently V pacing but temp wire output had to be increased overnight last night.  He is intrinsic in the 30's below his pacing rate.  He has had good urine output with IV Lasix but remains volume overloaded this morning. I attempted to call his daughter this morning but was unable to leave a voicemail on her cell phone.   Echo 08/2017 demonstrated EF 55-60%, mild LVH, moderate MR.    He has had progressive shortness of breath with exertion for the last 3 days with LE edema.   Past Medical History:  Diagnosis Date  . Anxiety   . Arthritis    "little bit; left knee"  . Atrial fibrillation (HCC)    Chronic  . Basal cell carcinoma   . Cataracts, bilateral    to have left eye cataract surgery on 03/21/17   . CHF (congestive heart failure), NYHA class II, chronic, diastolic ([place])   . Colon polyps    last colonoscopy 2015 (tubular adenoma)  . Depression   . Diverticulosis   . Gastritis   . GERD (gastroesophageal reflux disease)   . H/O: GI bleed    Recurrent  . Hemorrhoids   . History of blood transfusion   . History of BPH   . History of kidney stones     hx of x 1   . Hypertension   . Pernicious anemia   . PVC (premature ventricular contraction)   . Rectal bleeding 02/15/12  . Vitamin D deficiency      Surgical History:  Past Surgical History:  Procedure Laterality Date  . COLONOSCOPY WITH PROPOFOL N/A 03/17/2017   Procedure: COLONOSCOPY WITH PROPOFOL;  Surgeon: Carol Ada, MD;  Location: [facility];  Service: Endoscopy;  Laterality: N/A;  . [place]; ~ 2002   left; right  . TEMPORARY PACEMAKER N/A 03/10/2019   Procedure: TEMPORARY PACEMAKER;  Surgeon: Martinique, Peter M, MD;  Location: [facility];  Service: Cardiovascular;  Laterality: N/A;     Facility-Administered Medications Prior to Admission  Medication Dose Route Frequency Provider Last Rate Last Dose  . cyanocobalamin ((VITAMIN B-12)) injection 1,000 mcg  1,000 mcg Intramuscular Q30 days [name], MD   1,000 mcg at 01/28/19 1142   Medications Prior to Admission  Medication Sig Dispense Refill Last Dose  . albuterol (VENTOLIN HFA) 108 (90 Base) MCG/ACT inhaler INHALE 2 PUFFS BY MOUTH EVERY 6 HOURS AS NEEDED FOR WHEEZING OR SHORTNESS OF BREATH 18 g 3   . amLODipine (NORVASC) 10 MG tablet TAKE 1 TABLET (10 MG TOTAL) BY MOUTH DAILY. 90 tablet 1   . cloNIDine (  CATAPRES) 0.1 MG tablet Take 1 tablet (0.1 mg total) by mouth 2 (two) times daily. (Patient taking differently: Take 0.1 mg by mouth daily. ) 180 tablet 3   . doxazosin (CARDURA) 4 MG tablet TAKE 1 TABLET EVERY DAY 90 tablet 1   . ferrous sulfate 325 (65 FE) MG tablet Take 325 mg by mouth every other day.      . fluticasone (FLONASE) 50 MCG/ACT nasal spray PLACE 1 SPRAY INTO BOTH NOSTRILS DAILY. 16 g 5   . furosemide (LASIX) 40 MG tablet TAKE 2 TABLETS (80MG ) EVERY MORNING AND 1 TABLET (40MG ) EVERY EVENING 270 tablet 3   . losartan (COZAAR) 50 MG tablet Take 1 tablet (50 mg total) by mouth daily. 90 tablet 3   . Multiple Vitamin (MULTIVITAMIN) tablet Take 1 tablet by mouth daily.     .  multivitamin-lutein (OCUVITE-LUTEIN) CAPS Take 1 capsule by mouth daily.     . potassium chloride (K-DUR) 10 MEQ tablet TAKE 1 TABLET EVERY DAY 90 tablet 3   . XARELTO 20 MG TABS tablet TAKE 1 TABLET EVERY DAY WITH SUPPER 90 tablet 3     Inpatient Medications:  . atropine  1 mg Intravenous Once  . chlorhexidine  60 mL Topical Once  . chlorhexidine  60 mL Topical Once  . furosemide  80 mg Intravenous Q8H  . gentamicin irrigation  80 mg Irrigation On Call  . potassium chloride  10 mEq Oral Daily  . sodium chloride flush  3 mL Intravenous Q12H    Allergies: No Known Allergies  Social History   Socioeconomic History  . Marital status: Widowed    Spouse name: Not on file  . Number of children: 1  . Years of education: Not on file  . Highest education level: Not on file  Occupational History  . Occupation: Retired  Scientific laboratory technician  . Financial resource strain: Not on file  . Food insecurity    Worry: Not on file    Inability: Not on file  . Transportation needs    Medical: Not on file    Non-medical: Not on file  Tobacco Use  . Smoking status: Never Smoker  . Smokeless tobacco: Former Systems developer    Types: Snuff  . Tobacco comment: 02/15/12 "still use snuff @ times; not daily"  Substance and Sexual Activity  . Alcohol use: No  . Drug use: No  . Sexual activity: Never    Comment: widower, lives with granddaughter and great grandchildren.  Lifestyle  . Physical activity    Days per week: Not on file    Minutes per session: Not on file  . Stress: Not on file  Relationships  . Social Herbalist on phone: Not on file    Gets together: Not on file    Attends religious service: Not on file    Active member of club or organization: Not on file    Attends meetings of clubs or organizations: Not on file    Relationship status: Not on file  . Intimate partner violence    Fear of current or ex partner: Not on file    Emotionally abused: Not on file    Physically abused: Not  on file    Forced sexual activity: Not on file  Other Topics Concern  . Not on file  Social History Narrative   Lives with grand daughter.         Epworth Sleepiness Scale = 7 (as of 09/21/2015)  Family History  Problem Relation Age of Onset  . Stroke Father 23  . Cancer Mother      Review of Systems: All other systems reviewed and are otherwise negative except as noted above.  Physical Exam: Vitals:   03/11/19 0630 03/11/19 0700 03/11/19 0730 03/11/19 0800  BP: (!) 136/58 (!) 147/66 136/69 (!) 147/73  Pulse: 80 87 85 80  Resp: 20 (!) 27 (!) 24 (!) 21  Temp:   98.3 F (36.8 C)   TempSrc:      SpO2: 94% 96% 97% 98%  Weight:      Height:        GEN- The patient is elderly appearing, alert and oriented x 3 today.   HEENT: normocephalic, atraumatic; sclera clear, conjunctiva pink; hearing intact; oropharynx clear; +JVD Lungs- Slightly increased work of breathing, +crackles Heart- Regular rate and rhythm (paced) GI- soft, non-tender, non-distended, bowel sounds present Extremities- no clubbing, cyanosis, 3+ LE edema MS- no significant deformity or atrophy Skin- warm and dry, no rash or lesion Psych- euthymic mood, full affect Neuro- strength and sensation are intact  Labs:   Lab Results  Component Value Date   WBC 9.2 03/11/2019   HGB 11.2 (L) 03/11/2019   HCT 35.8 (L) 03/11/2019   MCV 89.7 03/11/2019   PLT 82 (L) 03/11/2019    Recent Labs  Lab 03/11/19 0248  NA 141  K 3.9  CL 105  CO2 25  BUN 18  CREATININE 1.22  CALCIUM 8.7*  GLUCOSE 112*      Radiology/Studies: Dg Chest Portable 1 View  Result Date: 03/10/2019 CLINICAL DATA:  Shortness of breath and chest tightness. EXAM: PORTABLE CHEST 1 VIEW COMPARISON:  09/07/2017 FINDINGS: 1314 hours. The cardio pericardial silhouette is enlarged. There is pulmonary vascular congestion without overt pulmonary edema. Interstitial markings are diffusely coarsened with chronic features. Bibasilar atelectasis or  infiltrate is similar. Stable right basilar nodule, potentially granuloma. Bones are diffusely demineralized. Telemetry leads overlie the chest. IMPRESSION: Cardiomegaly with chronic interstitial changes and bibasilar atelectasis or infiltrate. Nodular opacity at the right base is stable and likely a granuloma but follow-up CT chest without contrast recommended to confirm. Electronically Signed   By: Misty Stanley M.D.   On: 03/10/2019 13:44    [id number] fibrillation, complete heart block, RBBB, V rate 30 (personally reviewed)  TELEMETRY: V paced (personally reviewed)   Assessment/Plan: 1.  Complete heart block The patient presents with complete heart block on no AVN blocking agents (has been off meds for 2 weeks)  Will plan for pacemaker today.  Plan to discuss with patient and daughter when she arrives. I attempted to call her cell phone this morning but was uanble to leave a message  2.  Acute diastolic heart failure Likely 2/2 bradycardia Continue IV Lasix every 8 hours Echo ordered  3.  Permanent atrial fibrillation CHADS2VASC is 3 Continue Xarelto long term, hold [place] for pacemaker implant today  4.  HTN Stable No change required today  5.  MR Updated echo pending   Dr Curt Bears to see later today   For questions or updates, please contact [facility] Please consult [email or web address] for contact info under Cardiology/STEMI.  Signed, Chanetta Marshall, NP 03/11/2019 8:24 AM  I have seen and examined this patient with Chanetta Marshall.  Agree with above, note added to reflect my findings.  On exam, RRR, no murmurs, 2-3+ LE edema.  Patient presented to the hopsital with complete heart block now with temporary wire in place. Will  need pacemaker implant today. Is volume overloaded. Will plan for continued diuresis.    Austin Gray. has presented today for surgery, with the diagnosis of complete heart block.  The various methods of treatment have been discussed with the patient  and family. After consideration of risks, benefits and other options for treatment, the patient has consented to  Procedure(s): Pacemaker insertion as a surgical intervention .  Risks include but not limited to bleeding, tamponade, infection, pneumothorax, among others. The patient's history has been reviewed, patient examined, no change in status, stable for surgery.  I have reviewed the patient's chart and labs.  Questions were answered to the patient's satisfaction.    Will Curt Bears, MD 03/11/2019 1:15 PM    Will M. Camnitz MD 03/11/2019 1:13 PM

## 2019-03-11 NOTE — Progress Notes (Signed)
Orthopedic Tech Progress Note Patient Details:  Austin Gray 1932/06/28 [id number]  Ortho Devices Type of Ortho Device: Knee Immobilizer Ortho Device/Splint Location: delivered to rn Ortho Device/Splint Interventions: Renard Matter 03/11/2019, 12:42 AM

## 2019-03-11 NOTE — Progress Notes (Signed)
ANTICOAGULATION CONSULT NOTE - Follow Up Consult  Pharmacy Consult for heparin Indication: atrial fibrillation  Labs: Recent Labs    03/10/19 1244 03/10/19 1707 03/10/19 2305  HGB 11.2* 11.3*  --   HCT 36.8* 36.5*  --   PLT 79* 82*  --   APTT  --   --  101*  HEPARINUNFRC  --   --  1.20*  CREATININE 1.39* 1.35*  --     Assessment: 85yo male therapeutic on heparin with initial dosing while Xarelto on hold though at upper end of goal after just six hours of infusion; RN notes some leaking at IV site.  Goal of Therapy:  aPTT 66-102 seconds   Plan:  Will decrease heparin gtt by 1 unit/kg/hr to 1300 units/hr and check PTT in 8 hours.    Wynona Neat, PharmD, BCPS  03/11/2019,12:32 AM

## 2019-03-12 ENCOUNTER — Observation Stay (HOSPITAL_COMMUNITY): Payer: Medicare HMO

## 2019-03-12 ENCOUNTER — Inpatient Hospital Stay (HOSPITAL_COMMUNITY): Payer: Medicare HMO

## 2019-03-12 ENCOUNTER — Encounter (HOSPITAL_COMMUNITY): Payer: Self-pay | Admitting: Cardiology

## 2019-03-12 DIAGNOSIS — Z8719 Personal history of other diseases of the digestive system: Secondary | ICD-10-CM | POA: Diagnosis not present

## 2019-03-12 DIAGNOSIS — I10 Essential (primary) hypertension: Secondary | ICD-10-CM | POA: Diagnosis not present

## 2019-03-12 DIAGNOSIS — Z823 Family history of stroke: Secondary | ICD-10-CM | POA: Diagnosis not present

## 2019-03-12 DIAGNOSIS — H269 Unspecified cataract: Secondary | ICD-10-CM | POA: Diagnosis present

## 2019-03-12 DIAGNOSIS — M1990 Unspecified osteoarthritis, unspecified site: Secondary | ICD-10-CM | POA: Diagnosis present

## 2019-03-12 DIAGNOSIS — I340 Nonrheumatic mitral (valve) insufficiency: Secondary | ICD-10-CM | POA: Diagnosis not present

## 2019-03-12 DIAGNOSIS — I5033 Acute on chronic diastolic (congestive) heart failure: Secondary | ICD-10-CM | POA: Diagnosis present

## 2019-03-12 DIAGNOSIS — Z950 Presence of cardiac pacemaker: Secondary | ICD-10-CM | POA: Diagnosis not present

## 2019-03-12 DIAGNOSIS — Z85828 Personal history of other malignant neoplasm of skin: Secondary | ICD-10-CM | POA: Diagnosis not present

## 2019-03-12 DIAGNOSIS — Z7901 Long term (current) use of anticoagulants: Secondary | ICD-10-CM | POA: Diagnosis not present

## 2019-03-12 DIAGNOSIS — E559 Vitamin D deficiency, unspecified: Secondary | ICD-10-CM | POA: Diagnosis present

## 2019-03-12 DIAGNOSIS — Z79899 Other long term (current) drug therapy: Secondary | ICD-10-CM | POA: Diagnosis not present

## 2019-03-12 DIAGNOSIS — R001 Bradycardia, unspecified: Secondary | ICD-10-CM | POA: Diagnosis present

## 2019-03-12 DIAGNOSIS — K2970 Gastritis, unspecified, without bleeding: Secondary | ICD-10-CM | POA: Diagnosis present

## 2019-03-12 DIAGNOSIS — I4510 Unspecified right bundle-branch block: Secondary | ICD-10-CM | POA: Diagnosis present

## 2019-03-12 DIAGNOSIS — D510 Vitamin B12 deficiency anemia due to intrinsic factor deficiency: Secondary | ICD-10-CM | POA: Diagnosis present

## 2019-03-12 DIAGNOSIS — Z20828 Contact with and (suspected) exposure to other viral communicable diseases: Secondary | ICD-10-CM | POA: Diagnosis present

## 2019-03-12 DIAGNOSIS — I4821 Permanent atrial fibrillation: Secondary | ICD-10-CM | POA: Diagnosis present

## 2019-03-12 DIAGNOSIS — E876 Hypokalemia: Secondary | ICD-10-CM | POA: Diagnosis not present

## 2019-03-12 DIAGNOSIS — F419 Anxiety disorder, unspecified: Secondary | ICD-10-CM | POA: Diagnosis present

## 2019-03-12 DIAGNOSIS — F329 Major depressive disorder, single episode, unspecified: Secondary | ICD-10-CM | POA: Diagnosis present

## 2019-03-12 DIAGNOSIS — K219 Gastro-esophageal reflux disease without esophagitis: Secondary | ICD-10-CM | POA: Diagnosis present

## 2019-03-12 DIAGNOSIS — I110 Hypertensive heart disease with heart failure: Secondary | ICD-10-CM | POA: Diagnosis present

## 2019-03-12 DIAGNOSIS — I442 Atrioventricular block, complete: Secondary | ICD-10-CM | POA: Diagnosis present

## 2019-03-12 LAB — CBC
HCT: 37.3 % — ABNORMAL LOW (ref 39.0–52.0)
Hemoglobin: 11.8 g/dL — ABNORMAL LOW (ref 13.0–17.0)
MCH: 28.4 pg (ref 26.0–34.0)
MCHC: 31.6 g/dL (ref 30.0–36.0)
MCV: 89.9 fL (ref 80.0–100.0)
Platelets: 83 10*3/uL — ABNORMAL LOW (ref 150–400)
RBC: 4.15 MIL/uL — ABNORMAL LOW (ref 4.22–5.81)
RDW: 15.3 % (ref 11.5–15.5)
WBC: 9.2 10*3/uL (ref 4.0–10.5)
nRBC: 0 % (ref 0.0–0.2)

## 2019-03-12 LAB — BASIC METABOLIC PANEL
Anion gap: 14 (ref 5–15)
BUN: 14 mg/dL (ref 8–23)
CO2: 28 mmol/L (ref 22–32)
Calcium: 8.4 mg/dL — ABNORMAL LOW (ref 8.9–10.3)
Chloride: 102 mmol/L (ref 98–111)
Creatinine, Ser: 1.1 mg/dL (ref 0.61–1.24)
GFR calc Af Amer: >60 mL/min (ref >60)
GFR calc non Af Amer: >60 mL/min (ref >60)
Glucose, Bld: 117 mg/dL — ABNORMAL HIGH (ref 70–99)
Potassium: 3.8 mmol/L (ref 3.5–5.1)
Sodium: 144 mmol/L (ref 135–145)

## 2019-03-12 LAB — ECHOCARDIOGRAM COMPLETE
Height: 70 in
Weight: 3880.1 oz

## 2019-03-12 MED ORDER — LORAZEPAM 2 MG/ML IJ SOLN
0.5000 mg | Freq: Once | INTRAMUSCULAR | Status: AC
  Administered 2019-03-12: 02:00:00 0.5 mg via INTRAVENOUS
  Filled 2019-03-12: qty 1

## 2019-03-12 MED ORDER — ADULT MULTIVITAMIN W/MINERALS CH
1.0000 | ORAL_TABLET | Freq: Every day | ORAL | Status: DC
  Administered 2019-03-12 – 2019-03-14 (×3): 1 via ORAL
  Filled 2019-03-12 (×3): qty 1

## 2019-03-12 MED ORDER — ENSURE ENLIVE PO LIQD
237.0000 mL | Freq: Two times a day (BID) | ORAL | Status: DC
  Administered 2019-03-12 – 2019-03-13 (×3): 237 mL via ORAL

## 2019-03-12 MED ORDER — PERFLUTREN LIPID MICROSPHERE
1.0000 mL | INTRAVENOUS | Status: AC | PRN
  Administered 2019-03-12: 2 mL via INTRAVENOUS
  Filled 2019-03-12: qty 10

## 2019-03-12 MED ORDER — NICOTINE 14 MG/24HR TD PT24
14.0000 mg | MEDICATED_PATCH | Freq: Every day | TRANSDERMAL | Status: DC
  Administered 2019-03-12 – 2019-03-14 (×3): 14 mg via TRANSDERMAL
  Filled 2019-03-12 (×3): qty 1

## 2019-03-12 NOTE — Progress Notes (Signed)
Orthopedic Tech Progress Note Patient Details:  Austin Gray 23-Jun-1932 [id number] Patient has arm sling Patient ID: Austin Mesi., male   DOB: February 10, 1932, 85 y.o.   MRN: [id number]   Austin Gray 03/12/2019, 8:18 AM

## 2019-03-12 NOTE — Progress Notes (Addendum)
Electrophysiology Rounding Note  Patient Name: Austin Gray. Date of Encounter: 03/12/2019  Primary Cardiologist: Hochrein Electrophysiologist: Austin Gray   Subjective   The patient is doing well today.  At this time, the patient denies any new concerns.  Shortness of breath continues to improve.   Inpatient Medications    Scheduled Meds: . atropine  1 mg Intravenous Once  . Chlorhexidine Gluconate Cloth  6 each Topical Daily  . furosemide  80 mg Intravenous Q8H  . potassium chloride  10 mEq Oral Daily  . sodium chloride flush  3 mL Intravenous Q12H   Continuous Infusions: . sodium chloride 10 mL/hr at 03/11/19 1529  . sodium chloride 10 mL/hr at 03/11/19 1202   PRN Meds: sodium chloride, sodium chloride, acetaminophen, Ipratropium-Albuterol, ondansetron (ZOFRAN) IV, sodium chloride flush   Vital Signs    Vitals:   03/12/19 0400 03/12/19 0500 03/12/19 0600 03/12/19 0749  BP:   132/82   Pulse: 70 70 69   Resp: (!) 25 (!) 21 (!) 22   Temp:    98.2 F (36.8 C)  TempSrc:    Oral  SpO2: 96% 98% 92%   Weight:  110 kg    Height:        Intake/Output Summary (Last 24 hours) at 03/12/2019 0834 Last data filed at 03/12/2019 0600 Gross per 24 hour  Intake 258.32 ml  Output 4225 ml  Net -3966.68 ml   Filed Weights   03/10/19 1304 03/11/19 0400 03/12/19 0500  Weight: 115.2 kg 121.1 kg 110 kg    Physical Exam    GEN- The patient is elderly appearing, alert and oriented x 3 today.   Head- normocephalic, atraumatic Eyes-  Sclera clear, conjunctiva pink Ears- hearing intact Oropharynx- clear Neck- supple Lungs- Clear to ausculation bilaterally, normal work of breathing Heart- Regular rate and rhythm (paced) GI- soft, NT, ND, + BS Extremities- no clubbing, cyanosis, 2+ BLE edema  Skin- no rash or lesion, left chest without hematoma Psych- euthymic mood, full affect Neuro- strength and sensation are intact  Labs    CBC Recent Labs    03/10/19 1244   03/11/19 0248 03/12/19 0250  WBC 8.1   < > 9.2 9.2  NEUTROABS 6.3  --   --   --   HGB 11.2*   < > 11.2* 11.8*  HCT 36.8*   < > 35.8* 37.3*  MCV 92.5   < > 89.7 89.9  PLT 79*   < > 82* 83*   < > = values in this interval not displayed.   Basic Metabolic Panel Recent Labs    03/10/19 2305 03/11/19 0248 03/12/19 0250  NA  --  141 144  K 4.0 3.9 3.8  CL  --  105 102  CO2  --  25 28  GLUCOSE  --  112* 117*  BUN  --  18 14  CREATININE  --  1.22 1.10  CALCIUM  --  8.7* 8.4*  MG 2.5*  --   --      Telemetry    AF, V pacing (personally reviewed)  Radiology    Dg Chest Portable 1 View  Result Date: 03/10/2019 CLINICAL DATA:  Shortness of breath and chest tightness. EXAM: PORTABLE CHEST 1 VIEW COMPARISON:  09/07/2017 FINDINGS: 1314 hours. The cardio pericardial silhouette is enlarged. There is pulmonary vascular congestion without overt pulmonary edema. Interstitial markings are diffusely coarsened with chronic features. Bibasilar atelectasis or infiltrate is similar. Stable right basilar nodule, potentially granuloma.  Bones are diffusely demineralized. Telemetry leads overlie the chest. IMPRESSION: Cardiomegaly with chronic interstitial changes and bibasilar atelectasis or infiltrate. Nodular opacity at the right base is stable and likely a granuloma but follow-up CT chest without contrast recommended to confirm. Electronically Signed   By: Misty Stanley M.D.   On: 03/10/2019 13:44    Assessment & Plan    1.  Complete heart block Doing well s/p pacemaker He is pacemaker dependent today Normal device function CXR pending Routine wound care and follow up  2.  Permanent atrial fibrillation Continue Xarelto long term for CHADS2VASC of 3 Resume Xarelto on 03/14/19  3.  HTN Stable No change required today  4.  Acute on chronic diastolic heart failure Likely 2/2 bradycardia He is diuresing well Continue IV Lasix Renal function stable Echo ordered  5.  MR  Updated echo  pending   EP Austin Gray sign off, Austin Gray transfer to general cardiology service tomorrow.  Transfer to telemetry, PT eval ordered.    For questions or updates, please contact [place] Please consult [email or web address] for contact info under Cardiology/STEMI.  Signed, Chanetta Marshall, NP  03/12/2019, 8:34 AM   I have seen and examined this patient with Chanetta Marshall.  Agree with above, note added to reflect my findings.  On exam, RRR, no murmurs, 2-3+ lower extremity edema.  Patient status post single-chamber pacemaker for complete heart block and atrial fibrillation.  Device functioning appropriately.  We Austin Gray plan for transfer to telemetry.  He was confused overnight.  Being out of the ICU may help with this.  Hopefully his family can come in and help with confusion.  He still has quite a bit of diuresis to go.  His legs are extremely swollen.  We Austin Gray continue with diuresis throughout the day today.     Austin Gray M. Austin Wrenn MD 03/12/2019 9:11 AM

## 2019-03-12 NOTE — Progress Notes (Signed)
Daughter at bedside, reports pt "dips snuff" frequently. Reports this may be why the pt became agitated last night. Dr. Eulas Post (EPP PA) notified. Nicotine patch ordered.

## 2019-03-12 NOTE — Progress Notes (Signed)
RN called radiology to schedule for patient to go down for his 2 view xray. RN told they would send for patient when they were ready.   When RN followed up with radiology, they stated they were still not ready for patient to go down and that they would call when the patient had been placed on the list to go.   Patient is currently resting and is stable.  RN to wait for radiology to call.   Kona Lover E Reola Mosher, [place]

## 2019-03-12 NOTE — Progress Notes (Signed)
Initial Nutrition Assessment  DOCUMENTATION CODES:   Obesity unspecified  INTERVENTION:    Ensure Enlive po BID, each supplement provides 350 kcal and 20 grams of protein  MVI daily  NUTRITION DIAGNOSIS:   Increased nutrient needs related to post-op healing as evidenced by estimated needs.  GOAL:   Patient will meet greater than or equal to 90% of their needs  MONITOR:   PO intake, Supplement acceptance, Labs, I & O's, Skin, Weight trends  REASON FOR ASSESSMENT:   Malnutrition Screening Tool    ASSESSMENT:   Patient with PMH significant for CHF, HTN, depression, diverticulosis, gastritis, and GERD. Presents this admission with bradycardia and CHF exacerbation.   8/25- PPM placement  Pt discussed during ICU rounds and with RN.   Spoke with pt at bedside. Denies loss of appetite PTA. States he typically eats three meals daily but is unable to elaborate on meal composition. Does not use supplementation at home but willing to try this admission. Tolerated 50% of his breakfast this am.   Pt endorses a UBW of 240 lb and denies recent wt loss. Per chart records pt's weight has fluctuated from 110 kg to 115 kg over the last year. NFPE done.   I/O: -11,600 ml since admit UOP: 4,815 ml x 24 hrs   Medications: 80 mg lasix TID, 10 mEq KCl daily Labs: Mg 2.5 (H)   Diet Order:   Diet Order            Diet Heart Room service appropriate? Yes; Fluid consistency: Thin  Diet effective now              EDUCATION NEEDS:   Education needs have been addressed  Skin:  Skin Assessment: Skin Integrity Issues: Skin Integrity Issues:: Incisions Incisions: L chest  Last BM:  8/22  Height:   Ht Readings from Last 1 Encounters:  03/10/19 5\' 10"  (1.778 m)    Weight:   Wt Readings from Last 1 Encounters:  03/12/19 110 kg    Ideal Body Weight:  75.5 kg  BMI:  Body mass index is 34.8 kg/m.  Estimated Nutritional Needs:   Kcal:  1850-2050 kcal  Protein:  90-105  grams  Fluid:  >/= 1.8 L/day   Mariana Single RD, LDN Clinical Nutrition Pager # - [phone]

## 2019-03-12 NOTE — Discharge Instructions (Signed)
° ° °  Supplemental Discharge Instructions for  Pacemaker/Defibrillator Patients  Activity No heavy lifting or vigorous activity with your left/right arm for 6 to 8 weeks.  Do not raise your left/right arm above your head for one week.  Gradually raise your affected arm as drawn below.           __            03/16/19                   03/17/19                    03/18/19                    03/19/19  NO DRIVING for  1 week      WOUND CARE - Keep the wound area clean and dry.  Do not get this area wet for one week. No showers for one week; you may shower on   03/19/19  . - The tape/steri-strips on your wound will fall off; do not pull them off.  No bandage is needed on the site.  DO  NOT apply any creams, oils, or ointments to the wound area. - If you notice any drainage or discharge from the wound, any swelling or bruising at the site, or you develop a fever > 101? F after you are discharged home, call the office at once.  Special Instructions - You are still able to use cellular telephones; use the ear opposite the side where you have your pacemaker/defibrillator.  Avoid carrying your cellular phone near your device. - When traveling through airports, show security personnel your identification card to avoid being screened in the metal detectors.  Ask the security personnel to use the hand wand. - Avoid arc welding equipment, MRI testing (magnetic resonance imaging), TENS units (transcutaneous nerve stimulators).  Call the office for questions about other devices. - Avoid electrical appliances that are in poor condition or are not properly grounded. - Microwave ovens are safe to be near or to operate.

## 2019-03-13 DIAGNOSIS — E876 Hypokalemia: Secondary | ICD-10-CM

## 2019-03-13 DIAGNOSIS — I10 Essential (primary) hypertension: Secondary | ICD-10-CM

## 2019-03-13 LAB — BASIC METABOLIC PANEL
Anion gap: 11 (ref 5–15)
BUN: 15 mg/dL (ref 8–23)
CO2: 32 mmol/L (ref 22–32)
Calcium: 8.4 mg/dL — ABNORMAL LOW (ref 8.9–10.3)
Chloride: 99 mmol/L (ref 98–111)
Creatinine, Ser: 0.88 mg/dL (ref 0.61–1.24)
GFR calc Af Amer: >60 mL/min (ref >60)
GFR calc non Af Amer: >60 mL/min (ref >60)
Glucose, Bld: 118 mg/dL — ABNORMAL HIGH (ref 70–99)
Potassium: 3.2 mmol/L — ABNORMAL LOW (ref 3.5–5.1)
Sodium: 142 mmol/L (ref 135–145)

## 2019-03-13 LAB — CBC
HCT: 37.7 % — ABNORMAL LOW (ref 39.0–52.0)
Hemoglobin: 11.7 g/dL — ABNORMAL LOW (ref 13.0–17.0)
MCH: 28.1 pg (ref 26.0–34.0)
MCHC: 31 g/dL (ref 30.0–36.0)
MCV: 90.6 fL (ref 80.0–100.0)
Platelets: 93 10*3/uL — ABNORMAL LOW (ref 150–400)
RBC: 4.16 MIL/uL — ABNORMAL LOW (ref 4.22–5.81)
RDW: 15.1 % (ref 11.5–15.5)
WBC: 9.2 10*3/uL (ref 4.0–10.5)
nRBC: 0 % (ref 0.0–0.2)

## 2019-03-13 MED ORDER — POTASSIUM CHLORIDE CRYS ER 20 MEQ PO TBCR
20.0000 meq | EXTENDED_RELEASE_TABLET | Freq: Two times a day (BID) | ORAL | Status: DC
  Administered 2019-03-13 – 2019-03-14 (×3): 20 meq via ORAL
  Filled 2019-03-13 (×3): qty 1

## 2019-03-13 NOTE — Evaluation (Signed)
Physical Therapy Evaluation Patient Details Name: Austin Gray. MRN: [id number] DOB: [birth year] Today's Date: 03/13/2019   History of Present Illness  85 year old male with a history of atrial fibrillation on chronic anticoagulation with Xarelto, hypertension and chronic systolic and diastolic heart failure.  Admitted 03/10/19 by cardiology service for bradycardia and CHF exacerbation. Temporary Pacemaker placed 03/10/19  Clinical Impression  PTA pt living with daughter and grandchildren in mobile home with 6 steps to enter. Pt reports independence with ambulation although the last week or 2 he has used a cane due to his weakness. Pt independent in ADLs. Pt currently limited in safe mobility by oxygen desaturation (see General Comments) in presence of generalized deconditioning. Pt is min A for bed mobility and transfers and min guard for ambulation of 250 feet with RW. PT recommending HHPT level rehab at discharge. PT will continue to follow acutely.     Follow Up Recommendations Home health PT;Supervision for mobility/OOB    Equipment Recommendations  None recommended by PT    Recommendations for Other Services OT consult     Precautions / Restrictions Precautions Precautions: ICD/Pacemaker Precaution Comments: watch O2      Mobility  Bed Mobility Overal bed mobility: Needs Assistance Bed Mobility: Supine to Sit     Supine to sit: Min assist     General bed mobility comments: minA for pt to pull up against therapist with R UE  Transfers Overall transfer level: Needs assistance Equipment used: Rolling walker (2 wheeled) Transfers: Sit to/from Stand Sit to Stand: Min assist         General transfer comment: min A for power up, vc for using R UE to push up from bed   Ambulation/Gait Ambulation/Gait assistance: Min guard Gait Distance (Feet): 250 Feet Assistive device: Rolling walker (2 wheeled) Gait Pattern/deviations: Step-through pattern;Decreased step length  - right;Decreased step length - left;Shuffle Gait velocity: slowed Gait velocity interpretation: <1.31 ft/sec, indicative of household ambulator General Gait Details: pt able to utilize RW with only R UE, min guard for safety, pt with slow, steady shuffling gait      Balance Overall balance assessment: Needs assistance Sitting-balance support: Feet supported;No upper extremity supported Sitting balance-Leahy Scale: Good     Standing balance support: No upper extremity supported;During functional activity Standing balance-Leahy Scale: Fair                               Pertinent Vitals/Pain Pain Assessment: Faces Faces Pain Scale: Hurts a little bit Pain Location: low back Pain Descriptors / Indicators: Grimacing Pain Intervention(s): Limited activity within patient's tolerance;Monitored during session;Repositioned    Home Living Family/patient expects to be discharged to:: Private residence Living Arrangements: Children Available Help at Discharge: Family;Available PRN/intermittently Type of Home: Mobile home Home Access: Stairs to enter Entrance Stairs-Rails: Can reach both Entrance Stairs-Number of Steps: 6 Home Layout: One level Home Equipment: Cane - single point      Prior Function Level of Independence: Independent         Comments: ambulates limited community distances, independent in ADLs        Extremity/Trunk Assessment   Upper Extremity Assessment Upper Extremity Assessment: LUE deficits/detail LUE Deficits / Details: ROM limited by pacemaker precautions    Lower Extremity Assessment Lower Extremity Assessment: RLE deficits/detail;LLE deficits/detail RLE Deficits / Details: ROM WFL, strength grossly 4/5 RLE Sensation: WNL RLE Coordination: decreased fine motor LLE Deficits / Details: ROM WFL, strength grossly  4/5 LLE Sensation: WNL LLE Coordination: decreased fine motor       Communication   Communication: HOH  Cognition  Arousal/Alertness: Awake/alert Behavior During Therapy: WFL for tasks assessed/performed Overall Cognitive Status: Within Functional Limits for tasks assessed                                        General Comments General comments (skin integrity, edema, etc.): at rest SaO2 on RA 94%O2, with ambulation SaO2 dropped to 78%O2, pt able to maintain SaO2 90-98%on 3L O2 via [place], pt with 3/4 DoE with ambulation, pt continues to have LE edema         Assessment/Plan    PT Assessment Patient needs continued PT services  PT Problem List Decreased strength;Decreased activity tolerance;Cardiopulmonary status limiting activity;Decreased mobility       PT Treatment Interventions DME instruction;Gait training;Functional mobility training;Therapeutic activities;Therapeutic exercise;Balance training;Cognitive remediation;Patient/family education    PT Goals (Current goals can be found in the Care Plan section)  Acute Rehab PT Goals Patient Stated Goal: feel better PT Goal Formulation: With patient Time For Goal Achievement: 03/27/19 Potential to Achieve Goals: Good    Frequency Min 3X/week    AM-PAC PT "6 Clicks" Mobility  Outcome Measure Help needed turning from your back to your side while in a flat bed without using bedrails?: None Help needed moving from lying on your back to sitting on the side of a flat bed without using bedrails?: A Little Help needed moving to and from a bed to a chair (including a wheelchair)?: A Little Help needed standing up from a chair using your arms (e.g., wheelchair or bedside chair)?: A Little     6 Click Score: 13    End of Session Equipment Utilized During Treatment: Gait belt;Oxygen Activity Tolerance: Patient tolerated treatment well Patient left: in chair;with call bell/phone within reach;with chair alarm set Nurse Communication: Mobility status;Other (comment)(oxygen desaturation ) PT Visit Diagnosis: Unsteadiness on feet  (R26.81);Other abnormalities of gait and mobility (R26.89);Muscle weakness (generalized) (M62.81);Difficulty in walking, not elsewhere classified (R26.2)    Time: 1315-1340 PT Time Calculation (min) (ACUTE ONLY): 25 min   Charges:   PT Evaluation $PT Eval Moderate Complexity: 1 Mod PT Treatments $Gait Training: 8-22 mins        Brookelyn Gaynor B. Migdalia Dk PT, DPT Acute Rehabilitation Services Pager [phone] Office [phone]   [place] 03/13/2019, 2:14 PM

## 2019-03-13 NOTE — Progress Notes (Signed)
SATURATION QUALIFICATIONS: (This note is used to comply with regulatory documentation for home oxygen)  Patient Saturations on Room Air at Rest = 96%  Patient Saturations on Room Air while Ambulating = 78%  Patient Saturations on 3 Liters of oxygen while Ambulating = 92%  Please briefly explain why patient needs home oxygen: Pt requires 3L supplemental O2 via [place] to maintain SaO2 >90%O2 with ambulation.  Annalyn Blecher B. Migdalia Dk PT, DPT Acute Rehabilitation Services Pager [phone] Office [phone]

## 2019-03-13 NOTE — Progress Notes (Addendum)
Progress Note  Patient Name: Elza Orn. Date of Encounter: 03/13/2019  Primary Cardiologist: Minus Breeding, MD  EP: Dr. Curt Bears  Subjective   Breathing improving but worse with exertion. No chest pain. Feels fatigued.   Inpatient Medications    Scheduled Meds: . feeding supplement (ENSURE ENLIVE)  237 mL Oral BID BM  . furosemide  80 mg Intravenous Q8H  . multivitamin with minerals  1 tablet Oral Daily  . nicotine  14 mg Transdermal Daily  . potassium chloride  10 mEq Oral Daily  . sodium chloride flush  3 mL Intravenous Q12H   Continuous Infusions: . sodium chloride 10 mL/hr at 03/11/19 1529  . sodium chloride 10 mL/hr at 03/11/19 1202   PRN Meds: sodium chloride, sodium chloride, acetaminophen, Ipratropium-Albuterol, ondansetron (ZOFRAN) IV, sodium chloride flush   Vital Signs    Vitals:   03/12/19 1939 03/12/19 2000 03/12/19 2147 03/13/19 0500  BP:  (!) 143/69 140/71 (!) 155/85  Pulse:  74 67 67  Resp:  (!) 28 (!) 26 (!) 24  Temp: 97.8 F (36.6 C)  (!) 97.5 F (36.4 C) 97.8 F (36.6 C)  TempSrc: Oral  Oral Oral  SpO2:  98% 93% 90%  Weight:    112.7 kg  Height:        Intake/Output Summary (Last 24 hours) at 03/13/2019 0852 Last data filed at 03/13/2019 0500 Gross per 24 hour  Intake 240 ml  Output 3270 ml  Net -3030 ml   Last 3 Weights 03/13/2019 03/12/2019 03/11/2019  Weight (lbs) 248 lb 7.3 oz 242 lb 8.1 oz 266 lb 15.6 oz  Weight (kg) 112.7 kg 110 kg 121.1 kg      Telemetry    Atrial fibrillation with intermittent PVCs - Personally Reviewed  ECG    No new tracing   Physical Exam   GEN: No acute distress with sling   Neck: + JVD Cardiac: IR Ir , no murmurs, rubs, or gallops. 1+ BL LE edema Respiratory: Clear to auscultation bilaterally. GI: Soft, nontender, non-distended  MS:  No deformity. Neuro:  Nonfocal  Psych: Normal affect   Labs    High Sensitivity Troponin:  No results for input(s): TROPONINIHS in the last 720 hours.     Chemistry Recent Labs  Lab 03/11/19 0248 03/12/19 0250 03/13/19 0335  NA 141 144 142  K 3.9 3.8 3.2*  CL 105 102 99  CO2 25 28 32  GLUCOSE 112* 117* 118*  BUN 18 14 15   CREATININE 1.22 1.10 0.88  CALCIUM 8.7* 8.4* 8.4*  GFRNONAA 54* >60 >60  GFRAA >60 >60 >60  ANIONGAP 11 14 11      Hematology Recent Labs  Lab 03/11/19 0248 03/12/19 0250 03/13/19 0335  WBC 9.2 9.2 9.2  RBC 3.99* 4.15* 4.16*  HGB 11.2* 11.8* 11.7*  HCT 35.8* 37.3* 37.7*  MCV 89.7 89.9 90.6  MCH 28.1 28.4 28.1  MCHC 31.3 31.6 31.0  RDW 15.5 15.3 15.1  PLT 82* 83* 93*    BNP Recent Labs  Lab 03/10/19 1244 03/10/19 1707  BNP 266.5* 267.6*     DDimer No results for input(s): DDIMER in the last 168 hours.   Radiology    Dg Chest 2 View  Result Date: 03/12/2019 CLINICAL DATA:  New pacemaker insertion. EXAM: CHEST - 2 VIEW COMPARISON:  Portable chest 03/10/2019 FINDINGS: Pacemaker has now been inserted. Single lead in place. Cardiomegaly. Pulmonary vascular congestion. Slight interstitial edema and small bilateral pleural effusions. No bone abnormality  of significance. IMPRESSION: Pacemaker in place. Slight pulmonary edema with small bilateral pleural effusions. Electronically Signed   By: Lorriane Shire M.D.   On: 03/12/2019 11:10    Cardiac Studies   Echo 03/12/2019 IMPRESSIONS   1. The left ventricle has normal systolic function with an ejection fraction of 60-65%. The cavity size was normal. There is mildly increased left ventricular wall thickness. Left ventricular diastolic Doppler parameters are indeterminate. No evidence  of left ventricular regional wall motion abnormalities.  2. The right ventricle has normal systolic function. The cavity was normal. There is no increase in right ventricular wall thickness.  3. Left atrial size was severely dilated.  4. Right atrial size was severely dilated.  5. No evidence of mitral valve stenosis. Mild eccentric regurgitation.  6. The aortic  valve is tricuspid. Mild calcification of the aortic valve. No stenosis of the aortic valve.  7. The aorta is normal unless otherwise noted.  8. The aortic root is normal in size and structure.  9. The inferior vena cava was dilated in size with <50% respiratory variability. PA systolic pressure 60 mmHg.  PACEMAKER IMPLANT 03/11/2019 CONCLUSIONS:   1. Successful implantation of a [employer]urity MRI dual-chamber pacemaker for symptomatic bradycardia  2. No early apparent complications.      Patient Profile     85 y.o. male Mr. Chrystine Oiler is an 85 year old male with a history of atrial fibrillation on chronic anticoagulation with Xarelto, hypertension and chronic diastolic CHF presented [id number] for bradycardia, dyspnea on exertion, and bilateral lower extremity edema. Symptoms progressively worsen in past 1 month.   Assessment & Plan    1. Symptomatic bradycardia - Witnessed 17 and 7-second pause by Dr. Meda Coffee. Was not on any AV blocking agent.  Underwent temporary pacemaker and eventually successful implantation of a [employer] Medical Assurity MRI dual-chamber pacemaker. Normal device function.   2. Permanent atrial fibrillation - Rate controlled. Plan to resume Xarelto 03/14/2019.No bleeding issue.   3. Acute on chronic diastolic CHF - felt 2nd to bradycardia. Echo showed LVEF of 60-65%, no WM abnormality, severely dilated LA. Diuresed 13.8L so far. Weight down 6 lb (254>>248lb). Volume overloaded on exam. Continue IV lasix. Add potassium.  4. Hypokalemia - Will supplement with higher dose  5. HTN - Elevated - Home amlodipine and losartan on hold - Suspect some of his edema could be due to amlodipine - Will review with MD to restart medications at low dose  For questions or updates, please contact [place] Please consult [email or web address] for contact info under        Signed, Leanor Kail, PA  03/13/2019, 8:52 AM    Personally seen and examined. Agree with above.    Feeling better but still not at baseline.  Fatigue.  Lets continue with high intensity furosemide 80 mg every 8 hours.  He did very well with diuresis again.  Lets continue to hold his antihypertensives as we continue to diurese him aggressively.  He still has more to go IV.  Candee Furbish, MD

## 2019-03-14 DIAGNOSIS — I10 Essential (primary) hypertension: Secondary | ICD-10-CM

## 2019-03-14 DIAGNOSIS — I4821 Permanent atrial fibrillation: Secondary | ICD-10-CM

## 2019-03-14 DIAGNOSIS — E876 Hypokalemia: Secondary | ICD-10-CM

## 2019-03-14 DIAGNOSIS — I5033 Acute on chronic diastolic (congestive) heart failure: Secondary | ICD-10-CM

## 2019-03-14 LAB — BASIC METABOLIC PANEL
Anion gap: 12 (ref 5–15)
BUN: 13 mg/dL (ref 8–23)
CO2: 34 mmol/L — ABNORMAL HIGH (ref 22–32)
Calcium: 8.6 mg/dL — ABNORMAL LOW (ref 8.9–10.3)
Chloride: 98 mmol/L (ref 98–111)
Creatinine, Ser: 0.75 mg/dL (ref 0.61–1.24)
GFR calc Af Amer: >60 mL/min (ref >60)
GFR calc non Af Amer: >60 mL/min (ref >60)
Glucose, Bld: 111 mg/dL — ABNORMAL HIGH (ref 70–99)
Potassium: 3.3 mmol/L — ABNORMAL LOW (ref 3.5–5.1)
Sodium: 144 mmol/L (ref 135–145)

## 2019-03-14 LAB — CBC
HCT: 40.7 % (ref 39.0–52.0)
Hemoglobin: 12.7 g/dL — ABNORMAL LOW (ref 13.0–17.0)
MCH: 28 pg (ref 26.0–34.0)
MCHC: 31.2 g/dL (ref 30.0–36.0)
MCV: 89.8 fL (ref 80.0–100.0)
Platelets: 107 10*3/uL — ABNORMAL LOW (ref 150–400)
RBC: 4.53 MIL/uL (ref 4.22–5.81)
RDW: 15 % (ref 11.5–15.5)
WBC: 10.3 10*3/uL (ref 4.0–10.5)
nRBC: 0 % (ref 0.0–0.2)

## 2019-03-14 MED ORDER — ENSURE ENLIVE PO LIQD: 237.0000 mL | Freq: Two times a day (BID) | ORAL | 12 refills | Status: DC

## 2019-03-14 MED ORDER — POTASSIUM CHLORIDE CRYS ER 20 MEQ PO TBCR
40.0000 meq | EXTENDED_RELEASE_TABLET | Freq: Once | ORAL | Status: AC
  Administered 2019-03-14: 40 meq via ORAL
  Filled 2019-03-14: qty 2

## 2019-03-14 NOTE — Progress Notes (Signed)
VAST to pt's bedside to start PIV. Pt's nurse, Rey advised that he was able to start PIV.

## 2019-03-14 NOTE — TOC Initial Note (Signed)
Transition of Care (TOC) - Initial/Assessment Note    Patient Details  Name: Austin Gray. MRN: [id number] Date of Birth: 03-22-1932  Transition of Care [facility]) CM/SW Contact:    Bethena Roys, RN Phone Number: 03/14/2019, 3:24 PM  Clinical Narrative: Pt presented for bradycardia. PTA from home with family support. Staff RN states patient on room air and will not need 02 for transport home. Patient in need of [place] RN and PT services. Patient chose [facility]-referral sent to [facility] and [facility] to begin within 24-48 hours post transition home. No further needs from CM at this time.                   Expected Discharge Plan: [place] Barriers to Discharge: No Barriers Identified   Patient Goals and CMS Choice Patient states their goals for this hospitalization and ongoing recovery are:: "to return home"   Choice offered to / list presented to : NA  Expected Discharge Plan and Services Expected Discharge Plan: [place] In-house Referral: NA Discharge Planning Services: CM Consult Post Acute Care Choice: [place] arrangements for the past 2 months: [place] Expected Discharge Date: 03/14/19                         HH Arranged: RN, Disease Management, PT HH Agency: [facility] Date [facility] Agency Contacted: 03/14/19 Time HH Agency Contacted: 43 Representative spoke with at [place]: Dorian Pod  Prior Living Arrangements/Services Living arrangements for the past 2 months: [place] Lives with:: Relatives Patient language and need for interpreter reviewed:: Yes Do you feel safe going back to the place where you live?: Yes      Need for Family Participation in Patient Care: Yes (Comment) Care giver support system in place?: Yes (comment)   Criminal Activity/Legal Involvement Pertinent to Current Situation/Hospitalization: No - Comment as needed  Activities of Daily Living Home Assistive  Devices/Equipment: Grab bars around toilet ADL Screening (condition at time of admission) Patient's cognitive ability adequate to safely complete daily activities?: Yes Is the patient deaf or have difficulty hearing?: Yes Does the patient have difficulty seeing, even when wearing glasses/contacts?: Yes Does the patient have difficulty concentrating, remembering, or making decisions?: Yes Patient able to express need for assistance with ADLs?: Yes Does the patient have difficulty dressing or bathing?: Yes Independently performs ADLs?: No Does the patient have difficulty walking or climbing stairs?: Yes Weakness of Legs: Both Weakness of Arms/Hands: Both  Permission Sought/Granted Permission sought to share information with : Family Supports, Chartered certified accountant granted to share information with : Yes, Verbal Permission Granted     Permission granted to share info w AGENCY: [facility]        Emotional Assessment Appearance:: Appears stated age Attitude/Demeanor/Rapport: Engaged Affect (typically observed): Accepting Orientation: : Oriented to Self, Oriented to Place, Oriented to  Time, Oriented to Situation Alcohol / Substance Use: Not Applicable Psych Involvement: No (comment)  Admission diagnosis:  Shortness of breath [R06.02] Bradycardia [R00.1] Patient Active Problem List   Diagnosis Date Noted  . Heart block AV complete ([place])   . PAF (paroxysmal atrial fibrillation) ([place]) 09/28/2018  . Dyslipidemia 09/28/2018  . Chronic diastolic heart failure ([place]) 11/23/2017  . AKI (acute kidney injury) ([place]) 11/23/2017  . Morbid obesity ([place]) 11/23/2017  . Leg swelling 11/23/2017  . Urinary tract infection 09/07/2017  .  Hyponatremia 09/07/2017  . Dyspnea 09/07/2017  . Bradycardia 09/05/2017  . Non-rheumatic mitral regurgitation 02/22/2017  . Atrial fibrillation, persistent 09/22/2015  . Systolic murmur [id number]  . Lower GI bleeding 02/15/2012   . Hypotension 02/15/2012  . History of BPH   . Diverticulosis   . Colon polyps   . Visual impairment   . Hypertension   . Acute blood loss anemia    PCP:  [name], MD Pharmacy:   [id number] - [place], [place] [address] [place] [address] Phone: [phone] Fax: [phone]  [facility] - [place], [place] [address] Phone: [phone] Fax: [phone]     Social Determinants of Health (SDOH) Interventions    Readmission Risk Interventions No flowsheet data found.

## 2019-03-14 NOTE — Progress Notes (Signed)
VAST consulted for STAT placement of PIV. However, documentation shows 2 PIVs in place, labs stable, and minimal meds/fluids running. Called unit and spoke with pt's nurse, Ray who advised need is not STAT; night shift placed order. Pt needs an IV before 1400 Lasix dose.

## 2019-03-14 NOTE — Discharge Summary (Addendum)
Discharge Summary    Patient ID: Austin Gray.,  MRN: [id number], DOB/AGE: 1931-10-22 85 y.o.  Admit date: 03/10/2019 Discharge date: 03/14/2019  Primary Care Provider: Jenna Luo T Primary Cardiologist: Minus Breeding, MD   Discharge Diagnoses    Active Problems:   Bradycardia   Heart block AV complete ([place])   Allergies No Known Allergies  Diagnostic Studies/Procedures    ECHO: 03/12/2019  1. The left ventricle has normal systolic function with an ejection fraction of 60-65%. The cavity size was normal. There is mildly increased left ventricular wall thickness. Left ventricular diastolic Doppler parameters are indeterminate. No evidence  of left ventricular regional wall motion abnormalities.  2. The right ventricle has normal systolic function. The cavity was normal. There is no increase in right ventricular wall thickness.  3. Left atrial size was severely dilated.  4. Right atrial size was severely dilated.  5. No evidence of mitral valve stenosis. Mild eccentric regurgitation.  6. The aortic valve is tricuspid. Mild calcification of the aortic valve. No stenosis of the aortic valve.  7. The aorta is normal unless otherwise noted.  8. The aortic root is normal in size and structure.  9. The inferior vena cava was dilated in size with <50% respiratory variability. PA systolic pressure 60 mmHg.   PROCEDURES: 03/11/2019  1. Pacemaker implantation.    INTRODUCTION:  Austin Gray. is a 85 y.o. male with a history of bradycardia who presents today for pacemaker implantation.  The patient reports intermittent episodes of dizziness over the past few months.  No reversible causes have been identified.  The patient therefore presents today for pacemaker implantation.     DESCRIPTION OF PROCEDURE:  Informed written consent was obtained, and  the patient was brought to the electrophysiology lab in a fasting state.  The patient required no sedation for the  procedure today.  The patients left chest was prepped and draped in the usual sterile fashion by the EP lab staff. The skin overlying the left deltopectoral region was infiltrated with lidocaine for local analgesia.  A 4-cm incision was made over the left deltopectoral region.  A left subcutaneous pacemaker pocket was fashioned using a combination of sharp and blunt dissection. Electrocautery was required to assure hemostasis.    RV Lead Placement: The left axillary vein was therefore cannulated.  Through the left axillary vein, a St Jude Medical model Tendril MRI [id number] (serial number  [id number]) right ventricular lead was advanced with fluoroscopic visualization into the right ventricular apex position.  Initial right ventricular lead R-waves measured 24.4 paced mV with an impedance of 430 ohms and a threshold of 0.8 V at 0.5 msec.  The lead was secured to the pectoralis fascia using #2-0 silk over the suture sleeves.   Device Placement:  The leads were then connected to a Wallace MRI  model [id number] (serial number  [id number] ) pacemaker.  The pocket was irrigated with copious gentamicin solution.  The pacemaker was then placed into the pocket.  The pocket was then closed in 3 layers with 2.0 Vicryl suture for the 3.0 Vicryl suture subcutaneous and subcuticular layers.  Steri-  Strips and a sterile dressing were then applied. EBL<55ml.  There were no early apparent complications.     CONCLUSIONS:   1. Successful implantation of a St Jude Medical Assurity MRI dual-chamber pacemaker for symptomatic bradycardia  2. No early apparent complications.   TEMPORARY PACEMAKER 03/11/2019 Technical Details Indication: 85 yo  WM presents with complete heart block and CHF  Procedure description: The right groin was prepped and draped in a sterile fashion. Local anesthesia was provided with 1% Lidocaine. Using a modified Seldinger technique a 5 French sheath was inserted into the right femoral  vein. Under fluoroscopic guidance a balloon tip pacing wire was advanced via the sheath into the right ventricular apex. Adequate Pacemaker capture and thresholds were documented. The right atrium was large and the pacing lead had to be repositioned after initial placement. The sheath was sutured in place. The patient was transferred to the ICU for further monitoring. There were no complications.   Estimated blood loss <50 mL.   During this procedure no sedation was administered.   _____________   History of Present Illness     85 yo male w/ hx HTN, permanent Afib, MR, was admitted 08/23 with increasing DOE and bradycardia.   Hospital Course     Consultants: EP   His HR was was in the 30s, complete heart block.  He also had a 17-second pause in the ER which was symptomatic, with confusion.  He was taken to the lab and a temp wire was placed.  A temp wire rate had to be increased overnight to prevent hypotension.  He was seen by EP, and had a [place] dual lead pacemaker implanted without complication.  He is off AV nodal blocking agents.  His Xarelto was held, but he is to resume that on 8/27.  He had no bleeding issues.  Post-procedure chest x-ray showed no pneumothorax.  He was volume overloaded, and required IV Lasix 80 mg every 8 hours for diuresis.  The acute on chronic diastolic CHF was felt secondary to the bradycardia.  He diuresed almost 18 L and his weight came down 16 pounds.    He had hypokalemia secondary to diuresis and his potassium was supplemented.   His discharge weight is 238 pounds.  He is to resume his home Lasix dosing of 80 mg a.m. and 40 mg p.m.  His blood pressure was elevated at times.  His home amlodipine, cardura and losartan had been held. The losartan was restarted at discharge. Restart amlodipine and/or Cardura at follow-up as indicated.  _____________  Discharge Vitals Blood pressure (!) 151/65, pulse 70, temperature 98.1 F (36.7 C), temperature  source Oral, resp. rate 20, height 5\' 10"  (1.778 m), weight 108.1 kg, SpO2 91 %.  Filed Weights   03/12/19 0500 03/13/19 0500 03/14/19 0409  Weight: 110 kg 112.7 kg 108.1 kg    Labs & Radiologic Studies    CBC Recent Labs    03/13/19 0335 03/14/19 0345  WBC 9.2 10.3  HGB 11.7* 12.7*  HCT 37.7* 40.7  MCV 90.6 89.8  PLT 93* [id number]*   Basic Metabolic Panel Recent Labs    03/13/19 0335 03/14/19 0345  NA 142 144  K 3.2* 3.3*  CL 99 98  CO2 32 34*  GLUCOSE 118* 111*  BUN 15 13  CREATININE 0.88 0.75  CALCIUM 8.4* 8.6*   BNP    Component Value Date/Time   BNP 267.6 (H) 03/10/2019 1707   BNP 172.2 (H) 11/25/2016 1224   _____________  Dg Chest 2 View  Result Date: 03/12/2019 CLINICAL DATA:  New pacemaker insertion. EXAM: CHEST - 2 VIEW COMPARISON:  Portable chest 03/10/2019 FINDINGS: Pacemaker has now been inserted. Single lead in place. Cardiomegaly. Pulmonary vascular congestion. Slight interstitial edema and small bilateral pleural effusions. No bone abnormality of significance. IMPRESSION: Pacemaker in  place. Slight pulmonary edema with small bilateral pleural effusions. Electronically Signed   By: Lorriane Shire M.D.   On: 03/12/2019 11:10   Dg Chest Portable 1 View  Result Date: 03/10/2019 CLINICAL DATA:  Shortness of breath and chest tightness. EXAM: PORTABLE CHEST 1 VIEW COMPARISON:  09/07/2017 FINDINGS: 1314 hours. The cardio pericardial silhouette is enlarged. There is pulmonary vascular congestion without overt pulmonary edema. Interstitial markings are diffusely coarsened with chronic features. Bibasilar atelectasis or infiltrate is similar. Stable right basilar nodule, potentially granuloma. Bones are diffusely demineralized. Telemetry leads overlie the chest. IMPRESSION: Cardiomegaly with chronic interstitial changes and bibasilar atelectasis or infiltrate. Nodular opacity at the right base is stable and likely a granuloma but follow-up CT chest without contrast  recommended to confirm. Electronically Signed   By: Misty Stanley M.D.   On: 03/10/2019 13:44   Disposition   Pt is being discharged home today in good condition.  Follow-up Plans & Appointments    Follow-up Information    Baldwin Jamaica, PA-C Follow up on 03/27/2019.   Specialty: Cardiology Why: at [facility] information: [address] [place] [address] [phone]        Constance Haw, MD Follow up on 06/11/2019.   Specialty: Cardiology Why: at [facility] information: [address] [place] [address] [phone]        Almyra Deforest, [place] Follow up on 03/27/2019.   Specialties: Cardiology, Radiology Why: PA for Dr Percival Spanish. Please arrive at 9 am for a 9:15 am appt. Contact information: [address] [place] [address] [phone]          Discharge Instructions    Diet - low sodium heart healthy   Complete by: As directed    Increase activity slowly   Complete by: As directed       Discharge Medications   Allergies as of 03/14/2019   No Known Allergies     Medication List    STOP taking these medications   amLODipine 10 MG tablet Commonly known as: NORVASC   doxazosin 4 MG tablet Commonly known as: CARDURA     TAKE these medications   albuterol 108 (90 Base) MCG/ACT inhaler Commonly known as: Ventolin HFA INHALE 2 PUFFS BY MOUTH EVERY 6 HOURS AS NEEDED FOR WHEEZING OR SHORTNESS OF BREATH   feeding supplement (ENSURE ENLIVE) Liqd Take 237 mLs by mouth 2 (two) times daily between meals.   ferrous sulfate 325 (65 FE) MG tablet Take 325 mg by mouth every other Gray.   fluticasone 50 MCG/ACT nasal spray Commonly known as: FLONASE PLACE 1 SPRAY INTO BOTH NOSTRILS DAILY. What changed:   when to take this  reasons to take this   furosemide 40 MG tablet Commonly known as: LASIX TAKE 2 TABLETS (80MG ) EVERY MORNING AND 1 TABLET (40MG ) EVERY EVENING What changed: See the new  instructions.   losartan 50 MG tablet Commonly known as: COZAAR Take 1 tablet (50 mg total) by mouth daily.   multivitamin tablet Take 1 tablet by mouth daily.   multivitamin-lutein Caps capsule Take 1 capsule by mouth daily.   potassium chloride 10 MEQ tablet Commonly known as: K-DUR TAKE 1 TABLET EVERY Gray   Xarelto 20 MG Tabs tablet Generic drug: rivaroxaban TAKE 1 TABLET EVERY Gray WITH SUPPER What changed: See the new instructions.         Outstanding Labs/Studies   None  Duration of Discharge Encounter   Greater  than 30 minutes including physician time.  Alcario Drought Barrett NP 03/14/2019, 11:44 AM   Personally seen and examined. Agree with above.   Primary Cardiologist: Minus Breeding, MD   Subjective   States that he is ready to go.  We will discontinue his Foley.  Breathing much improved.  Inpatient Medications    Scheduled Meds: . feeding supplement (ENSURE ENLIVE)  237 mL Oral BID BM  . furosemide  80 mg Intravenous Q8H  . multivitamin with minerals  1 tablet Oral Daily  . nicotine  14 mg Transdermal Daily  . potassium chloride  20 mEq Oral BID  . sodium chloride flush  3 mL Intravenous Q12H   Continuous Infusions: . sodium chloride 10 mL/hr at 03/11/19 1529  . sodium chloride 10 mL/hr at 03/11/19 1202   PRN Meds: sodium chloride, sodium chloride, acetaminophen, Ipratropium-Albuterol, ondansetron (ZOFRAN) IV, sodium chloride flush   Vital Signs          Vitals:   03/13/19 1442 03/13/19 2012 03/14/19 0409 03/14/19 0905  BP: 131/78 (!) 150/70 (!) 151/65   Pulse: 76 70 70   Resp: 17 (!) 22 (!) 21 20  Temp: 97.7 F (36.5 C) 98.2 F (36.8 C) 98.1 F (36.7 C)   TempSrc: Oral Oral Oral   SpO2: 96% 96% (!) 88% 91%  Weight:   108.1 kg   Height:        Intake/Output Summary (Last 24 hours) at 03/14/2019 1000 Last data filed at 03/14/2019 0630    Gross per 24 hour  Intake 203 ml  Output 3900 ml  Net -3697 ml    Last 3 Weights 03/14/2019 03/13/2019 03/12/2019  Weight (lbs) 238 lb 4.8 oz 248 lb 7.3 oz 242 lb 8.1 oz  Weight (kg) 108.092 kg 112.7 kg 110 kg      Telemetry    atrial fibrillation with paced beats noted- Personally Reviewed  ECG    No new- Personally Reviewed  Physical Exam  Elderly GEN:No acute distress.   Neck:No JVD Cardiac: Irregularly irregular, no murmurs, rubs, or gallops.  Respiratory:Clear to auscultation bilaterally. [id number], nontender, non-distended slightly protuberant MS:No edema; No deformity.  Foley catheter noted Neuro:Nonfocal  Psych: Normal affect   Labs    High Sensitivity Troponin:   Last Labs   No results for input(s): TROPONINIHS in the last 720 hours.      Chemistry Last Labs        Recent Labs  Lab 03/12/19 0250 03/13/19 0335 03/14/19 0345  NA 144 142 144  K 3.8 3.2* 3.3*  CL 102 99 98  CO2 28 32 34*  GLUCOSE 117* 118* 111*  BUN 14 15 13   CREATININE 1.10 0.88 0.75  CALCIUM 8.4* 8.4* 8.6*  GFRNONAA >60 >60 >60  GFRAA >60 >60 >60  ANIONGAP 14 11 12        Hematology Last Labs        Recent Labs  Lab 03/12/19 0250 03/13/19 0335 03/14/19 0345  WBC 9.2 9.2 10.3  RBC 4.15* 4.16* 4.53  HGB 11.8* 11.7* 12.7*  HCT 37.3* 37.7* 40.7  MCV 89.9 90.6 89.8  MCH 28.4 28.1 28.0  MCHC 31.6 31.0 31.2  RDW 15.3 15.1 15.0  PLT 83* 93* 107*      BNP Last Labs   Recent Labs  Lab 03/10/19 1244 03/10/19 1707  BNP 266.5* 267.6*       DDimer  Last Labs   No results for input(s): DDIMER in the last 168 hours.  Radiology     Imaging Results (Last 48 hours)  Dg Chest 2 View  Result Date: 03/12/2019 CLINICAL DATA:  New pacemaker insertion. EXAM: CHEST - 2 VIEW COMPARISON:  Portable chest 03/10/2019 FINDINGS: Pacemaker has now been inserted. Single lead in place. Cardiomegaly. Pulmonary vascular congestion. Slight interstitial edema and small bilateral pleural effusions. No bone abnormality of significance.  IMPRESSION: Pacemaker in place. Slight pulmonary edema with small bilateral pleural effusions. Electronically Signed   By: Lorriane Shire M.D.   On: 03/12/2019 11:10     Cardiac Studies   Echo 03/12/2019 IMPRESSIONS  1. The left ventricle has normal systolic function with an ejection fraction of 60-65%. The cavity size was normal. There is mildly increased left ventricular wall thickness. Left ventricular diastolic Doppler parameters are indeterminate. No evidence  of left ventricular regional wall motion abnormalities. 2. The right ventricle has normal systolic function. The cavity was normal. There is no increase in right ventricular wall thickness. 3. Left atrial size was severely dilated. 4. Right atrial size was severely dilated. 5. No evidence of mitral valve stenosis. Mild eccentric regurgitation. 6. The aortic valve is tricuspid. Mild calcification of the aortic valve. No stenosis of the aortic valve. 7. The aorta is normal unless otherwise noted. 8. The aortic root is normal in size and structure. 9. The inferior vena cava was dilated in size with <50% respiratory variability. PA systolic pressure 60 mmHg.  PACEMAKER IMPLANT8/24/2020 CONCLUSIONS:  1. Successful implantation of a St Jude Medical Assurity MRI dual-chamber pacemaker for symptomatic bradycardia 2. No early apparent complications.  Patient Profile     85 y.o. male with a history of atrial fibrillation on chronic anticoagulation with Xarelto, hypertensionand chronic diastolic CHF presented [id number] forbradycardia,dyspnea on exertion,and bilateral lower extremity edema. Symptoms progressively worsen in past 1 month.   Assessment & Plan    Complete heart block/symptomatic bradycardia - 7-second pause.  No AV nodal blocking agent at the time.  Pacemaker implanted [place].  Permanent atrial fibrillation -We will restart his Xarelto at home  Acute on chronic diastolic heart failure -EF  65%.  Severely dilated left atrium. -Weight this morning 238 down from 254.  18 L out net. -I think we have reached our goal.  Contraction alkalosis is starting to initiate.  Creatinine was remaining normal 0.75. -He was taking Lasix 80 mg in the morning 40 mg in the evening at home.  We will resume this  Hypokalemia -Continue to replete. as we decrease his Lasix this will decrease the need for this.  Essential hypertension - Blood pressures at times are elevated.  He is off of amlodipine and losartan given aggressive diuresis. -We will go ahead and add back his losartan as an outpatient.  We will discontinue his Foley catheter.  Make sure that he can urinate, ambulate safely.  And discharge home.  We will have close follow-up with electrophysiology as well as close follow-up with Dr. Rosezella [place] team.  For questions or updates, please contact [facility] Please consult [email or web address] for contact info under        Signed, Candee Furbish, MD  03/14/2019

## 2019-03-14 NOTE — Care Management Important Message (Signed)
Important Message  Patient Details  Name: Austin Gray. MRN: [id number] Date of Birth: [birth year]   Medicare Important Message Given:  Yes     [name] 03/14/2019, 2:29 PM

## 2019-03-14 NOTE — Progress Notes (Addendum)
Progress Note  Patient Name: Austin Gray. Date of Encounter: 03/14/2019  Primary Cardiologist: Minus Breeding, MD   Subjective   States that he is ready to go.  We will discontinue his Foley.  Breathing much improved.  Inpatient Medications    Scheduled Meds: . feeding supplement (ENSURE ENLIVE)  237 mL Oral BID BM  . furosemide  80 mg Intravenous Q8H  . multivitamin with minerals  1 tablet Oral Daily  . nicotine  14 mg Transdermal Daily  . potassium chloride  20 mEq Oral BID  . sodium chloride flush  3 mL Intravenous Q12H   Continuous Infusions: . sodium chloride 10 mL/hr at 03/11/19 1529  . sodium chloride 10 mL/hr at 03/11/19 1202   PRN Meds: sodium chloride, sodium chloride, acetaminophen, Ipratropium-Albuterol, ondansetron (ZOFRAN) IV, sodium chloride flush   Vital Signs    Vitals:   03/13/19 1442 03/13/19 2012 03/14/19 0409 03/14/19 0905  BP: 131/78 (!) 150/70 (!) 151/65   Pulse: 76 70 70   Resp: 17 (!) 22 (!) 21 20  Temp: 97.7 F (36.5 C) 98.2 F (36.8 C) 98.1 F (36.7 C)   TempSrc: Oral Oral Oral   SpO2: 96% 96% (!) 88% 91%  Weight:   108.1 kg   Height:        Intake/Output Summary (Last 24 hours) at 03/14/2019 1000 Last data filed at 03/14/2019 0630 Gross per 24 hour  Intake 203 ml  Output 3900 ml  Net -3697 ml   Last 3 Weights 03/14/2019 03/13/2019 03/12/2019  Weight (lbs) 238 lb 4.8 oz 248 lb 7.3 oz 242 lb 8.1 oz  Weight (kg) 108.092 kg 112.7 kg 110 kg      Telemetry    atrial fibrillation with paced beats noted- Personally Reviewed  ECG    No new- Personally Reviewed  Physical Exam  Elderly GEN: No acute distress.   Neck: No JVD Cardiac:  Irregularly irregular, no murmurs, rubs, or gallops.  Respiratory: Clear to auscultation bilaterally. GI: Soft, nontender, non-distended slightly protuberant MS: No edema; No deformity.  Foley catheter noted Neuro:  Nonfocal  Psych: Normal affect   Labs    High Sensitivity Troponin:   No results for input(s): TROPONINIHS in the last 720 hours.    Chemistry Recent Labs  Lab 03/12/19 0250 03/13/19 0335 03/14/19 0345  NA 144 142 144  K 3.8 3.2* 3.3*  CL 102 99 98  CO2 28 32 34*  GLUCOSE 117* 118* 111*  BUN 14 15 13   CREATININE 1.10 0.88 0.75  CALCIUM 8.4* 8.4* 8.6*  GFRNONAA >60 >60 >60  GFRAA >60 >60 >60  ANIONGAP 14 11 12      Hematology Recent Labs  Lab 03/12/19 0250 03/13/19 0335 03/14/19 0345  WBC 9.2 9.2 10.3  RBC 4.15* 4.16* 4.53  HGB 11.8* 11.7* 12.7*  HCT 37.3* 37.7* 40.7  MCV 89.9 90.6 89.8  MCH 28.4 28.1 28.0  MCHC 31.6 31.0 31.2  RDW 15.3 15.1 15.0  PLT 83* 93* 107*    BNP Recent Labs  Lab 03/10/19 1244 03/10/19 1707  BNP 266.5* 267.6*     DDimer No results for input(s): DDIMER in the last 168 hours.   Radiology    Dg Chest 2 View  Result Date: 03/12/2019 CLINICAL DATA:  New pacemaker insertion. EXAM: CHEST - 2 VIEW COMPARISON:  Portable chest 03/10/2019 FINDINGS: Pacemaker has now been inserted. Single lead in place. Cardiomegaly. Pulmonary vascular congestion. Slight interstitial edema and small bilateral pleural effusions. No  bone abnormality of significance. IMPRESSION: Pacemaker in place. Slight pulmonary edema with small bilateral pleural effusions. Electronically Signed   By: Lorriane Shire M.D.   On: 03/12/2019 11:10    Cardiac Studies   Echo 03/12/2019 IMPRESSIONS  1. The left ventricle has normal systolic function with an ejection fraction of 60-65%. The cavity size was normal. There is mildly increased left ventricular wall thickness. Left ventricular diastolic Doppler parameters are indeterminate. No evidence  of left ventricular regional wall motion abnormalities. 2. The right ventricle has normal systolic function. The cavity was normal. There is no increase in right ventricular wall thickness. 3. Left atrial size was severely dilated. 4. Right atrial size was severely dilated. 5. No evidence of mitral  valve stenosis. Mild eccentric regurgitation. 6. The aortic valve is tricuspid. Mild calcification of the aortic valve. No stenosis of the aortic valve. 7. The aorta is normal unless otherwise noted. 8. The aortic root is normal in size and structure. 9. The inferior vena cava was dilated in size with <50% respiratory variability. PA systolic pressure 60 mmHg.  PACEMAKER IMPLANT 03/11/2019 CONCLUSIONS:  1. Successful implantation of a [employer]urity MRI dual-chamber pacemaker for symptomatic bradycardia 2. No early apparent complications.   Patient Profile     85 y.o. male with a history of atrial fibrillation on chronic anticoagulation with Xarelto, hypertension and chronic diastolic CHF presented [id number] for bradycardia,dyspnea on exertion,and bilateral lower extremity edema. Symptoms progressively worsen in past 1 month.    Assessment & Plan    Complete heart block/symptomatic bradycardia - 7-second pause.  No AV nodal blocking agent at the time.  Pacemaker implanted [place].  Permanent atrial fibrillation -We will restart his Xarelto at home  Acute on chronic diastolic heart failure -EF 65%.  Severely dilated left atrium. -Weight this morning 238 down from 254.  18 L out net. -I think we have reached our goal.  Contraction alkalosis is starting to initiate.  Creatinine was remaining normal 0.75. -He was taking Lasix 80 mg in the morning 40 mg in the evening at home.  We will resume this  Hypokalemia -Continue to replete. as we decrease his Lasix this will decrease the need for this.  Essential hypertension - Blood pressures at times are elevated.  He is off of amlodipine and losartan given aggressive diuresis. -We will go ahead and add back his losartan as an outpatient.  We will discontinue his Foley catheter.  Make sure that he can urinate, ambulate safely.  And discharge home.  We will have close follow-up with electrophysiology as well as close  follow-up with Dr. Rosezella [place] team.  For questions or updates, please contact [facility] Please consult [email or web address] for contact info under        Signed, Candee Furbish, MD  03/14/2019, 10:00 AM

## 2019-03-16 DIAGNOSIS — F419 Anxiety disorder, unspecified: Secondary | ICD-10-CM | POA: Diagnosis not present

## 2019-03-16 DIAGNOSIS — Z48812 Encounter for surgical aftercare following surgery on the circulatory system: Secondary | ICD-10-CM | POA: Diagnosis not present

## 2019-03-16 DIAGNOSIS — D510 Vitamin B12 deficiency anemia due to intrinsic factor deficiency: Secondary | ICD-10-CM | POA: Diagnosis not present

## 2019-03-16 DIAGNOSIS — I110 Hypertensive heart disease with heart failure: Secondary | ICD-10-CM | POA: Diagnosis not present

## 2019-03-16 DIAGNOSIS — N400 Enlarged prostate without lower urinary tract symptoms: Secondary | ICD-10-CM | POA: Diagnosis not present

## 2019-03-16 DIAGNOSIS — M1990 Unspecified osteoarthritis, unspecified site: Secondary | ICD-10-CM | POA: Diagnosis not present

## 2019-03-16 DIAGNOSIS — I5042 Chronic combined systolic (congestive) and diastolic (congestive) heart failure: Secondary | ICD-10-CM | POA: Diagnosis not present

## 2019-03-16 DIAGNOSIS — I4821 Permanent atrial fibrillation: Secondary | ICD-10-CM | POA: Diagnosis not present

## 2019-03-16 DIAGNOSIS — E559 Vitamin D deficiency, unspecified: Secondary | ICD-10-CM | POA: Diagnosis not present

## 2019-03-20 DIAGNOSIS — I4821 Permanent atrial fibrillation: Secondary | ICD-10-CM | POA: Diagnosis not present

## 2019-03-20 DIAGNOSIS — I5042 Chronic combined systolic (congestive) and diastolic (congestive) heart failure: Secondary | ICD-10-CM | POA: Diagnosis not present

## 2019-03-20 DIAGNOSIS — N400 Enlarged prostate without lower urinary tract symptoms: Secondary | ICD-10-CM | POA: Diagnosis not present

## 2019-03-20 DIAGNOSIS — I110 Hypertensive heart disease with heart failure: Secondary | ICD-10-CM | POA: Diagnosis not present

## 2019-03-20 DIAGNOSIS — F419 Anxiety disorder, unspecified: Secondary | ICD-10-CM | POA: Diagnosis not present

## 2019-03-20 DIAGNOSIS — E559 Vitamin D deficiency, unspecified: Secondary | ICD-10-CM | POA: Diagnosis not present

## 2019-03-20 DIAGNOSIS — D510 Vitamin B12 deficiency anemia due to intrinsic factor deficiency: Secondary | ICD-10-CM | POA: Diagnosis not present

## 2019-03-20 DIAGNOSIS — Z48812 Encounter for surgical aftercare following surgery on the circulatory system: Secondary | ICD-10-CM | POA: Diagnosis not present

## 2019-03-20 DIAGNOSIS — M1990 Unspecified osteoarthritis, unspecified site: Secondary | ICD-10-CM | POA: Diagnosis not present

## 2019-03-21 ENCOUNTER — Telehealth: Payer: Self-pay | Admitting: Family Medicine

## 2019-03-21 DIAGNOSIS — I110 Hypertensive heart disease with heart failure: Secondary | ICD-10-CM | POA: Diagnosis not present

## 2019-03-21 DIAGNOSIS — I5042 Chronic combined systolic (congestive) and diastolic (congestive) heart failure: Secondary | ICD-10-CM | POA: Diagnosis not present

## 2019-03-21 DIAGNOSIS — F419 Anxiety disorder, unspecified: Secondary | ICD-10-CM | POA: Diagnosis not present

## 2019-03-21 DIAGNOSIS — N400 Enlarged prostate without lower urinary tract symptoms: Secondary | ICD-10-CM | POA: Diagnosis not present

## 2019-03-21 DIAGNOSIS — Z48812 Encounter for surgical aftercare following surgery on the circulatory system: Secondary | ICD-10-CM | POA: Diagnosis not present

## 2019-03-21 DIAGNOSIS — D510 Vitamin B12 deficiency anemia due to intrinsic factor deficiency: Secondary | ICD-10-CM | POA: Diagnosis not present

## 2019-03-21 DIAGNOSIS — I4821 Permanent atrial fibrillation: Secondary | ICD-10-CM | POA: Diagnosis not present

## 2019-03-21 DIAGNOSIS — E559 Vitamin D deficiency, unspecified: Secondary | ICD-10-CM | POA: Diagnosis not present

## 2019-03-21 DIAGNOSIS — M1990 Unspecified osteoarthritis, unspecified site: Secondary | ICD-10-CM | POA: Diagnosis not present

## 2019-03-21 NOTE — Telephone Encounter (Signed)
fyi patient has appointment tomorrow, [facility] patient saying that he is refusing home health to come out and give him a bath  Call back number is annette at [phone]

## 2019-03-22 ENCOUNTER — Encounter: Payer: Self-pay | Admitting: Family Medicine

## 2019-03-22 ENCOUNTER — Other Ambulatory Visit: Payer: Self-pay

## 2019-03-22 ENCOUNTER — Ambulatory Visit (INDEPENDENT_AMBULATORY_CARE_PROVIDER_SITE_OTHER): Payer: Medicare HMO | Admitting: Family Medicine

## 2019-03-22 VITALS — BP 134/66 | HR 67 | Temp 98.3°F | Resp 16 | Ht 68.0 in | Wt 242.0 lb

## 2019-03-22 DIAGNOSIS — I442 Atrioventricular block, complete: Secondary | ICD-10-CM

## 2019-03-22 DIAGNOSIS — Z09 Encounter for follow-up examination after completed treatment for conditions other than malignant neoplasm: Secondary | ICD-10-CM

## 2019-03-22 DIAGNOSIS — D519 Vitamin B12 deficiency anemia, unspecified: Secondary | ICD-10-CM

## 2019-03-22 DIAGNOSIS — I10 Essential (primary) hypertension: Secondary | ICD-10-CM | POA: Diagnosis not present

## 2019-03-22 DIAGNOSIS — I5032 Chronic diastolic (congestive) heart failure: Secondary | ICD-10-CM

## 2019-03-22 DIAGNOSIS — I480 Paroxysmal atrial fibrillation: Secondary | ICD-10-CM | POA: Diagnosis not present

## 2019-03-22 DIAGNOSIS — Z23 Encounter for immunization: Secondary | ICD-10-CM | POA: Diagnosis not present

## 2019-03-22 DIAGNOSIS — N179 Acute kidney failure, unspecified: Secondary | ICD-10-CM | POA: Diagnosis not present

## 2019-03-22 NOTE — Progress Notes (Signed)
Subjective:    Patient ID: Austin Mesi., male    DOB: 10-05-1931, 85 y.o.   MRN: [id number]  HPI  Patient's granddaughter called me on call recently stating that the patient's heart rate was in the 30s and that he was having trouble breathing.  I recommended they go to the emergency room immediately.  Patient was admitted to the hospital with symptomatic bradycardia due to complete heart block and received a pacemaker.  He is here today for hospital follow-up.  I have copied relevant portions of his discharge summary below for my reference: Admit date: 03/10/2019 Discharge date: 03/14/2019  Diagnostic Studies/Procedures    ECHO: 03/12/2019 1. The left ventricle has normal systolic function with an ejection fraction of 60-65%. The cavity size was normal. There is mildly increased left ventricular wall thickness. Left ventricular diastolic Doppler parameters are indeterminate. No evidence  of left ventricular regional wall motion abnormalities. 2. The right ventricle has normal systolic function. The cavity was normal. There is no increase in right ventricular wall thickness. 3. Left atrial size was severely dilated. 4. Right atrial size was severely dilated. 5. No evidence of mitral valve stenosis. Mild eccentric regurgitation. 6. The aortic valve is tricuspid. Mild calcification of the aortic valve. No stenosis of the aortic valve. 7. The aorta is normal unless otherwise noted. 8. The aortic root is normal in size and structure. 9. The inferior vena cava was dilated in size with <50% respiratory variability. PA systolic pressure 60 mmHg.  PROCEDURES: 03/11/2019 1. Pacemaker implantation.    History of Present Illness     85 yo male w/ hx HTN, permanent Afib, MR, was admitted 08/23 with increasing DOE and bradycardia.   Hospital Course     Consultants: EP   His HR was was in the 30s, complete heart block.  He also had a 17-second pause in the ER which was  symptomatic, with confusion.  He was taken to the lab and a temp wire was placed.  A temp wire rate had to be increased overnight to prevent hypotension.  He was seen by EP, and had a [place] dual lead pacemaker implanted without complication.  He is off AV nodal blocking agents.  His Xarelto was held, but he is to resume that on 8/27.  He had no bleeding issues.  Post-procedure chest x-ray showed no pneumothorax.  He was volume overloaded, and required IV Lasix 80 mg every 8 hours for diuresis.  The acute on chronic diastolic CHF was felt secondary to the bradycardia.  He diuresed almost 18 L and his weight came down 16 pounds.    He had hypokalemia secondary to diuresis and his potassium was supplemented.   His discharge weight is 238 pounds.  He is to resume his home Lasix dosing of 80 mg a.m. and 40 mg p.m.  His blood pressure was elevated at times.  His home amlodipine, cardura and losartan had been held. The losartan was restarted at discharge. Restart amlodipine and/or Cardura at follow-up as indicated 03/22/19  Patient is here today for follow-up.  He states he still feels very weak and tired.  He has very little stamina.  His blood pressure at home has been between 104 and 141/58-83.  However he resumed his Cardura and amlodipine after discharge by accident.  He denies any shortness of breath however he has +2 pitting edema in his right leg and +2 pitting edema in his left leg up to his knee.  He appears  fluid overloaded on today's exam.  His heart rate is stable in the 70s.  Pulse oximetry is between 92 and 97% at home.  They are monitoring that with a pulse ox at home.  He denies any chest pain.  He denies any dysuria.  He denies any altered mental status or confusion.  He does report constipation Past Medical History:  Diagnosis Date  . Anxiety   . Arthritis    "little bit; left knee"  . Atrial fibrillation (HCC)    Chronic  . Basal cell carcinoma   . Cataracts, bilateral     to have left eye cataract surgery on 03/21/17   . CHF (congestive heart failure), NYHA class II, chronic, diastolic ([place])   . Colon polyps    last colonoscopy 2015 (tubular adenoma)  . Depression   . Diverticulosis   . Gastritis   . GERD (gastroesophageal reflux disease)   . H/O: GI bleed    Recurrent  . Hemorrhoids   . History of blood transfusion   . History of BPH   . History of kidney stones    hx of x 1   . Hypertension   . Pernicious anemia   . PVC (premature ventricular contraction)   . Rectal bleeding 02/15/12  . Vitamin D deficiency    Past Surgical History:  Procedure Laterality Date  . COLONOSCOPY WITH PROPOFOL N/A 03/17/2017   Procedure: COLONOSCOPY WITH PROPOFOL;  Surgeon: Carol Ada, MD;  Location: [facility];  Service: Endoscopy;  Laterality: N/A;  . [place]; ~ 2002   left; right  . PACEMAKER IMPLANT N/A 03/11/2019   Procedure: PACEMAKER IMPLANT;  Surgeon: Constance Haw, MD;  Location: [facility];  Service: Cardiovascular;  Laterality: N/A;  . TEMPORARY PACEMAKER N/A 03/10/2019   Procedure: TEMPORARY PACEMAKER;  Surgeon: Martinique, Peter M, MD;  Location: [facility];  Service: Cardiovascular;  Laterality: N/A;   Current Outpatient Medications on File Prior to Visit  Medication Sig Dispense Refill  . albuterol (VENTOLIN HFA) 108 (90 Base) MCG/ACT inhaler INHALE 2 PUFFS BY MOUTH EVERY 6 HOURS AS NEEDED FOR WHEEZING OR SHORTNESS OF BREATH 18 g 3  . feeding supplement, ENSURE ENLIVE, (ENSURE ENLIVE) LIQD Take 237 mLs by mouth 2 (two) times daily between meals. 237 mL 12  . ferrous sulfate 325 (65 FE) MG tablet Take 325 mg by mouth every other day.     . fluticasone (FLONASE) 50 MCG/ACT nasal spray PLACE 1 SPRAY INTO BOTH NOSTRILS DAILY. (Patient taking differently: Place 1 spray into both nostrils as needed for allergies or rhinitis. ) 16 g 5  . furosemide (LASIX) 40 MG tablet TAKE 2 TABLETS (80MG ) EVERY MORNING AND 1 TABLET  (40MG ) EVERY EVENING (Patient taking differently: Take 40 mg by mouth 2 (two) times daily. ) 270 tablet 3  . losartan (COZAAR) 50 MG tablet Take 1 tablet (50 mg total) by mouth daily. 90 tablet 3  . Multiple Vitamin (MULTIVITAMIN) tablet Take 1 tablet by mouth daily.    . multivitamin-lutein (OCUVITE-LUTEIN) CAPS Take 1 capsule by mouth daily.    . potassium chloride (K-DUR) 10 MEQ tablet TAKE 1 TABLET EVERY DAY (Patient taking differently: Take 10 mEq by mouth daily. ) 90 tablet 3  . XARELTO 20 MG TABS tablet TAKE 1 TABLET EVERY DAY WITH SUPPER (Patient taking differently: Take 20 mg by mouth daily with supper. ) 90 tablet 3   Current Facility-Administered Medications on File Prior to Visit  Medication Dose Route Frequency Provider Last Rate Last Dose  . cyanocobalamin ((VITAMIN B-12)) injection 1,000 mcg  1,000 mcg Intramuscular Q30 days [name], MD   1,000 mcg at 03/22/19 1139   No Known Allergies Social History   Socioeconomic History  . Marital status: Widowed    Spouse name: Not on file  . Number of children: 1  . Years of education: Not on file  . Highest education level: Not on file  Occupational History  . Occupation: Retired  Scientific laboratory technician  . Financial resource strain: Not on file  . Food insecurity    Worry: Not on file    Inability: Not on file  . Transportation needs    Medical: Not on file    Non-medical: Not on file  Tobacco Use  . Smoking status: Never Smoker  . Smokeless tobacco: Former Systems developer    Types: Snuff  . Tobacco comment: 02/15/12 "still use snuff @ times; not daily"  Substance and Sexual Activity  . Alcohol use: No  . Drug use: No  . Sexual activity: Never    Comment: widower, lives with granddaughter and great grandchildren.  Lifestyle  . Physical activity    Days per week: Not on file    Minutes per session: Not on file  . Stress: Not on file  Relationships  . Social Herbalist on phone: Not on file    Gets together: Not on  file    Attends religious service: Not on file    Active member of club or organization: Not on file    Attends meetings of clubs or organizations: Not on file    Relationship status: Not on file  . Intimate partner violence    Fear of current or ex partner: Not on file    Emotionally abused: Not on file    Physically abused: Not on file    Forced sexual activity: Not on file  Other Topics Concern  . Not on file  Social History Narrative   Lives with grand daughter.         Epworth Sleepiness Scale = 7 (as of 09/21/2015)     Review of Systems  All other systems reviewed and are negative.      Objective:   Physical Exam Vitals signs reviewed.  Constitutional:      General: He is not in acute distress.    Appearance: Normal appearance. He is normal weight. He is not ill-appearing, toxic-appearing or diaphoretic.  Cardiovascular:     Rate and Rhythm: Normal rate and regular rhythm.     Heart sounds: Normal heart sounds.  Pulmonary:     Effort: Pulmonary effort is normal.     Breath sounds: Normal breath sounds. No wheezing, rhonchi or rales.  Abdominal:     General: Abdomen is flat.     Palpations: Abdomen is soft.  Musculoskeletal:     Right lower leg: Edema present.     Left lower leg: Edema present.  Skin:    Coloration: Skin is not jaundiced.  Neurological:     Mental Status: He is alert.           Assessment & Plan:  Hospital discharge follow-up - Plan: CBC with Differential/Platelet, BASIC METABOLIC PANEL WITH GFR  Need for immunization against influenza - Plan: Flu Vaccine QUAD High Dose(Fluad)  Paroxysmal atrial fibrillation (HCC)  Chronic diastolic heart failure (HCC)  Heart block AV complete ([place])  AKI (acute kidney injury) ([place])  Spent  25 minutes today with the patient discussing his medical condition.  Recommended that he discontinue amlodipine and Cardura.  Monitor blood pressure closely.  However I believe he needs increased diuresis based  on his clinical exam.  Therefore I will increase his Lasix to 80 mg twice daily and recheck the patient next week.  Obtain a baseline BMP today and monitor his hemoglobin given his recent surgery.  Monitor his potassium and renal function next week at follow-up and also watch his peripheral edema closely.  Patient received his flu shot today.

## 2019-03-23 LAB — CBC WITH DIFFERENTIAL/PLATELET
Absolute Monocytes: 628 cells/uL (ref 200–950)
Basophils Absolute: 48 cells/uL (ref 0–200)
Basophils Relative: 0.7 %
Eosinophils Absolute: 97 cells/uL (ref 15–500)
Eosinophils Relative: 1.4 %
HCT: 36.3 % — ABNORMAL LOW (ref 38.5–50.0)
Hemoglobin: 11.7 g/dL — ABNORMAL LOW (ref 13.2–17.1)
Lymphs Abs: 1014 cells/uL (ref 850–3900)
MCH: 28 pg (ref 27.0–33.0)
MCHC: 32.2 g/dL (ref 32.0–36.0)
MCV: 86.8 fL (ref 80.0–100.0)
MPV: 11.7 fL (ref 7.5–12.5)
Monocytes Relative: 9.1 %
Neutro Abs: 5113 cells/uL (ref 1500–7800)
Neutrophils Relative %: 74.1 %
Platelets: 139 10*3/uL — ABNORMAL LOW (ref 140–400)
RBC: 4.18 10*6/uL — ABNORMAL LOW (ref 4.20–5.80)
RDW: 13.6 % (ref 11.0–15.0)
Total Lymphocyte: 14.7 %
WBC: 6.9 10*3/uL (ref 3.8–10.8)

## 2019-03-23 LAB — BASIC METABOLIC PANEL WITH GFR
BUN: 11 mg/dL (ref 7–25)
CO2: 28 mmol/L (ref 20–32)
Calcium: 9.4 mg/dL (ref 8.6–10.3)
Chloride: 103 mmol/L (ref 98–110)
Creat: 0.85 mg/dL (ref 0.70–1.11)
GFR, Est African American: 92 mL/min/{1.73_m2} (ref >=60)
GFR, Est Non African American: 79 mL/min/{1.73_m2} (ref >=60)
Glucose, Bld: 139 mg/dL — ABNORMAL HIGH (ref 65–99)
Potassium: 3.8 mmol/L (ref 3.5–5.3)
Sodium: 139 mmol/L (ref 135–146)

## 2019-03-25 NOTE — Progress Notes (Signed)
Cardiology Office Note Date:  03/27/2019  Austin Austin., DOB Dec 19, 1931, MRN [id number] PCP:  Austin Frizzle, MD  Cardiologist:  Dr. Percival Spanish Electrophysiologist: Dr. Curt Bears     Chief Complaint:  wound check, f/u  History of Present Illness: Austin Sega. is a 85 y.o. male with history of HTN, VHD w/ mild-mod MR, permanent Afib.  He was admitted to [facility] 03/10/2019 with symptomatic bradycardia, CHB, pause as long as 17 seconds > temp wire without reversible causes underwent PPM implant 03/11/2019.  He comes in today for wound check, post implant visit.  He comes today accompanied by a liong time friend who helps him out.  He is doing well, no CP, palpitations, no dizziness, near syncope or syncope.  He denies rest SOB but has some DOE that sounds unchanged for about a year. He has edema he mentions comes/goes, worse of late though much better since his hospital stay. He saw his PMD Last week and he increased his lasix to 80mg  BID with more improvement, saw him again yesterday with stable labs and no changes to his lasix and sees him again next week. He is also scheduled to see H[facility] Eulas Post, [place] for Dr. Percival Spanish later this week.  He is taking his xarelto without bleeding or signs of bleeding.  Device information: SJM single chamber PPM implanted 03/11/2019, CHB, symptomatic bradycardia   Past Medical History:  Diagnosis Date  . Anxiety   . Arthritis    "little bit; left knee"  . Atrial fibrillation (HCC)    Chronic  . Basal cell carcinoma   . Cataracts, bilateral    to have left eye cataract surgery on 03/21/17   . CHF (congestive heart failure), NYHA class II, chronic, diastolic ([place])   . Colon polyps    last colonoscopy 2015 (tubular adenoma)  . Depression   . Diverticulosis   . Gastritis   . GERD (gastroesophageal reflux disease)   . H/O: GI bleed    Recurrent  . Hemorrhoids   . History of blood transfusion   . History of BPH   . History of  kidney stones    hx of x 1   . Hypertension   . Pernicious anemia   . PVC (premature ventricular contraction)   . Rectal bleeding 02/15/12  . Vitamin D deficiency     Past Surgical History:  Procedure Laterality Date  . COLONOSCOPY WITH PROPOFOL N/A 03/17/2017   Procedure: COLONOSCOPY WITH PROPOFOL;  Surgeon: Carol Ada, MD;  Location: [facility];  Service: Endoscopy;  Laterality: N/A;  . [place]; ~ 2002   left; right  . PACEMAKER IMPLANT N/A 03/11/2019   Procedure: PACEMAKER IMPLANT;  Surgeon: Constance Haw, MD;  Location: [place] [facility];  Service: Cardiovascular;  Laterality: N/A;  . TEMPORARY PACEMAKER N/A 03/10/2019   Procedure: TEMPORARY PACEMAKER;  Surgeon: Martinique, Peter M, MD;  Location: [place] [facility];  Service: Cardiovascular;  Laterality: N/A;    Current Outpatient Medications  Medication Sig Dispense Refill  . albuterol (VENTOLIN HFA) 108 (90 Base) MCG/ACT inhaler INHALE 2 PUFFS BY MOUTH EVERY 6 HOURS AS NEEDED FOR WHEEZING OR SHORTNESS OF BREATH 18 g 3  . feeding supplement, ENSURE ENLIVE, (ENSURE ENLIVE) LIQD Take 237 mLs by mouth 2 (two) times daily between meals. 237 mL 12  . ferrous sulfate 325 (65 FE) MG tablet Take 325 mg by mouth every other day.     . fluticasone (  FLONASE) 50 MCG/ACT nasal spray PLACE 1 SPRAY INTO BOTH NOSTRILS DAILY. (Austin taking differently: Place 1 spray into both nostrils as needed for allergies or rhinitis. ) 16 g 5  . furosemide (LASIX) 40 MG tablet TAKE 2 TABLETS (80MG ) EVERY MORNING AND 1 TABLET (40MG ) EVERY EVENING (Austin taking differently: Take 40 mg by mouth 2 (two) times daily. ) 270 tablet 3  . losartan (COZAAR) 50 MG tablet Take 1 tablet (50 mg total) by mouth daily. 90 tablet 3  . Multiple Vitamin (MULTIVITAMIN) tablet Take 1 tablet by mouth daily.    . multivitamin-lutein (OCUVITE-LUTEIN) CAPS Take 1 capsule by mouth daily.    . potassium chloride (K-DUR) 10 MEQ tablet TAKE 1 TABLET EVERY  DAY (Austin taking differently: Take 10 mEq by mouth daily. ) 90 tablet 3  . XARELTO 20 MG TABS tablet TAKE 1 TABLET EVERY DAY WITH SUPPER (Austin taking differently: Take 20 mg by mouth daily with supper. ) 90 tablet 3   Current Facility-Administered Medications  Medication Dose Route Frequency Provider Last Rate Last Dose  . cyanocobalamin ((VITAMIN B-12)) injection 1,000 mcg  1,000 mcg Intramuscular Q30 days Austin Frizzle, MD   1,000 mcg at 03/22/19 1139    Allergies:   Austin has no known allergies.   Social History:  Austin Austin  reports that he has never smoked. He quit smokeless tobacco use about 13 months ago.  His smokeless tobacco use included snuff. He reports that he does not drink alcohol or use drugs.   Family History:  Austin Austin family history includes Cancer in his mother; Stroke (age of onset: 68) in his father.  ROS:  Please see Austin history of present illness.    All other systems are reviewed and otherwise negative.   PHYSICAL EXAM:  VS:  BP (!) 162/83   Pulse 71   Ht 5\' 8"  (1.727 m)   Wt 236 lb (107 kg)   BMI 35.88 kg/m  BMI: Body mass index is 35.88 kg/m. Well nourished, well developed, in no acute distress  HEENT: normocephalic, atraumatic  Neck: no JVD, carotid bruits or masses Cardiac:  RRR (paced); no significant murmurs, no rubs, or gallops Lungs:   CTA b/l, no wheezing, rhonchi or rales  Abd: soft, nontender, obese MS: no deformity or atrophy Ext: 1++  Edema to midshin b/l (has support stocking son  Skin: warm and dry, no rash Neuro:  No gross deficits appreciated Psych: euthymic mood, full affect  PPM site: he comes today still with his tegaderm on.  This was removed as well ash his steri strips without difficulty.  Austin wound is well healed, no erythema, edema, fluctuation, no increased heat to Austin surrounding tissues.   EKG:  Not done today PPM interrogation done today and reviewed by meself; battery and lead measurements are stable He  is V paced at 35bpm today, on a couple attempts a single VS would come in measured 5.49mV No change in sensitivity was made given he appears predominantly and likely pace dependent. AUto capture programmed on without any outlier measurements and remains on   03/12/2019: TTE IMPRESSIONS  1. Austin left ventricle has normal systolic function with an ejection fraction of 60-65%. Austin cavity size was normal. There is mildly increased left ventricular wall thickness. Left ventricular diastolic Doppler parameters are indeterminate. No evidence  of left ventricular regional wall motion abnormalities.  2. Austin right ventricle has normal systolic function. Austin cavity was normal. There is no increase in right ventricular  wall thickness.  3. Left atrial size was severely dilated.  4. Right atrial size was severely dilated.  5. No evidence of mitral valve stenosis. Mild eccentric regurgitation.  6. Austin aortic valve is tricuspid. Mild calcification of Austin aortic valve. No stenosis of Austin aortic valve.  7. Austin aorta is normal unless otherwise noted.  8. Austin aortic root is normal in size and structure.  9. Austin inferior vena cava was dilated in size with <50% respiratory variability. PA systolic pressure 60 mmHg.   Recent Labs: 01/28/2019: ALT 17 03/10/2019: B Natriuretic Peptide 267.6; Magnesium 2.5 03/22/2019: Hemoglobin 11.7; Platelets 139 03/26/2019: BUN 11; Creat 0.89; Potassium 3.6; Sodium 140  No results found for requested labs within last 8760 hours.   Estimated Creatinine Clearance: 71.9 mL/min (by C-G formula based on SCr of 0.89 mg/dL).   Wt Readings from Last 3 Encounters:  03/27/19 236 lb (107 kg)  03/26/19 235 lb (106.6 kg)  03/22/19 242 lb (109.8 kg)     Other studies reviewed: Additional studies/records reviewed today include: summarized above  ASSESSMENT AND PLAN:  1. PPM     Site is stable     Activity restrictions for Austin next 4 weeks are re-reviewed     Planned for Q 3 month  remotes, Dr. Curt Bears in November  2. HTN     High here today though he has home BP that are better, generally 120-140/70-80 range  3. Chronic CHF (diastolic)     Edema, volume OL     Pt reports improving, his PMD is managing and following closely     He sees general cardiology later this week     Will defer to them, no changes today  4. Permanent AFib     CHA2DS2Vasc is 3, on Xarelto, appropriately dosed       Disposition: F/u with Janan Ridge, PA this week as scheduled, otherwise Dr. Curt Bears in Nov and Q 60mo remotes    Current medicines are reviewed at length with Austin Austin today.  Austin Austin did not have any concerns regarding medicines.  Venetia Night, PA-C 03/27/2019 1:20 PM     [address] [place] [id number] [phone] (office)  [phone] (fax)

## 2019-03-26 ENCOUNTER — Ambulatory Visit (INDEPENDENT_AMBULATORY_CARE_PROVIDER_SITE_OTHER): Payer: Medicare HMO | Admitting: Family Medicine

## 2019-03-26 ENCOUNTER — Encounter: Payer: Self-pay | Admitting: Family Medicine

## 2019-03-26 ENCOUNTER — Other Ambulatory Visit: Payer: Medicare HMO

## 2019-03-26 ENCOUNTER — Other Ambulatory Visit: Payer: Self-pay

## 2019-03-26 VITALS — BP 130/82 | HR 71 | Temp 98.0°F | Resp 17 | Ht 68.0 in | Wt 235.0 lb

## 2019-03-26 DIAGNOSIS — I110 Hypertensive heart disease with heart failure: Secondary | ICD-10-CM | POA: Diagnosis not present

## 2019-03-26 DIAGNOSIS — I10 Essential (primary) hypertension: Secondary | ICD-10-CM

## 2019-03-26 DIAGNOSIS — I5032 Chronic diastolic (congestive) heart failure: Secondary | ICD-10-CM | POA: Diagnosis not present

## 2019-03-26 DIAGNOSIS — N400 Enlarged prostate without lower urinary tract symptoms: Secondary | ICD-10-CM | POA: Diagnosis not present

## 2019-03-26 DIAGNOSIS — E559 Vitamin D deficiency, unspecified: Secondary | ICD-10-CM | POA: Diagnosis not present

## 2019-03-26 DIAGNOSIS — D510 Vitamin B12 deficiency anemia due to intrinsic factor deficiency: Secondary | ICD-10-CM | POA: Diagnosis not present

## 2019-03-26 DIAGNOSIS — I4821 Permanent atrial fibrillation: Secondary | ICD-10-CM | POA: Diagnosis not present

## 2019-03-26 DIAGNOSIS — Z48812 Encounter for surgical aftercare following surgery on the circulatory system: Secondary | ICD-10-CM | POA: Diagnosis not present

## 2019-03-26 DIAGNOSIS — F419 Anxiety disorder, unspecified: Secondary | ICD-10-CM | POA: Diagnosis not present

## 2019-03-26 DIAGNOSIS — I5042 Chronic combined systolic (congestive) and diastolic (congestive) heart failure: Secondary | ICD-10-CM | POA: Diagnosis not present

## 2019-03-26 DIAGNOSIS — M1990 Unspecified osteoarthritis, unspecified site: Secondary | ICD-10-CM | POA: Diagnosis not present

## 2019-03-26 NOTE — Progress Notes (Signed)
Subjective:    Patient ID: Austin Mesi., male    DOB: 10-05-1931, 85 y.o.   MRN: [id number]  HPI  Patient's granddaughter called me on call recently stating that the patient's heart rate was in the 30s and that he was having trouble breathing.  I recommended they go to the emergency room immediately.  Patient was admitted to the hospital with symptomatic bradycardia due to complete heart block and received a pacemaker.  He is here today for hospital follow-up.  I have copied relevant portions of his discharge summary below for my reference: Admit date: 03/10/2019 Discharge date: 03/14/2019  Diagnostic Studies/Procedures    ECHO: 03/12/2019 1. The left ventricle has normal systolic function with an ejection fraction of 60-65%. The cavity size was normal. There is mildly increased left ventricular wall thickness. Left ventricular diastolic Doppler parameters are indeterminate. No evidence  of left ventricular regional wall motion abnormalities. 2. The right ventricle has normal systolic function. The cavity was normal. There is no increase in right ventricular wall thickness. 3. Left atrial size was severely dilated. 4. Right atrial size was severely dilated. 5. No evidence of mitral valve stenosis. Mild eccentric regurgitation. 6. The aortic valve is tricuspid. Mild calcification of the aortic valve. No stenosis of the aortic valve. 7. The aorta is normal unless otherwise noted. 8. The aortic root is normal in size and structure. 9. The inferior vena cava was dilated in size with <50% respiratory variability. PA systolic pressure 60 mmHg.  PROCEDURES: 03/11/2019 1. Pacemaker implantation.    History of Present Illness     85 yo male w/ hx HTN, permanent Afib, MR, was admitted 08/23 with increasing DOE and bradycardia.   Hospital Course     Consultants: EP   His HR was was in the 30s, complete heart block.  He also had a 17-second pause in the ER which was  symptomatic, with confusion.  He was taken to the lab and a temp wire was placed.  A temp wire rate had to be increased overnight to prevent hypotension.  He was seen by EP, and had a [place] dual lead pacemaker implanted without complication.  He is off AV nodal blocking agents.  His Xarelto was held, but he is to resume that on 8/27.  He had no bleeding issues.  Post-procedure chest x-ray showed no pneumothorax.  He was volume overloaded, and required IV Lasix 80 mg every 8 hours for diuresis.  The acute on chronic diastolic CHF was felt secondary to the bradycardia.  He diuresed almost 18 L and his weight came down 16 pounds.    He had hypokalemia secondary to diuresis and his potassium was supplemented.   His discharge weight is 238 pounds.  He is to resume his home Lasix dosing of 80 mg a.m. and 40 mg p.m.  His blood pressure was elevated at times.  His home amlodipine, cardura and losartan had been held. The losartan was restarted at discharge. Restart amlodipine and/or Cardura at follow-up as indicated 03/22/19  Patient is here today for follow-up.  He states he still feels very weak and tired.  He has very little stamina.  His blood pressure at home has been between 104 and 141/58-83.  However he resumed his Cardura and amlodipine after discharge by accident.  He denies any shortness of breath however he has +2 pitting edema in his right leg and +2 pitting edema in his left leg up to his knee.  He appears  fluid overloaded on today's exam.  His heart rate is stable in the 70s.  Pulse oximetry is between 92 and 97% at home.  They are monitoring that with a pulse ox at home.  He denies any chest pain.  He denies any dysuria.  He denies any altered mental status or confusion.  He does report constipation.  At that time, my plan was: Spent 25 minutes today with the patient discussing his medical condition.  Recommended that he discontinue amlodipine and Cardura.  Monitor blood pressure  closely.  However I believe he needs increased diuresis based on his clinical exam.  Therefore I will increase his Lasix to 80 mg twice daily and recheck the patient next week.  Obtain a baseline BMP today and monitor his hemoglobin given his recent surgery.  Monitor his potassium and renal function next week at follow-up and also watch his peripheral edema closely.  Patient received his flu shot today.  03/26/19 Office Visit on 03/22/2019  Component Date Value Ref Range Status  . WBC 03/22/2019 6.9  3.8 - 10.8 Thousand/uL Final  . RBC 03/22/2019 4.18* 4.20 - 5.80 Million/uL Final  . Hemoglobin 03/22/2019 11.7* 13.2 - 17.1 g/dL Final  . HCT 03/22/2019 36.3* 38.5 - 50.0 % Final  . MCV 03/22/2019 86.8  80.0 - 100.0 fL Final  . MCH 03/22/2019 28.0  27.0 - 33.0 pg Final  . MCHC 03/22/2019 32.2  32.0 - 36.0 g/dL Final  . RDW 03/22/2019 13.6  11.0 - 15.0 % Final  . Platelets 03/22/2019 139* 140 - 400 Thousand/uL Final  . MPV 03/22/2019 11.7  7.5 - 12.5 fL Final  . Neutro Abs 03/22/2019 5,113  1,500 - 7,800 cells/uL Final  . Lymphs Abs 03/22/2019 1,014  850 - 3,900 cells/uL Final  . Absolute Monocytes 03/22/2019 628  200 - 950 cells/uL Final  . Eosinophils Absolute 03/22/2019 97  15 - 500 cells/uL Final  . Basophils Absolute 03/22/2019 48  0 - 200 cells/uL Final  . Neutrophils Relative % 03/22/2019 74.1  % Final  . Total Lymphocyte 03/22/2019 14.7  % Final  . Monocytes Relative 03/22/2019 9.1  % Final  . Eosinophils Relative 03/22/2019 1.4  % Final  . Basophils Relative 03/22/2019 0.7  % Final  . Glucose, Bld 03/22/2019 139* 65 - 99 mg/dL Final   Comment: .            Fasting reference interval . For someone without known diabetes, a glucose value >125 mg/dL indicates that they may have diabetes and this should be confirmed with a follow-up test. .   . BUN 03/22/2019 11  7 - 25 mg/dL Final  . Creat 03/22/2019 0.85  0.70 - 1.11 mg/dL Final   Comment: For patients >23 years of age, the  reference limit for Creatinine is approximately 13% higher for people identified as African-American. .   . GFR, Est Non African American 03/22/2019 79  > OR = 60 mL/min/1.64m2 Final  . GFR, Est African American 03/22/2019 92  > OR = 60 mL/min/1.16m2 Final  . BUN/Creatinine Ratio [id number] NOT APPLICABLE  6 - 22 (calc) Final  . Sodium 03/22/2019 139  135 - 146 mmol/L Final  . Potassium 03/22/2019 3.8  3.5 - 5.3 mmol/L Final  . Chloride 03/22/2019 103  98 - 110 mmol/L Final  . CO2 03/22/2019 28  20 - 32 mmol/L Final  . Calcium 03/22/2019 9.4  8.6 - 10.3 mg/dL Final   Patient has lost 7 pounds since his  appointment last week.  Last week he was 242 pounds.  Today he is 235!Marland Kitchen  He looks much better.  Although he discontinued his blood pressure medicines at his last appointment, his blood pressure remains well controlled today.  He is still taking losartan.  He denies any chest pain.  He denies any shortness of breath.  He denies any orthopnea.  Today he is in normal sinus rhythm.  His lungs are clear to auscultation with no evidence of pulmonary edema.  He still has trace bipedal edema but much better than last week Past Medical History:  Diagnosis Date  . Anxiety   . Arthritis    "little bit; left knee"  . Atrial fibrillation (HCC)    Chronic  . Basal cell carcinoma   . Cataracts, bilateral    to have left eye cataract surgery on 03/21/17   . CHF (congestive heart failure), NYHA class II, chronic, diastolic ([place])   . Colon polyps    last colonoscopy 2015 (tubular adenoma)  . Depression   . Diverticulosis   . Gastritis   . GERD (gastroesophageal reflux disease)   . H/O: GI bleed    Recurrent  . Hemorrhoids   . History of blood transfusion   . History of BPH   . History of kidney stones    hx of x 1   . Hypertension   . Pernicious anemia   . PVC (premature ventricular contraction)   . Rectal bleeding 02/15/12  . Vitamin D deficiency    Past Surgical History:  Procedure  Laterality Date  . COLONOSCOPY WITH PROPOFOL N/A 03/17/2017   Procedure: COLONOSCOPY WITH PROPOFOL;  Surgeon: Carol Ada, MD;  [facility];  Service: Endoscopy;  Laterality: N/A;  . [place]; ~ 2002   left; right  . PACEMAKER IMPLANT N/A 03/11/2019   Procedure: PACEMAKER IMPLANT;  Surgeon: Constance Haw, MD;  Location: [place] [facility];  Service: Cardiovascular;  Laterality: N/A;  . TEMPORARY PACEMAKER N/A 03/10/2019   Procedure: TEMPORARY PACEMAKER;  Surgeon: Martinique, Peter M, MD;  Location: [place] [facility];  Service: Cardiovascular;  Laterality: N/A;   Current Outpatient Medications on File Prior to Visit  Medication Sig Dispense Refill  . albuterol (VENTOLIN HFA) 108 (90 Base) MCG/ACT inhaler INHALE 2 PUFFS BY MOUTH EVERY 6 HOURS AS NEEDED FOR WHEEZING OR SHORTNESS OF BREATH 18 g 3  . feeding supplement, ENSURE ENLIVE, (ENSURE ENLIVE) LIQD Take 237 mLs by mouth 2 (two) times daily between meals. 237 mL 12  . ferrous sulfate 325 (65 FE) MG tablet Take 325 mg by mouth every other day.     . fluticasone (FLONASE) 50 MCG/ACT nasal spray PLACE 1 SPRAY INTO BOTH NOSTRILS DAILY. (Patient taking differently: Place 1 spray into both nostrils as needed for allergies or rhinitis. ) 16 g 5  . furosemide (LASIX) 40 MG tablet TAKE 2 TABLETS (80MG ) EVERY MORNING AND 1 TABLET (40MG ) EVERY EVENING (Patient taking differently: Take 40 mg by mouth 2 (two) times daily. ) 270 tablet 3  . losartan (COZAAR) 50 MG tablet Take 1 tablet (50 mg total) by mouth daily. 90 tablet 3  . Multiple Vitamin (MULTIVITAMIN) tablet Take 1 tablet by mouth daily.    . multivitamin-lutein (OCUVITE-LUTEIN) CAPS Take 1 capsule by mouth daily.    . potassium chloride (K-DUR) 10 MEQ tablet TAKE 1 TABLET EVERY DAY (Patient taking differently: Take 10 mEq by mouth daily. ) 90 tablet 3  . XARELTO 20  MG TABS tablet TAKE 1 TABLET EVERY DAY WITH SUPPER (Patient taking differently: Take 20 mg by mouth  daily with supper. ) 90 tablet 3   Current Facility-Administered Medications on File Prior to Visit  Medication Dose Route Frequency Provider Last Rate Last Dose  . cyanocobalamin ((VITAMIN B-12)) injection 1,000 mcg  1,000 mcg Intramuscular Q30 days [name], MD   1,000 mcg at 03/22/19 1139   No Known Allergies Social History   Socioeconomic History  . Marital status: Widowed    Spouse name: Not on file  . Number of children: 1  . Years of education: Not on file  . Highest education level: Not on file  Occupational History  . Occupation: Retired  Scientific laboratory technician  . Financial resource strain: Not on file  . Food insecurity    Worry: Not on file    Inability: Not on file  . Transportation needs    Medical: Not on file    Non-medical: Not on file  Tobacco Use  . Smoking status: Never Smoker  . Smokeless tobacco: Former Systems developer    Types: Snuff  . Tobacco comment: 02/15/12 "still use snuff @ times; not daily"  Substance and Sexual Activity  . Alcohol use: No  . Drug use: No  . Sexual activity: Never    Comment: widower, lives with granddaughter and great grandchildren.  Lifestyle  . Physical activity    Days per week: Not on file    Minutes per session: Not on file  . Stress: Not on file  Relationships  . Social Herbalist on phone: Not on file    Gets together: Not on file    Attends religious service: Not on file    Active member of club or organization: Not on file    Attends meetings of clubs or organizations: Not on file    Relationship status: Not on file  . Intimate partner violence    Fear of current or ex partner: Not on file    Emotionally abused: Not on file    Physically abused: Not on file    Forced sexual activity: Not on file  Other Topics Concern  . Not on file  Social History Narrative   Lives with grand daughter.         Epworth Sleepiness Scale = 7 (as of 09/21/2015)     Review of Systems  All other systems reviewed and are  negative.      Objective:   Physical Exam Vitals signs reviewed.  Constitutional:      General: He is not in acute distress.    Appearance: Normal appearance. He is normal weight. He is not ill-appearing, toxic-appearing or diaphoretic.  Cardiovascular:     Rate and Rhythm: Normal rate and regular rhythm.     Heart sounds: Normal heart sounds.  Pulmonary:     Effort: Pulmonary effort is normal.     Breath sounds: Normal breath sounds. No wheezing, rhonchi or rales.  Abdominal:     General: Abdomen is flat.     Palpations: Abdomen is soft.  Musculoskeletal:     Right lower leg: No edema.     Left lower leg: No edema.  Skin:    Coloration: Skin is not jaundiced.  Neurological:     Mental Status: He is alert.           Assessment & Plan:  Benign essential HTN - Plan: BASIC METABOLIC PANEL WITH GFR  Chronic diastolic heart  failure [facility])  Patient appears euvolemic today and seems to be doing better on the higher dose of Lasix.  His blood pressure remains well controlled despite stopping his Cardura and amlodipine.  He is still taking losartan.  I will recheck renal function today.  As long as his renal function is stable I will continue the patient on the higher dose of Lasix 80 mg twice daily and then reassess the patient in 1 week.  If he maintains stability in his weight, blood pressure, and renal function, we will maintain this patient long-term on Lasix 80 mg twice daily.  However if he does show evidence of prerenal azotemia today on his lab work, we will need to reduce his Lasix dose.

## 2019-03-27 ENCOUNTER — Ambulatory Visit (INDEPENDENT_AMBULATORY_CARE_PROVIDER_SITE_OTHER): Payer: Medicare HMO | Admitting: Physician Assistant

## 2019-03-27 ENCOUNTER — Other Ambulatory Visit: Payer: Self-pay

## 2019-03-27 VITALS — BP 162/83 | HR 71 | Ht 68.0 in | Wt 236.0 lb

## 2019-03-27 DIAGNOSIS — I10 Essential (primary) hypertension: Secondary | ICD-10-CM | POA: Diagnosis not present

## 2019-03-27 DIAGNOSIS — I4821 Permanent atrial fibrillation: Secondary | ICD-10-CM

## 2019-03-27 DIAGNOSIS — Z950 Presence of cardiac pacemaker: Secondary | ICD-10-CM

## 2019-03-27 DIAGNOSIS — I5032 Chronic diastolic (congestive) heart failure: Secondary | ICD-10-CM | POA: Diagnosis not present

## 2019-03-27 DIAGNOSIS — I4819 Other persistent atrial fibrillation: Secondary | ICD-10-CM | POA: Diagnosis not present

## 2019-03-27 DIAGNOSIS — Z5189 Encounter for other specified aftercare: Secondary | ICD-10-CM | POA: Diagnosis not present

## 2019-03-27 LAB — BASIC METABOLIC PANEL WITH GFR
BUN: 11 mg/dL (ref 7–25)
CO2: 31 mmol/L (ref 20–32)
Calcium: 9.7 mg/dL (ref 8.6–10.3)
Chloride: 100 mmol/L (ref 98–110)
Creat: 0.89 mg/dL (ref 0.70–1.11)
GFR, Est African American: 90 mL/min/{1.73_m2} (ref >=60)
GFR, Est Non African American: 78 mL/min/{1.73_m2} (ref >=60)
Glucose, Bld: 107 mg/dL — ABNORMAL HIGH (ref 65–99)
Potassium: 3.6 mmol/L (ref 3.5–5.3)
Sodium: 140 mmol/L (ref 135–146)

## 2019-03-27 NOTE — Patient Instructions (Signed)
Medication Instructions:  Your physician recommends that you continue on your current medications as directed. Please refer to the Current Medication list given to you today.   If you need a refill on your cardiac medications before your next appointment, please call your pharmacy.   Lab work: NONE ORDERED  TODAY   If you have labs (blood work) drawn today and your tests are completely normal, you will receive your results only by: . MyChart Message (if you have MyChart) OR . A paper copy in the mail If you have any lab test that is abnormal or we need to change your treatment, we will call you to review the results.  Testing/Procedures: NONE ORDERED  TODAY  Follow-Up:   AS SCHEDULED   Any Other Special Instructions Will Be Listed Below (If Applicable).     

## 2019-03-28 ENCOUNTER — Telehealth: Payer: Self-pay | Admitting: Physician Assistant

## 2019-03-28 ENCOUNTER — Telehealth: Payer: Self-pay | Admitting: Family Medicine

## 2019-03-28 DIAGNOSIS — F419 Anxiety disorder, unspecified: Secondary | ICD-10-CM | POA: Diagnosis not present

## 2019-03-28 DIAGNOSIS — M1990 Unspecified osteoarthritis, unspecified site: Secondary | ICD-10-CM | POA: Diagnosis not present

## 2019-03-28 DIAGNOSIS — E559 Vitamin D deficiency, unspecified: Secondary | ICD-10-CM | POA: Diagnosis not present

## 2019-03-28 DIAGNOSIS — I110 Hypertensive heart disease with heart failure: Secondary | ICD-10-CM | POA: Diagnosis not present

## 2019-03-28 DIAGNOSIS — I4821 Permanent atrial fibrillation: Secondary | ICD-10-CM | POA: Diagnosis not present

## 2019-03-28 DIAGNOSIS — D510 Vitamin B12 deficiency anemia due to intrinsic factor deficiency: Secondary | ICD-10-CM | POA: Diagnosis not present

## 2019-03-28 DIAGNOSIS — N400 Enlarged prostate without lower urinary tract symptoms: Secondary | ICD-10-CM | POA: Diagnosis not present

## 2019-03-28 DIAGNOSIS — I5042 Chronic combined systolic (congestive) and diastolic (congestive) heart failure: Secondary | ICD-10-CM | POA: Diagnosis not present

## 2019-03-28 DIAGNOSIS — Z48812 Encounter for surgical aftercare following surgery on the circulatory system: Secondary | ICD-10-CM | POA: Diagnosis not present

## 2019-03-28 DIAGNOSIS — R0600 Dyspnea, unspecified: Secondary | ICD-10-CM

## 2019-03-28 NOTE — Telephone Encounter (Signed)
New Message:   Daughter called and said pt has an appointment tomorrow with Almyra Deforest. Pt will need his daughter(Bianca) to go in with him for his appt. Pt is not able to see and will need assistant with everything.

## 2019-03-28 NOTE — Telephone Encounter (Signed)
Noted. Thanks.

## 2019-03-28 NOTE — Telephone Encounter (Signed)
That is very reasonable for family to accompany him

## 2019-03-28 NOTE — Telephone Encounter (Signed)
Just an FYI for appt. Thank you!

## 2019-03-28 NOTE — Telephone Encounter (Signed)
Austin Gray  From [facility] calling to get order for a 4 wheel walker with a seat and a transfer to tub bench if possible  If any questions call her at [phone] the fax number to fax the order is [phone]

## 2019-03-29 ENCOUNTER — Encounter: Payer: Self-pay | Admitting: Physician Assistant

## 2019-03-29 ENCOUNTER — Other Ambulatory Visit: Payer: Self-pay

## 2019-03-29 ENCOUNTER — Ambulatory Visit (INDEPENDENT_AMBULATORY_CARE_PROVIDER_SITE_OTHER): Payer: Medicare HMO | Admitting: General Practice

## 2019-03-29 VITALS — BP 144/80 | HR 88 | Ht 68.0 in | Wt 232.2 lb

## 2019-03-29 DIAGNOSIS — I10 Essential (primary) hypertension: Secondary | ICD-10-CM

## 2019-03-29 DIAGNOSIS — I5032 Chronic diastolic (congestive) heart failure: Secondary | ICD-10-CM | POA: Diagnosis not present

## 2019-03-29 DIAGNOSIS — I4821 Permanent atrial fibrillation: Secondary | ICD-10-CM

## 2019-03-29 DIAGNOSIS — Z950 Presence of cardiac pacemaker: Secondary | ICD-10-CM

## 2019-03-29 DIAGNOSIS — N179 Acute kidney failure, unspecified: Secondary | ICD-10-CM

## 2019-03-29 DIAGNOSIS — I4819 Other persistent atrial fibrillation: Secondary | ICD-10-CM | POA: Diagnosis not present

## 2019-03-29 NOTE — Patient Instructions (Addendum)
Medication Instructions:   Take your Lasix 80 mg (2 tablets) in the mornings and 40 mg (1 tablet)  in the afternoon/evenings, may take and additional dose of Lasix if weight gain of 3 lbs overnight and 5 lbs in a weeks If you need a refill on your cardiac medications before your next appointment, please call your pharmacy.   Lab work: NONE ordered at this time of appointment   If you have labs (blood work) drawn today and your tests are completely normal, you will receive your results only by: Marland Kitchen MyChart Message (if you have MyChart) OR . A paper copy in the mail If you have any lab test that is abnormal or we need to change your treatment, we will call you to review the results.  Testing/Procedures: NONE ordered at this time of appointment   Follow-Up: At [facility] you and your health needs are our priority.  As part of our continuing mission to provide you with exceptional heart care, we have created designated Provider Care Teams.  These Care Teams include your primary Cardiologist (physician) and Advanced Practice Providers (APPs -  Physician Assistants and Nurse Practitioners) who all work together to provide you with the care you need, when you need it. You will need a follow up appointment in 3 months with Minus Breeding, MD or one of the following Advanced Practice Providers on your designated Care Team:   Rosaria Ferries, PA-C . Jory Sims, DNP, ANP  Any Other Special Instructions Will Be Listed Below (If Applicable).  Continue to monitor your Blood Pressure    Weigh yourself in the mornings

## 2019-03-29 NOTE — Progress Notes (Signed)
Cardiology Clinic Note   Patient Name: Austin Gray. Date of Encounter: 03/29/2019  Primary Care Provider:  [name], MD Primary Cardiologist:  Minus Breeding, MD  Patient Profile    Zeb Comfort. Rhunette Croft. follows up today for his hypertension, mild to moderate MR, and A. fib.  Past Medical History    Past Medical History:  Diagnosis Date  . Anxiety   . Arthritis    "little bit; left knee"  . Atrial fibrillation (HCC)    Chronic  . Basal cell carcinoma   . Cataracts, bilateral    to have left eye cataract surgery on 03/21/17   . CHF (congestive heart failure), NYHA class II, chronic, diastolic ([place])   . Colon polyps    last colonoscopy 2015 (tubular adenoma)  . Depression   . Diverticulosis   . Gastritis   . GERD (gastroesophageal reflux disease)   . H/O: GI bleed    Recurrent  . Hemorrhoids   . History of blood transfusion   . History of BPH   . History of kidney stones    hx of x 1   . Hypertension   . Pernicious anemia   . PVC (premature ventricular contraction)   . Rectal bleeding 02/15/12  . Vitamin D deficiency    Past Surgical History:  Procedure Laterality Date  . COLONOSCOPY WITH PROPOFOL N/A 03/17/2017   Procedure: COLONOSCOPY WITH PROPOFOL;  Surgeon: Carol Ada, MD;  Location: [facility];  Service: Endoscopy;  Laterality: N/A;  . [place]; ~ 2002   left; right  . PACEMAKER IMPLANT N/A 03/11/2019   Procedure: PACEMAKER IMPLANT;  Surgeon: Constance Haw, MD;  Location: [facility];  Service: Cardiovascular;  Laterality: N/A;  . TEMPORARY PACEMAKER N/A 03/10/2019   Procedure: TEMPORARY PACEMAKER;  Surgeon: Martinique, Peter M, MD;  Location: [facility];  Service: Cardiovascular;  Laterality: N/A;    Allergies  No Known Allergies  History of Present Illness    Mr. Homan was recently admitted to [facility] 03/10/2019 with symptomatic bradycardia, complete heart block, and and pauses  as long as 17 seconds.  He underwent PPM implant (SJM single-chamber) on 03/11/2019.  He presented to EP on 03/27/2019 and saw Tommye Standard PA-C, he was doing well at that time.  His PMH aslo includes essential hypertension, hypotension, atrial fibrillation chronic diastolic heart failure, complete heart block, diverticulosis, acute kidney injury, systolic murmur, bradycardia, hyponatremia, dyspnea morbid obesity, lower extremity edema, and dyslipidemia.  He presents to the clinic today and states that he is feeling much better.  He feels he is able to walk and get around much better at this time.  He states he is following a no salt diet and since having the pacemaker inserted he is not having any more low back/hip pain.  He states that when he would get out and walk his hips would become extremely painful.  He also would like to know if he can sleep on his left side because this is his primary sleeping orientation. We have advised him to follow with a recommendations from electrophysiology.  He goes on to state that his blood pressure at home is well controlled and usually runs in the 130s over 70s range.  He denies chest pain, shortness of breath, lower extremity edema, fatigue, palpitations, melena, hematuria, hemoptysis, diaphoresis, weakness, presyncope, syncope, orthopnea, and PND.    Home Medications    Prior to Admission medications  Medication Sig Start Date End Date Taking? Authorizing Provider  albuterol (VENTOLIN HFA) 108 (90 Base) MCG/ACT inhaler INHALE 2 PUFFS BY MOUTH EVERY 6 HOURS AS NEEDED FOR WHEEZING OR SHORTNESS OF BREATH 02/04/19   [name], MD  feeding supplement, ENSURE ENLIVE, (ENSURE ENLIVE) LIQD Take 237 mLs by mouth 2 (two) times daily between meals. 03/14/19   Barrett, Evelene Croon, PA-C  ferrous sulfate 325 (65 FE) MG tablet Take 325 mg by mouth every other day.     [provider]  fluticasone (FLONASE) 50 MCG/ACT nasal spray PLACE 1 SPRAY INTO BOTH NOSTRILS  DAILY. Patient taking differently: Place 1 spray into both nostrils as needed for allergies or rhinitis.  11/26/18   [name], MD  furosemide (LASIX) 40 MG tablet TAKE 2 TABLETS (80MG ) EVERY MORNING AND 1 TABLET (40MG ) EVERY EVENING Patient taking differently: Take 40 mg by mouth 2 (two) times daily.  11/27/18   [name], MD  losartan (COZAAR) 50 MG tablet Take 1 tablet (50 mg total) by mouth daily. 02/04/19   [name], MD  Multiple Vitamin (MULTIVITAMIN) tablet Take 1 tablet by mouth daily.    [provider]  multivitamin-lutein (OCUVITE-LUTEIN) CAPS Take 1 capsule by mouth daily.    [provider]  potassium chloride (K-DUR) 10 MEQ tablet TAKE 1 TABLET EVERY DAY Patient taking differently: Take 10 mEq by mouth daily.  05/30/18   [name], MD  XARELTO 20 MG TABS tablet TAKE 1 TABLET EVERY DAY WITH SUPPER Patient taking differently: Take 20 mg by mouth daily with supper.  09/14/18   [name], MD    Family History    Family History  Problem Relation Age of Onset  . Stroke Father 28  . Cancer Mother    He indicated that his mother is deceased. He indicated that his father is deceased. He indicated that his maternal grandmother is deceased. He indicated that his maternal grandfather is deceased. He indicated that his paternal grandmother is deceased. He indicated that his paternal grandfather is deceased.  Social History    Social History   Socioeconomic History  . Marital status: Widowed    Spouse name: Not on file  . Number of children: 1  . Years of education: Not on file  . Highest education level: Not on file  Occupational History  . Occupation: Retired  Scientific laboratory technician  . Financial resource strain: Not on file  . Food insecurity    Worry: Not on file    Inability: Not on file  . Transportation needs    Medical: Not on file    Non-medical: Not on file  Tobacco Use  . Smoking status: Never Smoker  . Smokeless  tobacco: Former Systems developer    Types: Snuff  . Tobacco comment: 02/15/12 "still use snuff @ times; not daily"  Substance and Sexual Activity  . Alcohol use: No  . Drug use: No  . Sexual activity: Never    Comment: widower, lives with granddaughter and great grandchildren.  Lifestyle  . Physical activity    Days per week: Not on file    Minutes per session: Not on file  . Stress: Not on file  Relationships  . Social Herbalist on phone: Not on file    Gets together: Not on file    Attends religious service: Not on file    Active member of club or organization: Not on file    Attends meetings  of clubs or organizations: Not on file    Relationship status: Not on file  . Intimate partner violence    Fear of current or ex partner: Not on file    Emotionally abused: Not on file    Physically abused: Not on file    Forced sexual activity: Not on file  Other Topics Concern  . Not on file  Social History Narrative   Lives with grand daughter.         Epworth Sleepiness Scale = 7 (as of 09/21/2015)     Review of Systems    General:  No chills, fever, night sweats or weight changes.  Cardiovascular:  No chest pain, dyspnea on exertion, edema, orthopnea, palpitations, paroxysmal nocturnal dyspnea. Dermatological: No rash, lesions/masses Respiratory: No cough, dyspnea Urologic: No hematuria, dysuria Abdominal:   No nausea, vomiting, diarrhea, bright red blood per rectum, melena, or hematemesis Neurologic:  No visual changes, wkns, changes in mental status. All other systems reviewed and are otherwise negative except as noted above.  Physical Exam    VS:  BP (!) 144/80   Pulse 88   Ht 5\' 8"  (1.727 m)   Wt 232 lb 3.2 oz (105.3 kg)   SpO2 96%   BMI 35.31 kg/m  , BMI Body mass index is 35.31 kg/m. GEN: Well nourished, well developed, in no acute distress. HEENT: normal. Neck: Supple, no JVD, carotid bruits, or masses. Cardiac: RRR, systolic murmur 2/6 left sternal border,  rubs, or gallops. No clubbing, cyanosis, edema.  Radials/DP/PT 2+ and equal bilaterally.  Respiratory:  Respirations regular and unlabored, clear to auscultation bilaterally. GI: Soft, nontender, nondistended, BS + x 4. MS: no deformity or atrophy. Skin: warm and dry, no rash. Neuro:  Strength and sensation are intact. Psych: Normal affect.  Accessory Clinical Findings    ECG personally reviewed by me today-none today  EKG 03/12/2019 Ventricular paced rhythm 70 bpm  Echocardiogram 03/12/2019  IMPRESSIONS    1. The left ventricle has normal systolic function with an ejection fraction of 60-65%. The cavity size was normal. There is mildly increased left ventricular wall thickness. Left ventricular diastolic Doppler parameters are indeterminate. No evidence  of left ventricular regional wall motion abnormalities.  2. The right ventricle has normal systolic function. The cavity was normal. There is no increase in right ventricular wall thickness.  3. Left atrial size was severely dilated.  4. Right atrial size was severely dilated.  5. No evidence of mitral valve stenosis. Mild eccentric regurgitation.  6. The aortic valve is tricuspid. Mild calcification of the aortic valve. No stenosis of the aortic valve.  7. The aorta is normal unless otherwise noted.  8. The aortic root is normal in size and structure.  9. The inferior vena cava was dilated in size with <50% respiratory variability. PA systolic pressure 60 mmHg.  Assessment & Plan   1.  Essential hypertension-blood pressure today 144/80.  Well-controlled at home 130s over 70s. Continue losartan 50 mg tablet daily Continue low-sodium heart healthy diet Keep blood pressure log and bring to next clinic visit Increase physical activity as tolerated Continue support stocking use  2.  Chronic diastolic CHF-echocardiogram 03/12/2019 showed LVEF 60 to 65%, mild LVH, Left ventricular diastolic Doppler parameters are indeterminate.   Left atrial size severely dilated right atrium severely dilated, mitral valve stenosis, mild eccentric regurgitation, mild calcification of the aortic valve Continue support stocking use Decrease Lasix 80 mg a.m. and 40 mg p.m.- okay to take an extra dose  of Lasix if weight gain of more than 3 pounds overnight or 5 pounds in 1 week Low-sodium heart healthy diet-salty 6 given  3.  Permanent pacemaker-SJM single-chamber implanted on 03/11/2019. Had follow-up visit with EP on 03/27/2019 with 39-month follow-up scheduled Continue activities restrictions for the next 4 weeks Left chest pacemaker insertion site C/D/I  4.  Persistent atrial fibrillation-PPM implant (SJM single-chamber) on 03/11/2019. Continue Xarelto 20 mg tablet daily CHA2DS2Vasc is 3  5.  Acute kidney injury- creatinine 0.89, BUN 11 on 03/26/2019-stable Decreased Lasix dosing to 80 mg a.m. and 40 mg p.m. Monitored by PCP  Disposition: Follow-up with Dr. Percival Spanish in 3 months.  Deberah Pelton, NP-C 03/29/2019, 9:52 AM

## 2019-04-02 ENCOUNTER — Other Ambulatory Visit: Payer: Self-pay

## 2019-04-02 ENCOUNTER — Encounter: Payer: Self-pay | Admitting: Family Medicine

## 2019-04-02 ENCOUNTER — Ambulatory Visit (INDEPENDENT_AMBULATORY_CARE_PROVIDER_SITE_OTHER): Payer: Medicare HMO | Admitting: Family Medicine

## 2019-04-02 VITALS — BP 172/100 | HR 92 | Temp 98.2°F | Resp 22 | Ht 68.0 in | Wt 228.0 lb

## 2019-04-02 DIAGNOSIS — I5042 Chronic combined systolic (congestive) and diastolic (congestive) heart failure: Secondary | ICD-10-CM | POA: Diagnosis not present

## 2019-04-02 DIAGNOSIS — I110 Hypertensive heart disease with heart failure: Secondary | ICD-10-CM | POA: Diagnosis not present

## 2019-04-02 DIAGNOSIS — F419 Anxiety disorder, unspecified: Secondary | ICD-10-CM | POA: Diagnosis not present

## 2019-04-02 DIAGNOSIS — M1990 Unspecified osteoarthritis, unspecified site: Secondary | ICD-10-CM | POA: Diagnosis not present

## 2019-04-02 DIAGNOSIS — E559 Vitamin D deficiency, unspecified: Secondary | ICD-10-CM | POA: Diagnosis not present

## 2019-04-02 DIAGNOSIS — I4821 Permanent atrial fibrillation: Secondary | ICD-10-CM | POA: Diagnosis not present

## 2019-04-02 DIAGNOSIS — D510 Vitamin B12 deficiency anemia due to intrinsic factor deficiency: Secondary | ICD-10-CM | POA: Diagnosis not present

## 2019-04-02 DIAGNOSIS — I10 Essential (primary) hypertension: Secondary | ICD-10-CM | POA: Diagnosis not present

## 2019-04-02 DIAGNOSIS — Z48812 Encounter for surgical aftercare following surgery on the circulatory system: Secondary | ICD-10-CM | POA: Diagnosis not present

## 2019-04-02 DIAGNOSIS — N400 Enlarged prostate without lower urinary tract symptoms: Secondary | ICD-10-CM | POA: Diagnosis not present

## 2019-04-02 NOTE — Progress Notes (Signed)
Subjective:    Patient ID: Austin Gray., male    DOB: 10-05-1931, 85 y.o.   MRN: [id number]  HPI  Patient's granddaughter called me on call recently stating that the patient's heart rate was in the 30s and that he was having trouble breathing.  I recommended they go to the emergency room immediately.  Patient was admitted to the hospital with symptomatic bradycardia due to complete heart block and received a pacemaker.  He is here today for hospital follow-up.  I have copied relevant portions of his discharge summary below for my reference: Admit date: 03/10/2019 Discharge date: 03/14/2019  Diagnostic Studies/Procedures    ECHO: 03/12/2019 1. The left ventricle has normal systolic function with an ejection fraction of 60-65%. The cavity size was normal. There is mildly increased left ventricular wall thickness. Left ventricular diastolic Doppler parameters are indeterminate. No evidence  of left ventricular regional wall motion abnormalities. 2. The right ventricle has normal systolic function. The cavity was normal. There is no increase in right ventricular wall thickness. 3. Left atrial size was severely dilated. 4. Right atrial size was severely dilated. 5. No evidence of mitral valve stenosis. Mild eccentric regurgitation. 6. The aortic valve is tricuspid. Mild calcification of the aortic valve. No stenosis of the aortic valve. 7. The aorta is normal unless otherwise noted. 8. The aortic root is normal in size and structure. 9. The inferior vena cava was dilated in size with <50% respiratory variability. PA systolic pressure 60 mmHg.  PROCEDURES: 03/11/2019 1. Pacemaker implantation.    History of Present Illness     85 yo male w/ hx HTN, permanent Afib, MR, was admitted 08/23 with increasing DOE and bradycardia.   Hospital Course     Consultants: EP   His HR was was in the 30s, complete heart block.  He also had a 17-second pause in the ER which was  symptomatic, with confusion.  He was taken to the lab and a temp wire was placed.  A temp wire rate had to be increased overnight to prevent hypotension.  He was seen by EP, and had a [place] dual lead pacemaker implanted without complication.  He is off AV nodal blocking agents.  His Xarelto was held, but he is to resume that on 8/27.  He had no bleeding issues.  Post-procedure chest x-ray showed no pneumothorax.  He was volume overloaded, and required IV Lasix 80 mg every 8 hours for diuresis.  The acute on chronic diastolic CHF was felt secondary to the bradycardia.  He diuresed almost 18 L and his weight came down 16 pounds.    He had hypokalemia secondary to diuresis and his potassium was supplemented.   His discharge weight is 238 pounds.  He is to resume his home Lasix dosing of 80 mg a.m. and 40 mg p.m.  His blood pressure was elevated at times.  His home amlodipine, cardura and losartan had been held. The losartan was restarted at discharge. Restart amlodipine and/or Cardura at follow-up as indicated 03/22/19  Patient is here today for follow-up.  He states he still feels very weak and tired.  He has very little stamina.  His blood pressure at home has been between 104 and 141/58-83.  However he resumed his Cardura and amlodipine after discharge by accident.  He denies any shortness of breath however he has +2 pitting edema in his right leg and +2 pitting edema in his left leg up to his knee.  He appears  fluid overloaded on today's exam.  His heart rate is stable in the 70s.  Pulse oximetry is between 92 and 97% at home.  They are monitoring that with a pulse ox at home.  He denies any chest pain.  He denies any dysuria.  He denies any altered mental status or confusion.  He does report constipation.  At that time, my plan was: Spent 25 minutes today with the patient discussing his medical condition.  Recommended that he discontinue amlodipine and Cardura.  Monitor blood pressure  closely.  However I believe he needs increased diuresis based on his clinical exam.  Therefore I will increase his Lasix to 80 mg twice daily and recheck the patient next week.  Obtain a baseline BMP today and monitor his hemoglobin given his recent surgery.  Monitor his potassium and renal function next week at follow-up and also watch his peripheral edema closely.  Patient received his flu shot today.  03/26/19 No visits with results within 1 Week(s) from this visit.  Latest known visit with results is:  Office Visit on 03/26/2019  Component Date Value Ref Range Status  . Glucose, Bld 03/26/2019 107* 65 - 99 mg/dL Final   Comment: .            Fasting reference interval . For someone without known diabetes, a glucose value between 100 and 125 mg/dL is consistent with prediabetes and should be confirmed with a follow-up test. .   . BUN 03/26/2019 11  7 - 25 mg/dL Final  . Creat 03/26/2019 0.89  0.70 - 1.11 mg/dL Final   Comment: For patients >63 years of age, the reference limit for Creatinine is approximately 13% higher for people identified as African-American. .   . GFR, Est Non African American 03/26/2019 78  > OR = 60 mL/min/1.82m2 Final  . GFR, Est African American 03/26/2019 90  > OR = 60 mL/min/1.57m2 Final  . BUN/Creatinine Ratio [id number] NOT APPLICABLE  6 - 22 (calc) Final  . Sodium 03/26/2019 140  135 - 146 mmol/L Final  . Potassium 03/26/2019 3.6  3.5 - 5.3 mmol/L Final  . Chloride 03/26/2019 100  98 - 110 mmol/L Final  . CO2 03/26/2019 31  20 - 32 mmol/L Final  . Calcium 03/26/2019 9.7  8.6 - 10.3 mg/dL Final   Patient has lost 7 pounds since his appointment last week.  Last week he was 242 pounds.  Today he is 235!Marland Kitchen  He looks much better.  Although he discontinued his blood pressure medicines at his last appointment, his blood pressure remains well controlled today.  He is still taking losartan.  He denies any chest pain.  He denies any shortness of breath.  He denies  any orthopnea.  Today he is in normal sinus rhythm.  His lungs are clear to auscultation with no evidence of pulmonary edema.  He still has trace bipedal edema but much better than last week.  At that time, my plan was: Patient appears euvolemic today and seems to be doing better on the higher dose of Lasix.  His blood pressure remains well controlled despite stopping his Cardura and amlodipine.  He is still taking losartan.  I will recheck renal function today.  As long as his renal function is stable I will continue the patient on the higher dose of Lasix 80 mg twice daily and then reassess the patient in 1 week.  If he maintains stability in his weight, blood pressure, and renal function, we will maintain  this patient long-term on Lasix 80 mg twice daily.  However if he does show evidence of prerenal azotemia today on his lab work, we will need to reduce his Lasix dose.  04/02/19 Patient presents today with elevated blood pressure.  He states that he saw his cardiologist last week and they reduced his Lasix dose to 80 mg in the morning and 40 mg in the evening.  He continues to have very little swelling.  However he states that he just started the lower dose today.  However he is concerned because his blood pressure has been steadily increasing as his strength is improved.  His blood pressure today is up to 172/100.  He continue to use losartan however he has been holding doxazosin and amlodipine as I previously directed.  He denies any chest pain shortness of breath or dyspnea on exertion Past Medical History:  Diagnosis Date  . Anxiety   . Arthritis    "little bit; left knee"  . Atrial fibrillation (HCC)    Chronic  . Basal cell carcinoma   . Cataracts, bilateral    to have left eye cataract surgery on 03/21/17   . CHF (congestive heart failure), NYHA class II, chronic, diastolic ([place])   . Colon polyps    last colonoscopy 2015 (tubular adenoma)  . Depression   . Diverticulosis   . Gastritis    . GERD (gastroesophageal reflux disease)   . H/O: GI bleed    Recurrent  . Hemorrhoids   . History of blood transfusion   . History of BPH   . History of kidney stones    hx of x 1   . Hypertension   . Pernicious anemia   . PVC (premature ventricular contraction)   . Rectal bleeding 02/15/12  . Vitamin D deficiency    Past Surgical History:  Procedure Laterality Date  . COLONOSCOPY WITH PROPOFOL N/A 03/17/2017   Procedure: COLONOSCOPY WITH PROPOFOL;  Surgeon: Carol Ada, MD;  Location: [facility];  Service: Endoscopy;  Laterality: N/A;  . [place]; ~ 2002   left; right  . PACEMAKER IMPLANT N/A 03/11/2019   Procedure: PACEMAKER IMPLANT;  Surgeon: Constance Haw, MD;  Location: [facility];  Service: Cardiovascular;  Laterality: N/A;  . TEMPORARY PACEMAKER N/A 03/10/2019   Procedure: TEMPORARY PACEMAKER;  Surgeon: Martinique, Peter M, MD;  Location: [facility];  Service: Cardiovascular;  Laterality: N/A;   Current Outpatient Medications on File Prior to Visit  Medication Sig Dispense Refill  . albuterol (VENTOLIN HFA) 108 (90 Base) MCG/ACT inhaler INHALE 2 PUFFS BY MOUTH EVERY 6 HOURS AS NEEDED FOR WHEEZING OR SHORTNESS OF BREATH 18 g 3  . feeding supplement, ENSURE ENLIVE, (ENSURE ENLIVE) LIQD Take 237 mLs by mouth 2 (two) times daily between meals. 237 mL 12  . ferrous sulfate 325 (65 FE) MG tablet Take 325 mg by mouth every other day.     . fluticasone (FLONASE) 50 MCG/ACT nasal spray PLACE 1 SPRAY INTO BOTH NOSTRILS DAILY. 16 g 5  . furosemide (LASIX) 40 MG tablet Take 40 mg by mouth 2 (two) times daily.    Marland Kitchen losartan (COZAAR) 50 MG tablet Take 1 tablet (50 mg total) by mouth daily. 90 tablet 3  . Multiple Vitamin (MULTIVITAMIN) tablet Take 1 tablet by mouth daily.    . multivitamin-lutein (OCUVITE-LUTEIN) CAPS Take 1 capsule by mouth daily.    . potassium chloride (K-DUR) 10 MEQ tablet TAKE 1 TABLET EVERY DAY  90 tablet 3  . XARELTO 20 MG  TABS tablet TAKE 1 TABLET EVERY DAY WITH SUPPER 90 tablet 3   Current Facility-Administered Medications on File Prior to Visit  Medication Dose Route Frequency Provider Last Rate Last Dose  . cyanocobalamin ((VITAMIN B-12)) injection 1,000 mcg  1,000 mcg Intramuscular Q30 days [name], MD   1,000 mcg at 03/22/19 1139   No Known Allergies Social History   Socioeconomic History  . Marital status: Widowed    Spouse name: Not on file  . Number of children: 1  . Years of education: Not on file  . Highest education level: Not on file  Occupational History  . Occupation: Retired  Scientific laboratory technician  . Financial resource strain: Not on file  . Food insecurity    Worry: Not on file    Inability: Not on file  . Transportation needs    Medical: Not on file    Non-medical: Not on file  Tobacco Use  . Smoking status: Never Smoker  . Smokeless tobacco: Former Systems developer    Types: Snuff  . Tobacco comment: 02/15/12 "still use snuff @ times; not daily"  Substance and Sexual Activity  . Alcohol use: No  . Drug use: No  . Sexual activity: Never    Comment: widower, lives with granddaughter and great grandchildren.  Lifestyle  . Physical activity    Days per week: Not on file    Minutes per session: Not on file  . Stress: Not on file  Relationships  . Social Herbalist on phone: Not on file    Gets together: Not on file    Attends religious service: Not on file    Active member of club or organization: Not on file    Attends meetings of clubs or organizations: Not on file    Relationship status: Not on file  . Intimate partner violence    Fear of current or ex partner: Not on file    Emotionally abused: Not on file    Physically abused: Not on file    Forced sexual activity: Not on file  Other Topics Concern  . Not on file  Social History Narrative   Lives with grand daughter.         Epworth Sleepiness Scale = 7 (as of 09/21/2015)     Review of Systems  All other  systems reviewed and are negative.      Objective:   Physical Exam Vitals signs reviewed.  Constitutional:      General: He is not in acute distress.    Appearance: Normal appearance. He is normal weight. He is not ill-appearing, toxic-appearing or diaphoretic.  Cardiovascular:     Rate and Rhythm: Normal rate and regular rhythm.     Heart sounds: Normal heart sounds.  Pulmonary:     Effort: Pulmonary effort is normal.     Breath sounds: Normal breath sounds. No wheezing, rhonchi or rales.  Abdominal:     General: Abdomen is flat.     Palpations: Abdomen is soft.  Musculoskeletal:     Right lower leg: No edema.     Left lower leg: No edema.  Skin:    Coloration: Skin is not jaundiced.  Neurological:     Mental Status: He is alert.           Assessment & Plan:  Benign essential HTN - Plan: BASIC METABOLIC PANEL WITH GFR  Patient's edema has resolved.  We will need  to monitor him closely to see if his edema returns on the lower dose of Lasix.  He will continue 80 mg in the morning and 40 mg in the evening at the present time and notify me via telephone in 1 week of how he is doing.  Resume amlodipine 10 mg a day for hypertension and recheck blood pressure in 1 week.  If still elevated we will resume his doxazosin at that point.

## 2019-04-03 LAB — BASIC METABOLIC PANEL WITH GFR
BUN: 12 mg/dL (ref 7–25)
CO2: 29 mmol/L (ref 20–32)
Calcium: 9.7 mg/dL (ref 8.6–10.3)
Chloride: 99 mmol/L (ref 98–110)
Creat: 0.9 mg/dL (ref 0.70–1.11)
GFR, Est African American: 90 mL/min/{1.73_m2} (ref >=60)
GFR, Est Non African American: 78 mL/min/{1.73_m2} (ref >=60)
Glucose, Bld: 114 mg/dL — ABNORMAL HIGH (ref 65–99)
Potassium: 3.3 mmol/L — ABNORMAL LOW (ref 3.5–5.3)
Sodium: 138 mmol/L (ref 135–146)

## 2019-04-03 LAB — EXTRA LAV TOP TUBE

## 2019-04-04 DIAGNOSIS — I110 Hypertensive heart disease with heart failure: Secondary | ICD-10-CM | POA: Diagnosis not present

## 2019-04-04 DIAGNOSIS — E559 Vitamin D deficiency, unspecified: Secondary | ICD-10-CM | POA: Diagnosis not present

## 2019-04-04 DIAGNOSIS — I4821 Permanent atrial fibrillation: Secondary | ICD-10-CM | POA: Diagnosis not present

## 2019-04-04 DIAGNOSIS — F419 Anxiety disorder, unspecified: Secondary | ICD-10-CM | POA: Diagnosis not present

## 2019-04-04 DIAGNOSIS — D510 Vitamin B12 deficiency anemia due to intrinsic factor deficiency: Secondary | ICD-10-CM | POA: Diagnosis not present

## 2019-04-04 DIAGNOSIS — N400 Enlarged prostate without lower urinary tract symptoms: Secondary | ICD-10-CM | POA: Diagnosis not present

## 2019-04-04 DIAGNOSIS — M1990 Unspecified osteoarthritis, unspecified site: Secondary | ICD-10-CM | POA: Diagnosis not present

## 2019-04-04 DIAGNOSIS — Z48812 Encounter for surgical aftercare following surgery on the circulatory system: Secondary | ICD-10-CM | POA: Diagnosis not present

## 2019-04-04 DIAGNOSIS — I5042 Chronic combined systolic (congestive) and diastolic (congestive) heart failure: Secondary | ICD-10-CM | POA: Diagnosis not present

## 2019-04-08 ENCOUNTER — Encounter (INDEPENDENT_AMBULATORY_CARE_PROVIDER_SITE_OTHER): Payer: Medicare HMO | Admitting: Ophthalmology

## 2019-04-10 DIAGNOSIS — F419 Anxiety disorder, unspecified: Secondary | ICD-10-CM | POA: Diagnosis not present

## 2019-04-10 DIAGNOSIS — N400 Enlarged prostate without lower urinary tract symptoms: Secondary | ICD-10-CM | POA: Diagnosis not present

## 2019-04-10 DIAGNOSIS — I5042 Chronic combined systolic (congestive) and diastolic (congestive) heart failure: Secondary | ICD-10-CM | POA: Diagnosis not present

## 2019-04-10 DIAGNOSIS — E559 Vitamin D deficiency, unspecified: Secondary | ICD-10-CM | POA: Diagnosis not present

## 2019-04-10 DIAGNOSIS — Z48812 Encounter for surgical aftercare following surgery on the circulatory system: Secondary | ICD-10-CM | POA: Diagnosis not present

## 2019-04-10 DIAGNOSIS — M1990 Unspecified osteoarthritis, unspecified site: Secondary | ICD-10-CM | POA: Diagnosis not present

## 2019-04-10 DIAGNOSIS — D510 Vitamin B12 deficiency anemia due to intrinsic factor deficiency: Secondary | ICD-10-CM | POA: Diagnosis not present

## 2019-04-10 DIAGNOSIS — I4821 Permanent atrial fibrillation: Secondary | ICD-10-CM | POA: Diagnosis not present

## 2019-04-10 DIAGNOSIS — I110 Hypertensive heart disease with heart failure: Secondary | ICD-10-CM | POA: Diagnosis not present

## 2019-04-11 ENCOUNTER — Other Ambulatory Visit: Payer: Self-pay

## 2019-04-11 ENCOUNTER — Encounter: Payer: Self-pay | Admitting: Family Medicine

## 2019-04-11 ENCOUNTER — Ambulatory Visit (INDEPENDENT_AMBULATORY_CARE_PROVIDER_SITE_OTHER): Payer: Medicare HMO | Admitting: Family Medicine

## 2019-04-11 VITALS — BP 136/80 | HR 97 | Temp 98.1°F | Resp 24 | Ht 68.0 in | Wt 225.0 lb

## 2019-04-11 DIAGNOSIS — M1990 Unspecified osteoarthritis, unspecified site: Secondary | ICD-10-CM | POA: Diagnosis not present

## 2019-04-11 DIAGNOSIS — N400 Enlarged prostate without lower urinary tract symptoms: Secondary | ICD-10-CM | POA: Diagnosis not present

## 2019-04-11 DIAGNOSIS — I5032 Chronic diastolic (congestive) heart failure: Secondary | ICD-10-CM | POA: Diagnosis not present

## 2019-04-11 DIAGNOSIS — I10 Essential (primary) hypertension: Secondary | ICD-10-CM

## 2019-04-11 DIAGNOSIS — Z48812 Encounter for surgical aftercare following surgery on the circulatory system: Secondary | ICD-10-CM | POA: Diagnosis not present

## 2019-04-11 DIAGNOSIS — F419 Anxiety disorder, unspecified: Secondary | ICD-10-CM | POA: Diagnosis not present

## 2019-04-11 DIAGNOSIS — D510 Vitamin B12 deficiency anemia due to intrinsic factor deficiency: Secondary | ICD-10-CM | POA: Diagnosis not present

## 2019-04-11 DIAGNOSIS — E559 Vitamin D deficiency, unspecified: Secondary | ICD-10-CM | POA: Diagnosis not present

## 2019-04-11 DIAGNOSIS — I4821 Permanent atrial fibrillation: Secondary | ICD-10-CM | POA: Diagnosis not present

## 2019-04-11 DIAGNOSIS — I5042 Chronic combined systolic (congestive) and diastolic (congestive) heart failure: Secondary | ICD-10-CM | POA: Diagnosis not present

## 2019-04-11 DIAGNOSIS — I110 Hypertensive heart disease with heart failure: Secondary | ICD-10-CM | POA: Diagnosis not present

## 2019-04-11 LAB — BASIC METABOLIC PANEL WITH GFR
BUN: 13 mg/dL (ref 7–25)
CO2: 29 mmol/L (ref 20–32)
Calcium: 9.5 mg/dL (ref 8.6–10.3)
Chloride: 100 mmol/L (ref 98–110)
Creat: 1.1 mg/dL (ref 0.70–1.11)
GFR, Est African American: 71 mL/min/{1.73_m2} (ref >=60)
GFR, Est Non African American: 61 mL/min/{1.73_m2} (ref >=60)
Glucose, Bld: 128 mg/dL — ABNORMAL HIGH (ref 65–99)
Potassium: 3.9 mmol/L (ref 3.5–5.3)
Sodium: 138 mmol/L (ref 135–146)

## 2019-04-11 NOTE — Progress Notes (Signed)
Subjective:    Patient ID: Austin Mesi., male    DOB: 10-05-1931, 85 y.o.   MRN: [id number]  HPI  Patient's granddaughter called me on call recently stating that the patient's heart rate was in the 30s and that he was having trouble breathing.  I recommended they go to the emergency room immediately.  Patient was admitted to the hospital with symptomatic bradycardia due to complete heart block and received a pacemaker.  He is here today for hospital follow-up.  I have copied relevant portions of his discharge summary below for my reference: Admit date: 03/10/2019 Discharge date: 03/14/2019  Diagnostic Studies/Procedures    ECHO: 03/12/2019 1. The left ventricle has normal systolic function with an ejection fraction of 60-65%. The cavity size was normal. There is mildly increased left ventricular wall thickness. Left ventricular diastolic Doppler parameters are indeterminate. No evidence  of left ventricular regional wall motion abnormalities. 2. The right ventricle has normal systolic function. The cavity was normal. There is no increase in right ventricular wall thickness. 3. Left atrial size was severely dilated. 4. Right atrial size was severely dilated. 5. No evidence of mitral valve stenosis. Mild eccentric regurgitation. 6. The aortic valve is tricuspid. Mild calcification of the aortic valve. No stenosis of the aortic valve. 7. The aorta is normal unless otherwise noted. 8. The aortic root is normal in size and structure. 9. The inferior vena cava was dilated in size with <50% respiratory variability. PA systolic pressure 60 mmHg.  PROCEDURES: 03/11/2019 1. Pacemaker implantation.    History of Present Illness     85 yo male w/ hx HTN, permanent Afib, MR, was admitted 08/23 with increasing DOE and bradycardia.   Hospital Course     Consultants: EP   His HR was was in the 30s, complete heart block.  He also had a 17-second pause in the ER which was  symptomatic, with confusion.  He was taken to the lab and a temp wire was placed.  A temp wire rate had to be increased overnight to prevent hypotension.  He was seen by EP, and had a [place] dual lead pacemaker implanted without complication.  He is off AV nodal blocking agents.  His Xarelto was held, but he is to resume that on 8/27.  He had no bleeding issues.  Post-procedure chest x-ray showed no pneumothorax.  He was volume overloaded, and required IV Lasix 80 mg every 8 hours for diuresis.  The acute on chronic diastolic CHF was felt secondary to the bradycardia.  He diuresed almost 18 L and his weight came down 16 pounds.    He had hypokalemia secondary to diuresis and his potassium was supplemented.   His discharge weight is 238 pounds.  He is to resume his home Lasix dosing of 80 mg a.m. and 40 mg p.m.  His blood pressure was elevated at times.  His home amlodipine, cardura and losartan had been held. The losartan was restarted at discharge. Restart amlodipine and/or Cardura at follow-up as indicated 03/22/19  Patient is here today for follow-up.  He states he still feels very weak and tired.  He has very little stamina.  His blood pressure at home has been between 104 and 141/58-83.  However he resumed his Cardura and amlodipine after discharge by accident.  He denies any shortness of breath however he has +2 pitting edema in his right leg and +2 pitting edema in his left leg up to his knee.  He appears  fluid overloaded on today's exam.  His heart rate is stable in the 70s.  Pulse oximetry is between 92 and 97% at home.  They are monitoring that with a pulse ox at home.  He denies any chest pain.  He denies any dysuria.  He denies any altered mental status or confusion.  He does report constipation.  At that time, my plan was: Spent 25 minutes today with the patient discussing his medical condition.  Recommended that he discontinue amlodipine and Cardura.  Monitor blood pressure  closely.  However I believe he needs increased diuresis based on his clinical exam.  Therefore I will increase his Lasix to 80 mg twice daily and recheck the patient next week.  Obtain a baseline BMP today and monitor his hemoglobin given his recent surgery.  Monitor his potassium and renal function next week at follow-up and also watch his peripheral edema closely.  Patient received his flu shot today.  03/26/19 No visits with results within 1 Week(s) from this visit.  Latest known visit with results is:  Office Visit on 04/02/2019  Component Date Value Ref Range Status  . Glucose, Bld 04/02/2019 114* 65 - 99 mg/dL Final   Comment: .            Fasting reference interval . For someone without known diabetes, a glucose value between 100 and 125 mg/dL is consistent with prediabetes and should be confirmed with a follow-up test. .   . BUN 04/02/2019 12  7 - 25 mg/dL Final  . Creat 04/02/2019 0.90  0.70 - 1.11 mg/dL Final   Comment: For patients >46 years of age, the reference limit for Creatinine is approximately 13% higher for people identified as African-American. .   . GFR, Est Non African American 04/02/2019 78  > OR = 60 mL/min/1.33m2 Final  . GFR, Est African American 04/02/2019 90  > OR = 60 mL/min/1.68m2 Final  . BUN/Creatinine Ratio 99991111 NOT APPLICABLE  6 - 22 (calc) Final  . Sodium 04/02/2019 138  135 - 146 mmol/L Final  . Potassium 04/02/2019 3.3* 3.5 - 5.3 mmol/L Final  . Chloride 04/02/2019 99  98 - 110 mmol/L Final  . CO2 04/02/2019 29  20 - 32 mmol/L Final  . Calcium 04/02/2019 9.7  8.6 - 10.3 mg/dL Final  . EXTRA LAVENDER-TOP TUBE 04/02/2019    Final   Comment: We received an extra specimen with no test requested. If any test is desired for this specimen please call client services and advise.    Patient has lost 7 pounds since his appointment last week.  Last week he was 242 pounds.  Today he is 235!Marland Kitchen  He looks much better.  Although he discontinued his blood  pressure medicines at his last appointment, his blood pressure remains well controlled today.  He is still taking losartan.  He denies any chest pain.  He denies any shortness of breath.  He denies any orthopnea.  Today he is in normal sinus rhythm.  His lungs are clear to auscultation with no evidence of pulmonary edema.  He still has trace bipedal edema but much better than last week.  At that time, my plan was: Patient appears euvolemic today and seems to be doing better on the higher dose of Lasix.  His blood pressure remains well controlled despite stopping his Cardura and amlodipine.  He is still taking losartan.  I will recheck renal function today.  As long as his renal function is stable I will continue  the patient on the higher dose of Lasix 80 mg twice daily and then reassess the patient in 1 week.  If he maintains stability in his weight, blood pressure, and renal function, we will maintain this patient long-term on Lasix 80 mg twice daily.  However if he does show evidence of prerenal azotemia today on his lab work, we will need to reduce his Lasix dose.  04/02/19 Patient presents today with elevated blood pressure.  He states that he saw his cardiologist last week and they reduced his Lasix dose to 80 mg in the morning and 40 mg in the evening.  He continues to have very little swelling.  However he states that he just started the lower dose today.  However he is concerned because his blood pressure has been steadily increasing as his strength is improved.  His blood pressure today is up to 172/100.  He continue to use losartan however he has been holding doxazosin and amlodipine as I previously directed.  He denies any chest pain shortness of breath or dyspnea on exertion.  At that time, my plan was:  Patient's edema has resolved.  We will need to monitor him closely to see if his edema returns on the lower dose of Lasix.  He will continue 80 mg in the morning and 40 mg in the evening at the  present time and notify me via telephone in 1 week of how he is doing.  Resume amlodipine 10 mg a day for hypertension and recheck blood pressure in 1 week.  If still elevated we will resume his doxazosin at that point.  04/11/19 Patient is here today to recheck his blood pressure.  His blood pressure here is much better at 136/80.  He states that he feels better.  His physical therapist has released him.  He is getting stronger and is able to ambulate now using a cane.  He has lost 3 additional pounds since I saw him.  There is no evidence of fluid overload on exam.  He denies any dizziness or lightheadedness or dry mouth but I am concerned that we may be over diuresing the patient now.  He appears euvolemic today on exam.  He is still taking 80 mg of Lasix in the morning and 40 mg in the evening.  He is on 10 mEq of potassium every morning.  He denies any chest pain, shortness of breath, dyspnea on exertion Past Medical History:  Diagnosis Date  . Anxiety   . Arthritis    "little bit; left knee"  . Atrial fibrillation (HCC)    Chronic  . Basal cell carcinoma   . Cataracts, bilateral    to have left eye cataract surgery on 03/21/17   . CHF (congestive heart failure), NYHA class II, chronic, diastolic ([place])   . Colon polyps    last colonoscopy 2015 (tubular adenoma)  . Depression   . Diverticulosis   . Gastritis   . GERD (gastroesophageal reflux disease)   . H/O: GI bleed    Recurrent  . Hemorrhoids   . History of blood transfusion   . History of BPH   . History of kidney stones    hx of x 1   . Hypertension   . Pernicious anemia   . PVC (premature ventricular contraction)   . Rectal bleeding 02/15/12  . Vitamin D deficiency    Past Surgical History:  Procedure Laterality Date  . COLONOSCOPY WITH PROPOFOL N/A 03/17/2017   Procedure: COLONOSCOPY WITH PROPOFOL;  Surgeon: Carol Ada, MD;  Location: [facility];  Service: Endoscopy;  Laterality: N/A;  . [place];  ~ 2002   left; right  . PACEMAKER IMPLANT N/A 03/11/2019   Procedure: PACEMAKER IMPLANT;  Surgeon: Constance Haw, MD;  Location: [facility];  Service: Cardiovascular;  Laterality: N/A;  . TEMPORARY PACEMAKER N/A 03/10/2019   Procedure: TEMPORARY PACEMAKER;  Surgeon: Martinique, Peter M, MD;  Location: [facility];  Service: Cardiovascular;  Laterality: N/A;   Current Outpatient Medications on File Prior to Visit  Medication Sig Dispense Refill  . albuterol (VENTOLIN HFA) 108 (90 Base) MCG/ACT inhaler INHALE 2 PUFFS BY MOUTH EVERY 6 HOURS AS NEEDED FOR WHEEZING OR SHORTNESS OF BREATH 18 g 3  . amLODipine (NORVASC) 10 MG tablet Take 10 mg by mouth daily.    . feeding supplement, ENSURE ENLIVE, (ENSURE ENLIVE) LIQD Take 237 mLs by mouth 2 (two) times daily between meals. 237 mL 12  . ferrous sulfate 325 (65 FE) MG tablet Take 325 mg by mouth every other day.     . fluticasone (FLONASE) 50 MCG/ACT nasal spray PLACE 1 SPRAY INTO BOTH NOSTRILS DAILY. 16 g 5  . furosemide (LASIX) 40 MG tablet Take 40 mg by mouth 2 (two) times daily.    Marland Kitchen losartan (COZAAR) 50 MG tablet Take 1 tablet (50 mg total) by mouth daily. 90 tablet 3  . Multiple Vitamin (MULTIVITAMIN) tablet Take 1 tablet by mouth daily.    . multivitamin-lutein (OCUVITE-LUTEIN) CAPS Take 1 capsule by mouth daily.    . potassium chloride (K-DUR) 10 MEQ tablet TAKE 1 TABLET EVERY DAY 90 tablet 3  . XARELTO 20 MG TABS tablet TAKE 1 TABLET EVERY DAY WITH SUPPER 90 tablet 3   Current Facility-Administered Medications on File Prior to Visit  Medication Dose Route Frequency Provider Last Rate Last Dose  . cyanocobalamin ((VITAMIN B-12)) injection 1,000 mcg  1,000 mcg Intramuscular Q30 days [name], MD   1,000 mcg at 03/22/19 1139   No Known Allergies Social History   Socioeconomic History  . Marital status: Widowed    Spouse name: Not on file  . Number of children: 1  . Years of education: Not on file  . Highest  education level: Not on file  Occupational History  . Occupation: Retired  Scientific laboratory technician  . Financial resource strain: Not on file  . Food insecurity    Worry: Not on file    Inability: Not on file  . Transportation needs    Medical: Not on file    Non-medical: Not on file  Tobacco Use  . Smoking status: Never Smoker  . Smokeless tobacco: Former Systems developer    Types: Snuff  . Tobacco comment: 02/15/12 "still use snuff @ times; not daily"  Substance and Sexual Activity  . Alcohol use: No  . Drug use: No  . Sexual activity: Never    Comment: widower, lives with granddaughter and great grandchildren.  Lifestyle  . Physical activity    Days per week: Not on file    Minutes per session: Not on file  . Stress: Not on file  Relationships  . Social Herbalist on phone: Not on file    Gets together: Not on file    Attends religious service: Not on file    Active member of club or organization: Not on file    Attends meetings of clubs or organizations: Not on file  Relationship status: Not on file  . Intimate partner violence    Fear of current or ex partner: Not on file    Emotionally abused: Not on file    Physically abused: Not on file    Forced sexual activity: Not on file  Other Topics Concern  . Not on file  Social History Narrative   Lives with grand daughter.         Epworth Sleepiness Scale = 7 (as of 09/21/2015)     Review of Systems  All other systems reviewed and are negative.      Objective:   Physical Exam Vitals signs reviewed.  Constitutional:      General: He is not in acute distress.    Appearance: Normal appearance. He is normal weight. He is not ill-appearing, toxic-appearing or diaphoretic.  Cardiovascular:     Rate and Rhythm: Normal rate and regular rhythm.     Heart sounds: Normal heart sounds.  Pulmonary:     Effort: Pulmonary effort is normal.     Breath sounds: Normal breath sounds. No wheezing, rhonchi or rales.  Abdominal:      General: Abdomen is flat.     Palpations: Abdomen is soft.  Musculoskeletal:     Right lower leg: No edema.     Left lower leg: No edema.  Skin:    Coloration: Skin is not jaundiced.  Neurological:     Mental Status: He is alert.           Assessment & Plan:  Benign essential HTN - Plan: BASIC METABOLIC PANEL WITH GFR  Chronic diastolic heart failure ([place]) - Plan: BASIC METABOLIC PANEL WITH GFR Patient appears to be at his baseline.  He is doing well.  His strength is improving.  His blood pressure is excellent.  My only concern is the additional weight loss may be a sign of overdiuresis.  Therefore I will check a BMP.  If his BMP shows an elevation in his creatinine, I would reduce his Lasix to 40 mg twice daily.  If his BMP is stable, I will make no additional changes and plan on seeing the patient in 3 months.

## 2019-04-24 MED ORDER — ALBUTEROL SULFATE HFA 108 (90 BASE) MCG/ACT IN AERS: INHALATION_SPRAY | RESPIRATORY_TRACT | 3 refills | Status: DC

## 2019-04-24 MED ORDER — FLUTICASONE PROPIONATE 50 MCG/ACT NA SUSP: 1.0000 | Freq: Every day | NASAL | 3 refills | Status: DC

## 2019-05-22 ENCOUNTER — Other Ambulatory Visit: Payer: Self-pay | Admitting: Family Medicine

## 2019-06-10 ENCOUNTER — Telehealth: Payer: Self-pay | Admitting: Cardiology

## 2019-06-10 NOTE — Telephone Encounter (Signed)
Pt made aware ok for someone to assist him to appt tomorrow as he has vision issues. Pt appreciates our consideration in this matter

## 2019-06-10 NOTE — Telephone Encounter (Signed)
New message   Patient calling to request Austin Gray accompany Austin Gray during 11/24 appointment. Patient states he is unable to see

## 2019-06-11 ENCOUNTER — Encounter: Payer: Self-pay | Admitting: Cardiology

## 2019-06-11 ENCOUNTER — Ambulatory Visit: Payer: Medicare HMO | Admitting: Cardiology

## 2019-06-11 ENCOUNTER — Other Ambulatory Visit: Payer: Self-pay

## 2019-06-11 VITALS — BP 148/78 | HR 100 | Ht 68.0 in | Wt 228.0 lb

## 2019-06-11 DIAGNOSIS — I4821 Permanent atrial fibrillation: Secondary | ICD-10-CM | POA: Diagnosis not present

## 2019-06-11 LAB — CUP PACEART REMOTE DEVICE CHECK
Battery Remaining Longevity: 82 mo
Battery Remaining Percentage: 95.5 %
Battery Voltage: 3.01 V
Brady Statistic RV Percent Paced: 31 %
Date Time Interrogation Session: 20201124020022
Implantable Lead Implant Date: 20200824
Implantable Lead Location: 753860
Implantable Pulse Generator Implant Date: 20200824
Lead Channel Impedance Value: 480 Ohm
Lead Channel Pacing Threshold Amplitude: 0.625 V
Lead Channel Pacing Threshold Pulse Width: 0.5 ms
Lead Channel Sensing Intrinsic Amplitude: 3.9 mV
Lead Channel Setting Pacing Amplitude: 5 V
Lead Channel Setting Pacing Pulse Width: 0.5 ms
Lead Channel Setting Sensing Sensitivity: 2.5 mV
Pulse Gen Model: 1272
Pulse Gen Serial Number: 9140133

## 2019-06-11 MED ORDER — CARVEDILOL 12.5 MG PO TABS: 12.5000 mg | ORAL_TABLET | Freq: Two times a day (BID) | ORAL | 1 refills | Status: DC

## 2019-06-11 NOTE — Addendum Note (Signed)
Addended by: Stanton Kidney on: 06/11/2019 02:35 PM   Modules accepted: Orders

## 2019-06-11 NOTE — Progress Notes (Signed)
Electrophysiology Office Note   Date:  06/11/2019   ID:  Austin Gray., DOB 26-Nov-1931, MRN [id number]  PCP:  Austin Frizzle, MD  Cardiologist:  Austin Gray Primary Electrophysiologist:  Austin Huberty Meredith Leeds, MD    Chief Complaint: Complete heart block   History of Present Illness: Austin Gray. is a 85 y.o. male who is being seen today for the evaluation of complete heart block at the request of Austin Frizzle, MD. Presenting today for electrophysiology evaluation.  He has a history of diastolic heart failure, permanent atrial fibrillation, hypertension, and mitral regurgitation.  He presented to the hospital and had a [place] dual-chamber pacemaker implanted 03/11/2019.  Today, he denies symptoms of palpitations, chest pain, shortness of breath, orthopnea, PND, lower extremity edema, claudication, dizziness, presyncope, syncope, bleeding, or neurologic sequela. The patient is tolerating medications without difficulties.  There is some days that he has shortness of breath, but most days he feels well.  He is able to do most of his daily activities.   Past Medical History:  Diagnosis Date  . Anxiety   . Arthritis    "little bit; left knee"  . Atrial fibrillation (HCC)    Chronic  . Basal cell carcinoma   . Cataracts, bilateral    to have left eye cataract surgery on 03/21/17   . CHF (congestive heart failure), NYHA class II, chronic, diastolic ([place])   . Colon polyps    last colonoscopy 2015 (tubular adenoma)  . Depression   . Diverticulosis   . Gastritis   . GERD (gastroesophageal reflux disease)   . H/O: GI bleed    Recurrent  . Hemorrhoids   . History of blood transfusion   . History of BPH   . History of kidney stones    hx of x 1   . Hypertension   . Pernicious anemia   . PVC (premature ventricular contraction)   . Rectal bleeding 02/15/12  . Vitamin D deficiency    Past Surgical History:  Procedure Laterality Date  . COLONOSCOPY WITH  PROPOFOL N/A 03/17/2017   Procedure: COLONOSCOPY WITH PROPOFOL;  Surgeon: Carol Ada, MD;  Location: [facility];  Service: Endoscopy;  Laterality: N/A;  . [place]; ~ 2002   left; right  . PACEMAKER IMPLANT N/A 03/11/2019   Procedure: PACEMAKER IMPLANT;  Surgeon: Austin Haw, MD;  Location: [facility];  Service: Cardiovascular;  Laterality: N/A;  . TEMPORARY PACEMAKER N/A 03/10/2019   Procedure: TEMPORARY PACEMAKER;  Surgeon: Martinique, Peter M, MD;  Location: [facility];  Service: Cardiovascular;  Laterality: N/A;     Current Outpatient Medications  Medication Sig Dispense Refill  . albuterol (VENTOLIN HFA) 108 (90 Base) MCG/ACT inhaler INHALE 2 PUFFS BY MOUTH EVERY 6 HOURS AS NEEDED FOR WHEEZING OR SHORTNESS OF BREATH 54 g 3  . amLODipine (NORVASC) 10 MG tablet Take 10 mg by mouth daily.    . ferrous sulfate 325 (65 FE) MG tablet Take 325 mg by mouth every other day.     . fluticasone (FLONASE) 50 MCG/ACT nasal spray Place 1 spray into both nostrils daily. 48 g 3  . furosemide (LASIX) 40 MG tablet Take 40 mg by mouth 2 (two) times daily.    Marland Kitchen losartan (COZAAR) 50 MG tablet Take 1 tablet (50 mg total) by mouth daily. 90 tablet 3  . Multiple Vitamin (MULTIVITAMIN) tablet Take 1 tablet by mouth daily.    . multivitamin-lutein (OCUVITE-LUTEIN)  CAPS Take 1 capsule by mouth daily.    . potassium chloride (K-DUR) 10 MEQ tablet TAKE 1 TABLET EVERY DAY 90 tablet 3  . XARELTO 20 MG TABS tablet TAKE 1 TABLET EVERY DAY WITH SUPPER 90 tablet 3   Current Facility-Administered Medications  Medication Dose Route Frequency Provider Last Rate Last Dose  . cyanocobalamin ((VITAMIN B-12)) injection 1,000 mcg  1,000 mcg Intramuscular Q30 days Austin Frizzle, MD   1,000 mcg at 03/22/19 1139    Allergies:   Patient has no known allergies.   Social History:  The patient  reports that he has never smoked. He quit smokeless tobacco use about 16 months ago.  His  smokeless tobacco use included snuff. He reports that he does not drink alcohol or use drugs.   Family History:  The patient's family history includes Cancer in his mother; Stroke (age of onset: 37) in his father.    ROS:  Please see the history of present illness.   Otherwise, review of systems is positive for none.   All other systems are reviewed and negative.    PHYSICAL EXAM: VS:  BP (!) 148/78   Pulse 100   Ht 5\' 8"  (1.727 Gray)   Wt 228 lb (103.4 kg)   SpO2 98%   BMI 34.67 kg/Gray  , BMI Body mass index is 34.67 kg/Gray. GEN: Well nourished, well developed, in no acute distress  HEENT: normal  Neck: no JVD, carotid bruits, or masses Cardiac: irregular; no murmurs, rubs, or gallops,no edema  Respiratory:  clear to auscultation bilaterally, normal work of breathing GI: soft, nontender, nondistended, + BS MS: no deformity or atrophy  Skin: warm and dry, device pocket is well healed Neuro:  Strength and sensation are intact Psych: euthymic mood, full affect  EKG:  EKG is ordered today. Personal review of the ekg ordered shows atrial fibrillation, right bundle branch block, rate 100  Device interrogation is reviewed today in detail.  See PaceArt for details.   Recent Labs: 01/28/2019: ALT 17 03/10/2019: B Natriuretic Peptide 267.6; Magnesium 2.5 03/22/2019: Hemoglobin 11.7; Platelets 139 04/11/2019: BUN 13; Creat 1.10; Potassium 3.9; Sodium 138    Lipid Panel     Component Value Date/Time   CHOL 176 04/08/2016 0823   TRIG 57 04/08/2016 0823   HDL 64 04/08/2016 0823   CHOLHDL 2.8 04/08/2016 0823   VLDL 11 04/08/2016 0823   LDLCALC 101 04/08/2016 0823     Wt Readings from Last 3 Encounters:  06/11/19 228 lb (103.4 kg)  04/11/19 225 lb (102.1 kg)  04/02/19 228 lb (103.4 kg)      Other studies Reviewed: Additional studies/ records that were reviewed today include: TTE 03/12/19  Review of the above records today demonstrates:   1. The left ventricle has normal systolic  function with an ejection fraction of 60-65%. The cavity size was normal. There is mildly increased left ventricular wall thickness. Left ventricular diastolic Doppler parameters are indeterminate. No evidence  of left ventricular regional wall motion abnormalities.  2. The right ventricle has normal systolic function. The cavity was normal. There is no increase in right ventricular wall thickness.  3. Left atrial size was severely dilated.  4. Right atrial size was severely dilated.  5. No evidence of mitral valve stenosis. Mild eccentric regurgitation.  6. The aortic valve is tricuspid. Mild calcification of the aortic valve. No stenosis of the aortic valve.  7. The aorta is normal unless otherwise noted.  8. The aortic root  is normal in size and structure.  9. The inferior vena cava was dilated in size with <50% respiratory variability. PA systolic pressure 60 mmHg.   ASSESSMENT AND PLAN:  1.  Heart block: Status post [employer] dual-chamber pacemaker implanted 03/11/2019.  Device functioning appropriately.  No changes.  2.  Permanent atrial fibrillation: Current anticoagulation with Xarelto.  His heart rates have been fast.  Kriti Katayama start carvedilol.  He Rigdon Macomber be likely using his backup pacemaker.  This patients CHA2DS2-VASc Score and unadjusted Ischemic Stroke Rate (% per year) is equal to 3.2 % stroke rate/year from a score of 3  Above score calculated as 1 point each if present [CHF, HTN, DM, Vascular=MI/PAD/Aortic Plaque, Age if 65-74, or Male] Above score calculated as 2 points each if present [Age > 75, or Stroke/TIA/TE]  3.  Hypertension: Pressure is elevated today.  Due to that, we Manolo Bosket start carvedilol.    Current medicines are reviewed at length with the patient today.   The patient does not have concerns regarding his medicines.  The following changes were made today: Start carvedilol 12.5 mg  Labs/ tests ordered today include:  Orders Placed This Encounter  Procedures   . EKG 12-Lead     Disposition:   FU with Charisma Charlot 9 months  Signed, Osa Campoli Meredith Leeds, MD  06/11/2019 2:24 PM     [facility] [address] [place] [address] [phone] (office) [phone] (fax)

## 2019-06-11 NOTE — Patient Instructions (Addendum)
Medication Instructions:  Your physician has recommended you make the following change in your medication:  1. START Carvedilol 12.5 mg TWICE a day  *If you need a refill on your cardiac medications before your next appointment, please call your pharmacy*  Labwork: None ordered  Testing/Procedures: None ordered  Follow-Up: Remote monitoring is used to monitor your Pacemaker or ICD from home. This monitoring reduces the number of office visits required to check your device to one time per year. It allows [place] to keep an eye on the functioning of your device to ensure it is working properly. You are scheduled for a device check from home on 09/25/2019. You may send your transmission at any time that day. If you have a wireless device, the transmission will be sent automatically. After your physician reviews your transmission, you will receive a postcard with your next transmission date.  At [facility], you and your health needs are our priority.  As part of our continuing mission to provide you with exceptional heart care, we have created designated Provider Care Teams.  These Care Teams include your primary Cardiologist (physician) and Advanced Practice Providers (APPs -  Physician Assistants and Nurse Practitioners) who all work together to provide you with the care you need, when you need it.  You will need a follow up appointment in 9 months.  Please call our office 2 months in advance to schedule this appointment.  You may see Dr Curt Bears or one of the following Advanced Practice Providers on your designated Care Team:    Chanetta Marshall, NP  Tommye Standard, PA-C  Oda Kilts, [place]  Thank you for choosing [facility]!!   Trinidad Curet, RN [phone]  Any Other Special Instructions Will Be Listed Below (If Applicable).  Carvedilol tablets What is this medicine? CARVEDILOL (KAR ve dil ol) is a beta-blocker. Beta-blockers reduce the workload on the heart and help it to beat more  regularly. This medicine is used to treat high blood pressure and heart failure. This medicine may be used for other purposes; ask your health care provider or pharmacist if you have questions. COMMON BRAND NAME(S): Coreg What should I tell my health care provider before I take this medicine? They need to know if you have any of these conditions:  circulation problems  diabetes  history of heart attack or heart disease  liver disease  lung or breathing disease, like asthma or emphysema  pheochromocytoma  slow or irregular heartbeat  thyroid disease  an unusual or allergic reaction to carvedilol, other beta-blockers, medicines, foods, dyes, or preservatives  pregnant or trying to get pregnant  breast-feeding How should I use this medicine? Take this medicine by mouth with a glass of water. Follow the directions on the prescription label. It is best to take the tablets with food. Take your doses at regular intervals. Do not take your medicine more often than directed. Do not stop taking except on the advice of your doctor or health care professional. Talk to your pediatrician regarding the use of this medicine in children. Special care may be needed. Overdosage: If you think you have taken too much of this medicine contact a poison control center or emergency room at once. NOTE: This medicine is only for you. Do not share this medicine with others. What if I miss a dose? If you miss a dose, take it as soon as you can. If it is almost time for your next dose, take only that dose. Do not take double  or extra doses. What may interact with this medicine? This medicine may interact with the following medications:  certain medicines for blood pressure, heart disease, irregular heart beat  certain medicines for depression, like fluoxetine or paroxetine  certain medicines for diabetes, like glipizide or glyburide  cimetidine  clonidine  cyclosporine  digoxin  MAOIs like  Carbex, Eldepryl, Marplan, Nardil, and Parnate  reserpine  rifampin This list may not describe all possible interactions. Give your health care provider a list of all the medicines, herbs, non-prescription drugs, or dietary supplements you use. Also tell them if you smoke, drink alcohol, or use illegal drugs. Some items may interact with your medicine. What should I watch for while using this medicine? Check your heart rate and blood pressure regularly while you are taking this medicine. Ask your doctor or health care professional what your heart rate and blood pressure should be, and when you should contact him or her. Do not stop taking this medicine suddenly. This could lead to serious heart-related effects. Contact your doctor or health care professional if you have difficulty breathing while taking this drug. Check your weight daily. Ask your doctor or health care professional when you should notify him/her of any weight gain. You may get drowsy or dizzy. Do not drive, use machinery, or do anything that requires mental alertness until you know how this medicine affects you. To reduce the risk of dizzy or fainting spells, do not sit or stand up quickly. Alcohol can make you more drowsy, and increase flushing and rapid heartbeats. Avoid alcoholic drinks. This medicine may increase blood sugar. Ask your healthcare provider if changes in diet or medicines are needed if you have diabetes. If you are going to have surgery, tell your doctor or health care professional that you are taking this medicine. What side effects may I notice from receiving this medicine? Side effects that you should report to your doctor or health care professional as soon as possible:  allergic reactions like skin rash, itching or hives, swelling of the face, lips, or tongue  breathing problems  dark urine  irregular heartbeat   signs and symptoms of high blood sugar such as being more thirsty or hungry or having to  urinate more than normal. You may also feel very tired or have blurry vision.  swollen legs or ankles  vomiting  yellowing of the eyes or skin Side effects that usually do not require medical attention (report to your doctor or health care professional if they continue or are bothersome):  change in sex drive or performance  diarrhea  dry eyes (especially if wearing contact lenses)  dry, itching skin  headache  nausea  unusually tired This list may not describe all possible side effects. Call your doctor for medical advice about side effects. You may report side effects to FDA at 1-800-FDA-1088. Where should I keep my medicine? Keep out of the reach of children. Store at room temperature below 30 degrees C (86 degrees F). Protect from moisture. Keep container tightly closed. Throw away any unused medicine after the expiration date. NOTE: This sheet is a summary. It may not cover all possible information. If you have questions about this medicine, talk to your doctor, pharmacist, or health care provider.  2020 Elsevier/Gold Standard (2018-04-25 09:13:57)

## 2019-06-25 ENCOUNTER — Other Ambulatory Visit: Payer: Self-pay

## 2019-06-25 MED ORDER — CARVEDILOL 12.5 MG PO TABS: 12.5000 mg | ORAL_TABLET | Freq: Two times a day (BID) | ORAL | 3 refills | Status: DC

## 2019-06-28 ENCOUNTER — Ambulatory Visit: Payer: Medicare HMO | Admitting: Cardiology

## 2019-07-15 ENCOUNTER — Ambulatory Visit (INDEPENDENT_AMBULATORY_CARE_PROVIDER_SITE_OTHER): Payer: Medicare HMO | Admitting: Family Medicine

## 2019-07-15 ENCOUNTER — Encounter: Payer: Self-pay | Admitting: Family Medicine

## 2019-07-15 ENCOUNTER — Other Ambulatory Visit: Payer: Self-pay

## 2019-07-15 VITALS — BP 124/80 | HR 77 | Temp 98.5°F | Resp 16 | Ht 68.0 in | Wt 224.0 lb

## 2019-07-15 DIAGNOSIS — I5032 Chronic diastolic (congestive) heart failure: Secondary | ICD-10-CM | POA: Diagnosis not present

## 2019-07-15 DIAGNOSIS — I10 Essential (primary) hypertension: Secondary | ICD-10-CM | POA: Diagnosis not present

## 2019-07-15 DIAGNOSIS — I480 Paroxysmal atrial fibrillation: Secondary | ICD-10-CM

## 2019-07-15 MED ORDER — VITAMIN B-12 1000 MCG PO TABS: 1000.0000 ug | ORAL_TABLET | Freq: Every day | ORAL | 3 refills | Status: DC

## 2019-07-15 NOTE — Progress Notes (Signed)
Subjective:    Patient ID: Austin Mesi., male    DOB: 10-05-1931, 85 y.o.   MRN: [id number]  HPI  Patient's granddaughter called me on call recently stating that the patient's heart rate was in the 30s and that he was having trouble breathing.  I recommended they go to the emergency room immediately.  Patient was admitted to the hospital with symptomatic bradycardia due to complete heart block and received a pacemaker.  He is here today for hospital follow-up.  I have copied relevant portions of his discharge summary below for my reference: Admit date: 03/10/2019 Discharge date: 03/14/2019  Diagnostic Studies/Procedures    ECHO: 03/12/2019 1. The left ventricle has normal systolic function with an ejection fraction of 60-65%. The cavity size was normal. There is mildly increased left ventricular wall thickness. Left ventricular diastolic Doppler parameters are indeterminate. No evidence  of left ventricular regional wall motion abnormalities. 2. The right ventricle has normal systolic function. The cavity was normal. There is no increase in right ventricular wall thickness. 3. Left atrial size was severely dilated. 4. Right atrial size was severely dilated. 5. No evidence of mitral valve stenosis. Mild eccentric regurgitation. 6. The aortic valve is tricuspid. Mild calcification of the aortic valve. No stenosis of the aortic valve. 7. The aorta is normal unless otherwise noted. 8. The aortic root is normal in size and structure. 9. The inferior vena cava was dilated in size with <50% respiratory variability. PA systolic pressure 60 mmHg.  PROCEDURES: 03/11/2019 1. Pacemaker implantation.    History of Present Illness     85 yo male w/ hx HTN, permanent Afib, MR, was admitted 08/23 with increasing DOE and bradycardia.   Hospital Course     Consultants: EP   His HR was was in the 30s, complete heart block.  He also had a 17-second pause in the ER which was  symptomatic, with confusion.  He was taken to the lab and a temp wire was placed.  A temp wire rate had to be increased overnight to prevent hypotension.  He was seen by EP, and had a [place] dual lead pacemaker implanted without complication.  He is off AV nodal blocking agents.  His Xarelto was held, but he is to resume that on 8/27.  He had no bleeding issues.  Post-procedure chest x-ray showed no pneumothorax.  He was volume overloaded, and required IV Lasix 80 mg every 8 hours for diuresis.  The acute on chronic diastolic CHF was felt secondary to the bradycardia.  He diuresed almost 18 L and his weight came down 16 pounds.    He had hypokalemia secondary to diuresis and his potassium was supplemented.   His discharge weight is 238 pounds.  He is to resume his home Lasix dosing of 80 mg a.m. and 40 mg p.m.  His blood pressure was elevated at times.  His home amlodipine, cardura and losartan had been held. The losartan was restarted at discharge. Restart amlodipine and/or Cardura at follow-up as indicated 03/22/19  Patient is here today for follow-up.  He states he still feels very weak and tired.  He has very little stamina.  His blood pressure at home has been between 104 and 141/58-83.  However he resumed his Cardura and amlodipine after discharge by accident.  He denies any shortness of breath however he has +2 pitting edema in his right leg and +2 pitting edema in his left leg up to his knee.  He appears  fluid overloaded on today's exam.  His heart rate is stable in the 70s.  Pulse oximetry is between 92 and 97% at home.  They are monitoring that with a pulse ox at home.  He denies any chest pain.  He denies any dysuria.  He denies any altered mental status or confusion.  He does report constipation.  At that time, my plan was: Spent 25 minutes today with the patient discussing his medical condition.  Recommended that he discontinue amlodipine and Cardura.  Monitor blood pressure  closely.  However I believe he needs increased diuresis based on his clinical exam.  Therefore I will increase his Lasix to 80 mg twice daily and recheck the patient next week.  Obtain a baseline BMP today and monitor his hemoglobin given his recent surgery.  Monitor his potassium and renal function next week at follow-up and also watch his peripheral edema closely.  Patient received his flu shot today.  03/26/19 No visits with results within 1 Week(s) from this visit.  Latest known visit with results is:  Appointment on 06/11/2019  Component Date Value Ref Range Status  . Date Time Interrogation Session 06/11/2019 [id number]   Final  . Pulse Generator Manufacturer 06/11/2019 S[facility]   Final  . Pulse Gen Model 06/11/2019 1272 Assurity MRI   Final  . Pulse Gen Serial Number 06/11/2019 [id number]   Final  . Clinic Name 06/11/2019 [facility]   Final  . Implantable Pulse Generator Type 06/11/2019 Implantable Pulse Generator   Final  . Implantable Pulse Generator Implan* 06/11/2019 [id number]   Final  . Implantable Lead Manufacturer 06/11/2019 S[facility]CR   Final  . Implantable Lead Model 06/11/2019 [employer] MRI [id number]   Final  . Implantable Lead Serial Number 06/11/2019 [id number]   Final  . Implantable Lead Implant Date 06/11/2019 [id number]   Final  . Implantable Lead Location Detail 1 06/11/2019 UNKNOWN   Final  . Implantable Lead Location 06/11/2019 [id number]   Final  . Lead Channel Setting Sensing Sensi* 06/11/2019 2.5  mV Final  . Lead Channel Setting Sensing Adapt* 06/11/2019 Fixed Pacing   Final  . Lead Channel Setting Pacing Pulse * 06/11/2019 0.5  ms Final  . Lead Channel Setting Pacing Amplit* 06/11/2019 5.0  V Final  . Lead Channel Status 06/11/2019 NULL   Final  . Lead Channel Impedance Value 06/11/2019 480  ohm Final  . Lead Channel Sensing Intrinsic Amp* 06/11/2019 3.9  mV Final  . Lead Channel Pacing Threshold Ampl* 06/11/2019 0.625  V Final  . Lead Channel Pacing Threshold Puls* 06/11/2019  0.5  ms Final  . Battery Status 06/11/2019 MOS   Final  . Battery Remaining Longevity 06/11/2019 82  mo Final  . Battery Remaining Percentage 06/11/2019 95.5  % Final  . Battery Voltage 06/11/2019 3.01  V Final  . Brady Statistic RV Percent Paced 06/11/2019 31.0  % Final   Patient has lost 7 pounds since his appointment last week.  Last week he was 242 pounds.  Today he is 235!Marland Kitchen  He looks much better.  Although he discontinued his blood pressure medicines at his last appointment, his blood pressure remains well controlled today.  He is still taking losartan.  He denies any chest pain.  He denies any shortness of breath.  He denies any orthopnea.  Today he is in normal sinus rhythm.  His lungs are clear to auscultation with no evidence of pulmonary edema.  He still has trace bipedal edema but much better than last week.  At that time, my plan was: Patient appears euvolemic today and seems to be doing better on the higher dose of Lasix.  His blood pressure remains well controlled despite stopping his Cardura and amlodipine.  He is still taking losartan.  I will recheck renal function today.  As long as his renal function is stable I will continue the patient on the higher dose of Lasix 80 mg twice daily and then reassess the patient in 1 week.  If he maintains stability in his weight, blood pressure, and renal function, we will maintain this patient long-term on Lasix 80 mg twice daily.  However if he does show evidence of prerenal azotemia today on his lab work, we will need to reduce his Lasix dose.  04/02/19 Patient presents today with elevated blood pressure.  He states that he saw his cardiologist last week and they reduced his Lasix dose to 80 mg in the morning and 40 mg in the evening.  He continues to have very little swelling.  However he states that he just started the lower dose today.  However he is concerned because his blood pressure has been steadily increasing as his strength is improved.   His blood pressure today is up to 172/100.  He continue to use losartan however he has been holding doxazosin and amlodipine as I previously directed.  He denies any chest pain shortness of breath or dyspnea on exertion.  At that time, my plan was:  Patient's edema has resolved.  We will need to monitor him closely to see if his edema returns on the lower dose of Lasix.  He will continue 80 mg in the morning and 40 mg in the evening at the present time and notify me via telephone in 1 week of how he is doing.  Resume amlodipine 10 mg a day for hypertension and recheck blood pressure in 1 week.  If still elevated we will resume his doxazosin at that point.  04/11/19 Patient is here today to recheck his blood pressure.  His blood pressure here is much better at 136/80.  He states that he feels better.  His physical therapist has released him.  He is getting stronger and is able to ambulate now using a cane.  He has lost 3 additional pounds since I saw him.  There is no evidence of fluid overload on exam.  He denies any dizziness or lightheadedness or dry mouth but I am concerned that we may be over diuresing the patient now.  He appears euvolemic today on exam.  He is still taking 80 mg of Lasix in the morning and 40 mg in the evening.  He is on 10 mEq of potassium every morning.  He denies any chest pain, shortness of breath, dyspnea on exertion.  At that time, my plan was: Patient appears to be at his baseline.  He is doing well.  His strength is improving.  His blood pressure is excellent.  My only concern is the additional weight loss may be a sign of overdiuresis.  Therefore I will check a BMP.  If his BMP shows an elevation in his creatinine, I would reduce his Lasix to 40 mg twice daily.  If his BMP is stable, I will make no additional changes and plan on seeing the patient in 3 months.  07/15/19 Patient presents today for regular checkup.  He denies any chest pain.  He denies any shortness of breath.   He denies any dyspnea on exertion.  He  does report feeling slightly tired.  He attributes this to starting carvedilol 12.5 mg twice daily.  He states that ever since he started that he has been feeling more tired however he is not bradycardic today on exam.  Heart rate is in the 70s and regular.  Due to his pacemaker, the patient should not experience bradycardia.  Roughly around the same time he stopped getting B12 injections due to concern about coming to the office during the Covid pandemic.  Is possible that B12 deficiency is causing him to feel tired.  He states that he has not had a B12 injection in over 3 months.  He is taking a multivitamin with this only has 25 mcg of B12 in it.  On exam today he is in normal sinus rhythm.  There is no evidence of pulmonary edema.  There is no evidence of elevated JVD.  He has no pitting edema in his extremities and his blood pressures well controlled at 124/80. Past Medical History:  Diagnosis Date  . Anxiety   . Arthritis    "little bit; left knee"  . Atrial fibrillation (HCC)    Chronic  . Basal cell carcinoma   . Cataracts, bilateral    to have left eye cataract surgery on 03/21/17   . CHF (congestive heart failure), NYHA class II, chronic, diastolic ([place])   . Colon polyps    last colonoscopy 2015 (tubular adenoma)  . Depression   . Diverticulosis   . Gastritis   . GERD (gastroesophageal reflux disease)   . H/O: GI bleed    Recurrent  . Hemorrhoids   . History of blood transfusion   . History of BPH   . History of kidney stones    hx of x 1   . Hypertension   . Pernicious anemia   . PVC (premature ventricular contraction)   . Rectal bleeding 02/15/12  . Vitamin D deficiency    Past Surgical History:  Procedure Laterality Date  . COLONOSCOPY WITH PROPOFOL N/A 03/17/2017   Procedure: COLONOSCOPY WITH PROPOFOL;  Surgeon: Carol Ada, MD;  Location: [facility];  Service: Endoscopy;  Laterality: N/A;  . [place]; ~ 2002     left; right  . PACEMAKER IMPLANT N/A 03/11/2019   Procedure: PACEMAKER IMPLANT;  Surgeon: Constance Haw, MD;  Location: [place] [facility];  Service: Cardiovascular;  Laterality: N/A;  . TEMPORARY PACEMAKER N/A 03/10/2019   Procedure: TEMPORARY PACEMAKER;  Surgeon: Martinique, Peter M, MD;  Location: [place] [facility];  Service: Cardiovascular;  Laterality: N/A;   Current Outpatient Medications on File Prior to Visit  Medication Sig Dispense Refill  . albuterol (VENTOLIN HFA) 108 (90 Base) MCG/ACT inhaler INHALE 2 PUFFS BY MOUTH EVERY 6 HOURS AS NEEDED FOR WHEEZING OR SHORTNESS OF BREATH 54 g 3  . amLODipine (NORVASC) 10 MG tablet Take 10 mg by mouth daily.    . carvedilol (COREG) 12.5 MG tablet Take 1 tablet (12.5 mg total) by mouth 2 (two) times daily. 90 tablet 3  . ferrous sulfate 325 (65 FE) MG tablet Take 325 mg by mouth every other day.     . fluticasone (FLONASE) 50 MCG/ACT nasal spray Place 1 spray into both nostrils daily. 48 g 3  . furosemide (LASIX) 40 MG tablet Take 40 mg by mouth 2 (two) times daily.    Marland Kitchen losartan (COZAAR) 50 MG tablet Take 1 tablet (50 mg total) by mouth daily. 90 tablet 3  . Multiple Vitamin (  MULTIVITAMIN) tablet Take 1 tablet by mouth daily.    . multivitamin-lutein (OCUVITE-LUTEIN) CAPS Take 1 capsule by mouth daily.    . potassium chloride (K-DUR) 10 MEQ tablet TAKE 1 TABLET EVERY DAY 90 tablet 3  . XARELTO 20 MG TABS tablet TAKE 1 TABLET EVERY DAY WITH SUPPER 90 tablet 3   Current Facility-Administered Medications on File Prior to Visit  Medication Dose Route Frequency Provider Last Rate Last Admin  . cyanocobalamin ((VITAMIN B-12)) injection 1,000 mcg  1,000 mcg Intramuscular Q30 days [name], MD   1,000 mcg at 03/22/19 1139   No Known Allergies Social History   Socioeconomic History  . Marital status: Widowed    Spouse name: Not on file  . Number of children: 1  . Years of education: Not on file  . Highest education level: Not on  file  Occupational History  . Occupation: Retired  Tobacco Use  . Smoking status: Never Smoker  . Smokeless tobacco: Former Systems developer    Types: Snuff  . Tobacco comment: 02/15/12 "still use snuff @ times; not daily"  Substance and Sexual Activity  . Alcohol use: No  . Drug use: No  . Sexual activity: Never    Comment: widower, lives with granddaughter and great grandchildren.  Other Topics Concern  . Not on file  Social History Narrative   Lives with grand daughter.         Epworth Sleepiness Scale = 7 (as of 09/21/2015)   Social Determinants of Health   Financial Resource Strain:   . Difficulty of Paying Living Expenses: Not on file  Food Insecurity:   . Worried About Charity fundraiser in the Last Year: Not on file  . Ran Out of Food in the Last Year: Not on file  Transportation Needs:   . Lack of Transportation (Medical): Not on file  . Lack of Transportation (Non-Medical): Not on file  Physical Activity:   . Days of Exercise per Week: Not on file  . Minutes of Exercise per Session: Not on file  Stress:   . Feeling of Stress : Not on file  Social Connections:   . Frequency of Communication with Friends and Family: Not on file  . Frequency of Social Gatherings with Friends and Family: Not on file  . Attends Religious Services: Not on file  . Active Member of Clubs or Organizations: Not on file  . Attends Archivist Meetings: Not on file  . Marital Status: Not on file  Intimate Partner Violence:   . Fear of Current or Ex-Partner: Not on file  . Emotionally Abused: Not on file  . Physically Abused: Not on file  . Sexually Abused: Not on file     Review of Systems  All other systems reviewed and are negative.      Objective:   Physical Exam Vitals reviewed.  Constitutional:      General: He is not in acute distress.    Appearance: Normal appearance. He is normal weight. He is not ill-appearing, toxic-appearing or diaphoretic.  Cardiovascular:     Rate  and Rhythm: Normal rate and regular rhythm.     Heart sounds: Normal heart sounds.  Pulmonary:     Effort: Pulmonary effort is normal.     Breath sounds: Normal breath sounds. No wheezing, rhonchi or rales.  Abdominal:     General: Abdomen is flat.     Palpations: Abdomen is soft.  Musculoskeletal:     Right lower leg:  No edema.     Left lower leg: No edema.  Skin:    Coloration: Skin is not jaundiced.  Neurological:     Mental Status: He is alert.           Assessment & Plan:  Benign essential HTN - Plan: CBC with Differential, COMPLETE METABOLIC PANEL WITH GFR  Chronic diastolic heart failure ([place]) - Plan: CBC with Differential, COMPLETE METABOLIC PANEL WITH GFR  Paroxysmal atrial fibrillation (HCC) - Plan: CBC with Differential, COMPLETE METABOLIC PANEL WITH GFR  I question if the patient's fatigue could be due to vitamin B-12 deficiency given the fact he has not had any injection in more than 3 months.  Therefore I will send the patient a B12 oral supplement 1000 mcg daily.  I explained to the patient that if he takes this daily, this would be relatively equivalent to the B12 injection and would be much safer he can do this at home.  His blood pressure is well controlled as is his heart rate.  Check CBC to monitor for anemia as he has a history of anemia.  Check a CMP to monitor his electrolytes.  There is no evidence of atrial fibrillation today on his exam and the patient is not fluid overloaded.  Continue current regimen.

## 2019-07-16 LAB — CBC WITH DIFFERENTIAL/PLATELET
Absolute Monocytes: 651 cells/uL (ref 200–950)
Basophils Absolute: 49 cells/uL (ref 0–200)
Basophils Relative: 0.7 %
Eosinophils Absolute: 119 cells/uL (ref 15–500)
Eosinophils Relative: 1.7 %
HCT: 42.6 % (ref 38.5–50.0)
Hemoglobin: 14.1 g/dL (ref 13.2–17.1)
Lymphs Abs: 1498 cells/uL (ref 850–3900)
MCH: 28.8 pg (ref 27.0–33.0)
MCHC: 33.1 g/dL (ref 32.0–36.0)
MCV: 87.1 fL (ref 80.0–100.0)
MPV: 12.7 fL — ABNORMAL HIGH (ref 7.5–12.5)
Monocytes Relative: 9.3 %
Neutro Abs: 4683 cells/uL (ref 1500–7800)
Neutrophils Relative %: 66.9 %
Platelets: 135 10*3/uL — ABNORMAL LOW (ref 140–400)
RBC: 4.89 10*6/uL (ref 4.20–5.80)
RDW: 13.6 % (ref 11.0–15.0)
Total Lymphocyte: 21.4 %
WBC: 7 10*3/uL (ref 3.8–10.8)

## 2019-07-16 LAB — COMPLETE METABOLIC PANEL WITH GFR
AG Ratio: 1.6 (calc) (ref 1.0–2.5)
ALT: 23 U/L (ref 9–46)
AST: 33 U/L (ref 10–35)
Albumin: 4.1 g/dL (ref 3.6–5.1)
Alkaline phosphatase (APISO): 80 U/L (ref 35–144)
BUN: 11 mg/dL (ref 7–25)
CO2: 30 mmol/L (ref 20–32)
Calcium: 9.4 mg/dL (ref 8.6–10.3)
Chloride: 102 mmol/L (ref 98–110)
Creat: 0.81 mg/dL (ref 0.70–1.11)
GFR, Est African American: 94 mL/min/{1.73_m2} (ref >=60)
GFR, Est Non African American: 81 mL/min/{1.73_m2} (ref >=60)
Globulin: 2.6 g/dL (calc) (ref 1.9–3.7)
Glucose, Bld: 109 mg/dL — ABNORMAL HIGH (ref 65–99)
Potassium: 3.9 mmol/L (ref 3.5–5.3)
Sodium: 141 mmol/L (ref 135–146)
Total Bilirubin: 1.8 mg/dL — ABNORMAL HIGH (ref 0.2–1.2)
Total Protein: 6.7 g/dL (ref 6.1–8.1)

## 2019-07-30 ENCOUNTER — Encounter (INDEPENDENT_AMBULATORY_CARE_PROVIDER_SITE_OTHER): Payer: Medicare HMO | Admitting: Ophthalmology

## 2019-08-06 ENCOUNTER — Ambulatory Visit: Payer: Medicare HMO | Admitting: Cardiology

## 2019-08-12 ENCOUNTER — Other Ambulatory Visit: Payer: Self-pay | Admitting: Cardiology

## 2019-09-23 ENCOUNTER — Other Ambulatory Visit: Payer: Self-pay | Admitting: Family Medicine

## 2019-09-25 ENCOUNTER — Ambulatory Visit (INDEPENDENT_AMBULATORY_CARE_PROVIDER_SITE_OTHER): Payer: Medicare HMO | Admitting: *Deleted

## 2019-09-25 DIAGNOSIS — Z950 Presence of cardiac pacemaker: Secondary | ICD-10-CM | POA: Diagnosis not present

## 2019-09-25 LAB — CUP PACEART REMOTE DEVICE CHECK
Battery Remaining Longevity: 113 mo
Battery Remaining Percentage: 95.5 %
Battery Voltage: 3.02 V
Brady Statistic RV Percent Paced: 76 %
Date Time Interrogation Session: 20210310020012
Implantable Lead Implant Date: 20200824
Implantable Lead Location: 753860
Implantable Pulse Generator Implant Date: 20200824
Lead Channel Impedance Value: 510 Ohm
Lead Channel Pacing Threshold Amplitude: 1 V
Lead Channel Pacing Threshold Pulse Width: 0.5 ms
Lead Channel Sensing Intrinsic Amplitude: 7.5 mV
Lead Channel Setting Pacing Amplitude: 2.5 V
Lead Channel Setting Pacing Pulse Width: 0.5 ms
Lead Channel Setting Sensing Sensitivity: 0.7 mV
Pulse Gen Model: 1272
Pulse Gen Serial Number: 9140133

## 2019-09-25 NOTE — Progress Notes (Signed)
PPM Remote  

## 2019-09-26 ENCOUNTER — Ambulatory Visit: Payer: Medicare HMO | Admitting: Cardiology

## 2019-10-23 ENCOUNTER — Telehealth: Payer: Self-pay | Admitting: Cardiology

## 2019-10-23 NOTE — Telephone Encounter (Signed)
Spoke with the patient who states he needs his friend,Teresa to accompany him to his appointment because he does not see very well and she helps him get around. Advised pt that I would make a note so that she can attend.

## 2019-10-23 NOTE — Telephone Encounter (Signed)
  Austin Gray wants his friend Helene Kelp to come with him to his appointment on 10/28/19 with Dr Percival Spanish. He says he does not see very well and she will need to help him get around.

## 2019-10-25 ENCOUNTER — Other Ambulatory Visit: Payer: Self-pay | Admitting: Family Medicine

## 2019-10-27 ENCOUNTER — Encounter: Payer: Self-pay | Admitting: Cardiology

## 2019-10-27 DIAGNOSIS — I4821 Permanent atrial fibrillation: Secondary | ICD-10-CM | POA: Insufficient documentation

## 2019-10-27 DIAGNOSIS — Z950 Presence of cardiac pacemaker: Secondary | ICD-10-CM | POA: Insufficient documentation

## 2019-10-27 DIAGNOSIS — Z7189 Other specified counseling: Secondary | ICD-10-CM | POA: Insufficient documentation

## 2019-10-27 NOTE — Progress Notes (Signed)
Cardiology Office Note   Date:  10/28/2019   ID:  Austin Mesi., DOB 09/12/1931, MRN [id number]  PCP:  [name], MD  Cardiologist:   Minus Breeding, MD   Chief Complaint  Patient presents with  . Atrial Fibrillation      History of Present Illness: Austin Tomaso. is a 86 y.o. male who presents for evaluation of atrial fib and acute on chronic systolic and diastolic HF.  He was admitted on 09/05/2017 after he presented to his PCPs office with bradycardia and symptoms of volume overload.  Initial lab work also suggested acute kidney injury with creatinine elevation up to 1.5.  His heart rate was in the 30s in the ER.  His beta-blocker was held during the admission.  His ACE inhibitor was discontinued.  Echocardiogram obtained on 09/06/2017 showed mild LVH, EF 55-60%, moderate MR, moderate TR, PA peak pressure 36 mmHg.  He had normalization of the renal function by 09/15/2017.  He had CHB and a permanent pacemaker in August of last year.   He returns for follow up.   He walks with a cane.  He says occasionally he gets pain in his lumbar back.  He has to sit down.  He might get chest discomfort when that happens.  However, he does not ever take nitroglycerin.  He does not have shortness of breath, PND or orthopnea.  He does not have any palpitations, presyncope or syncope.  He has no weight gain or edema above and beyond his baseline.  Past Medical History:  Diagnosis Date  . Anxiety   . Arthritis    "little bit; left knee"  . Atrial fibrillation (HCC)    Chronic  . Basal cell carcinoma   . Cataracts, bilateral    to have left eye cataract surgery on 03/21/17   . CHF (congestive heart failure), NYHA class II, chronic, diastolic ([place])   . Colon polyps    last colonoscopy 2015 (tubular adenoma)  . Depression   . Diverticulosis   . Gastritis   . GERD (gastroesophageal reflux disease)   . H/O: GI bleed    Recurrent  . Hemorrhoids   . History of BPH   . History of  kidney stones    hx of x 1   . Hypertension   . Pernicious anemia   . PVC (premature ventricular contraction)   . Rectal bleeding 02/15/12  . Vitamin D deficiency     Past Surgical History:  Procedure Laterality Date  . COLONOSCOPY WITH PROPOFOL N/A 03/17/2017   Procedure: COLONOSCOPY WITH PROPOFOL;  Surgeon: Carol Ada, MD;  Location: [facility];  Service: Endoscopy;  Laterality: N/A;  . [place]; ~ 2002   left; right  . PACEMAKER IMPLANT N/A 03/11/2019   Procedure: PACEMAKER IMPLANT;  Surgeon: Constance Haw, MD;  Location: [place] [facility];  Service: Cardiovascular;  Laterality: N/A;  . TEMPORARY PACEMAKER N/A 03/10/2019   Procedure: TEMPORARY PACEMAKER;  Surgeon: Martinique, Peter M, MD;  Location: [place] [facility];  Service: Cardiovascular;  Laterality: N/A;     Current Outpatient Medications  Medication Sig Dispense Refill  . albuterol (VENTOLIN HFA) 108 (90 Base) MCG/ACT inhaler INHALE 2 PUFFS BY MOUTH EVERY 6 HOURS AS NEEDED FOR WHEEZING OR SHORTNESS OF BREATH 54 g 3  . amLODipine (NORVASC) 10 MG tablet TAKE 1 TABLET (10 MG TOTAL) BY MOUTH DAILY. 90 tablet 3  . carvedilol (COREG) 12.5 MG tablet Take 1  tablet (12.5 mg total) by mouth 2 (two) times daily. 90 tablet 3  . ferrous sulfate 325 (65 FE) MG tablet Take 325 mg by mouth every other day.     . fluticasone (FLONASE) 50 MCG/ACT nasal spray Place 1 spray into both nostrils daily. 48 g 3  . furosemide (LASIX) 40 MG tablet Take 40 mg by mouth 2 (two) times daily.    Marland Kitchen losartan (COZAAR) 50 MG tablet Take 1 tablet (50 mg total) by mouth daily. 90 tablet 3  . Multiple Vitamin (MULTIVITAMIN) tablet Take 1 tablet by mouth daily.    . multivitamin-lutein (OCUVITE-LUTEIN) CAPS Take 1 capsule by mouth daily.    . potassium chloride (K-DUR) 10 MEQ tablet TAKE 1 TABLET EVERY DAY 90 tablet 3  . potassium chloride (KLOR-CON) 10 MEQ tablet TAKE 1 TABLET EVERY DAY 90 tablet 3  . vitamin B-12 (CYANOCOBALAMIN)  1000 MCG tablet Take 1 tablet (1,000 mcg total) by mouth daily. 90 tablet 3  . XARELTO 20 MG TABS tablet TAKE 1 TABLET EVERY DAY WITH SUPPER 90 tablet 3   Current Facility-Administered Medications  Medication Dose Route Frequency Provider Last Rate Last Admin  . cyanocobalamin ((VITAMIN B-12)) injection 1,000 mcg  1,000 mcg Intramuscular Q30 days [name], MD   1,000 mcg at 03/22/19 1139    Allergies:   Patient has no known allergies.    ROS:  Please see the history of present illness.   Otherwise, review of systems are positive for none.   All other systems are reviewed and negative.    PHYSICAL EXAM: VS:  BP (!) 156/90   Pulse 83   Ht 5\' 10"  (1.778 m)   Wt 230 lb (104.3 kg)   BMI 33.00 kg/m  , BMI Body mass index is 33 kg/m.  GENERAL:  Well appearing NECK:  No jugular venous distention, waveform within normal limits, carotid upstroke brisk and symmetric, no bruits, no thyromegaly LUNGS:  Clear to auscultation bilaterally CHEST:  Unremarkable HEART:  PMI not displaced or sustained,S1 and S2 within normal limits, no S3, no S4, no clicks, no rubs, no murmurs ABD:  Flat, positive bowel sounds normal in frequency in pitch, no bruits, no rebound, no guarding, no midline pulsatile mass, no hepatomegaly, no splenomegaly EXT:  2 plus pulses throughout, no edema, no cyanosis no clubbing   EKG:  EKG is ordered today. Atrial fibrillation, right bundle branch block, no acute ST-T wave changes.  Occasional paced beats  Recent Labs: 03/10/2019: B Natriuretic Peptide 267.6; Magnesium 2.5 07/15/2019: ALT 23; BUN 11; Creat 0.81; Hemoglobin 14.1; Platelets 135; Potassium 3.9; Sodium 141    Lipid Panel    Component Value Date/Time   CHOL 176 04/08/2016 0823   TRIG 57 04/08/2016 0823   HDL 64 04/08/2016 0823   CHOLHDL 2.8 04/08/2016 0823   VLDL 11 04/08/2016 0823   LDLCALC 101 04/08/2016 0823      Wt Readings from Last 3 Encounters:  10/28/19 230 lb (104.3 kg)  07/15/19 224  lb (101.6 kg)  06/11/19 228 lb (103.4 kg)      Other studies Reviewed: Additional studies/ records that were reviewed today include:  Labs Review of the above records demonstrates:     ASSESSMENT AND PLAN:  ATRIAL FIB:    CHA2DS2-VASc Score and unadjusted Ischemic Stroke Rate (% per year) is equal to 3.2 % stroke rate/year from a score of 3.  He tolerates anticoagulation.  No change in therapy.   MR:   This  was mild last year.   No further imaging.   CHB/PERMANENT PACEMAKER:   The patient is up-to-date with pacemaker interrogation.  EDEMA: He has very mild leg swelling.  OBESITY:   The patient understands the need to lose weight with diet and exercise  HTN:   The blood pressure is at target.  No change in therapy.  CHRONIC DIASTOLIC HF:     He seems to be euvolemic.  No change in therapy.   COVID EDUCATION:   He has had one shot of his vaccine   Current medicines are reviewed at length with the patient today.  The patient does not have concerns regarding medicines.  The following changes have been made:  None   Labs/ tests ordered today include:  None   Orders Placed This Encounter  Procedures  . EKG 12-Lead     Disposition:   FU with me in all months.    Signed, Minus Breeding, MD  10/28/2019 11:27 AM    [facility]

## 2019-10-28 ENCOUNTER — Ambulatory Visit: Payer: Medicare HMO | Admitting: Cardiology

## 2019-10-28 ENCOUNTER — Other Ambulatory Visit: Payer: Self-pay

## 2019-10-28 ENCOUNTER — Encounter: Payer: Self-pay | Admitting: Cardiology

## 2019-10-28 VITALS — BP 156/90 | HR 83 | Ht 70.0 in | Wt 230.0 lb

## 2019-10-28 DIAGNOSIS — M7989 Other specified soft tissue disorders: Secondary | ICD-10-CM | POA: Diagnosis not present

## 2019-10-28 DIAGNOSIS — I5032 Chronic diastolic (congestive) heart failure: Secondary | ICD-10-CM

## 2019-10-28 DIAGNOSIS — Z950 Presence of cardiac pacemaker: Secondary | ICD-10-CM

## 2019-10-28 DIAGNOSIS — I10 Essential (primary) hypertension: Secondary | ICD-10-CM | POA: Diagnosis not present

## 2019-10-28 DIAGNOSIS — I4821 Permanent atrial fibrillation: Secondary | ICD-10-CM

## 2019-10-28 DIAGNOSIS — Z7189 Other specified counseling: Secondary | ICD-10-CM

## 2019-10-28 DIAGNOSIS — I110 Hypertensive heart disease with heart failure: Secondary | ICD-10-CM | POA: Diagnosis not present

## 2019-10-28 NOTE — Patient Instructions (Signed)
Medication Instructions:  No changes *If you need a refill on your cardiac medications before your next appointment, please call your pharmacy*  Lab Work: None ordered this visit  Testing/Procedures: None ordered this visit  Follow-Up: At [facility], you and your health needs are our priority.  As part of our continuing mission to provide you with exceptional heart care, we have created designated Provider Care Teams.  These Care Teams include your primary Cardiologist (physician) and Advanced Practice Providers (APPs -  Physician Assistants and Nurse Practitioners) who all work together to provide you with the care you need, when you need it.  We recommend signing up for the patient portal called "MyChart".  Sign up information is provided on this After Visit Summary.  MyChart is used to connect with patients for Virtual Visits (Telemedicine).  Patients are able to view lab/test results, encounter notes, upcoming appointments, etc.  Non-urgent messages can be sent to your provider as well.   To learn more about what you can do with MyChart, go to [email or web address].    Your next appointment:   6 month(s)  You will receive a reminder letter in the mail two months in advance. If you don't receive a letter, please call our office to schedule the follow-up appointment.  The format for your next appointment:   In Person  Provider:   Minus Breeding, MD

## 2019-10-30 ENCOUNTER — Other Ambulatory Visit: Payer: Self-pay | Admitting: Cardiology

## 2019-12-04 ENCOUNTER — Other Ambulatory Visit: Payer: Self-pay | Admitting: Family Medicine

## 2019-12-19 ENCOUNTER — Other Ambulatory Visit: Payer: Self-pay

## 2019-12-19 ENCOUNTER — Encounter: Payer: Self-pay | Admitting: Family Medicine

## 2019-12-19 ENCOUNTER — Ambulatory Visit (INDEPENDENT_AMBULATORY_CARE_PROVIDER_SITE_OTHER): Payer: Medicare HMO | Admitting: Family Medicine

## 2019-12-19 VITALS — BP 130/80 | HR 90 | Temp 98.2°F | Ht 68.0 in | Wt 222.0 lb

## 2019-12-19 DIAGNOSIS — I442 Atrioventricular block, complete: Secondary | ICD-10-CM

## 2019-12-19 DIAGNOSIS — I10 Essential (primary) hypertension: Secondary | ICD-10-CM

## 2019-12-19 DIAGNOSIS — I5032 Chronic diastolic (congestive) heart failure: Secondary | ICD-10-CM | POA: Diagnosis not present

## 2019-12-19 DIAGNOSIS — I480 Paroxysmal atrial fibrillation: Secondary | ICD-10-CM

## 2019-12-19 NOTE — Progress Notes (Signed)
Subjective:    Patient ID: Austin Mesi., male    DOB: Nov 14, 1931, 86 y.o.   MRN: [id number]  HPI  Patient is a very pleasant 86 year old Caucasian male here today for checkup.  He has a history of paroxysmal atrial fibrillation.  He also has a history of complete AV block requiring a pacemaker due to symptomatic bradycardia.  In the past he has been in a paced rhythm when I have seen him.  However today he is in atrial fibrillation.  His heart rate is 90 bpm.  He denies any palpitations or tachycardia or shortness of breath.  He denies any syncope or near syncope.  He denies any recent fainting episodes or feeling weak.  He denies any dyspnea on exertion, orthopnea, or paroxysmal nocturnal dyspnea.  Today he is wearing compression hose and his edema in his legs is only trace.  His lungs are completely clear to auscultation with no evidence of pulmonary edema.  He does not appear fluid overloaded on exam.  He also has a history of lower GI bleed and anemia secondary to that.  Obviously we are monitoring this closely due to his use of Xarelto for primary prevention of stroke.  He is compliant taking Xarelto.  He denies any melena or hematochezia.  He denies any epistaxis or bruising.  Overall has been doing well with no concerns.  He had his Covid vaccination in April.  Past Medical History:  Diagnosis Date  . Anxiety   . Arthritis    "little bit; left knee"  . Atrial fibrillation (HCC)    Chronic  . Basal cell carcinoma   . Cataracts, bilateral    to have left eye cataract surgery on 03/21/17   . CHF (congestive heart failure), NYHA class II, chronic, diastolic ([place])   . Colon polyps    last colonoscopy 2015 (tubular adenoma)  . Depression   . Diverticulosis   . Gastritis   . GERD (gastroesophageal reflux disease)   . H/O: GI bleed    Recurrent  . Hemorrhoids   . History of BPH   . History of kidney stones    hx of x 1   . Hypertension   . Pernicious anemia   . PVC (premature  ventricular contraction)   . Rectal bleeding 02/15/12  . Vitamin D deficiency    Past Surgical History:  Procedure Laterality Date  . COLONOSCOPY WITH PROPOFOL N/A 03/17/2017   Procedure: COLONOSCOPY WITH PROPOFOL;  Surgeon: Carol Ada, MD;  Location: [facility];  Service: Endoscopy;  Laterality: N/A;  . [place]; ~ 2002   left; right  . PACEMAKER IMPLANT N/A 03/11/2019   Procedure: PACEMAKER IMPLANT;  Surgeon: Constance Haw, MD;  Location: [place] [facility];  Service: Cardiovascular;  Laterality: N/A;  . TEMPORARY PACEMAKER N/A 03/10/2019   Procedure: TEMPORARY PACEMAKER;  Surgeon: Martinique, Peter M, MD;  Location: [place] [facility];  Service: Cardiovascular;  Laterality: N/A;   Current Outpatient Medications on File Prior to Visit  Medication Sig Dispense Refill  . albuterol (VENTOLIN HFA) 108 (90 Base) MCG/ACT inhaler INHALE 2 PUFFS BY MOUTH EVERY 6 HOURS AS NEEDED FOR WHEEZING OR SHORTNESS OF BREATH 54 g 3  . amLODipine (NORVASC) 10 MG tablet TAKE 1 TABLET (10 MG TOTAL) BY MOUTH DAILY. 90 tablet 3  . carvedilol (COREG) 12.5 MG tablet TAKE 1 TABLET TWICE DAILY 180 tablet 2  . ferrous sulfate 325 (65 FE) MG tablet Take 325 mg  by mouth every other day.     . fluticasone (FLONASE) 50 MCG/ACT nasal spray Place 1 spray into both nostrils daily. 48 g 3  . furosemide (LASIX) 40 MG tablet TAKE 2 TABLETS (80MG ) EVERY MORNING AND 1 TABLET (40MG ) EVERY EVENING 270 tablet 3  . losartan (COZAAR) 50 MG tablet Take 1 tablet (50 mg total) by mouth daily. 90 tablet 3  . Multiple Vitamin (MULTIVITAMIN) tablet Take 1 tablet by mouth daily.    . multivitamin-lutein (OCUVITE-LUTEIN) CAPS Take 1 capsule by mouth daily.    . potassium chloride (KLOR-CON) 10 MEQ tablet TAKE 1 TABLET EVERY DAY 90 tablet 3  . vitamin B-12 (CYANOCOBALAMIN) 1000 MCG tablet Take 1 tablet (1,000 mcg total) by mouth daily. 90 tablet 3  . XARELTO 20 MG TABS tablet TAKE 1 TABLET EVERY DAY WITH SUPPER 90  tablet 3   Current Facility-Administered Medications on File Prior to Visit  Medication Dose Route Frequency Provider Last Rate Last Admin  . cyanocobalamin ((VITAMIN B-12)) injection 1,000 mcg  1,000 mcg Intramuscular Q30 days [name], MD   1,000 mcg at 03/22/19 1139   No Known Allergies Social History   Socioeconomic History  . Marital status: Widowed    Spouse name: Not on file  . Number of children: 1  . Years of education: Not on file  . Highest education level: Not on file  Occupational History  . Occupation: Retired  Tobacco Use  . Smoking status: Never Smoker  . Smokeless tobacco: Former Systems developer    Types: Snuff  . Tobacco comment: 02/15/12 "still use snuff @ times; not daily"  Substance and Sexual Activity  . Alcohol use: No  . Drug use: No  . Sexual activity: Never    Comment: widower, lives with granddaughter and great grandchildren.  Other Topics Concern  . Not on file  Social History Narrative   Lives with grand daughter.         Epworth Sleepiness Scale = 7 (as of 09/21/2015)   Social Determinants of Health   Financial Resource Strain:   . Difficulty of Paying Living Expenses:   Food Insecurity:   . Worried About Charity fundraiser in the Last Year:   . Arboriculturist in the Last Year:   Transportation Needs:   . Film/video editor (Medical):   Marland Kitchen Lack of Transportation (Non-Medical):   Physical Activity:   . Days of Exercise per Week:   . Minutes of Exercise per Session:   Stress:   . Feeling of Stress :   Social Connections:   . Frequency of Communication with Friends and Family:   . Frequency of Social Gatherings with Friends and Family:   . Attends Religious Services:   . Active Member of Clubs or Organizations:   . Attends Archivist Meetings:   Marland Kitchen Marital Status:   Intimate Partner Violence:   . Fear of Current or Ex-Partner:   . Emotionally Abused:   Marland Kitchen Physically Abused:   . Sexually Abused:      Review of Systems   All other systems reviewed and are negative.      Objective:   Physical Exam Vitals reviewed.  Constitutional:      General: He is not in acute distress.    Appearance: Normal appearance. He is normal weight. He is not ill-appearing, toxic-appearing or diaphoretic.  Cardiovascular:     Rate and Rhythm: Normal rate. Rhythm irregular.     Heart sounds: Normal  heart sounds.  Pulmonary:     Effort: Pulmonary effort is normal.     Breath sounds: Normal breath sounds. No wheezing, rhonchi or rales.  Abdominal:     General: Abdomen is flat. Bowel sounds are normal. There is no distension.     Palpations: Abdomen is soft.     Tenderness: There is no abdominal tenderness. There is no guarding.  Musculoskeletal:     Right lower leg: No edema.     Left lower leg: No edema.  Skin:    Coloration: Skin is not jaundiced.  Neurological:     Mental Status: He is alert.           Assessment & Plan:  Heart block AV complete (HCC)  Chronic diastolic heart failure (HCC) - Plan: CBC with Differential/Platelet, COMPLETE METABOLIC PANEL WITH GFR  Paroxysmal atrial fibrillation ([place]) - Plan: CBC with Differential/Platelet, COMPLETE METABOLIC PANEL WITH GFR  Benign essential HTN - Plan: CBC with Differential/Platelet, COMPLETE METABOLIC PANEL WITH GFR  Patient appears to be doing well.  Today he is in atrial fibrillation however he also has a pacemaker to prevent bradycardia due to his history of complete AV block.  He denies any syncope or signs of bradycardia.  He also has a history of chronic diastolic heart failure however there is no evidence of pulmonary edema on exam today for peripheral edema.  Therefore his dose of furosemide appears to be sufficient.  His blood pressure is well controlled at 130/80.  He does have a history of lower GI bleed and is on chronic anticoagulation for stroke prevention.  He denies any evidence of bleeding however I will check a CBC to monitor for any occult  blood loss.  Overall the patient appears to be doing quite well.  He is totally asymptomatic.  Congratulated the patient on getting his Covid vaccination.

## 2019-12-20 LAB — CBC WITH DIFFERENTIAL/PLATELET
Absolute Monocytes: 710 cells/uL (ref 200–950)
Basophils Absolute: 59 cells/uL (ref 0–200)
Basophils Relative: 0.8 %
Eosinophils Absolute: 81 cells/uL (ref 15–500)
Eosinophils Relative: 1.1 %
HCT: 45.1 % (ref 38.5–50.0)
Hemoglobin: 14.8 g/dL (ref 13.2–17.1)
Lymphs Abs: 1680 cells/uL (ref 850–3900)
MCH: 28.8 pg (ref 27.0–33.0)
MCHC: 32.8 g/dL (ref 32.0–36.0)
MCV: 87.9 fL (ref 80.0–100.0)
MPV: 11.8 fL (ref 7.5–12.5)
Monocytes Relative: 9.6 %
Neutro Abs: 4869 cells/uL (ref 1500–7800)
Neutrophils Relative %: 65.8 %
Platelets: 148 10*3/uL (ref 140–400)
RBC: 5.13 10*6/uL (ref 4.20–5.80)
RDW: 13.8 % (ref 11.0–15.0)
Total Lymphocyte: 22.7 %
WBC: 7.4 10*3/uL (ref 3.8–10.8)

## 2019-12-20 LAB — COMPLETE METABOLIC PANEL WITH GFR
AG Ratio: 1.7 (calc) (ref 1.0–2.5)
ALT: 21 U/L (ref 9–46)
AST: 31 U/L (ref 10–35)
Albumin: 4.2 g/dL (ref 3.6–5.1)
Alkaline phosphatase (APISO): 73 U/L (ref 35–144)
BUN: 14 mg/dL (ref 7–25)
CO2: 27 mmol/L (ref 20–32)
Calcium: 9.6 mg/dL (ref 8.6–10.3)
Chloride: 103 mmol/L (ref 98–110)
Creat: 0.95 mg/dL (ref 0.70–1.11)
GFR, Est African American: 84 mL/min/{1.73_m2} (ref >=60)
GFR, Est Non African American: 73 mL/min/{1.73_m2} (ref >=60)
Globulin: 2.5 g/dL (calc) (ref 1.9–3.7)
Glucose, Bld: 127 mg/dL — ABNORMAL HIGH (ref 65–99)
Potassium: 3.7 mmol/L (ref 3.5–5.3)
Sodium: 140 mmol/L (ref 135–146)
Total Bilirubin: 1.6 mg/dL — ABNORMAL HIGH (ref 0.2–1.2)
Total Protein: 6.7 g/dL (ref 6.1–8.1)

## 2019-12-25 ENCOUNTER — Ambulatory Visit (INDEPENDENT_AMBULATORY_CARE_PROVIDER_SITE_OTHER): Payer: Medicare HMO | Admitting: *Deleted

## 2019-12-25 ENCOUNTER — Telehealth: Payer: Self-pay | Admitting: Family Medicine

## 2019-12-25 DIAGNOSIS — I442 Atrioventricular block, complete: Secondary | ICD-10-CM | POA: Diagnosis not present

## 2019-12-25 LAB — CUP PACEART REMOTE DEVICE CHECK
Battery Remaining Longevity: 112 mo
Battery Remaining Percentage: 95.5 %
Battery Voltage: 3.01 V
Brady Statistic RV Percent Paced: 76 %
Date Time Interrogation Session: 20210609020012
Implantable Lead Implant Date: 20200824
Implantable Lead Location: 753860
Implantable Pulse Generator Implant Date: 20200824
Lead Channel Impedance Value: 530 Ohm
Lead Channel Pacing Threshold Amplitude: 1 V
Lead Channel Pacing Threshold Pulse Width: 0.5 ms
Lead Channel Sensing Intrinsic Amplitude: 2.3 mV
Lead Channel Setting Pacing Amplitude: 2.5 V
Lead Channel Setting Pacing Pulse Width: 0.5 ms
Lead Channel Setting Sensing Sensitivity: 0.7 mV
Pulse Gen Model: 1272
Pulse Gen Serial Number: 9140133

## 2019-12-25 NOTE — Progress Notes (Signed)
  Chronic Care Management   Outreach Note  12/25/2019 Name: Austin Gray. MRN: [id number] DOB: [birth year]  Referred by: [name], MD Reason for referral : No chief complaint on file.   A second unsuccessful telephone outreach was attempted today. The patient was referred to pharmacist for assistance with care management and care coordination.  Follow Up Plan:   [place]

## 2019-12-26 NOTE — Progress Notes (Signed)
Remote pacemaker transmission.   

## 2020-01-06 ENCOUNTER — Other Ambulatory Visit: Payer: Self-pay | Admitting: Family Medicine

## 2020-01-06 ENCOUNTER — Other Ambulatory Visit: Payer: Self-pay | Admitting: Cardiology

## 2020-01-06 DIAGNOSIS — R0600 Dyspnea, unspecified: Secondary | ICD-10-CM

## 2020-01-10 ENCOUNTER — Telehealth: Payer: Self-pay | Admitting: Family Medicine

## 2020-01-10 NOTE — Progress Notes (Signed)
  Chronic Care Management   Note  01/10/2020 Name: Austin Gray. MRN: [id number] DOB: 1932/01/11  Austin Gray. is a 86 y.o. year old male who is a primary care patient of [name], MD. I reached out to [employer]. by phone today in response to a referral sent by [name]'s PCP, [name], MD.   Austin Gray was given information about Chronic Care Management services today including:  1. CCM service includes personalized support from designated clinical staff supervised by his physician, including individualized plan of care and coordination with other care providers 2. 24/7 contact phone numbers for assistance for urgent and routine care needs. 3. Service will only be billed when office clinical staff spend 20 minutes or more in a month to coordinate care. 4. Only one practitioner may furnish and bill the service in a calendar month. 5. The patient may stop CCM services at any time (effective at the end of the month) by phone call to the office staff.   Patient agreed to services and verbal consent obtained.   Follow up plan:   [place]

## 2020-01-18 ENCOUNTER — Other Ambulatory Visit: Payer: Self-pay | Admitting: Family Medicine

## 2020-03-03 ENCOUNTER — Other Ambulatory Visit: Payer: Self-pay

## 2020-03-03 ENCOUNTER — Ambulatory Visit: Payer: Medicare HMO | Admitting: Cardiology

## 2020-03-03 ENCOUNTER — Encounter: Payer: Self-pay | Admitting: Cardiology

## 2020-03-03 VITALS — BP 140/74 | HR 70 | Ht 68.0 in | Wt 229.6 lb

## 2020-03-03 DIAGNOSIS — I442 Atrioventricular block, complete: Secondary | ICD-10-CM

## 2020-03-03 NOTE — Patient Instructions (Signed)
Medication Instructions:  Your physician recommends that you continue on your current medications as directed. Please refer to the Current Medication list given to you today.  *If you need a refill on your cardiac medications before your next appointment, please call your pharmacy*   Lab Work: None ordered If you have labs (blood work) drawn today and your tests are completely normal, you will receive your results only by: Marland Kitchen MyChart Message (if you have MyChart) OR . A paper copy in the mail If you have any lab test that is abnormal or we need to change your treatment, we will call you to review the results.   Testing/Procedures: None ordered   Follow-Up: Remote monitoring is used to monitor your Pacemaker of ICD from home. This monitoring reduces the number of office visits required to check your device to one time per year. It allows [place] to keep an eye on the functioning of your device to ensure it is working properly. You are scheduled for a device check from home on 03/25/2020. You may send your transmission at any time that day. If you have a wireless device, the transmission will be sent automatically. After your physician reviews your transmission, you will receive a postcard with your next transmission date.  At [facility], you and your health needs are our priority.  As part of our continuing mission to provide you with exceptional heart care, we have created designated Provider Care Teams.  These Care Teams include your primary Cardiologist (physician) and Advanced Practice Providers (APPs -  Physician Assistants and Nurse Practitioners) who all work together to provide you with the care you need, when you need it.  We recommend signing up for the patient portal called "MyChart".  Sign up information is provided on this After Visit Summary.  MyChart is used to connect with patients for Virtual Visits (Telemedicine).  Patients are able to view lab/test results, encounter notes,  upcoming appointments, etc.  Non-urgent messages can be sent to your provider as well.   To learn more about what you can do with MyChart, go to [email or web address].    Your next appointment:   1 year(s)  The format for your next appointment:   In Person  Provider:   Allegra Lai, MD   Thank you for choosing [facility]!!   Trinidad Curet, RN [phone]    Other Instructions

## 2020-03-03 NOTE — Progress Notes (Signed)
Electrophysiology Office Note   Date:  03/03/2020   ID:  Austin Mesi., DOB [birth year], MRN [id number]  PCP:  [name], MD  Cardiologist:  Hochrein Primary Electrophysiologist:  Birdia Jaycox Meredith Leeds, MD    Chief Complaint: Complete heart block   History of Present Illness: Austin Gray. is a 86 y.o. male who is being seen today for the evaluation of complete heart block at the request of [name], MD. Presenting today for electrophysiology evaluation.  He has a history of diastolic heart failure, permanent atrial fibrillation, hypertension, and mitral regurgitation.  He presented to the hospital and had a [place] dual-chamber pacemaker implanted 03/11/2019.  Today, denies symptoms of palpitations, chest pain, shortness of breath, orthopnea, PND, lower extremity edema, claudication, dizziness, presyncope, syncope, bleeding, or neurologic sequela. The patient is tolerating medications without difficulties.  He has been doing well.  He has no chest pain or shortness of breath.  He is becoming less steady on his feet, but otherwise gets around well.   Past Medical History:  Diagnosis Date  . Anxiety   . Arthritis    "little bit; left knee"  . Atrial fibrillation (HCC)    Chronic  . Basal cell carcinoma   . Cataracts, bilateral    to have left eye cataract surgery on 03/21/17   . CHF (congestive heart failure), NYHA class II, chronic, diastolic ([place])   . Colon polyps    last colonoscopy 2015 (tubular adenoma)  . Depression   . Diverticulosis   . Gastritis   . GERD (gastroesophageal reflux disease)   . H/O: GI bleed    Recurrent  . Hemorrhoids   . History of BPH   . History of kidney stones    hx of x 1   . Hypertension   . Pernicious anemia   . PVC (premature ventricular contraction)   . Rectal bleeding 02/15/12  . Vitamin D deficiency    Past Surgical History:  Procedure Laterality Date  . COLONOSCOPY WITH PROPOFOL N/A 03/17/2017    Procedure: COLONOSCOPY WITH PROPOFOL;  Surgeon: Carol Ada, MD;  Location: [facility];  Service: Endoscopy;  Laterality: N/A;  . [place]; ~ 2002   left; right  . PACEMAKER IMPLANT N/A 03/11/2019   Procedure: PACEMAKER IMPLANT;  Surgeon: Constance Haw, MD;  Location: [facility];  Service: Cardiovascular;  Laterality: N/A;  . TEMPORARY PACEMAKER N/A 03/10/2019   Procedure: TEMPORARY PACEMAKER;  Surgeon: Martinique, Peter M, MD;  Location: [facility];  Service: Cardiovascular;  Laterality: N/A;     Current Outpatient Medications  Medication Sig Dispense Refill  . albuterol (VENTOLIN HFA) 108 (90 Base) MCG/ACT inhaler INHALE 2 PUFFS EVERY 6 HOURS AS NEEDED FOR WHEEZING OR SHORTNESS OF BREATH 18 g 3  . amLODipine (NORVASC) 10 MG tablet TAKE 1 TABLET (10 MG TOTAL) BY MOUTH DAILY. 90 tablet 3  . carvedilol (COREG) 12.5 MG tablet TAKE 1 TABLET TWICE DAILY 180 tablet 2  . ferrous sulfate 325 (65 FE) MG tablet Take 325 mg by mouth every other day.     . fluticasone (FLONASE) 50 MCG/ACT nasal spray Place 1 spray into both nostrils daily. 48 g 3  . furosemide (LASIX) 40 MG tablet Take 40 mg by mouth 2 (two) times daily.    Marland Kitchen losartan (COZAAR) 50 MG tablet TAKE 1 TABLET EVERY DAY 90 tablet 3  . Multiple Vitamin (MULTIVITAMIN) tablet Take 1 tablet by mouth  daily.    . multivitamin-lutein (OCUVITE-LUTEIN) CAPS Take 1 capsule by mouth daily.    . potassium chloride (KLOR-CON) 10 MEQ tablet TAKE 1 TABLET EVERY DAY 90 tablet 3  . vitamin B-12 (CYANOCOBALAMIN) 1000 MCG tablet Take 1 tablet (1,000 mcg total) by mouth daily. 90 tablet 3  . XARELTO 20 MG TABS tablet TAKE 1 TABLET EVERY DAY WITH SUPPER 90 tablet 3   Current Facility-Administered Medications  Medication Dose Route Frequency Provider Last Rate Last Admin  . cyanocobalamin ((VITAMIN B-12)) injection 1,000 mcg  1,000 mcg Intramuscular Q30 days [name], MD   1,000 mcg at 03/22/19 1139     Allergies:   Patient has no known allergies.   Social History:  The patient  reports that he has never smoked. He quit smokeless tobacco use about 2 years ago.  His smokeless tobacco use included snuff. He reports that he does not drink alcohol and does not use drugs.   Family History:  The patient's family history includes Cancer in his mother; Stroke (age of onset: 34) in his father.    ROS:  Please see the history of present illness.   Otherwise, review of systems is positive for none.   All other systems are reviewed and negative.   PHYSICAL EXAM: VS:  BP 140/74   Pulse 70   Ht 5\' 8"  (1.727 m)   Wt 229 lb 9.6 oz (104.1 kg)   SpO2 96%   BMI 34.91 kg/m  , BMI Body mass index is 34.91 kg/m. GEN: Well nourished, well developed, in no acute distress  HEENT: normal  Neck: no JVD, carotid bruits, or masses Cardiac: RRR; 2-6 systolic murmur at the base, no rubs, or gallops,no edema  Respiratory:  clear to auscultation bilaterally, normal work of breathing GI: soft, nontender, nondistended, + BS MS: no deformity or atrophy  Skin: warm and dry, device site well healed Neuro:  Strength and sensation are intact Psych: euthymic mood, full affect  EKG:  EKG is ordered today. Personal review of the ekg ordered shows atrial fibrillation, ventricular paced  Personal review of the device interrogation today. Results in [place]: 03/10/2019: B Natriuretic Peptide 267.6; Magnesium 2.5 12/19/2019: ALT 21; BUN 14; Creat 0.95; Hemoglobin 14.8; Platelets 148; Potassium 3.7; Sodium 140    Lipid Panel     Component Value Date/Time   CHOL 176 04/08/2016 0823   TRIG 57 04/08/2016 0823   HDL 64 04/08/2016 0823   CHOLHDL 2.8 04/08/2016 0823   VLDL 11 04/08/2016 0823   LDLCALC 101 04/08/2016 0823     Wt Readings from Last 3 Encounters:  03/03/20 229 lb 9.6 oz (104.1 kg)  12/19/19 222 lb (100.7 kg)  10/28/19 230 lb (104.3 kg)      Other studies Reviewed: Additional  studies/ records that were reviewed today include: TTE 03/12/19  Review of the above records today demonstrates:   1. The left ventricle has normal systolic function with an ejection fraction of 60-65%. The cavity size was normal. There is mildly increased left ventricular wall thickness. Left ventricular diastolic Doppler parameters are indeterminate. No evidence  of left ventricular regional wall motion abnormalities.  2. The right ventricle has normal systolic function. The cavity was normal. There is no increase in right ventricular wall thickness.  3. Left atrial size was severely dilated.  4. Right atrial size was severely dilated.  5. No evidence of mitral valve stenosis. Mild eccentric regurgitation.  6. The aortic valve  is tricuspid. Mild calcification of the aortic valve. No stenosis of the aortic valve.  7. The aorta is normal unless otherwise noted.  8. The aortic root is normal in size and structure.  9. The inferior vena cava was dilated in size with <50% respiratory variability. PA systolic pressure 60 mmHg.   ASSESSMENT AND PLAN:  1.  Complete heart block: Status post [employer] dual-chamber pacemaker implanted 03/11/2019.  Device functioning appropriately.  No changes.    2.  Permanent atrial fibrillation: Current is on carvedilol for rate control.  CHA2DS2-VASc of 3.    3.  Hypertension: Mildly elevated today.  Has been well controlled in the past.  No changes.    Current medicines are reviewed at length with the patient today.   The patient does not have concerns regarding his medicines.  The following changes were made today: None  Labs/ tests ordered today include:  Orders Placed This Encounter  Procedures  . EKG 12-Lead     Disposition:   FU with Idan Prime 12 months  Signed, Abrial Arrighi Meredith Leeds, MD  03/03/2020 2:55 PM     [facility] [address] [place] [phone] (office) [phone] (fax)

## 2020-03-04 NOTE — Chronic Care Management (AMB) (Addendum)
Chronic Care Management Pharmacy  Name: Austin Gray.  MRN: [id number] DOB: 07/18/1932  Chief Complaint/ HPI  Austin Gray.,  86 y.o. , male presents for their Initial CCM visit with the clinical pharmacist In office.  PCP : [name], MD  Their chronic conditions include: hypertension, Afib, CHF, dyslipidemia.   Office Visits: 12/19/2019 Dennard Schaumann) -  Does have history of GI bleed, on Xarelto primary prenvention of stroke Patient doing well overall, asymptomatic  Consult Visit: 03/03/2020 ([place], Cardio) - eval of complete heart block No medication changes CHA2DS2-VASc score 3  10/28/2019 (Hochrein) -  No medication changes  Medications: Outpatient Encounter Medications as of 03/05/2020  Medication Sig   albuterol (VENTOLIN HFA) 108 (90 Base) MCG/ACT inhaler INHALE 2 PUFFS EVERY 6 HOURS AS NEEDED FOR WHEEZING OR SHORTNESS OF BREATH   amLODipine (NORVASC) 10 MG tablet TAKE 1 TABLET (10 MG TOTAL) BY MOUTH DAILY.   carvedilol (COREG) 12.5 MG tablet TAKE 1 TABLET TWICE DAILY   ferrous sulfate 325 (65 FE) MG tablet Take 325 mg by mouth every other day.    fluticasone (FLONASE) 50 MCG/ACT nasal spray Place 1 spray into both nostrils daily.   furosemide (LASIX) 40 MG tablet Take 40 mg by mouth 2 (two) times daily.   losartan (COZAAR) 50 MG tablet TAKE 1 TABLET EVERY DAY   Multiple Vitamin (MULTIVITAMIN) tablet Take 1 tablet by mouth daily.   multivitamin-lutein (OCUVITE-LUTEIN) CAPS Take 1 capsule by mouth daily.   potassium chloride (KLOR-CON) 10 MEQ tablet TAKE 1 TABLET EVERY DAY   vitamin B-12 (CYANOCOBALAMIN) 1000 MCG tablet Take 1 tablet (1,000 mcg total) by mouth daily.   XARELTO 20 MG TABS tablet TAKE 1 TABLET EVERY DAY WITH SUPPER   Facility-Administered Encounter Medications as of 03/05/2020  Medication   cyanocobalamin ((VITAMIN B-12)) injection 1,000 mcg     Current Diagnosis/Assessment:   Emergency planning/management officer Strain: Low Risk     Difficulty of Paying Living Expenses: Not very hard    Goals Addressed             This Visit's Progress    Pharmacy Care Plan:       CARE PLAN ENTRY (see longitudinal plan of care for additional care plan information)  Current Barriers:  Chronic Disease Management support, education, and care coordination needs related to Hypertension, Hyperlipidemia, Atrial Fibrillation, and Heart Failure   Hypertension BP Readings from Last 3 Encounters:  03/03/20 140/74  12/19/19 130/80  10/28/19 (!) 156/90   Pharmacist Clinical Goal(s): Over the next 180 days, patient will work with PharmD and providers to maintain BP goal <130/80 Current regimen:  Amlodipine 57m Carvedilol 12.535mbid Losartan 5078mnterventions: Reviewed home blood pressure monitoring Comprehensive medication review Patient self care activities - Over the next 180 days, patient will: Check BP daily, document, and provide at future appointments Ensure daily salt intake < 2300 mg/day Contact providers with consistent readings > 130/80.  Hyperlipidemia Lab Results  Component Value Date/Time   [facility] 04/08/2016 08:23 AM   Pharmacist Clinical Goal(s): Over the next 180 days, patient will work with PharmD and providers to achieve LDL goal < 100 Current regimen:  None Interventions: Reviewed most recent lipid panel Recommended updated lipid panel Patient self care activities - Over the next 180 days, patient will: Report to PCP for updated lab work Work to continue dietary modifications to bring LDL to goal.  HF Pharmacist Clinical Goal(s) Over the next 180 days, patient will work with  PharmD and providers to optimize medication related to HF. Current regimen:  Furosemide 41m Carvedilol 12.560mtwice daily Interventions: Discussed current fluid status Counseled on timing of furosemide dosing. Patient self care activities - Over the next 180 days, patient will: Continue to monitor for excessive fluid  overload Contact providers if you gain more than 3 lbs. In one day on 5 lbs in one week. Afib Pharmacist Clinical Goal(s) Over the next 180 days, patient will work with PharmD and providers to optimize medication and minimize symptoms of Afib. Current regimen:  Xarelto 204maily Carvedilol 12.5mg24mice daily Interventions: Counseled on signs of abnormal bruising and bleeding Patient self care activities - Over the next 180 days, patient will: Continue to take medication as directed Contact providers with any signs of abnormal bleeding  Initial goal documentation         AFIB   Patient is currently rate controlled. HR 71 BPM  Patient has failed these meds in past:  Patient is currently controlled on the following medications:  Carvedilol 12.5mg 88mce daily Xarelto 20mg 96my  Patient reports adherence with both medications.  Reports HR has been controlled lately.  No issues with cost on Xarelto.    Plan  Continue current medications  ,  Heart Failure   Type: Diastolic  Patient has failed these meds in past: none noted Patient is currently controlled on the following medications: Furosemide 40mg C57mdilol 12.5 mg bid  Reports swelling is minimal.  Now on 40mg bi18m lasix which controls his swelling.  Monitoring himself daily for fluid overload.  Wears compression stockings, denies SOB unless he over exerts.  Plan  Continue current medications  Hypertension   Office blood pressures are  BP Readings from Last 3 Encounters:  03/03/20 140/74  12/19/19 130/80  10/28/19 (!) 156/90    Patient has failed these meds in the past: none noted  Patient checks BP at home daily  Patient home BP readings are ranging: 120-135/75-85 Patient is currently controlled on the following medications: Amlodipine 10mg Car21mlol 12.5mg bid L81mrtan 50mg  He i33mnitoring his blood pressure daily.  Compliant to all medications.  Denies swelling, dizziness, headaches.     Plan  Continue current medications   Hyperlipidemia   LDL goal < 70  Lipid Panel     Component Value Date/Time   CHOL 176 04/08/2016 0823   TRIG 57 04/08/2016 0823   HDL 64 04/08/2016 0823   LDLCALC 101 04/08/2016 0823    Hepatic Function Latest Ref Rng & Units 12/19/2019 07/15/2019 01/28/2019  Total Protein 6.1 - 8.1 g/dL 6.7 6.7 6.4  Albumin 3.5 - 5.0 g/dL - - -  AST 10 - 35 U/L 31 33 25  ALT 9 - 46 U/L 21 23 17   Alk Phosphatase 38 - 126 U/L - - -  Total Bilirubin 0.2 - 1.2 mg/dL 1.6(H) 1.8(H) 1.7(H)  Bilirubin, Direct 0.0 - 0.2 mg/dL - - -     The ASCVD Risk score (Goff DC JrMikey Bussing al., 2013) failed to calculate for the following reasons:   The 2013 ASCVD risk score is only valid for ages 40 to 79   16tien22has failed these meds in past: none noted Patient is currently uncontrolled on the following medications:  None  Currently on no medication for statin.  Last lipid panel was 2017, patient is due for updated lipids to determine level of control.  Plan  Recommend repeat lipid panel at next CPE.  Vaccines   Reviewed and  discussed patient's vaccination history.    Immunization History  Administered Date(s) Administered   Fluad Quad(high Dose 65+) 03/22/2019   Influenza Split 05/09/2012   Influenza Whole 04/17/2009   Influenza, High Dose Seasonal PF 04/24/2018   Influenza,inj,Quad PF,6+ Mos 03/22/2013, 03/27/2014, 04/06/2015, 04/07/2016, 03/31/2017   PFIZER SARS-COV-2 Vaccination 10/18/2019, 11/09/2019   Pneumococcal Conjugate-13 12/09/2014   Pneumococcal Polysaccharide-23 04/17/2000   Td 06/17/2002   Tdap 06/29/2015   Plan Recommended patient receive shingles vaccine in office. Medication Management   Miscellaneous medications:  Albuterol HFA 72mg Fluticasone 539m prn Potassium chloride 1055mdaily OTC's: Ferrous Sulfate 325m68mtamin b12 1000mc85mtient currently uses [facility].  Phone #  [phone] reports using  vials in bags method to organize medications and promote adherence. Patient denies missed doses of medication.    ChrisBeverly MilchrmD Clinical Pharmacist [place])[phone] collaborated with the care management provider regarding care management and care coordination activities outlined in this encounter and have reviewed this encounter including documentation in the note and care plan. I am certifying that I agree with the content of this note and encounter as supervising physician.

## 2020-03-05 ENCOUNTER — Other Ambulatory Visit: Payer: Self-pay | Admitting: *Deleted

## 2020-03-05 ENCOUNTER — Other Ambulatory Visit: Payer: Self-pay

## 2020-03-05 ENCOUNTER — Ambulatory Visit: Payer: Medicare HMO

## 2020-03-05 DIAGNOSIS — I442 Atrioventricular block, complete: Secondary | ICD-10-CM

## 2020-03-05 DIAGNOSIS — E785 Hyperlipidemia, unspecified: Secondary | ICD-10-CM

## 2020-03-05 DIAGNOSIS — I480 Paroxysmal atrial fibrillation: Secondary | ICD-10-CM

## 2020-03-05 DIAGNOSIS — I5032 Chronic diastolic (congestive) heart failure: Secondary | ICD-10-CM

## 2020-03-05 DIAGNOSIS — R001 Bradycardia, unspecified: Secondary | ICD-10-CM

## 2020-03-05 DIAGNOSIS — I4819 Other persistent atrial fibrillation: Secondary | ICD-10-CM

## 2020-03-05 DIAGNOSIS — Z87438 Personal history of other diseases of male genital organs: Secondary | ICD-10-CM

## 2020-03-05 DIAGNOSIS — E538 Deficiency of other specified B group vitamins: Secondary | ICD-10-CM

## 2020-03-05 DIAGNOSIS — I10 Essential (primary) hypertension: Secondary | ICD-10-CM

## 2020-03-05 NOTE — Patient Instructions (Addendum)
Visit Information Thank you for meeting with me today!  I look forward to working with you to help you meet all of your healthcare goals and answer any questions you may have.  Feel free to contact me anytime!   Goals Addressed            This Visit's Progress   . Pharmacy Care Plan:       CARE PLAN ENTRY (see longitudinal plan of care for additional care plan information)  Current Barriers:  . Chronic Disease Management support, education, and care coordination needs related to Hypertension, Hyperlipidemia, Atrial Fibrillation, and Heart Failure   Hypertension BP Readings from Last 3 Encounters:  03/03/20 140/74  12/19/19 130/80  10/28/19 (!) 156/90   . Pharmacist Clinical Goal(s): o Over the next 180 days, patient will work with PharmD and providers to maintain BP goal <130/80 . Current regimen:   Amlodipine 10mg   Carvedilol 12.5mg  bid  Losartan 50mg  . Interventions: o Reviewed home blood pressure monitoring o Comprehensive medication review . Patient self care activities - Over the next 180 days, patient will: o Check BP daily, document, and provide at future appointments o Ensure daily salt intake < 2300 mg/day o Contact providers with consistent readings > 130/80.  Hyperlipidemia Lab Results  Component Value Date/Time   LDLCALC 101 04/08/2016 08:23 AM   . Pharmacist Clinical Goal(s): o Over the next 180 days, patient will work with PharmD and providers to achieve LDL goal < 100 . Current regimen:  o None . Interventions: o Reviewed most recent lipid panel o Recommended updated lipid panel . Patient self care activities - Over the next 180 days, patient will: o Report to PCP for updated lab work o Work to continue dietary modifications to bring LDL to goal.  HF . Pharmacist Clinical Goal(s) o Over the next 180 days, patient will work with PharmD and providers to optimize medication related to HF. . Current regimen:  o Furosemide 40mg  o Carvedilol  12.5mg  twice daily . Interventions: o Discussed current fluid status o Counseled on timing of furosemide dosing. . Patient self care activities - Over the next 180 days, patient will: o Continue to monitor for excessive fluid overload o Contact providers if you gain more than 3 lbs. In one day on 5 lbs in one week. Afib . Pharmacist Clinical Goal(s) o Over the next 180 days, patient will work with PharmD and providers to optimize medication and minimize symptoms of Afib. . Current regimen:  o Xarelto 20mg  daily o Carvedilol 12.5mg  twice daily . Interventions: o Counseled on signs of abnormal bruising and bleeding . Patient self care activities - Over the next 180 days, patient will: o Continue to take medication as directed o Contact providers with any signs of abnormal bleeding  Initial goal documentation        Mr. Strebeck was given information about Chronic Care Management services today including:  1. CCM service includes personalized support from designated clinical staff supervised by his physician, including individualized plan of care and coordination with other care providers 2. 24/7 contact phone numbers for assistance for urgent and routine care needs. 3. Standard insurance, coinsurance, copays and deductibles apply for chronic care management only during months in which we provide at least 20 minutes of these services. Most insurances cover these services at 100%, however patients may be responsible for any copay, coinsurance and/or deductible if applicable. This service may help you avoid the need for more expensive face-to-face services. 4. Only one  practitioner may furnish and bill the service in a calendar month. 5. The patient may stop CCM services at any time (effective at the end of the month) by phone call to the office staff.  Patient agreed to services and verbal consent obtained.   The patient verbalized understanding of instructions provided today and agreed  to receive a mailed copy of patient instruction and/or educational materials. Telephone follow up appointment with pharmacy team member scheduled for: 6 months  Beverly Milch, PharmD Clinical Pharmacist [facility] [phone]   Heart Failure, Self Care Heart failure is a serious condition. This document explains the things you need to do to take care of yourself after a heart failure diagnosis. You may be asked to change your diet, take certain medicines, and make other lifestyle changes in order to stay as healthy as possible. Your health care provider may also give you more specific instructions. If you have problems or questions, contact your health care provider. What are the risks? Having heart failure puts you at higher risk for certain problems. These problems can get worse if you do not take good care of yourself. Problems may include:  Blood clotting problems. This may cause a stroke.  Damage to the kidneys, liver, or lungs.  Abnormal heart rhythms. Supplies needed:  Scale for monitoring weight.  Blood pressure monitor.  Notebook.  Medicines. How to care for yourself when you have heart failure Medicines Take over-the-counter and prescription medicines only as told by your health care provider. Medicines reduce the workload of your heart, slow the progression of heart failure, and improve symptoms. Take your medicines every day.  Do not stop taking your medicine unless your health care provider tells you to do so.  Do not skip any dose of medicine.  Refill your prescriptions before you run out of medicine. Eating and drinking   Eat heart-healthy foods. Talk with a dietitian to make an eating plan that is right for you. ? Choose foods that contain no trans fat and are low in saturated fat and cholesterol. Healthy choices include fresh or frozen fruits and vegetables, fish, lean meats, legumes, fat-free or low-fat dairy products, and whole-grain  or high-fiber foods. ? Limit salt (sodium) if told by your health care provider. Sodium restriction may reduce symptoms of heart failure. Ask a dietitian to recommend heart-healthy seasonings. ? Use healthy cooking methods instead of frying. Healthy methods include roasting, grilling, broiling, baking, poaching, steaming, and stir-frying.  Limit your fluid intake, if directed by your health care provider. Fluid restriction may reduce symptoms of heart failure. Alcohol use  Do not drink alcohol if: ? Your health care provider tells you not to drink. ? Your heart was damaged by alcohol, or you have severe heart failure. ? You are pregnant, may be pregnant, or are planning to become pregnant.  If you drink alcohol: ? Limit how much you use to:  0-1 drink a day for women.  0-2 drinks a day for men. ? Be aware of how much alcohol is in your drink. In the [place] one drink equals one 12 oz bottle of beer (355 mL), one 5 oz glass of wine (148 mL), or one 1 oz glass of hard liquor (44 mL). Lifestyle   Do not use any products that contain nicotine or tobacco, such as cigarettes, e-cigarettes, and chewing tobacco. If you need help quitting, ask your health care provider. ? Do not use nicotine gum or patches before talking to your health  care provider.  Do not use illegal drugs.  Work with your health care provider to safely reach the right body weight.  Do physical activity if told by your health care provider. Talk to your health care provider before you begin an exercise if: ? You are an older adult. ? You have severe heart failure.  Learn to manage stress. If you need help to do this, ask your health care provider.  Participate in or seek rehabilitation as needed to keep or improve your independence and quality of life.  Plan rest periods when you get tired. Monitoring important information   Weigh yourself every day. This will help you to notice if too much fluid is building up in  your body. ? Weigh yourself every morning after you urinate and before you eat breakfast. ? Wear the same amount of clothing each time you weigh yourself. ? Record your daily weight. Provide your health care provider with your weight record.  Monitor and record your pulse and blood pressure as told by your health care provider. Dealing with extreme temperatures  If the weather is extremely hot: ? Avoid vigorous physical activity. ? Use air conditioning or fans, or find a cooler location. ? Avoid caffeine and alcohol. ? Wear loose-fitting, lightweight, and light-colored clothing.  If the weather is extremely cold: ? Avoid vigorous activity. ? Layer your clothes. ? Wear mittens or gloves, a hat, and a scarf when you go outside. ? Avoid alcohol. Follow these instructions at home:  Stay up to date with vaccines. Pneumococcal and flu (influenza) vaccines are especially important in preventing infections of the airways.  Keep all follow-up visits as told by your health care provider. This is important. Contact a health care provider if you:  Have a rapid weight gain.  Have increasing shortness of breath.  Are unable to participate in your usual physical activities.  Get tired easily.  Cough more than normal, especially with physical activity.  Lose your appetite or feel nauseous.  Have any swelling or more swelling in areas such as your hands, feet, ankles, or abdomen.  Are unable to sleep because it is hard to breathe.  Feel like your heart is beating quickly (palpitations).  Become dizzy or light-headed when you stand up. Get help right away if you:  Have trouble breathing.  Notice or your family notices a change in your awareness, such as having trouble staying awake or concentrating.  Have pain or discomfort in your chest.  Have an episode of fainting (syncope). These symptoms may represent a serious problem that is an emergency. Do not wait to see if the symptoms  will go away. Get medical help right away. Call your local emergency services (911 in the [place] Do not drive yourself to the hospital. Summary  Heart failure is a serious condition. To care for yourself, you may be asked to change your diet, take certain medicines, and make other lifestyle changes.  Take your medicines every day. Do not stop taking them unless your health care provider tells you to do so.  Eat heart-healthy foods, such as fresh or frozen fruits and vegetables, fish, lean meats, legumes, fat-free or low-fat dairy products, and whole-grain or high-fiber foods.  Ask your health care provider if you have any alcohol restrictions. You may have to stop drinking alcohol if you have severe heart failure.  Contact your health care provider if you notice problems, such as rapid weight gain or a fast heartbeat. Get help right away if  you faint, or have chest pain or trouble breathing. This information is not intended to replace advice given to you by your health care provider. Make sure you discuss any questions you have with your health care provider. Document Revised: 10/16/2018 Document Reviewed: 10/17/2018 Elsevier Patient Education  [place].

## 2020-03-25 ENCOUNTER — Ambulatory Visit (INDEPENDENT_AMBULATORY_CARE_PROVIDER_SITE_OTHER): Payer: Medicare HMO | Admitting: *Deleted

## 2020-03-25 DIAGNOSIS — I442 Atrioventricular block, complete: Secondary | ICD-10-CM

## 2020-03-25 LAB — CUP PACEART REMOTE DEVICE CHECK
Battery Remaining Longevity: 106 mo
Battery Remaining Percentage: 95.5 %
Battery Voltage: 3.01 V
Brady Statistic RV Percent Paced: 93 %
Date Time Interrogation Session: 20210908020018
Implantable Lead Implant Date: 20200824
Implantable Lead Location: 753860
Implantable Pulse Generator Implant Date: 20200824
Lead Channel Impedance Value: 450 Ohm
Lead Channel Pacing Threshold Amplitude: 1 V
Lead Channel Pacing Threshold Pulse Width: 0.5 ms
Lead Channel Sensing Intrinsic Amplitude: 2.1 mV
Lead Channel Setting Pacing Amplitude: 2.5 V
Lead Channel Setting Pacing Pulse Width: 0.5 ms
Lead Channel Setting Sensing Sensitivity: 0.7 mV
Pulse Gen Model: 1272
Pulse Gen Serial Number: 9140133

## 2020-03-26 NOTE — Progress Notes (Signed)
Remote pacemaker transmission.   

## 2020-03-31 ENCOUNTER — Other Ambulatory Visit: Payer: Self-pay | Admitting: Family Medicine

## 2020-03-31 MED ORDER — CARVEDILOL 12.5 MG PO TABS: 12.5000 mg | ORAL_TABLET | Freq: Two times a day (BID) | ORAL | 2 refills | Status: DC

## 2020-03-31 NOTE — Telephone Encounter (Signed)
Medication is usually prescribed by cardiologist.   Madaline Brilliant to refill?

## 2020-03-31 NOTE — Telephone Encounter (Signed)
Patient requesting a refill on his coreg 30 days called into [facility] on [address]. He states that [facility] has it on order however he is out.  CB# [phone]

## 2020-04-23 ENCOUNTER — Other Ambulatory Visit: Payer: Self-pay

## 2020-04-23 MED ORDER — CARVEDILOL 12.5 MG PO TABS: 12.5000 mg | ORAL_TABLET | Freq: Two times a day (BID) | ORAL | 2 refills | Status: DC

## 2020-04-23 NOTE — Telephone Encounter (Signed)
Prescription sent to pharmacy.

## 2020-04-23 NOTE — Telephone Encounter (Signed)
Pt needs refill sent to [facility] carvedilol (COREG) 12.5 MG tablet  Pt call back [phone]

## 2020-04-29 ENCOUNTER — Other Ambulatory Visit: Payer: Self-pay | Admitting: Family Medicine

## 2020-04-30 ENCOUNTER — Other Ambulatory Visit: Payer: Medicare HMO

## 2020-04-30 ENCOUNTER — Other Ambulatory Visit: Payer: Self-pay

## 2020-04-30 ENCOUNTER — Ambulatory Visit (INDEPENDENT_AMBULATORY_CARE_PROVIDER_SITE_OTHER): Payer: Medicare HMO | Admitting: Family Medicine

## 2020-04-30 DIAGNOSIS — E538 Deficiency of other specified B group vitamins: Secondary | ICD-10-CM | POA: Diagnosis not present

## 2020-04-30 DIAGNOSIS — E7800 Pure hypercholesterolemia, unspecified: Secondary | ICD-10-CM | POA: Diagnosis not present

## 2020-04-30 DIAGNOSIS — I10 Essential (primary) hypertension: Secondary | ICD-10-CM

## 2020-04-30 DIAGNOSIS — Z23 Encounter for immunization: Secondary | ICD-10-CM

## 2020-04-30 NOTE — Addendum Note (Signed)
Addended by: Saundra Shelling on: 04/30/2020 02:19 PM   Modules accepted: Orders

## 2020-05-01 LAB — LIPID PANEL
Cholesterol: 200 mg/dL — ABNORMAL HIGH (ref <200)
HDL: 65 mg/dL (ref >=40)
LDL Cholesterol (Calc): 117 mg/dL (calc) — ABNORMAL HIGH
Non-HDL Cholesterol (Calc): 135 mg/dL (calc) — ABNORMAL HIGH (ref <130)
Total CHOL/HDL Ratio: 3.1 (calc) (ref <5.0)
Triglycerides: 84 mg/dL (ref <150)

## 2020-05-01 LAB — COMPLETE METABOLIC PANEL WITH GFR
AG Ratio: 1.6 (calc) (ref 1.0–2.5)
ALT: 20 U/L (ref 9–46)
AST: 30 U/L (ref 10–35)
Albumin: 4.1 g/dL (ref 3.6–5.1)
Alkaline phosphatase (APISO): 75 U/L (ref 35–144)
BUN: 16 mg/dL (ref 7–25)
CO2: 27 mmol/L (ref 20–32)
Calcium: 9.4 mg/dL (ref 8.6–10.3)
Chloride: 102 mmol/L (ref 98–110)
Creat: 1.05 mg/dL (ref 0.70–1.11)
GFR, Est African American: 74 mL/min/{1.73_m2} (ref >=60)
GFR, Est Non African American: 64 mL/min/{1.73_m2} (ref >=60)
Globulin: 2.5 g/dL (calc) (ref 1.9–3.7)
Glucose, Bld: 107 mg/dL — ABNORMAL HIGH (ref 65–99)
Potassium: 4.1 mmol/L (ref 3.5–5.3)
Sodium: 139 mmol/L (ref 135–146)
Total Bilirubin: 1.4 mg/dL — ABNORMAL HIGH (ref 0.2–1.2)
Total Protein: 6.6 g/dL (ref 6.1–8.1)

## 2020-05-01 LAB — CBC WITH DIFFERENTIAL/PLATELET
Absolute Monocytes: 695 cells/uL (ref 200–950)
Basophils Absolute: 70 cells/uL (ref 0–200)
Basophils Relative: 0.8 %
Eosinophils Absolute: 123 cells/uL (ref 15–500)
Eosinophils Relative: 1.4 %
HCT: 40.7 % (ref 38.5–50.0)
Hemoglobin: 13.4 g/dL (ref 13.2–17.1)
Lymphs Abs: 1910 cells/uL (ref 850–3900)
MCH: 29.2 pg (ref 27.0–33.0)
MCHC: 32.9 g/dL (ref 32.0–36.0)
MCV: 88.7 fL (ref 80.0–100.0)
MPV: 12.3 fL (ref 7.5–12.5)
Monocytes Relative: 7.9 %
Neutro Abs: 6002 cells/uL (ref 1500–7800)
Neutrophils Relative %: 68.2 %
Platelets: 151 10*3/uL (ref 140–400)
RBC: 4.59 10*6/uL (ref 4.20–5.80)
RDW: 12.8 % (ref 11.0–15.0)
Total Lymphocyte: 21.7 %
WBC: 8.8 10*3/uL (ref 3.8–10.8)

## 2020-05-04 ENCOUNTER — Ambulatory Visit: Payer: Medicare HMO | Admitting: Cardiology

## 2020-05-11 ENCOUNTER — Telehealth: Payer: Self-pay | Admitting: Family Medicine

## 2020-05-11 NOTE — Telephone Encounter (Signed)
Cb# [phone] Lab results

## 2020-05-18 NOTE — Telephone Encounter (Signed)
LVM for pt regarding Lab results.

## 2020-05-20 ENCOUNTER — Telehealth: Payer: Self-pay | Admitting: Cardiology

## 2020-05-20 NOTE — Telephone Encounter (Signed)
OK to reschedule until he is feeling better.  Please let the schedulers know.

## 2020-05-20 NOTE — Telephone Encounter (Signed)
Will forward to Dr Percival Spanish for recommendations if should keep appt or reschedule ./cy

## 2020-05-20 NOTE — Telephone Encounter (Signed)
New Message:    Pt says he has an appt on Friday. He has a head cold, should he still come for his appt?

## 2020-05-22 ENCOUNTER — Ambulatory Visit: Payer: Medicare HMO | Admitting: Cardiology

## 2020-05-25 NOTE — Telephone Encounter (Signed)
Pt's appt has been rescheduled .Austin Gray

## 2020-06-10 ENCOUNTER — Other Ambulatory Visit: Payer: Self-pay | Admitting: Family Medicine

## 2020-06-24 ENCOUNTER — Ambulatory Visit (INDEPENDENT_AMBULATORY_CARE_PROVIDER_SITE_OTHER): Payer: Medicare HMO

## 2020-06-24 DIAGNOSIS — I442 Atrioventricular block, complete: Secondary | ICD-10-CM

## 2020-06-25 ENCOUNTER — Ambulatory Visit: Payer: Self-pay | Admitting: Pharmacist

## 2020-06-25 DIAGNOSIS — I10 Essential (primary) hypertension: Secondary | ICD-10-CM

## 2020-06-25 LAB — CUP PACEART REMOTE DEVICE CHECK
Battery Remaining Longevity: 114 mo
Battery Remaining Percentage: 95.5 %
Battery Voltage: 3.01 V
Brady Statistic RV Percent Paced: 86 %
Date Time Interrogation Session: 20211208020018
Implantable Lead Implant Date: 20200824
Implantable Lead Location: 753860
Implantable Pulse Generator Implant Date: 20200824
Lead Channel Impedance Value: 480 Ohm
Lead Channel Pacing Threshold Amplitude: 1 V
Lead Channel Pacing Threshold Pulse Width: 0.5 ms
Lead Channel Sensing Intrinsic Amplitude: 12 mV
Lead Channel Setting Pacing Amplitude: 2.5 V
Lead Channel Setting Pacing Pulse Width: 0.5 ms
Lead Channel Setting Sensing Sensitivity: 0.7 mV
Pulse Gen Model: 1272
Pulse Gen Serial Number: 9140133

## 2020-06-25 NOTE — Chronic Care Management (AMB) (Signed)
Chronic Care Management   Follow Up Note   06/30/2020 Name: Austin Gray. MRN: [id number] DOB: Aug 08, 1931  Referred by: Austin Frizzle, MD Reason for referral : No chief complaint on file.   Austin Gray. is a 86 y.o. year old male who is a primary care patient of Pickard, Cammie Mcgee, MD. The CCM team was consulted for assistance with chronic disease management and care coordination needs.    Review of patient status, including review of consultants reports, relevant laboratory and other test results, and collaboration with appropriate care team members and the patient's provider was performed as part of comprehensive patient evaluation and provision of chronic care management services.    SDOH (Social Determinants of Health) assessments performed: No See Care Plan activities for detailed interventions related to [facility])     Outpatient Encounter Medications as of 06/25/2020  Medication Sig  . albuterol (VENTOLIN HFA) 108 (90 Base) MCG/ACT inhaler INHALE 2 PUFFS EVERY 6 HOURS AS NEEDED FOR WHEEZING OR SHORTNESS OF BREATH  . amLODipine (NORVASC) 10 MG tablet TAKE 1 TABLET (10 MG TOTAL) BY MOUTH DAILY.  . carvedilol (COREG) 12.5 MG tablet Take 1 tablet (12.5 mg total) by mouth 2 (two) times daily.  . ferrous sulfate 325 (65 FE) MG tablet Take 325 mg by mouth every other day.   . fluticasone (FLONASE) 50 MCG/ACT nasal spray USE 1 SPRAY IN EACH NOSTRIL EVERY DAY  . furosemide (LASIX) 40 MG tablet Take 40 mg by mouth 2 (two) times daily.  Marland Kitchen losartan (COZAAR) 50 MG tablet TAKE 1 TABLET EVERY DAY  . Multiple Vitamin (MULTIVITAMIN) tablet Take 1 tablet by mouth daily.  . multivitamin-lutein (OCUVITE-LUTEIN) CAPS Take 1 capsule by mouth daily.  . potassium chloride (KLOR-CON) 10 MEQ tablet TAKE 1 TABLET EVERY DAY  . vitamin B-12 (CYANOCOBALAMIN) 1000 MCG tablet TAKE 1 TABLET EVERY DAY  . XARELTO 20 MG TABS tablet TAKE 1 TABLET EVERY DAY WITH SUPPER   Facility-Administered  Encounter Medications as of 06/25/2020  Medication  . cyanocobalamin ((VITAMIN B-12)) injection 1,000 mcg     Reviewed chart prior to disease state call. Spoke with patient regarding BP Recent OV/Consults: Following regularly with cardiology, there has been no recent changes to medications and condition is stable and controlled per cardiology.  Recent Office Vitals: BP Readings from Last 3 Encounters:  03/03/20 140/74  12/19/19 130/80  10/28/19 (!) 156/90   Pulse Readings from Last 3 Encounters:  03/03/20 70  12/19/19 90  10/28/19 83    Wt Readings from Last 3 Encounters:  03/03/20 229 lb 9.6 oz (104.1 kg)  12/19/19 222 lb (100.7 kg)  10/28/19 230 lb (104.3 kg)     Kidney Function Lab Results  Component Value Date/Time   CREATININE 1.05 04/30/2020 02:11 PM   CREATININE 0.95 12/19/2019 08:31 AM   GFRNONAA 64 04/30/2020 02:11 PM   GFRAA 74 04/30/2020 02:11 PM    BMP Latest Ref Rng & Units 04/30/2020 12/19/2019 07/15/2019  Glucose 65 - 99 mg/dL 107(H) 127(H) 109(H)  BUN 7 - 25 mg/dL 16 14 11   Creatinine 0.70 - 1.11 mg/dL 1.05 0.95 0.81  BUN/Creat Ratio 6 - 22 (calc) NOT APPLICABLE NOT APPLICABLE NOT APPLICABLE  Sodium 053 - 146 mmol/L 139 140 141  Potassium 3.5 - 5.3 mmol/L 4.1 3.7 3.9  Chloride 98 - 110 mmol/L 102 103 102  CO2 20 - 32 mmol/L 27 27 30   Calcium 8.6 - 10.3 mg/dL 9.4 9.6 9.4    .  Current antihypertensive regimen:  o Amlodipine 10mg  o Carvedilol 12.5mg  bid o Losartan 50mg  daily . How often are you checking your Blood Pressure? daily . Current home BP readings: 123/86 71 P, reports nothing above 140/90 . What recent interventions/DTPs have been made by any provider to improve Blood Pressure control since last CPP Visit: None . Any recent hospitalizations or ED visits since last visit with CPP? No . What diet changes have been made to improve Blood Pressure Control?  o None . What exercise is being done to improve your Blood Pressure Control?   o Minimal  Adherence Review: Is the patient currently on ACE/ARB medication? Yes Does the patient have >5 day gap between last estimated fill dates? No  Patient is monitoring his BP daily, reports all readings have been < 140/90 recently.  Denies any adverse effects at the moment.  Adherence report shows no gaps in fill dates for all three BP meds.  He was reminded of my number to call in he has trouble with accessing any of his medications due to cost or other issues.  Beverly Milch, PharmD Clinical Pharmacist [place] [phone]

## 2020-06-29 DIAGNOSIS — I482 Chronic atrial fibrillation, unspecified: Secondary | ICD-10-CM | POA: Insufficient documentation

## 2020-06-29 NOTE — Progress Notes (Deleted)
Cardiology Office Note   Date:  06/29/2020   ID:  Austin Mesi., DOB 05/30/1932, MRN [id number]  PCP:  [name], MD  Cardiologist:   Minus Breeding, MD   No chief complaint on file.     History of Present Illness: Austin Lingenfelter. is a 86 y.o. male who presents for evaluation of atrial fib and acute on chronic systolic and diastolic HF.  He was admitted on 09/05/2017 after he presented to his PCPs office with bradycardia and symptoms of volume overload.  Initial lab work also suggested acute kidney injury with creatinine elevation up to 1.5.  His heart rate was in the 30s in the ER.  His beta-blocker was held during the admission.  His ACE inhibitor was discontinued.  Echocardiogram obtained on 09/06/2017 showed mild LVH, EF 55-60%, moderate MR, moderate TR, PA peak pressure 36 mmHg.  He had normalization of the renal function by 09/15/2017.  He had CHB and a permanent pacemaker in August of 2020.Marland Kitchen   He returns for follow up.  ***   *** He walks with a cane.  He says occasionally he gets pain in his lumbar back.  He has to sit down.  He might get chest discomfort when that happens.  However, he does not ever take nitroglycerin.  He does not have shortness of breath, PND or orthopnea.  He does not have any palpitations, presyncope or syncope.  He has no weight gain or edema above and beyond his baseline.  Past Medical History:  Diagnosis Date  . Anxiety   . Arthritis    "little bit; left knee"  . Atrial fibrillation (HCC)    Chronic  . Basal cell carcinoma   . Cataracts, bilateral    to have left eye cataract surgery on 03/21/17   . CHF (congestive heart failure), NYHA class II, chronic, diastolic ([place])   . Colon polyps    last colonoscopy 2015 (tubular adenoma)  . Depression   . Diverticulosis   . Gastritis   . GERD (gastroesophageal reflux disease)   . H/O: GI bleed    Recurrent  . Hemorrhoids   . History of BPH   . History of kidney stones    hx of x 1    . Hypertension   . Pernicious anemia   . PVC (premature ventricular contraction)   . Rectal bleeding 02/15/12  . Vitamin D deficiency     Past Surgical History:  Procedure Laterality Date  . COLONOSCOPY WITH PROPOFOL N/A 03/17/2017   Procedure: COLONOSCOPY WITH PROPOFOL;  Surgeon: Carol Ada, MD;  [facility]: [facility];  Service: Endoscopy;  Laterality: N/A;  . [place]; ~ 2002   left; right  . PACEMAKER IMPLANT N/A 03/11/2019   Procedure: PACEMAKER IMPLANT;  Surgeon: Constance Haw, MD;  Location: [facility];  Service: Cardiovascular;  Laterality: N/A;  . TEMPORARY PACEMAKER N/A 03/10/2019   Procedure: TEMPORARY PACEMAKER;  Surgeon: Martinique, Peter M, MD;  Location: [place] [facility];  Service: Cardiovascular;  Laterality: N/A;     Current Outpatient Medications  Medication Sig Dispense Refill  . albuterol (VENTOLIN HFA) 108 (90 Base) MCG/ACT inhaler INHALE 2 PUFFS EVERY 6 HOURS AS NEEDED FOR WHEEZING OR SHORTNESS OF BREATH 18 g 3  . amLODipine (NORVASC) 10 MG tablet TAKE 1 TABLET (10 MG TOTAL) BY MOUTH DAILY. 90 tablet 3  . carvedilol (COREG) 12.5 MG tablet Take 1 tablet (12.5 mg total) by mouth  2 (two) times daily. 90 tablet 2  . ferrous sulfate 325 (65 FE) MG tablet Take 325 mg by mouth every other day.     . fluticasone (FLONASE) 50 MCG/ACT nasal spray USE 1 SPRAY IN EACH NOSTRIL EVERY DAY 32 g 1  . furosemide (LASIX) 40 MG tablet Take 40 mg by mouth 2 (two) times daily.    Marland Kitchen losartan (COZAAR) 50 MG tablet TAKE 1 TABLET EVERY DAY 90 tablet 3  . Multiple Vitamin (MULTIVITAMIN) tablet Take 1 tablet by mouth daily.    . multivitamin-lutein (OCUVITE-LUTEIN) CAPS Take 1 capsule by mouth daily.    . potassium chloride (KLOR-CON) 10 MEQ tablet TAKE 1 TABLET EVERY DAY 90 tablet 3  . vitamin B-12 (CYANOCOBALAMIN) 1000 MCG tablet TAKE 1 TABLET EVERY DAY 90 tablet 3  . XARELTO 20 MG TABS tablet TAKE 1 TABLET EVERY DAY WITH SUPPER 90 tablet 3   Current  Facility-Administered Medications  Medication Dose Route Frequency Provider Last Rate Last Admin  . cyanocobalamin ((VITAMIN B-12)) injection 1,000 mcg  1,000 mcg Intramuscular Q30 days [name], MD   1,000 mcg at 03/22/19 1139    Allergies:   Patient has no known allergies.    ROS:  Please see the history of present illness.   Otherwise, review of systems are positive for ***.   All other systems are reviewed and negative.    PHYSICAL EXAM: VS:  There were no vitals taken for this visit. , BMI There is no height or weight on file to calculate BMI.  GENERAL:  Well appearing NECK:  No jugular venous distention, waveform within normal limits, carotid upstroke brisk and symmetric, no bruits, no thyromegaly LUNGS:  Clear to auscultation bilaterally CHEST:  Well healed pacemaker pocket.   HEART:  PMI not displaced or sustained,S1 and S2 within normal limits, no S3, no clicks, no rubs, *** murmurs ABD:  Flat, positive bowel sounds normal in frequency in pitch, no bruits, no rebound, no guarding, no midline pulsatile mass, no hepatomegaly, no splenomegaly EXT:  2 plus pulses throughout, no edema, no cyanosis no clubbing    ***GENERAL:  Well appearing NECK:  No jugular venous distention, waveform within normal limits, carotid upstroke brisk and symmetric, no bruits, no thyromegaly LUNGS:  Clear to auscultation bilaterally CHEST:  Unremarkable HEART:  PMI not displaced or sustained,S1 and S2 within normal limits, no S3, no S4, no clicks, no rubs, no murmurs ABD:  Flat, positive bowel sounds normal in frequency in pitch, no bruits, no rebound, no guarding, no midline pulsatile mass, no hepatomegaly, no splenomegaly EXT:  2 plus pulses throughout, no edema, no cyanosis no clubbing   EKG:  EKG is *** ordered today. Atrial fibrillation, right bundle branch block, no acute ST-T wave changes.  Occasional paced beats  Recent Labs: 04/30/2020: ALT 20; BUN 16; Creat 1.05; Hemoglobin 13.4;  Platelets 151; Potassium 4.1; Sodium 139    Lipid Panel    Component Value Date/Time   CHOL 200 (H) 04/30/2020 1420   TRIG 84 04/30/2020 1420   HDL 65 04/30/2020 1420   CHOLHDL 3.1 04/30/2020 1420   VLDL 11 04/08/2016 0823   LDLCALC 117 (H) 04/30/2020 1420      Wt Readings from Last 3 Encounters:  03/03/20 229 lb 9.6 oz (104.1 kg)  12/19/19 222 lb (100.7 kg)  10/28/19 230 lb (104.3 kg)      Other studies Reviewed: Additional studies/ records that were reviewed today include:  *** Review of the above  records demonstrates:  ***   ASSESSMENT AND PLAN:  ATRIAL FIB:    CHA2DS2-VASc Score and unadjusted Ischemic Stroke Rate (% per year) is equal to 3.2 % stroke rate/year from a score of 3.  *** He tolerates anticoagulation.  No change in therapy.   MR:   *** This was mild last year.   No further imaging.   CHB/PERMANENT PACEMAKER:   *** The patient is up-to-date with pacemaker interrogation.  EDEMA:  *** He has very mild leg swelling.  OBESITY:   *** The patient understands the need to lose weight with diet and exercise  HTN:   The blood pressure is *** at target.  No change in therapy.  CHRONIC DIASTOLIC HF:    ***  He seems to be euvolemic.  No change in therapy.   COVID EDUCATION:   He has had one shot of his vaccine   Current medicines are reviewed at length with the patient today.  The patient does not have concerns regarding medicines.  The following changes have been made:  None   Labs/ tests ordered today include:  ***  No orders of the defined types were placed in this encounter.    Disposition:   FU with me in ***   Signed, Minus Breeding, MD  06/29/2020 7:41 AM    [facility]

## 2020-06-30 ENCOUNTER — Ambulatory Visit: Payer: Medicare HMO | Admitting: Cardiology

## 2020-07-01 ENCOUNTER — Other Ambulatory Visit: Payer: Self-pay | Admitting: Family Medicine

## 2020-07-07 NOTE — Progress Notes (Signed)
Remote pacemaker transmission.   

## 2020-07-16 ENCOUNTER — Other Ambulatory Visit: Payer: Self-pay | Admitting: Family Medicine

## 2020-08-10 ENCOUNTER — Ambulatory Visit: Payer: Medicare HMO | Admitting: Cardiology

## 2020-08-13 ENCOUNTER — Other Ambulatory Visit: Payer: Self-pay | Admitting: Family Medicine

## 2020-09-08 ENCOUNTER — Telehealth: Payer: Self-pay | Admitting: Cardiology

## 2020-09-08 NOTE — Telephone Encounter (Signed)
Patient requesting his appointment 09/10/2020 be virtual, because he has a cold.

## 2020-09-08 NOTE — Telephone Encounter (Signed)
As long as it can be video.  Thanks.

## 2020-09-09 NOTE — Progress Notes (Deleted)
Cardiology Office Note   Date:  09/09/2020   ID:  Austin Deike., DOB 12/21/1931, MRN [id number]  PCP:  [name], MD  Cardiologist:   Minus Breeding, MD   No chief complaint on file.     History of Present Illness: Austin Levi. is a 87 y.o. male who presents for evaluation of atrial fib and acute on chronic systolic and diastolic HF.  He was admitted on 09/05/2017 after he presented to his PCPs office with bradycardia and symptoms of volume overload.  Initial lab work also suggested acute kidney injury with creatinine elevation up to 1.5.  His heart rate was in the 30s in the ER.  His beta-blocker was held during the admission.  His ACE inhibitor was discontinued.  Echocardiogram obtained on 09/06/2017 showed mild LVH, EF 55-60%, moderate MR, moderate TR, PA peak pressure 36 mmHg.  He had normalization of the renal function by 09/15/2017.  He had CHB and a permanent pacemaker in August of 2020.    Since I last saw him ***  Past Medical History:  Diagnosis Date  . Anxiety   . Arthritis    "little bit; left knee"  . Atrial fibrillation (HCC)    Chronic  . Basal cell carcinoma   . Cataracts, bilateral    to have left eye cataract surgery on 03/21/17   . CHF (congestive heart failure), NYHA class II, chronic, diastolic ([place])   . Colon polyps    last colonoscopy 2015 (tubular adenoma)  . Depression   . Diverticulosis   . Gastritis   . GERD (gastroesophageal reflux disease)   . H/O: GI bleed    Recurrent  . Hemorrhoids   . History of BPH   . History of kidney stones    hx of x 1   . Hypertension   . Pernicious anemia   . PVC (premature ventricular contraction)   . Rectal bleeding 02/15/12  . Vitamin D deficiency     Past Surgical History:  Procedure Laterality Date  . COLONOSCOPY WITH PROPOFOL N/A 03/17/2017   Procedure: COLONOSCOPY WITH PROPOFOL;  Surgeon: Carol Ada, MD;  [facility];  Service: Endoscopy;  Laterality: N/A;  . [place]; ~ 2002   left; right  . PACEMAKER IMPLANT N/A 03/11/2019   Procedure: PACEMAKER IMPLANT;  Surgeon: Constance Haw, MD;  Location: [place] [facility];  Service: Cardiovascular;  Laterality: N/A;  . TEMPORARY PACEMAKER N/A 03/10/2019   Procedure: TEMPORARY PACEMAKER;  Surgeon: Martinique, Peter M, MD;  Location: [place] [facility];  Service: Cardiovascular;  Laterality: N/A;     Current Outpatient Medications  Medication Sig Dispense Refill  . albuterol (VENTOLIN HFA) 108 (90 Base) MCG/ACT inhaler INHALE 2 PUFFS EVERY 6 HOURS AS NEEDED FOR WHEEZING OR SHORTNESS OF BREATH 18 g 3  . amLODipine (NORVASC) 10 MG tablet TAKE 1 TABLET EVERY DAY 90 tablet 3  . carvedilol (COREG) 12.5 MG tablet TAKE 1 TABLET TWICE DAILY 180 tablet 3  . ferrous sulfate 325 (65 FE) MG tablet Take 325 mg by mouth every other day.     . fluticasone (FLONASE) 50 MCG/ACT nasal spray USE 1 SPRAY IN EACH NOSTRIL EVERY DAY 32 g 1  . furosemide (LASIX) 40 MG tablet Take 40 mg by mouth 2 (two) times daily.    Marland Kitchen losartan (COZAAR) 50 MG tablet TAKE 1 TABLET EVERY DAY 90 tablet 3  . Multiple Vitamin (MULTIVITAMIN) tablet Take 1  tablet by mouth daily.    . multivitamin-lutein (OCUVITE-LUTEIN) CAPS Take 1 capsule by mouth daily.    . potassium chloride (KLOR-CON) 10 MEQ tablet TAKE 1 TABLET EVERY DAY 90 tablet 3  . vitamin B-12 (CYANOCOBALAMIN) 1000 MCG tablet TAKE 1 TABLET EVERY DAY 90 tablet 3  . XARELTO 20 MG TABS tablet TAKE 1 TABLET EVERY DAY WITH SUPPER 90 tablet 3   Current Facility-Administered Medications  Medication Dose Route Frequency Provider Last Rate Last Admin  . cyanocobalamin ((VITAMIN B-12)) injection 1,000 mcg  1,000 mcg Intramuscular Q30 days [name], MD   1,000 mcg at 03/22/19 1139    Allergies:   Patient has no known allergies.    ROS:  Please see the history of present illness.   Otherwise, review of systems are positive for ***.   All other systems are reviewed and  negative.    PHYSICAL EXAM: VS:  There were no vitals taken for this visit. , BMI There is no height or weight on file to calculate BMI.  GENERAL:  Well appearing NECK:  No jugular venous distention, waveform within normal limits, carotid upstroke brisk and symmetric, no bruits, no thyromegaly LUNGS:  Clear to auscultation bilaterally CHEST:  Well healed pacemaker pocket.  HEART:  PMI not displaced or sustained,S1 and S2 within normal limits, no S3, no S4, no clicks, no rubs, *** murmurs ABD:  Flat, positive bowel sounds normal in frequency in pitch, no bruits, no rebound, no guarding, no midline pulsatile mass, no hepatomegaly, no splenomegaly EXT:  2 plus pulses throughout, no edema, no cyanosis no clubbing    ***GENERAL:  Well appearing NECK:  No jugular venous distention, waveform within normal limits, carotid upstroke brisk and symmetric, no bruits, no thyromegaly LUNGS:  Clear to auscultation bilaterally CHEST:  Unremarkable HEART:  PMI not displaced or sustained,S1 and S2 within normal limits, no S3, no S4, no clicks, no rubs, no murmurs ABD:  Flat, positive bowel sounds normal in frequency in pitch, no bruits, no rebound, no guarding, no midline pulsatile mass, no hepatomegaly, no splenomegaly EXT:  2 plus pulses throughout, no edema, no cyanosis no clubbing   EKG:  EKG is *** ordered today. Atrial fibrillation, rate ***, right bundle branch block, no acute ST-T wave changes.  Occasional paced beats  Recent Labs: 04/30/2020: ALT 20; BUN 16; Creat 1.05; Hemoglobin 13.4; Platelets 151; Potassium 4.1; Sodium 139    Lipid Panel    Component Value Date/Time   CHOL 200 (H) 04/30/2020 1420   TRIG 84 04/30/2020 1420   HDL 65 04/30/2020 1420   CHOLHDL 3.1 04/30/2020 1420   VLDL 11 04/08/2016 0823   LDLCALC 117 (H) 04/30/2020 1420      Wt Readings from Last 3 Encounters:  03/03/20 229 lb 9.6 oz (104.1 kg)  12/19/19 222 lb (100.7 kg)  10/28/19 230 lb (104.3 kg)       Other studies Reviewed: Additional studies/ records that were reviewed today include:  *** Review of the above records demonstrates:  ***   ASSESSMENT AND PLAN:  ATRIAL FIB:    CHA2DS2-VASc Score and unadjusted Ischemic Stroke Rate (% per year) is equal to 3.2 % stroke rate/year from a score of 3.  ***   He tolerates anticoagulation.  No change in therapy.   MR:   This was mild in 2020.  ***   last year.   No further imaging.   CHB/PERMANENT PACEMAKER:    He had normal device function in June.  ***  The patient is up-to-date with pacemaker interrogation.  EDEMA:    ***    He has very mild leg swelling.  OBESITY:    ***   The patient understands the need to lose weight with diet and exercise  HTN:   The blood pressure is ***  at target.  No change in therapy.  CHRONIC DIASTOLIC HF:    ***     He seems to be euvolemic.  No change in therapy.    Current medicines are reviewed at length with the patient today.  The patient does not have concerns regarding medicines.  The following changes have been made:  None   Labs/ tests ordered today include:  None   No orders of the defined types were placed in this encounter.    Disposition:   FU with me in all months.    Signed, Minus Breeding, MD  09/09/2020 12:55 PM    [facility]

## 2020-09-09 NOTE — Telephone Encounter (Signed)
Spoke to patient-he is feeling better, no cough/body aches/fever in the last 3-4 days. Only complaining of hip pain.  He request to keep appt in person tomorrow.

## 2020-09-10 ENCOUNTER — Ambulatory Visit: Payer: Medicare HMO | Admitting: Cardiology

## 2020-09-10 DIAGNOSIS — I480 Paroxysmal atrial fibrillation: Secondary | ICD-10-CM

## 2020-09-10 DIAGNOSIS — M7989 Other specified soft tissue disorders: Secondary | ICD-10-CM

## 2020-09-10 DIAGNOSIS — I340 Nonrheumatic mitral (valve) insufficiency: Secondary | ICD-10-CM

## 2020-09-10 DIAGNOSIS — I5032 Chronic diastolic (congestive) heart failure: Secondary | ICD-10-CM

## 2020-09-10 DIAGNOSIS — I10 Essential (primary) hypertension: Secondary | ICD-10-CM

## 2020-09-14 ENCOUNTER — Other Ambulatory Visit: Payer: Self-pay | Admitting: Family Medicine

## 2020-09-20 NOTE — Progress Notes (Signed)
Cardiology Office Note   Date:  09/22/2020   ID:  Austin Gutman., DOB [birth year], MRN [id number]  PCP:  Austin Frizzle, MD  Cardiologist:   Minus Breeding, MD   Chief Complaint  Patient presents with  . Atrial Fibrillation      History of Present Illness: Austin Hoecker. is a 87 y.o. male who presents for evaluation of atrial fib and acute on chronic systolic and diastolic HF.  He was admitted on 09/05/2017 after he presented to his PCPs office with bradycardia and symptoms of volume overload.  Initial lab work also suggested acute kidney injury with creatinine elevation up to 1.5.  His heart rate was in the 30s in the ER.  His beta-blocker was held during the admission.  His ACE inhibitor was discontinued.  Echocardiogram obtained on 09/06/2017 showed mild LVH, EF 55-60%, moderate MR, moderate TR, PA peak pressure 36 mmHg.  He had normalization of the renal function by 09/15/2017.  He had CHB and a permanent pacemaker in August of 2020.    Since I last saw him he has done okay.  He does get dyspneic with exertion such as walking 25 yards on level ground.  He is not having any PND or orthopnea.  He did have swelling in the past but he says that is well controlled.  He does not have any weight gain.  He has no palpitations, presyncope or syncope.  He thinks his dyspnea is just progressively chronic and not acute.  He has had no cough fevers or chills.  Not describing new chest pressure, neck or arm discomfort.  Past Medical History:  Diagnosis Date  . Anxiety   . Arthritis    "little bit; left knee"  . Atrial fibrillation (HCC)    Chronic  . Basal cell carcinoma   . Cataracts, bilateral    to have left eye cataract surgery on 03/21/17   . CHF (congestive heart failure), NYHA class II, chronic, diastolic ([place])   . Colon polyps    last colonoscopy 2015 (tubular adenoma)  . Depression   . Diverticulosis   . Gastritis   . GERD (gastroesophageal reflux disease)   . H/O: GI  bleed    Recurrent  . Hemorrhoids   . History of BPH   . History of kidney stones    hx of x 1   . Hypertension   . Pernicious anemia   . PVC (premature ventricular contraction)   . Rectal bleeding 02/15/12  . Vitamin D deficiency     Past Surgical History:  Procedure Laterality Date  . COLONOSCOPY WITH PROPOFOL N/A 03/17/2017   Procedure: COLONOSCOPY WITH PROPOFOL;  Surgeon: Carol Ada, MD;  [facility];  Service: Endoscopy;  Laterality: N/A;  . [place]; ~ 2002   left; right  . PACEMAKER IMPLANT N/A 03/11/2019   Procedure: PACEMAKER IMPLANT;  Surgeon: Constance Haw, MD;  Location: [facility];  Service: Cardiovascular;  Laterality: N/A;  . TEMPORARY PACEMAKER N/A 03/10/2019   Procedure: TEMPORARY PACEMAKER;  Surgeon: Martinique, Peter M, MD;  Location: [place] [facility];  Service: Cardiovascular;  Laterality: N/A;     Current Outpatient Medications  Medication Sig Dispense Refill  . albuterol (VENTOLIN HFA) 108 (90 Base) MCG/ACT inhaler INHALE 2 PUFFS EVERY 6 HOURS AS NEEDED FOR WHEEZING OR SHORTNESS OF BREATH 18 g 3  . amLODipine (NORVASC) 10 MG tablet TAKE 1 TABLET EVERY DAY 90 tablet 3  .  carvedilol (COREG) 12.5 MG tablet TAKE 1 TABLET TWICE DAILY 180 tablet 3  . ferrous sulfate 325 (65 FE) MG tablet Take 325 mg by mouth every other day.     . fluticasone (FLONASE) 50 MCG/ACT nasal spray USE 1 SPRAY IN EACH NOSTRIL EVERY DAY 32 g 1  . furosemide (LASIX) 40 MG tablet Take 40 mg by mouth 2 (two) times daily.    Marland Kitchen losartan (COZAAR) 50 MG tablet TAKE 1 TABLET EVERY DAY 90 tablet 3  . Multiple Vitamin (MULTIVITAMIN) tablet Take 1 tablet by mouth daily.    . multivitamin-lutein (OCUVITE-LUTEIN) CAPS Take 1 capsule by mouth daily.    . potassium chloride (KLOR-CON) 10 MEQ tablet TAKE 1 TABLET EVERY DAY 90 tablet 3  . vitamin B-12 (CYANOCOBALAMIN) 1000 MCG tablet TAKE 1 TABLET EVERY DAY 90 tablet 3  . XARELTO 20 MG TABS tablet TAKE 1 TABLET  EVERY DAY WITH SUPPER 90 tablet 3   Current Facility-Administered Medications  Medication Dose Route Frequency Provider Last Rate Last Admin  . cyanocobalamin ((VITAMIN B-12)) injection 1,000 mcg  1,000 mcg Intramuscular Q30 days Austin Frizzle, MD   1,000 mcg at 03/22/19 1139    Allergies:   Patient has no known allergies.    ROS:  Please see the history of present illness.   Otherwise, review of systems are positive for none.   All other systems are reviewed and negative.    PHYSICAL EXAM: VS:  BP (!) 150/84   Pulse 70   Ht 5\' 10"  (1.778 m)   Wt 223 lb (101.2 kg)   SpO2 97%   BMI 32.00 kg/m  , BMI Body mass index is 32 kg/m.  GENERAL:  Well appearing NECK:  No jugular venous distention, waveform within normal limits, carotid upstroke brisk and symmetric, no bruits, no thyromegaly LUNGS:  Clear to auscultation bilaterally CHEST:  Well healed pacemaker pocket.  HEART:  PMI not displaced or sustained,S1 and S2 within normal limits, no S3, no S4, no clicks, no rubs, 3 out of 6 holosystolic murmur heard at the apex and radiating to the axilla, no diastolic murmurs ABD:  Flat, positive bowel sounds normal in frequency in pitch, no bruits, no rebound, no guarding, no midline pulsatile mass, no hepatomegaly, no splenomegaly EXT:  2 plus pulses throughout, mild bilateral ankle edema, no cyanosis no clubbing   EKG:  EKG is  ordered today. Atrial fibrillation, ventricular pacing 100% capture rate 70  Recent Labs: 04/30/2020: ALT 20; BUN 16; Creat 1.05; Hemoglobin 13.4; Platelets 151; Potassium 4.1; Sodium 139    Lipid Panel    Component Value Date/Time   CHOL 200 (H) 04/30/2020 1420   TRIG 84 04/30/2020 1420   HDL 65 04/30/2020 1420   CHOLHDL 3.1 04/30/2020 1420   VLDL 11 04/08/2016 0823   LDLCALC 117 (H) 04/30/2020 1420      Wt Readings from Last 3 Encounters:  09/22/20 223 lb (101.2 kg)  03/03/20 229 lb 9.6 oz (104.1 kg)  12/19/19 222 lb (100.7 kg)      Other  studies Reviewed: Additional studies/ records that were reviewed today include:  Labs, device interrogation Review of the above records demonstrates: See elsewhere   ASSESSMENT AND PLAN:  ATRIAL FIB:    CHA2DS2-VASc Score and unadjusted Ischemic Stroke Rate (% per year) is equal to 3.2 % stroke rate/year from a score of 3.  He is up-to-date with device follow-up.  He tolerates anticoagulation and is up-to-date with labs late last year.  No change in therapy.  MR:   This was mild in 2020.  Her murmur is more prominent.  I would like to follow-up with an echo.  CHB/PERMANENT PACEMAKER:    He had normal device function at the last interrogation.  I reviewed this.  He paces 86% of the time and has 9.3 to 9.7 years of battery life.  EDEMA:    This is mild and controlled with meds as listed.  HTN:   The blood pressure is elevated today but he says it is always well controlled at home with systolics in the 335K.  CHRONIC DIASTOLIC HF:    He did have a very mildly elevated BNP previously.  I think he is overtly volume overloaded now.  I will continue diuretics.  He understands salt and fluid collection and he watches this for the most part.   Current medicines are reviewed at length with the patient today.  The patient does not have concerns regarding medicines.  The following changes have been made:  None   Labs/ tests ordered today include:    Orders Placed This Encounter  Procedures  . EKG 12-Lead  . ECHOCARDIOGRAM COMPLETE     Disposition:   FU with me in 12 months.    Signed, Minus Breeding, MD  09/22/2020 2:06 PM    [facility]

## 2020-09-22 ENCOUNTER — Ambulatory Visit: Payer: Medicare HMO | Admitting: Cardiology

## 2020-09-22 ENCOUNTER — Other Ambulatory Visit: Payer: Self-pay

## 2020-09-22 ENCOUNTER — Encounter: Payer: Self-pay | Admitting: Cardiology

## 2020-09-22 VITALS — BP 150/84 | HR 70 | Ht 70.0 in | Wt 223.0 lb

## 2020-09-22 DIAGNOSIS — I5032 Chronic diastolic (congestive) heart failure: Secondary | ICD-10-CM | POA: Diagnosis not present

## 2020-09-22 DIAGNOSIS — I340 Nonrheumatic mitral (valve) insufficiency: Secondary | ICD-10-CM

## 2020-09-22 DIAGNOSIS — I10 Essential (primary) hypertension: Secondary | ICD-10-CM

## 2020-09-22 DIAGNOSIS — M7989 Other specified soft tissue disorders: Secondary | ICD-10-CM | POA: Diagnosis not present

## 2020-09-22 DIAGNOSIS — I4821 Permanent atrial fibrillation: Secondary | ICD-10-CM

## 2020-09-22 NOTE — Patient Instructions (Signed)
Medication Instructions:  Your physician recommends that you continue on your current medications as directed. Please refer to the Current Medication list given to you today.   *If you need a refill on your cardiac medications before your next appointment, please call your pharmacy*  Lab Work: NONE   Testing/Procedures: Your physician has requested that you have an echocardiogram. Echocardiography is a painless test that uses sound waves to create images of your heart. It provides your doctor with information about the size and shape of your heart and how well your heart's chambers and valves are working. This procedure takes approximately one hour. There are no restrictions for this procedure. [place] [address]  Follow-Up: At [facility], you and your health needs are our priority.  As part of our continuing mission to provide you with exceptional heart care, we have created designated Provider Care Teams.  These Care Teams include your primary Cardiologist (physician) and Advanced Practice Providers (APPs -  Physician Assistants and Nurse Practitioners) who all work together to provide you with the care you need, when you need it.  We recommend signing up for the patient portal called "MyChart".  Sign up information is provided on this After Visit Summary.  MyChart is used to connect with patients for Virtual Visits (Telemedicine).  Patients are able to view lab/test results, encounter notes, upcoming appointments, etc.  Non-urgent messages can be sent to your provider as well.   To learn more about what you can do with MyChart, go to [email or web address].    Your next appointment:   12 month(s)  The format for your next appointment:   In Person  Provider:   DR [facility]

## 2020-09-23 ENCOUNTER — Ambulatory Visit (INDEPENDENT_AMBULATORY_CARE_PROVIDER_SITE_OTHER): Payer: Medicare HMO

## 2020-09-23 DIAGNOSIS — I442 Atrioventricular block, complete: Secondary | ICD-10-CM | POA: Diagnosis not present

## 2020-09-25 ENCOUNTER — Telehealth: Payer: Self-pay | Admitting: Emergency Medicine

## 2020-09-25 LAB — CUP PACEART REMOTE DEVICE CHECK
Battery Remaining Longevity: 112 mo
Battery Remaining Percentage: 95.5 %
Battery Voltage: 3.01 V
Brady Statistic RV Percent Paced: 92 %
Date Time Interrogation Session: 20220309020014
Implantable Lead Implant Date: 20200824
Implantable Lead Location: 753860
Implantable Pulse Generator Implant Date: 20200824
Lead Channel Impedance Value: 440 Ohm
Lead Channel Pacing Threshold Amplitude: 1 V
Lead Channel Pacing Threshold Pulse Width: 0.5 ms
Lead Channel Sensing Intrinsic Amplitude: 12 mV
Lead Channel Setting Pacing Amplitude: 2.5 V
Lead Channel Setting Pacing Pulse Width: 0.5 ms
Lead Channel Setting Sensing Sensitivity: 0.7 mV
Pulse Gen Model: 1272
Pulse Gen Serial Number: 9140133

## 2020-09-25 NOTE — Telephone Encounter (Signed)
LMOM to call device clinic with call back #. Assess for s/sx with 33 beat episode of NSVT vs AF with RVR on 09/18/20 at 1350.

## 2020-09-25 NOTE — Telephone Encounter (Signed)
Pt returning nurse call

## 2020-09-25 NOTE — Telephone Encounter (Signed)
Patient was unaware of episode that occurred on 09/18/20. Asymptomatic. Was seen by cardiology 09/23/20 and has echo scheduled but no other changes to treatment plan at that visit.

## 2020-10-01 NOTE — Progress Notes (Signed)
Remote pacemaker transmission.   

## 2020-10-07 ENCOUNTER — Telehealth: Payer: Self-pay | Admitting: *Deleted

## 2020-10-07 MED ORDER — CARVEDILOL 25 MG PO TABS: 25.0000 mg | ORAL_TABLET | Freq: Two times a day (BID) | ORAL | 1 refills | Status: DC

## 2020-10-07 NOTE — Telephone Encounter (Signed)
Patient verbalized understanding and agreeable to plan.

## 2020-10-07 NOTE — Telephone Encounter (Signed)
10/07/2020 10:53 AM EDT Back to Top     Left message to call back   Stanton Kidney, RN  09/28/2020 4:49 PM EDT      Reviewed with pt.  He is asking if he needs to truly increase this for only an episode. States he checks his HRs at home about 3 times/day and they are always normal. Aware I will look into this further and have Dr. Curt Bears review/advise. Aware it will be next week before hearing back from me if Dr. Curt Bears wants him to increase medication still. Aware I will NOT call back if he advised to monitor and no medication increase at this time. Pt is agreeable to plan.   Will Meredith Leeds, MD  09/25/2020 2:16 PM EST      Abnormal device interrogation reviewed. Lead parameters and battery status stable. Wide complex, regular tachycardia. Increase coreg to 25 mg BID.

## 2020-10-07 NOTE — Telephone Encounter (Signed)
-----   Message from Stanton Kidney, RN sent at 09/28/2020  4:49 PM EDT ----- Reviewed with pt.   He is asking if he needs to truly increase this for only an episode.  States he checks his HRs at home about 3 times/day and they are always normal. Aware I will look into this further and have Dr. Curt Bears review/advise.  Aware it will be next week before hearing back from me if Dr. Curt Bears wants him to increase medication still.  Aware I will NOT call back if he advised to monitor and no medication increase at this time. Pt is agreeable to plan.

## 2020-10-16 ENCOUNTER — Other Ambulatory Visit (HOSPITAL_COMMUNITY): Payer: Medicare HMO

## 2020-11-17 ENCOUNTER — Other Ambulatory Visit: Payer: Self-pay

## 2020-11-17 ENCOUNTER — Ambulatory Visit (HOSPITAL_COMMUNITY): Payer: Medicare HMO | Attending: Cardiovascular Disease

## 2020-11-17 DIAGNOSIS — I340 Nonrheumatic mitral (valve) insufficiency: Secondary | ICD-10-CM | POA: Diagnosis not present

## 2020-11-17 DIAGNOSIS — I5032 Chronic diastolic (congestive) heart failure: Secondary | ICD-10-CM | POA: Diagnosis not present

## 2020-11-17 DIAGNOSIS — I10 Essential (primary) hypertension: Secondary | ICD-10-CM | POA: Diagnosis not present

## 2020-11-17 DIAGNOSIS — I4821 Permanent atrial fibrillation: Secondary | ICD-10-CM | POA: Diagnosis not present

## 2020-11-17 LAB — ECHOCARDIOGRAM COMPLETE
Area-P 1/2: 3.65 cm2
P 1/2 time: 713 msec
S' Lateral: 4.1 cm

## 2020-11-24 ENCOUNTER — Telehealth: Payer: Self-pay | Admitting: Pharmacist

## 2020-11-24 NOTE — Progress Notes (Addendum)
Chronic Care Management Pharmacy Assistant   Name: Percival Glasheen.  MRN: [id number] DOB: 10/15/1931  Reason for Encounter: Disease State For HTN.   Conditions to be addressed/monitored: HTN, Afib, CHF, dyslipidemia.  Recent office visits:  None since 06/25/20  Recent consult visits:  09/22/20 Cardiology Minus Breeding, MD. For Follow-Up. Per note: CT scan scheduled.No medication changes.  Hospital visits:  None in previous 6 months  Medications: Outpatient Encounter Medications as of 11/24/2020  Medication Sig   albuterol (VENTOLIN HFA) 108 (90 Base) MCG/ACT inhaler INHALE 2 PUFFS EVERY 6 HOURS AS NEEDED FOR WHEEZING OR SHORTNESS OF BREATH   amLODipine (NORVASC) 10 MG tablet TAKE 1 TABLET EVERY DAY   carvedilol (COREG) 25 MG tablet Take 1 tablet (25 mg total) by mouth 2 (two) times daily.   ferrous sulfate 325 (65 FE) MG tablet Take 325 mg by mouth every other day.    fluticasone (FLONASE) 50 MCG/ACT nasal spray USE 1 SPRAY IN EACH NOSTRIL EVERY DAY   furosemide (LASIX) 40 MG tablet Take 40 mg by mouth 2 (two) times daily.   losartan (COZAAR) 50 MG tablet TAKE 1 TABLET EVERY DAY   Multiple Vitamin (MULTIVITAMIN) tablet Take 1 tablet by mouth daily.   multivitamin-lutein (OCUVITE-LUTEIN) CAPS Take 1 capsule by mouth daily.   potassium chloride (KLOR-CON) 10 MEQ tablet TAKE 1 TABLET EVERY DAY   vitamin B-12 (CYANOCOBALAMIN) 1000 MCG tablet TAKE 1 TABLET EVERY DAY   XARELTO 20 MG TABS tablet TAKE 1 TABLET EVERY DAY WITH SUPPER   Facility-Administered Encounter Medications as of 11/24/2020  Medication   cyanocobalamin ((VITAMIN B-12)) injection 1,000 mcg   Reviewed chart prior to disease state call. Spoke with patient regarding BP  Recent Office Vitals: BP Readings from Last 3 Encounters:  09/22/20 (!) 150/84  03/03/20 140/74  12/19/19 130/80   Pulse Readings from Last 3 Encounters:  09/22/20 70  03/03/20 70  12/19/19 90    Wt Readings from Last 3 Encounters:   09/22/20 223 lb (101.2 kg)  03/03/20 229 lb 9.6 oz (104.1 kg)  12/19/19 222 lb (100.7 kg)     Kidney Function Lab Results  Component Value Date/Time   CREATININE 1.05 04/30/2020 02:11 PM   CREATININE 0.95 12/19/2019 08:31 AM   GFRNONAA 64 04/30/2020 02:11 PM   GFRAA 74 04/30/2020 02:11 PM    BMP Latest Ref Rng & Units 04/30/2020 12/19/2019 07/15/2019  Glucose 65 - 99 mg/dL 107(H) 127(H) 109(H)  BUN 7 - 25 mg/dL 16 14 11   Creatinine 0.70 - 1.11 mg/dL 1.05 0.95 0.81  BUN/Creat Ratio 6 - 22 (calc) NOT APPLICABLE NOT APPLICABLE NOT APPLICABLE  Sodium 542 - 146 mmol/L 139 140 141  Potassium 3.5 - 5.3 mmol/L 4.1 3.7 3.9  Chloride 98 - 110 mmol/L 102 103 102  CO2 20 - 32 mmol/L 27 27 30   Calcium 8.6 - 10.3 mg/dL 9.4 9.6 9.4    Current antihypertensive regimen:  Losartan 50 mg daily Amlodipine 10 mg daily  Carvedilol 25 mg take 2 12.5 mg BID.   How often are you checking your Blood Pressure? daily   Current home BP readings: Patient stated his blood pressure this morning was 134/80 Heart beat 70.  What recent interventions/DTPs have been made by any provider to improve Blood Pressure control since last CPP Visit: None  Any recent hospitalizations or ED visits since last visit with CPP? Patient stated no.   What diet changes have been made to improve Blood Pressure  Control?  He stated he makes most of his meals. He stated he doesn't each much meat but he does eat corn beef liver pending.   What exercise is being done to improve your Blood Pressure Control?  Patient stated he does not do much, his walking is limited. He stated he does walk from room to room.   Adherence Review: Is the patient currently on ACE/ARB medication? Losartan 50 mg daily  Does the patient have >5 day gap between last estimated fill dates? Per misc rpts, no.  Star Rating Drugs:Losartan 50 mg daily 90 DS 10/07/20  Patient stated he does not have any concerns about the price of his medications but he is  concerned about a side effect he believes he is experiencing. Ive asked the pharmacist to give him a call today.    Follow-Up:Pharmacist Review  Charlann Lange, RMA Clinical Pharmacist Assistant [phone]  20 minutes spent in review, coordination, and documentation. Discussed he was having blurry/dark vision.  Inquired about Carvedilol.  Does have 1-3% change of causing blurred vision and 5% chance of visual disturbances.  Would want to make sure it is not cataract or some other ophthalmologic issue first.  Patient to discuss with PCP at upcoming visit.  Reviewed by: Beverly Milch, PharmD Clinical Pharmacist [facility] [phone]

## 2020-12-01 ENCOUNTER — Other Ambulatory Visit: Payer: Self-pay

## 2020-12-01 ENCOUNTER — Other Ambulatory Visit: Payer: Medicare HMO

## 2020-12-01 DIAGNOSIS — I10 Essential (primary) hypertension: Secondary | ICD-10-CM | POA: Diagnosis not present

## 2020-12-01 DIAGNOSIS — E785 Hyperlipidemia, unspecified: Secondary | ICD-10-CM | POA: Diagnosis not present

## 2020-12-02 LAB — COMPLETE METABOLIC PANEL WITH GFR
AG Ratio: 1.7 (calc) (ref 1.0–2.5)
ALT: 18 U/L (ref 9–46)
AST: 25 U/L (ref 10–35)
Albumin: 4 g/dL (ref 3.6–5.1)
Alkaline phosphatase (APISO): 82 U/L (ref 35–144)
BUN: 12 mg/dL (ref 7–25)
CO2: 30 mmol/L (ref 20–32)
Calcium: 9.4 mg/dL (ref 8.6–10.3)
Chloride: 102 mmol/L (ref 98–110)
Creat: 0.94 mg/dL (ref 0.70–1.11)
GFR, Est African American: 85 mL/min/{1.73_m2} (ref >=60)
GFR, Est Non African American: 73 mL/min/{1.73_m2} (ref >=60)
Globulin: 2.3 g/dL (calc) (ref 1.9–3.7)
Glucose, Bld: 129 mg/dL — ABNORMAL HIGH (ref 65–99)
Potassium: 3.5 mmol/L (ref 3.5–5.3)
Sodium: 141 mmol/L (ref 135–146)
Total Bilirubin: 2 mg/dL — ABNORMAL HIGH (ref 0.2–1.2)
Total Protein: 6.3 g/dL (ref 6.1–8.1)

## 2020-12-02 LAB — CBC WITH DIFFERENTIAL/PLATELET
Absolute Monocytes: 590 cells/uL (ref 200–950)
Basophils Absolute: 47 cells/uL (ref 0–200)
Basophils Relative: 0.7 %
Eosinophils Absolute: 127 cells/uL (ref 15–500)
Eosinophils Relative: 1.9 %
HCT: 38 % — ABNORMAL LOW (ref 38.5–50.0)
Hemoglobin: 12.2 g/dL — ABNORMAL LOW (ref 13.2–17.1)
Lymphs Abs: 1179 cells/uL (ref 850–3900)
MCH: 29 pg (ref 27.0–33.0)
MCHC: 32.1 g/dL (ref 32.0–36.0)
MCV: 90.3 fL (ref 80.0–100.0)
MPV: 12.7 fL — ABNORMAL HIGH (ref 7.5–12.5)
Monocytes Relative: 8.8 %
Neutro Abs: 4757 cells/uL (ref 1500–7800)
Neutrophils Relative %: 71 %
Platelets: 104 10*3/uL — ABNORMAL LOW (ref 140–400)
RBC: 4.21 10*6/uL (ref 4.20–5.80)
RDW: 13.2 % (ref 11.0–15.0)
Total Lymphocyte: 17.6 %
WBC: 6.7 10*3/uL (ref 3.8–10.8)

## 2020-12-02 LAB — LIPID PANEL
Cholesterol: 179 mg/dL (ref <200)
HDL: 62 mg/dL (ref >=40)
LDL Cholesterol (Calc): 103 mg/dL (calc) — ABNORMAL HIGH
Non-HDL Cholesterol (Calc): 117 mg/dL (calc) (ref <130)
Total CHOL/HDL Ratio: 2.9 (calc) (ref <5.0)
Triglycerides: 58 mg/dL (ref <150)

## 2020-12-03 ENCOUNTER — Telehealth: Payer: Self-pay | Admitting: Cardiology

## 2020-12-03 DIAGNOSIS — I340 Nonrheumatic mitral (valve) insufficiency: Secondary | ICD-10-CM

## 2020-12-03 DIAGNOSIS — I4821 Permanent atrial fibrillation: Secondary | ICD-10-CM

## 2020-12-03 NOTE — Telephone Encounter (Signed)
PT is returning a call about ECHO results  

## 2020-12-03 NOTE — Telephone Encounter (Signed)
Per ECHO result:  Minus Breeding, MD 11/21/2020 2:47 PM EDT I have tried to get in contact with him a couple of times discussed the results of his transthoracic echo with severe MR. If he is willing he probably needs a TEE and consideration of possible repair of this valve. I have not been able to contact him twice on the phone.  Pt would like to have the TEE done. Pt states that he would like to leave a detailed message if he does not answer  Order entered

## 2020-12-04 ENCOUNTER — Other Ambulatory Visit: Payer: Self-pay | Admitting: *Deleted

## 2020-12-04 MED ORDER — CARVEDILOL 25 MG PO TABS: 25.0000 mg | ORAL_TABLET | Freq: Two times a day (BID) | ORAL | 1 refills | Status: DC

## 2020-12-08 ENCOUNTER — Ambulatory Visit (INDEPENDENT_AMBULATORY_CARE_PROVIDER_SITE_OTHER): Payer: Medicare HMO | Admitting: Family Medicine

## 2020-12-08 ENCOUNTER — Encounter: Payer: Self-pay | Admitting: Family Medicine

## 2020-12-08 ENCOUNTER — Other Ambulatory Visit: Payer: Self-pay

## 2020-12-08 VITALS — BP 128/70 | HR 70 | Temp 97.8°F | Resp 18 | Ht 70.0 in | Wt 230.0 lb

## 2020-12-08 DIAGNOSIS — I442 Atrioventricular block, complete: Secondary | ICD-10-CM

## 2020-12-08 DIAGNOSIS — I4819 Other persistent atrial fibrillation: Secondary | ICD-10-CM

## 2020-12-08 DIAGNOSIS — I5032 Chronic diastolic (congestive) heart failure: Secondary | ICD-10-CM

## 2020-12-08 DIAGNOSIS — I10 Essential (primary) hypertension: Secondary | ICD-10-CM

## 2020-12-08 NOTE — Telephone Encounter (Signed)
Patient will need an updated visit either in person or virtual visit to be in compliance with [place] within 30 day visit  Left message to call back to arrange

## 2020-12-08 NOTE — Telephone Encounter (Signed)
-----   Message from Minus Breeding, MD sent at 12/03/2020  7:04 PM EDT ----- The patient finally received the results.  We were able to contact him.  Please make sure TEE is scheduled.  Thanks.

## 2020-12-08 NOTE — Progress Notes (Signed)
Subjective:    Patient ID: Austin Gray., male    DOB: 05/16/1932, 87 y.o.   MRN: [id number]  Hypertension    Patient is a very pleasant 87 year old Caucasian male here today for checkup.  He has a history of paroxysmal atrial fibrillation.  He also has a history of complete AV block requiring a pacemaker due to symptomatic bradycardia. He also has a history of lower GI bleed and anemia secondary to that. Recent echo (5/3): IMPRESSIONS    1. There is likely severe mitral regurgitation. The jet is eccentric and  anteriorly directed. There is splay artifact suggestive of at least  moderate regurgitation. The MR is poorly quantitated. Most impresive  images are PLAX. Suspect it is just very  eccentric. There is likely a flail PMVL chord to explain these findings,  and abrupt change from prior echo. Would recommend a TEE for better  characterization. The mitral valve is myxomatous. Severe mitral valve  regurgitation. No evidence of mitral  stenosis.  2. Left ventricular ejection fraction, by estimation, is 55 to 60%. The  left ventricle has normal function. The left ventricle has no regional  wall motion abnormalities. Left ventricular diastolic function could not  be evaluated.  3. Right ventricular systolic function is normal. The right ventricular  size is mildly enlarged. There is mildly elevated pulmonary artery  systolic pressure. The estimated right ventricular systolic pressure is  69.6 mmHg.  4. Left atrial size was severely dilated.  5. Right atrial size was severely dilated.  6. Tricuspid valve regurgitation is mild to moderate.  7. The aortic valve is tricuspid. There is mild calcification of the  aortic valve. Aortic valve regurgitation is trivial. Mild aortic valve  sclerosis is present, with no evidence of aortic valve stenosis.  8. The inferior vena cava is dilated in size with >50% respiratory  variability, suggesting right atrial pressure of 8 mmHg.    Today the patient's heart rhythm is regular.  He is not in atrial fibrillation today.  However he does have holosystolic murmur heard best at the lower left sternal border.  I believe this is consistent with the severe mitral regurgitation seen on echocardiogram.  We spent the entire visit today discussing this.  He denies any chest pain.  He denies any orthopnea.  He denies any paroxysmal nocturnal dyspnea.  He denies any syncope or presyncope.  They are scheduling his TEE.  I explained to him the implications of mitral regurgitation.  He states that he is completely asymptomatic.  He has yet to get the TEE scheduled.  His most recent lab work is listed below.  Overall the patient states he is doing well to be 87 years old.  He seems to be in good spirits.  Lab on 12/01/2020  Component Date Value Ref Range Status  . WBC 12/01/2020 6.7  3.8 - 10.8 Thousand/uL Final  . RBC 12/01/2020 4.21  4.20 - 5.80 Million/uL Final  . Hemoglobin 12/01/2020 12.2* 13.2 - 17.1 g/dL Final  . HCT 12/01/2020 38.0* 38.5 - 50.0 % Final  . MCV 12/01/2020 90.3  80.0 - 100.0 fL Final  . MCH 12/01/2020 29.0  27.0 - 33.0 pg Final  . MCHC 12/01/2020 32.1  32.0 - 36.0 g/dL Final  . RDW 12/01/2020 13.2  11.0 - 15.0 % Final  . Platelets 12/01/2020 104* 140 - 400 Thousand/uL Final  . MPV 12/01/2020 12.7* 7.5 - 12.5 fL Final  . Neutro Abs 12/01/2020 4,757  1,500 - 7,800  cells/uL Final  . Lymphs Abs 12/01/2020 1,179  850 - 3,900 cells/uL Final  . Absolute Monocytes 12/01/2020 590  200 - 950 cells/uL Final  . Eosinophils Absolute 12/01/2020 127  15 - 500 cells/uL Final  . Basophils Absolute 12/01/2020 47  0 - 200 cells/uL Final  . Neutrophils Relative % 12/01/2020 71  % Final  . Total Lymphocyte 12/01/2020 17.6  % Final  . Monocytes Relative 12/01/2020 8.8  % Final  . Eosinophils Relative 12/01/2020 1.9  % Final  . Basophils Relative 12/01/2020 0.7  % Final  . Glucose, Bld 12/01/2020 129* 65 - 99 mg/dL Final   Comment: .             Fasting reference interval . For someone without known diabetes, a glucose value >125 mg/dL indicates that they may have diabetes and this should be confirmed with a follow-up test. .   . BUN 12/01/2020 12  7 - 25 mg/dL Final  . Creat 12/01/2020 0.94  0.70 - 1.11 mg/dL Final   Comment: For patients >46 years of age, the reference limit for Creatinine is approximately 13% higher for people identified as African-American. .   . GFR, Est Non African American 12/01/2020 73  > OR = 60 mL/min/1.86m2 Final  . GFR, Est African American 12/01/2020 85  > OR = 60 mL/min/1.10m2 Final  . BUN/Creatinine Ratio 86/57/8469 NOT APPLICABLE  6 - 22 (calc) Final  . Sodium 12/01/2020 141  135 - 146 mmol/L Final  . Potassium 12/01/2020 3.5  3.5 - 5.3 mmol/L Final  . Chloride 12/01/2020 102  98 - 110 mmol/L Final  . CO2 12/01/2020 30  20 - 32 mmol/L Final  . Calcium 12/01/2020 9.4  8.6 - 10.3 mg/dL Final  . Total Protein 12/01/2020 6.3  6.1 - 8.1 g/dL Final  . Albumin 12/01/2020 4.0  3.6 - 5.1 g/dL Final  . Globulin 12/01/2020 2.3  1.9 - 3.7 g/dL (calc) Final  . AG Ratio 12/01/2020 1.7  1.0 - 2.5 (calc) Final  . Total Bilirubin 12/01/2020 2.0* 0.2 - 1.2 mg/dL Final  . Alkaline phosphatase (APISO) 12/01/2020 82  35 - 144 U/L Final  . AST 12/01/2020 25  10 - 35 U/L Final  . ALT 12/01/2020 18  9 - 46 U/L Final  . Cholesterol 12/01/2020 179  <200 mg/dL Final  . HDL 12/01/2020 62  > OR = 40 mg/dL Final  . Triglycerides 12/01/2020 58  <150 mg/dL Final  . LDL Cholesterol (Calc) 12/01/2020 103* mg/dL (calc) Final   Comment: Reference range: <100 . Desirable range <100 mg/dL for primary prevention;   <70 mg/dL for patients with CHD or diabetic patients  with > or = 2 CHD risk factors. Marland Kitchen LDL-C is now calculated using the Martin-Hopkins  calculation, which is a validated novel method providing  better accuracy than the Friedewald equation in the  estimation of LDL-C.  Cresenciano Genre et al. Annamaria Helling.  6295;284(13): 2061-2068  (http://education.[facility].com/faq/FAQ164)   . Total CHOL/HDL Ratio 12/01/2020 2.9  <5.0 (calc) Final  . Non-HDL Cholesterol (Calc) 12/01/2020 117  <130 mg/dL (calc) Final   Comment: For patients with diabetes plus 1 major ASCVD risk  factor, treating to a non-HDL-C goal of <100 mg/dL  (LDL-C of <70 mg/dL) is considered a therapeutic  option.   Appointment on 11/17/2020  Component Date Value Ref Range Status  . Area-P 1/2 11/17/2020 3.65  cm2 Final  . S' Lateral 11/17/2020 4.10  cm Final  . P 1/2 time  11/17/2020 713  msec Final     Past Medical History:  Diagnosis Date  . Anxiety   . Arthritis    "little bit; left knee"  . Atrial fibrillation (HCC)    Chronic  . Basal cell carcinoma   . Cataracts, bilateral    to have left eye cataract surgery on 03/21/17   . CHF (congestive heart failure), NYHA class II, chronic, diastolic ([place])   . Colon polyps    last colonoscopy 2015 (tubular adenoma)  . Depression   . Diverticulosis   . Gastritis   . GERD (gastroesophageal reflux disease)   . H/O: GI bleed    Recurrent  . Hemorrhoids   . History of BPH   . History of kidney stones    hx of x 1   . Hypertension   . Pernicious anemia   . PVC (premature ventricular contraction)   . Rectal bleeding 02/15/12  . Vitamin D deficiency    Past Surgical History:  Procedure Laterality Date  . COLONOSCOPY WITH PROPOFOL N/A 03/17/2017   Procedure: COLONOSCOPY WITH PROPOFOL;  Surgeon: Carol Ada, MD;  Location: [facility];  Service: Endoscopy;  Laterality: N/A;  . [place]; ~ 2002   left; right  . PACEMAKER IMPLANT N/A 03/11/2019   Procedure: PACEMAKER IMPLANT;  Surgeon: Constance Haw, MD;  Location: [facility];  Service: Cardiovascular;  Laterality: N/A;  . TEMPORARY PACEMAKER N/A 03/10/2019   Procedure: TEMPORARY PACEMAKER;  Surgeon: Martinique, Peter M, MD;  Location: [facility];  Service: Cardiovascular;   Laterality: N/A;   Current Outpatient Medications on File Prior to Visit  Medication Sig Dispense Refill  . albuterol (VENTOLIN HFA) 108 (90 Base) MCG/ACT inhaler INHALE 2 PUFFS EVERY 6 HOURS AS NEEDED FOR WHEEZING OR SHORTNESS OF BREATH 18 g 3  . amLODipine (NORVASC) 10 MG tablet TAKE 1 TABLET EVERY DAY 90 tablet 3  . carvedilol (COREG) 25 MG tablet Take 1 tablet (25 mg total) by mouth 2 (two) times daily. 180 tablet 1  . ferrous sulfate 325 (65 FE) MG tablet Take 325 mg by mouth every other day.     . fluticasone (FLONASE) 50 MCG/ACT nasal spray USE 1 SPRAY IN EACH NOSTRIL EVERY DAY 32 g 1  . furosemide (LASIX) 40 MG tablet Take 40 mg by mouth 2 (two) times daily.    Marland Kitchen losartan (COZAAR) 50 MG tablet TAKE 1 TABLET EVERY DAY 90 tablet 3  . Multiple Vitamin (MULTIVITAMIN) tablet Take 1 tablet by mouth daily.    . multivitamin-lutein (OCUVITE-LUTEIN) CAPS Take 1 capsule by mouth daily.    . potassium chloride (KLOR-CON) 10 MEQ tablet TAKE 1 TABLET EVERY DAY 90 tablet 3  . vitamin B-12 (CYANOCOBALAMIN) 1000 MCG tablet TAKE 1 TABLET EVERY DAY 90 tablet 3  . XARELTO 20 MG TABS tablet TAKE 1 TABLET EVERY DAY WITH SUPPER 90 tablet 3   Current Facility-Administered Medications on File Prior to Visit  Medication Dose Route Frequency Provider Last Rate Last Admin  . cyanocobalamin ((VITAMIN B-12)) injection 1,000 mcg  1,000 mcg Intramuscular Q30 days [name], MD   1,000 mcg at 03/22/19 1139   No Known Allergies Social History   Socioeconomic History  . Marital status: Widowed    Spouse name: Not on file  . Number of children: 1  . Years of education: Not on file  . Highest education level: Not on file  Occupational History  . Occupation: Retired  Tobacco  Use  . Smoking status: Never Smoker  . Smokeless tobacco: Former Systems developer    Types: Snuff  . Tobacco comment: 02/15/12 "still use snuff @ times; not daily"  Vaping Use  . Vaping Use: Never used  Substance and Sexual Activity  .  Alcohol use: No  . Drug use: No  . Sexual activity: Never    Comment: widower, lives with granddaughter and great grandchildren.  Other Topics Concern  . Not on file  Social History Narrative   Lives with grand daughter.         Epworth Sleepiness Scale = 7 (as of 09/21/2015)   Social Determinants of Health   Financial Resource Strain: Low Risk   . Difficulty of Paying Living Expenses: Not very hard  Food Insecurity: Not on file  Transportation Needs: Not on file  Physical Activity: Not on file  Stress: Not on file  Social Connections: Not on file  Intimate Partner Violence: Not on file     Review of Systems  All other systems reviewed and are negative.      Objective:   Physical Exam Vitals reviewed.  Constitutional:      General: He is not in acute distress.    Appearance: Normal appearance. He is normal weight. He is not ill-appearing, toxic-appearing or diaphoretic.  Cardiovascular:     Rate and Rhythm: Normal rate and regular rhythm.     Heart sounds: Murmur heard.   Systolic murmur is present with a grade of 3/6.   Pulmonary:     Effort: Pulmonary effort is normal.     Breath sounds: Normal breath sounds. No wheezing, rhonchi or rales.  Abdominal:     General: Abdomen is flat. Bowel sounds are normal. There is no distension.     Palpations: Abdomen is soft.     Tenderness: There is no abdominal tenderness. There is no guarding.  Musculoskeletal:     Right lower leg: No edema.     Left lower leg: No edema.  Skin:    Coloration: Skin is not jaundiced.  Neurological:     Mental Status: He is alert.           Assessment & Plan:  Essential hypertension  Chronic diastolic heart failure (HCC)  Heart block AV complete (HCC)  Atrial fibrillation, persistent ([place])  I am very happy with his blood work.  His blood sugar slightly elevated however given his advanced age, I do not feel that this warrants treatment at the present time.  He is appropriately  anticoagulated and his heart rate is well controlled.  There is no evidence of heart failure today on his exam.  He has some mild pedal edema but no evidence of pulmonary edema on his exam.  We spent more than 15 minutes discussing mitral regurgitation and treatment options.  Patient will await the results of his TEE and cardiology's advice.

## 2020-12-10 NOTE — Telephone Encounter (Signed)
Spoke with patient regarding need for updated visit. Patient preferred in office visit  Scheduled 6/6 TEE scheduled for June 8 at 12:30 pm

## 2020-12-15 ENCOUNTER — Telehealth: Payer: Self-pay | Admitting: Pharmacist

## 2020-12-15 NOTE — Telephone Encounter (Signed)
    Chronic Care Management   Name: Austin Gray.  MRN: [id number] DOB: 03/29/1932   Reason for Encounter: Chart Review   Conditions to be addressed/monitored: HTN, Afib, CHF, dyslipidemia.   Recent office visits:  12/08/20 ([place]) - no medication changes, blood sugar slightly elevated however appropriate for his age.  TEE scheduled for 12/23/20.  Recent consult visits:  None since last CCM call  Hospital visits:  None in previous 6 months  Medications: Outpatient Encounter Medications as of 12/15/2020  Medication Sig  . albuterol (VENTOLIN HFA) 108 (90 Base) MCG/ACT inhaler INHALE 2 PUFFS EVERY 6 HOURS AS NEEDED FOR WHEEZING OR SHORTNESS OF BREATH  . amLODipine (NORVASC) 10 MG tablet TAKE 1 TABLET EVERY DAY  . carvedilol (COREG) 25 MG tablet Take 1 tablet (25 mg total) by mouth 2 (two) times daily.  . ferrous sulfate 325 (65 FE) MG tablet Take 325 mg by mouth every other day.   . fluticasone (FLONASE) 50 MCG/ACT nasal spray USE 1 SPRAY IN EACH NOSTRIL EVERY DAY  . furosemide (LASIX) 40 MG tablet Take 40 mg by mouth 2 (two) times daily.  Marland Kitchen losartan (COZAAR) 50 MG tablet TAKE 1 TABLET EVERY DAY  . Multiple Vitamin (MULTIVITAMIN) tablet Take 1 tablet by mouth daily.  . multivitamin-lutein (OCUVITE-LUTEIN) CAPS Take 1 capsule by mouth daily.  . potassium chloride (KLOR-CON) 10 MEQ tablet TAKE 1 TABLET EVERY DAY  . vitamin B-12 (CYANOCOBALAMIN) 1000 MCG tablet TAKE 1 TABLET EVERY DAY  . XARELTO 20 MG TABS tablet TAKE 1 TABLET EVERY DAY WITH SUPPER   No facility-administered encounter medications on file as of 12/15/2020.    Care Gaps: Patient needs AWV.  Last AWV was 04-25-2016  Star Rating Drugs: Losartan 50mg  10/07/20 90 ds  Beverly Milch, PharmD Clinical Pharmacist [facility] [phone]

## 2020-12-20 NOTE — Progress Notes (Signed)
Cardiology Office Note   Date:  12/21/2020   ID:  Austin Mesi., DOB 05-10-1932, MRN [id number]  PCP:  [name], MD  Cardiologist:   Minus Breeding, MD   Chief Complaint  Patient presents with  . Edema      History of Present Illness: Austin Gray. is a 87 y.o. male who presents for evaluation of atrial fib and acute on chronic systolic and diastolic HF.  He was admitted on 09/05/2017 after he presented to his PCPs office with bradycardia and symptoms of volume overload.  Initial lab work also suggested acute kidney injury with creatinine elevation up to 1.5.  His heart rate was in the 30s in the ER.  His beta-blocker was held during the admission.  His ACE inhibitor was discontinued.  Echocardiogram obtained on 09/06/2017 showed mild LVH, EF 55-60%, moderate MR, moderate TR, PA peak pressure 36 mmHg.  He had normalization of the renal function by 09/15/2017.  He had CHB and a permanent pacemaker in August of 2020.    I sent him for a repeat echo last month and he had severe MR.   He comes back today to discuss this.  He probably would not want an open chest procedure but would like to know his options.  His shortness of breath happens when he is having lumbar back pain and trying to walk.  He is not describing PND or orthopnea.  He is not really noticing palpitations, presyncope or syncope.  He is walking with a walker because of his back pain.   Past Medical History:  Diagnosis Date  . Anxiety   . Arthritis    "little bit; left knee"  . Atrial fibrillation (HCC)    Chronic  . Basal cell carcinoma   . Cataracts, bilateral    to have left eye cataract surgery on 03/21/17   . CHF (congestive heart failure), NYHA class II, chronic, diastolic ([place])   . Colon polyps    last colonoscopy 2015 (tubular adenoma)  . Depression   . Diverticulosis   . Gastritis   . GERD (gastroesophageal reflux disease)   . H/O: GI bleed    Recurrent  . Hemorrhoids   . History of  BPH   . History of kidney stones    hx of x 1   . Hypertension   . Pernicious anemia   . PVC (premature ventricular contraction)   . Rectal bleeding 02/15/12  . Vitamin D deficiency     Past Surgical History:  Procedure Laterality Date  . COLONOSCOPY WITH PROPOFOL N/A 03/17/2017   Procedure: COLONOSCOPY WITH PROPOFOL;  Surgeon: Carol Ada, MD;  Location: [facility];  Service: Endoscopy;  Laterality: N/A;  . [place]; ~ 2002   left; right  . PACEMAKER IMPLANT N/A 03/11/2019   Procedure: PACEMAKER IMPLANT;  Surgeon: Constance Haw, MD;  Location: [facility];  Service: Cardiovascular;  Laterality: N/A;  . TEMPORARY PACEMAKER N/A 03/10/2019   Procedure: TEMPORARY PACEMAKER;  Surgeon: Martinique, Peter M, MD;  Location: [facility];  Service: Cardiovascular;  Laterality: N/A;     Current Outpatient Medications  Medication Sig Dispense Refill  . albuterol (VENTOLIN HFA) 108 (90 Base) MCG/ACT inhaler INHALE 2 PUFFS EVERY 6 HOURS AS NEEDED FOR WHEEZING OR SHORTNESS OF BREATH 18 g 3  . amLODipine (NORVASC) 10 MG tablet TAKE 1 TABLET EVERY DAY 90 tablet 3  . carvedilol (COREG) 25 MG tablet Take  1 tablet (25 mg total) by mouth 2 (two) times daily. 180 tablet 1  . ferrous sulfate 325 (65 FE) MG tablet Take 325 mg by mouth every other day.     . fluticasone (FLONASE) 50 MCG/ACT nasal spray USE 1 SPRAY IN EACH NOSTRIL EVERY DAY 32 g 1  . furosemide (LASIX) 40 MG tablet Take 40 mg by mouth 2 (two) times daily.    Marland Kitchen losartan (COZAAR) 50 MG tablet TAKE 1 TABLET EVERY DAY 90 tablet 3  . Multiple Vitamin (MULTIVITAMIN) tablet Take 1 tablet by mouth daily.    . multivitamin-lutein (OCUVITE-LUTEIN) CAPS Take 1 capsule by mouth daily.    . potassium chloride (KLOR-CON) 10 MEQ tablet TAKE 1 TABLET EVERY DAY 90 tablet 3  . vitamin B-12 (CYANOCOBALAMIN) 1000 MCG tablet TAKE 1 TABLET EVERY DAY 90 tablet 3  . XARELTO 20 MG TABS tablet TAKE 1 TABLET EVERY DAY WITH SUPPER  90 tablet 3   No current facility-administered medications for this visit.    Allergies:   Patient has no known allergies.    ROS:  Please see the history of present illness.   Otherwise, review of systems are positive for none.   All other systems are reviewed and negative.    PHYSICAL EXAM: VS:  BP 118/66 (BP Location: Left Arm, Patient Position: Sitting, Cuff Size: Normal)   Pulse 72   Ht 5\' 10"  (1.778 m)   Wt 220 lb (99.8 kg)   BMI 31.57 kg/m  , BMI Body mass index is 31.57 kg/m.  GENERAL:  Well appearing NECK:  No jugular venous distention, waveform within normal limits, carotid upstroke brisk and symmetric, no bruits, no thyromegaly LUNGS:  Clear to auscultation bilaterally CHEST:  Unremarkable HEART:  PMI not displaced or sustained,S1 and S2 within normal limits, no S3, no S4, no clicks, no rubs, 3 out of 6 holosystolic murmur heard at the apex and axilla, no diastolic murmurs ABD:  Flat, positive bowel sounds normal in frequency in pitch, no bruits, no rebound, no guarding, no midline pulsatile mass, no hepatomegaly, no splenomegaly EXT:  2 plus pulses throughout, no edema, no cyanosis no clubbing   EKG:  EKG is not ordered today.   Recent Labs: 12/01/2020: ALT 18; BUN 12; Creat 0.94; Hemoglobin 12.2; Platelets 104; Potassium 3.5; Sodium 141    Lipid Panel    Component Value Date/Time   CHOL 179 12/01/2020 0808   TRIG 58 12/01/2020 0808   HDL 62 12/01/2020 0808   CHOLHDL 2.9 12/01/2020 0808   VLDL 11 04/08/2016 0823   LDLCALC 103 (H) 12/01/2020 0808      Wt Readings from Last 3 Encounters:  12/21/20 220 lb (99.8 kg)  12/08/20 230 lb (104.3 kg)  09/22/20 223 lb (101.2 kg)      Other studies Reviewed: Additional studies/ records that were reviewed today include: Echo Review of the above records demonstrates: See elsewhere   ASSESSMENT AND PLAN:  ATRIAL FIB:    CHA2DS2-VASc Score and unadjusted Ischemic Stroke Rate (% per year) is equal to 3.2 % stroke  rate/year from a score of 3.   He tolerates anticoagulation.  No change in therapy.  MR:  This is now severe and he needs a TEE.  We had a long discussion about this today.  He is very clear and understands.  He would like to have more information about the options and so does consent to the TEE.  CHB/PERMANENT PACEMAKER:    He had normal device  function at the last interrogation.    EDEMA:     This is mild to moderate and chronic.  No change in therapy.   HTN:   The blood pressure is at target.  No change in therapy.  CHRONIC DIASTOLIC HF:     He seems to be euvolemic today.  No change in therapy.   Current medicines are reviewed at length with the patient today.  The patient does not have concerns regarding medicines.  The following changes have been made: None   Labs/ tests ordered today include:    Orders Placed This Encounter  Procedures  . CBC with Differential/Platelet  . Basic metabolic panel     Disposition:   FU with me in 3 months.    Signed, Minus Breeding, MD  12/21/2020 9:56 AM    [facility]

## 2020-12-20 NOTE — H&P (View-Only) (Signed)
Cardiology Office Note   Date:  12/21/2020   ID:  Austin Mesi., DOB [birth year], MRN [id number]  PCP:  [name], MD  Cardiologist:   Minus Breeding, MD   Chief Complaint  Patient presents with  . Edema      History of Present Illness: Austin Irby. is a 87 y.o. male who presents for evaluation of atrial fib and acute on chronic systolic and diastolic HF.  He was admitted on 09/05/2017 after he presented to his PCPs office with bradycardia and symptoms of volume overload.  Initial lab work also suggested acute kidney injury with creatinine elevation up to 1.5.  His heart rate was in the 30s in the ER.  His beta-blocker was held during the admission.  His ACE inhibitor was discontinued.  Echocardiogram obtained on 09/06/2017 showed mild LVH, EF 55-60%, moderate MR, moderate TR, PA peak pressure 36 mmHg.  He had normalization of the renal function by 09/15/2017.  He had CHB and a permanent pacemaker in August of 2020.    I sent him for a repeat echo last month and he had severe MR.   He comes back today to discuss this.  He probably would not want an open chest procedure but would like to know his options.  His shortness of breath happens when he is having lumbar back pain and trying to walk.  He is not describing PND or orthopnea.  He is not really noticing palpitations, presyncope or syncope.  He is walking with a walker because of his back pain.   Past Medical History:  Diagnosis Date  . Anxiety   . Arthritis    "little bit; left knee"  . Atrial fibrillation (HCC)    Chronic  . Basal cell carcinoma   . Cataracts, bilateral    to have left eye cataract surgery on 03/21/17   . CHF (congestive heart failure), NYHA class II, chronic, diastolic ([place])   . Colon polyps    last colonoscopy 2015 (tubular adenoma)  . Depression   . Diverticulosis   . Gastritis   . GERD (gastroesophageal reflux disease)   . H/O: GI bleed    Recurrent  . Hemorrhoids   . History of  BPH   . History of kidney stones    hx of x 1   . Hypertension   . Pernicious anemia   . PVC (premature ventricular contraction)   . Rectal bleeding 02/15/12  . Vitamin D deficiency     Past Surgical History:  Procedure Laterality Date  . COLONOSCOPY WITH PROPOFOL N/A 03/17/2017   Procedure: COLONOSCOPY WITH PROPOFOL;  Surgeon: Carol Ada, MD;  Location: [facility];  Service: Endoscopy;  Laterality: N/A;  . [place]; ~ 2002   left; right  . PACEMAKER IMPLANT N/A 03/11/2019   Procedure: PACEMAKER IMPLANT;  Surgeon: Constance Haw, MD;  Location: [place] [facility];  Service: Cardiovascular;  Laterality: N/A;  . TEMPORARY PACEMAKER N/A 03/10/2019   Procedure: TEMPORARY PACEMAKER;  Surgeon: Martinique, Peter M, MD;  Location: [facility];  Service: Cardiovascular;  Laterality: N/A;     Current Outpatient Medications  Medication Sig Dispense Refill  . albuterol (VENTOLIN HFA) 108 (90 Base) MCG/ACT inhaler INHALE 2 PUFFS EVERY 6 HOURS AS NEEDED FOR WHEEZING OR SHORTNESS OF BREATH 18 g 3  . amLODipine (NORVASC) 10 MG tablet TAKE 1 TABLET EVERY DAY 90 tablet 3  . carvedilol (COREG) 25 MG tablet Take  1 tablet (25 mg total) by mouth 2 (two) times daily. 180 tablet 1  . ferrous sulfate 325 (65 FE) MG tablet Take 325 mg by mouth every other day.     . fluticasone (FLONASE) 50 MCG/ACT nasal spray USE 1 SPRAY IN EACH NOSTRIL EVERY DAY 32 g 1  . furosemide (LASIX) 40 MG tablet Take 40 mg by mouth 2 (two) times daily.    Marland Kitchen losartan (COZAAR) 50 MG tablet TAKE 1 TABLET EVERY DAY 90 tablet 3  . Multiple Vitamin (MULTIVITAMIN) tablet Take 1 tablet by mouth daily.    . multivitamin-lutein (OCUVITE-LUTEIN) CAPS Take 1 capsule by mouth daily.    . potassium chloride (KLOR-CON) 10 MEQ tablet TAKE 1 TABLET EVERY DAY 90 tablet 3  . vitamin B-12 (CYANOCOBALAMIN) 1000 MCG tablet TAKE 1 TABLET EVERY DAY 90 tablet 3  . XARELTO 20 MG TABS tablet TAKE 1 TABLET EVERY DAY WITH SUPPER  90 tablet 3   No current facility-administered medications for this visit.    Allergies:   Patient has no known allergies.    ROS:  Please see the history of present illness.   Otherwise, review of systems are positive for none.   All other systems are reviewed and negative.    PHYSICAL EXAM: VS:  BP 118/66 (BP Location: Left Arm, Patient Position: Sitting, Cuff Size: Normal)   Pulse 72   Ht 5\' 10"  (1.778 m)   Wt 220 lb (99.8 kg)   BMI 31.57 kg/m  , BMI Body mass index is 31.57 kg/m.  GENERAL:  Well appearing NECK:  No jugular venous distention, waveform within normal limits, carotid upstroke brisk and symmetric, no bruits, no thyromegaly LUNGS:  Clear to auscultation bilaterally CHEST:  Unremarkable HEART:  PMI not displaced or sustained,S1 and S2 within normal limits, no S3, no S4, no clicks, no rubs, 3 out of 6 holosystolic murmur heard at the apex and axilla, no diastolic murmurs ABD:  Flat, positive bowel sounds normal in frequency in pitch, no bruits, no rebound, no guarding, no midline pulsatile mass, no hepatomegaly, no splenomegaly EXT:  2 plus pulses throughout, no edema, no cyanosis no clubbing   EKG:  EKG is not ordered today.   Recent Labs: 12/01/2020: ALT 18; BUN 12; Creat 0.94; Hemoglobin 12.2; Platelets 104; Potassium 3.5; Sodium 141    Lipid Panel    Component Value Date/Time   CHOL 179 12/01/2020 0808   TRIG 58 12/01/2020 0808   HDL 62 12/01/2020 0808   CHOLHDL 2.9 12/01/2020 0808   VLDL 11 04/08/2016 0823   LDLCALC 103 (H) 12/01/2020 0808      Wt Readings from Last 3 Encounters:  12/21/20 220 lb (99.8 kg)  12/08/20 230 lb (104.3 kg)  09/22/20 223 lb (101.2 kg)      Other studies Reviewed: Additional studies/ records that were reviewed today include: Echo Review of the above records demonstrates: See elsewhere   ASSESSMENT AND PLAN:  ATRIAL FIB:    CHA2DS2-VASc Score and unadjusted Ischemic Stroke Rate (% per year) is equal to 3.2 % stroke  rate/year from a score of 3.   He tolerates anticoagulation.  No change in therapy.  MR:  This is now severe and he needs a TEE.  We had a long discussion about this today.  He is very clear and understands.  He would like to have more information about the options and so does consent to the TEE.  CHB/PERMANENT PACEMAKER:    He had normal device  function at the last interrogation.    EDEMA:     This is mild to moderate and chronic.  No change in therapy.   HTN:   The blood pressure is at target.  No change in therapy.  CHRONIC DIASTOLIC HF:     He seems to be euvolemic today.  No change in therapy.   Current medicines are reviewed at length with the patient today.  The patient does not have concerns regarding medicines.  The following changes have been made: None   Labs/ tests ordered today include:    Orders Placed This Encounter  Procedures  . CBC with Differential/Platelet  . Basic metabolic panel     Disposition:   FU with me in 3 months.    Signed, Minus Breeding, MD  12/21/2020 9:56 AM    [facility]

## 2020-12-21 ENCOUNTER — Ambulatory Visit: Payer: Medicare HMO | Admitting: Cardiology

## 2020-12-21 ENCOUNTER — Encounter: Payer: Self-pay | Admitting: Cardiology

## 2020-12-21 ENCOUNTER — Other Ambulatory Visit: Payer: Self-pay

## 2020-12-21 VITALS — BP 118/66 | HR 72 | Ht 70.0 in | Wt 220.0 lb

## 2020-12-21 DIAGNOSIS — Z01818 Encounter for other preprocedural examination: Secondary | ICD-10-CM

## 2020-12-21 DIAGNOSIS — I340 Nonrheumatic mitral (valve) insufficiency: Secondary | ICD-10-CM

## 2020-12-21 DIAGNOSIS — Z01812 Encounter for preprocedural laboratory examination: Secondary | ICD-10-CM | POA: Diagnosis not present

## 2020-12-21 LAB — BASIC METABOLIC PANEL
BUN/Creatinine Ratio: 13 (ref 10–24)
BUN: 12 mg/dL (ref 8–27)
CO2: 26 mmol/L (ref 20–29)
Calcium: 9.3 mg/dL (ref 8.6–10.2)
Chloride: 99 mmol/L (ref 96–106)
Creatinine, Ser: 0.96 mg/dL (ref 0.76–1.27)
Glucose: 113 mg/dL — ABNORMAL HIGH (ref 65–99)
Potassium: 4 mmol/L (ref 3.5–5.2)
Sodium: 141 mmol/L (ref 134–144)
eGFR: 77 mL/min/{1.73_m2} (ref >59)

## 2020-12-21 LAB — CBC WITH DIFFERENTIAL/PLATELET
Basophils Absolute: 0.1 10*3/uL (ref 0.0–0.2)
Basos: 1 %
EOS (ABSOLUTE): 0.1 10*3/uL (ref 0.0–0.4)
Eos: 2 %
Hematocrit: 39.4 % (ref 37.5–51.0)
Hemoglobin: 13 g/dL (ref 13.0–17.7)
Immature Grans (Abs): 0 10*3/uL (ref 0.0–0.1)
Immature Granulocytes: 0 %
Lymphocytes Absolute: 1.2 10*3/uL (ref 0.7–3.1)
Lymphs: 16 %
MCH: 28.4 pg (ref 26.6–33.0)
MCHC: 33 g/dL (ref 31.5–35.7)
MCV: 86 fL (ref 79–97)
Monocytes Absolute: 0.6 10*3/uL (ref 0.1–0.9)
Monocytes: 8 %
Neutrophils Absolute: 5.6 10*3/uL (ref 1.4–7.0)
Neutrophils: 73 %
Platelets: 121 10*3/uL — ABNORMAL LOW (ref 150–450)
RBC: 4.57 x10E6/uL (ref 4.14–5.80)
RDW: 13.7 % (ref 11.6–15.4)
WBC: 7.6 10*3/uL (ref 3.4–10.8)

## 2020-12-21 NOTE — Patient Instructions (Addendum)
Medication Instructions:  Your physician recommends that you continue on your current medications as directed. Please refer to the Current Medication list given to you today.   *If you need a refill on your cardiac medications before your next appointment, please call your pharmacy*  Lab Work: CBC/BMET TODAY   If you have labs (blood work) drawn today and your tests are completely normal, you will receive your results only by: Marland Kitchen MyChart Message (if you have MyChart) OR . A paper copy in the mail If you have any lab test that is abnormal or we need to change your treatment, we will call you to review the results.  Testing/Procedures: Your physician has requested that you have a TEE. During a TEE, sound waves are used to create images of your heart. It provides your doctor with information about the size and shape of your heart and how well your heart's chambers and valves are working. In this test, a transducer is attached to the end of a flexible tube that's guided down your throat and into your esophagus (the tube leading from you mouth to your stomach) to get a more detailed image of your heart. You are not awake for the procedure. Please see the instruction sheet given to you today. For further information please visit [email or web address].  Follow-Up: At [facility], you and your health needs are our priority.  As part of our continuing mission to provide you with exceptional heart care, we have created designated Provider Care Teams.  These Care Teams include your primary Cardiologist (physician) and Advanced Practice Providers (APPs -  Physician Assistants and Nurse Practitioners) who all work together to provide you with the care you need, when you need it.  We recommend signing up for the patient portal called "MyChart".  Sign up information is provided on this After Visit Summary.  MyChart is used to connect with patients for Virtual Visits (Telemedicine).  Patients are able to view  lab/test results, encounter notes, upcoming appointments, etc.  Non-urgent messages can be sent to your provider as well.   To learn more about what you can do with MyChart, go to [email or web address].    Your next appointment:   3 month(s)  The format for your next appointment:   In Person  Provider:   You may see Minus Breeding, MD or one of the following Advanced Practice Providers on your designated Care Team:    Rosaria Ferries, PA-C  Jory Sims, DNP, ANP  Other Instructions  You are scheduled for a TEE on 12/23/2020 with Dr. Johney Frame.  Please arrive at the [facility] (Main Entrance A) at [facility]: [address] [place], [place] [address] at 11:15 am. (1 hour prior to procedure unless lab work is needed; if lab work is needed arrive 1.5 hours ahead)  DIET: Nothing to eat or drink after midnight except a sip of water with medications (see medication instructions below)  Medication Instructions: Hold FUROSEMIDE (LASIX) Hoffman will need to continue your anticoagulant after your procedure until you are told by your  Provider that it is safe to stop  Labs:  CBC/BMET TODAY   You must have a responsible person to drive you home and stay in the waiting area during your procedure. Failure to do so could result in cancellation.  Bring your insurance cards.  *Special Note: Every effort is made to have your procedure done on time. Occasionally there are emergencies that occur at the hospital that may cause  delays. Please be patient if a delay does occur.    Transesophageal Echocardiogram Transesophageal echocardiogram (TEE) is a test that uses sound waves to take pictures of your heart. TEE is done by passing a small probe attached to a flexible tube down the part of the body that moves food from your mouth to your stomach (esophagus). The pictures give clear images of your heart. This can help your doctor see if there are problems with your heart. Tell  a doctor about:  Any allergies you have.  All medicines you are taking. This includes vitamins, herbs, eye drops, creams, and over-the-counter medicines.  Any problems you or family members have had with anesthetic medicines.  Any blood disorders you have.  Any surgeries you have had.  Any medical conditions you have.  Any swallowing problems.  Whether you have or have had a blockage in the part of the body that moves food from your mouth to your stomach.  Whether you are pregnant or may be pregnant. What are the risks? In general, this is a safe procedure. But, problems may occur, such as:  Damage to nearby structures or organs.  A tear in the part of the body that moves food from your mouth to your stomach.  Irregular heartbeat.  Hoarse voice or trouble swallowing.  Bleeding. What happens before the procedure? Medicines  Ask your doctor about changing or stopping: ? Your normal medicines. ? Vitamins, herbs, and supplements. ? Over-the-counter medicines.  Do not take aspirin or ibuprofen unless you are told to. General instructions  Follow instructions from your doctor about what you cannot eat or drink.  You will take out any dentures or dental retainers.  Plan to have a responsible adult take you home from the hospital or clinic.  Plan to have a responsible adult care for you for the time you are told after you leave the hospital or clinic. This is important. What happens during the procedure?  An IV will be put into one of your veins.  You may be given: ? A sedative. This medicine helps you relax. ? A medicine to numb the back of your throat. This may be sprayed or gargled.  Your blood pressure, heart rate, and breathing will be watched.  You may be asked to lie on your left side.  A bite block will be placed in your mouth. This keeps you from biting the tube.  The tip of the probe will be placed into the back of your mouth.  You will be asked to  swallow.  Your doctor will take pictures of your heart.  The probe and bite block will be taken out after the test is done. The procedure may vary among doctors and hospitals.   What can I expect after the procedure?  You will be monitored until you leave the hospital or clinic. This includes checking your blood pressure, heart rate, breathing rate, and blood oxygen level.  Your throat may feel sore and numb. This will get better over time. You will not be allowed to eat or drink until the numbness has gone away.  It is common to have a sore throat for a day or two.  It is up to you to get the results of your procedure. Ask how to get your results when they are ready. Follow these instructions at home:  If you were given a sedative during your procedure, do not drive or use machines until your doctor says that it is safe.  Return to your normal activities when your doctor says that it is safe.  Keep all follow-up visits. Summary  TEE is a test that uses sound waves to take pictures of your heart.  You will be given a medicine to help you relax.  Do not drive or use machines until your doctor says that it is safe. This information is not intended to replace advice given to you by your health care provider. Make sure you discuss any questions you have with your health care provider. Document Revised: 02/25/2020 Document Reviewed: 02/25/2020 Elsevier Patient Education  2021 [employer].

## 2020-12-22 ENCOUNTER — Other Ambulatory Visit: Payer: Self-pay | Admitting: *Deleted

## 2020-12-22 DIAGNOSIS — I340 Nonrheumatic mitral (valve) insufficiency: Secondary | ICD-10-CM

## 2020-12-23 ENCOUNTER — Ambulatory Visit (HOSPITAL_COMMUNITY): Payer: Medicare HMO | Admitting: Anesthesiology

## 2020-12-23 ENCOUNTER — Other Ambulatory Visit: Payer: Self-pay

## 2020-12-23 ENCOUNTER — Ambulatory Visit (HOSPITAL_BASED_OUTPATIENT_CLINIC_OR_DEPARTMENT_OTHER): Payer: Medicare HMO

## 2020-12-23 ENCOUNTER — Encounter (HOSPITAL_COMMUNITY): Payer: Self-pay | Admitting: Cardiology

## 2020-12-23 ENCOUNTER — Ambulatory Visit (INDEPENDENT_AMBULATORY_CARE_PROVIDER_SITE_OTHER): Payer: Medicare HMO

## 2020-12-23 ENCOUNTER — Encounter (HOSPITAL_COMMUNITY)
Admission: RE | Disposition: A | Discharge status: Home / Self Care | Payer: Self-pay | Source: Home / Self Care | Attending: Cardiology

## 2020-12-23 ENCOUNTER — Ambulatory Visit (HOSPITAL_COMMUNITY)
Admission: RE | Admit: 2020-12-23 | Discharge: 2020-12-23 | Disposition: A | Discharge status: Home / Self Care | Payer: Medicare HMO | Attending: Cardiology | Admitting: Cardiology

## 2020-12-23 DIAGNOSIS — I340 Nonrheumatic mitral (valve) insufficiency: Secondary | ICD-10-CM

## 2020-12-23 DIAGNOSIS — I083 Combined rheumatic disorders of mitral, aortic and tricuspid valves: Secondary | ICD-10-CM | POA: Diagnosis not present

## 2020-12-23 DIAGNOSIS — Z79899 Other long term (current) drug therapy: Secondary | ICD-10-CM | POA: Diagnosis not present

## 2020-12-23 DIAGNOSIS — R609 Edema, unspecified: Secondary | ICD-10-CM | POA: Diagnosis not present

## 2020-12-23 DIAGNOSIS — Z950 Presence of cardiac pacemaker: Secondary | ICD-10-CM | POA: Diagnosis not present

## 2020-12-23 DIAGNOSIS — Z7901 Long term (current) use of anticoagulants: Secondary | ICD-10-CM | POA: Insufficient documentation

## 2020-12-23 DIAGNOSIS — I442 Atrioventricular block, complete: Secondary | ICD-10-CM

## 2020-12-23 DIAGNOSIS — I4891 Unspecified atrial fibrillation: Secondary | ICD-10-CM | POA: Diagnosis not present

## 2020-12-23 DIAGNOSIS — I110 Hypertensive heart disease with heart failure: Secondary | ICD-10-CM | POA: Insufficient documentation

## 2020-12-23 DIAGNOSIS — I5032 Chronic diastolic (congestive) heart failure: Secondary | ICD-10-CM | POA: Insufficient documentation

## 2020-12-23 DIAGNOSIS — E871 Hypo-osmolality and hyponatremia: Secondary | ICD-10-CM | POA: Diagnosis not present

## 2020-12-23 HISTORY — PX: TEE WITHOUT CARDIOVERSION: SHX5443

## 2020-12-23 LAB — CUP PACEART REMOTE DEVICE CHECK
Battery Remaining Longevity: 112 mo
Battery Remaining Percentage: 95.5 %
Battery Voltage: 3.01 V
Brady Statistic RV Percent Paced: 94 %
Date Time Interrogation Session: 20220608020014
Implantable Lead Implant Date: 20200824
Implantable Lead Location: 753860
Implantable Pulse Generator Implant Date: 20200824
Lead Channel Impedance Value: 440 Ohm
Lead Channel Pacing Threshold Amplitude: 1 V
Lead Channel Pacing Threshold Pulse Width: 0.5 ms
Lead Channel Sensing Intrinsic Amplitude: 9 mV
Lead Channel Setting Pacing Amplitude: 2.5 V
Lead Channel Setting Pacing Pulse Width: 0.5 ms
Lead Channel Setting Sensing Sensitivity: 0.7 mV
Pulse Gen Model: 1272
Pulse Gen Serial Number: 9140133

## 2020-12-23 LAB — ECHO TEE
MV M vel: 5.58 m/s
MV Peak grad: 124.5 mmHg
Radius: 1 cm

## 2020-12-23 SURGERY — ECHOCARDIOGRAM, TRANSESOPHAGEAL
Anesthesia: Monitor Anesthesia Care

## 2020-12-23 MED ORDER — SODIUM CHLORIDE 0.9 % IV SOLN
INTRAVENOUS | Status: AC | PRN
  Administered 2020-12-23: 500 mL via INTRAVENOUS

## 2020-12-23 MED ORDER — PROPOFOL 500 MG/50ML IV EMUL
INTRAVENOUS | Status: DC | PRN
  Administered 2020-12-23: 200 ug/kg/min via INTRAVENOUS

## 2020-12-23 MED ORDER — PHENYLEPHRINE 40 MCG/ML (10ML) SYRINGE FOR IV PUSH (FOR BLOOD PRESSURE SUPPORT)
PREFILLED_SYRINGE | INTRAVENOUS | Status: DC | PRN
  Administered 2020-12-23: 80 ug via INTRAVENOUS

## 2020-12-23 MED ORDER — SODIUM CHLORIDE 0.9 % IV SOLN
INTRAVENOUS | Status: DC
Start: 2020-12-23 — End: 2020-12-23

## 2020-12-23 NOTE — Interval H&P Note (Signed)
History and Physical Interval Note:  12/23/2020 12:26 PM  Austin Gray.  has presented today for surgery, with the diagnosis of [place].  The various methods of treatment have been discussed with the patient and family. After consideration of risks, benefits and other options for treatment, the patient has consented to  Procedure(s): TRANSESOPHAGEAL ECHOCARDIOGRAM (TEE) (N/A) as a surgical intervention.  The patient's history has been reviewed, patient examined, no change in status, stable for surgery.  I have reviewed the patient's chart and labs.  Questions were answered to the patient's satisfaction.     Freada Bergeron

## 2020-12-23 NOTE — Transfer of Care (Signed)
Immediate Anesthesia Transfer of Care Note  Patient: Austin Gray.  Procedure(s) Performed: TRANSESOPHAGEAL ECHOCARDIOGRAM (TEE) (N/A )  Patient Location: Endoscopy Unit  Anesthesia Type:MAC  Level of Consciousness: awake  Airway & Oxygen Therapy: Patient Spontanous Breathing and Patient connected to face mask oxygen  Post-op Assessment: Report given to RN and Post -op Vital signs reviewed and stable  Post vital signs: Reviewed and stable  Last Vitals:  Vitals Value Taken Time  BP 84/49 12/23/20 1426  Temp    Pulse 73 12/23/20 1426  Resp 22 12/23/20 1426  SpO2 95 % 12/23/20 1426  Vitals shown include unvalidated device data.  Last Pain:  Vitals:   12/23/20 1210  TempSrc: Oral  PainSc: 0-No pain         Complications: No complications documented.

## 2020-12-23 NOTE — Progress Notes (Signed)
  Echocardiogram Echocardiogram Transesophageal has been performed.  Fidel Levy 12/23/2020, 2:53 PM

## 2020-12-23 NOTE — Anesthesia Postprocedure Evaluation (Signed)
Anesthesia Post Note  Patient: Austin Gray.  Procedure(s) Performed: TRANSESOPHAGEAL ECHOCARDIOGRAM (TEE) (N/A )     Patient location during evaluation: PACU Anesthesia Type: MAC Level of consciousness: awake and alert Pain management: pain level controlled Vital Signs Assessment: post-procedure vital signs reviewed and stable Respiratory status: spontaneous breathing, nonlabored ventilation, respiratory function stable and patient connected to nasal cannula oxygen Cardiovascular status: stable and blood pressure returned to baseline Postop Assessment: no apparent nausea or vomiting Anesthetic complications: no   No complications documented.  Last Vitals:  Vitals:   12/23/20 1447 12/23/20 1457  BP: 131/79 135/76  Pulse: 72 70  Resp: (!) 32 (!) 23  Temp:    SpO2: 94% 96%    Last Pain:  Vitals:   12/23/20 1457  TempSrc:   PainSc: 0-No pain                 Effie Berkshire

## 2020-12-23 NOTE — Anesthesia Preprocedure Evaluation (Addendum)
Anesthesia Evaluation  Patient identified by MRN, date of birth, ID band Patient awake    Reviewed: Allergy & Precautions, NPO status , Patient's Chart, lab work & pertinent test results  Airway Mallampati: II  TM Distance: >3 FB     Dental  (+) Missing, Dental Advisory Given, Poor Dentition, Chipped,    Pulmonary    breath sounds clear to auscultation       Cardiovascular hypertension, +CHF  + dysrhythmias Atrial Fibrillation  Rhythm:Regular Rate:Normal + Systolic murmurs paced   Neuro/Psych PSYCHIATRIC DISORDERS Anxiety Depression    GI/Hepatic Neg liver ROS, GERD  ,  Endo/Other  negative endocrine ROS  Renal/GU Renal disease     Musculoskeletal  (+) Arthritis ,   Abdominal   Peds  Hematology negative hematology ROS (+)   Anesthesia Other Findings   Reproductive/Obstetrics                           Anesthesia Physical Anesthesia Plan  ASA: III  Anesthesia Plan: MAC   Post-op Pain Management:    Induction: Intravenous  PONV Risk Score and Plan: 0 and Propofol infusion  Airway Management Planned: Nasal Cannula and Natural Airway  Additional Equipment: None  Intra-op Plan:   Post-operative Plan:   Informed Consent: I have reviewed the patients History and Physical, chart, labs and discussed the procedure including the risks, benefits and alternatives for the proposed anesthesia with the patient or authorized representative who has indicated his/her understanding and acceptance.     Dental advisory given  Plan Discussed with: CRNA  Anesthesia Plan Comments:        Anesthesia Quick Evaluation

## 2020-12-23 NOTE — Procedures (Signed)
     Transesophageal Echocardiogram Note  Austin Gray [id number] Feb 19, 1932  Procedure: Transesophageal Echocardiogram Indications: Severe MR  Procedure Details Consent: Obtained Time Out: Verified patient identification, verified procedure, site/side was marked, verified correct patient position, special equipment/implants available, Radiology Safety Procedures followed,  medications/allergies/relevent history reviewed, required imaging and test results available.  Performed  Medications: Propofol: 240mg  administered by CV anesthesia  Left Ventrical:  LVEF 55-60%  Mitral Valve: Myxomatous with partial flail of P2 and severe, anteriorly directed mitral regurgitation  Aortic Valve: Tricuspid, mild calcification, trace-to-mild AI, no AS  Tricuspid Valve: Normal structure. Severe TR  Pulmonic Valve: Grossly normal, trivial PI  Left Atrium/ Left atrial appendage: Severely dilated, no LAA thrombus  Atrial septum: No shunting seen by color doppler  Aorta: Grade III plaque   Complications: No apparent complications Patient did tolerate procedure well.  Freada Bergeron, MD 12/23/2020, 2:26 PM

## 2020-12-24 ENCOUNTER — Encounter (HOSPITAL_COMMUNITY): Payer: Self-pay | Admitting: Cardiology

## 2020-12-24 ENCOUNTER — Other Ambulatory Visit: Payer: Self-pay | Admitting: *Deleted

## 2020-12-24 DIAGNOSIS — I340 Nonrheumatic mitral (valve) insufficiency: Secondary | ICD-10-CM

## 2020-12-28 NOTE — Progress Notes (Signed)
Filippo Puls DOB 01/13/1932  This looks like a clippable valve. The fossa looks approachable for transseptal puncture in the SAXB view. TR and pacing leads are noted. LA dimensions are large enough for device steering and straddle. MR jet is caused by a posterior prolapse/flail. The posterior leaflet is measures about 1 cm in the 119 LVOT grasping view. I'd like to verify both gradient and MVA in the procedure prior to the start. Based on this information, I would recommend either an NTW or XTW and assess for gradient.  Thank you for uploading!

## 2020-12-30 ENCOUNTER — Other Ambulatory Visit: Payer: Self-pay

## 2020-12-30 ENCOUNTER — Encounter: Payer: Self-pay | Admitting: Cardiovascular Disease

## 2020-12-30 ENCOUNTER — Ambulatory Visit: Payer: Medicare HMO | Admitting: Cardiovascular Disease

## 2020-12-30 VITALS — BP 130/74 | HR 70 | Ht 70.0 in | Wt 224.8 lb

## 2020-12-30 DIAGNOSIS — I340 Nonrheumatic mitral (valve) insufficiency: Secondary | ICD-10-CM | POA: Diagnosis not present

## 2020-12-30 NOTE — Progress Notes (Signed)
[facility]   MULTIDISCIPLINARY HEART VALVE TEAM  Date:  01/01/2021   ID:  Austin Mesi., DOB June 10, 1932, MRN [id number]  PCP:  [name], MD   Chief Complaint  Patient presents with   Mitral Regurgitation      HISTORY OF PRESENT ILLNESS: Austin Kueker. is a 87 y.o. male who presents for evaluation of mitral regurgitation, referred by Dr Percival Spanish.  The patient is here with his granddaughter today who he raised "as his daughter."  He has recently been diagnosed with severe mitral regurgitation and treatment options have been discussed with him.  He presents for consideration of transcatheter edge-to-edge mitral valve repair.  Comorbid medical conditions include chronic persistent atrial fibrillation on oral anticoagulation, complete heart block with history of permanent pacemaker placement, hypertension, and chronic diastolic heart failure.  The patient lives independently.  He is most limited by back pain and leg weakness with walking.  He has mild shortness of breath but this is not particularly lifestyle limiting.  He has no resting chest pain or shortness of breath.  He denies orthopnea or PND.  He does have chronic leg swelling which is stable over recent months.  The patient is in a wheelchair today as he is unable to walk any distance outside of the house.   Past Medical History:  Diagnosis Date   Anxiety    Arthritis    "little bit; left knee"   Atrial fibrillation (HCC)    Chronic   Basal cell carcinoma    Cataracts, bilateral    to have left eye cataract surgery on 03/21/17    CHF (congestive heart failure), NYHA class II, chronic, diastolic (HCC)    Colon polyps    last colonoscopy 2015 (tubular adenoma)   Depression    Diverticulosis    Gastritis    GERD (gastroesophageal reflux disease)    H/O: GI bleed    Recurrent   Hemorrhoids    History of BPH    History of kidney stones    hx of x 1    Hypertension    Pernicious anemia     PVC (premature ventricular contraction)    Rectal bleeding 02/15/12   Vitamin D deficiency     Current Outpatient Medications  Medication Sig Dispense Refill   albuterol (VENTOLIN HFA) 108 (90 Base) MCG/ACT inhaler INHALE 2 PUFFS EVERY 6 HOURS AS NEEDED FOR WHEEZING OR SHORTNESS OF BREATH 18 g 3   amLODipine (NORVASC) 10 MG tablet TAKE 1 TABLET EVERY DAY 90 tablet 3   carvedilol (COREG) 25 MG tablet Take 1 tablet (25 mg total) by mouth 2 (two) times daily. 180 tablet 1   ferrous sulfate 325 (65 FE) MG tablet Take 325 mg by mouth every other day.      fluticasone (FLONASE) 50 MCG/ACT nasal spray USE 1 SPRAY IN EACH NOSTRIL EVERY DAY 32 g 1   furosemide (LASIX) 40 MG tablet Take 40 mg by mouth 2 (two) times daily.     losartan (COZAAR) 50 MG tablet TAKE 1 TABLET EVERY DAY 90 tablet 3   Multiple Vitamin (MULTIVITAMIN) tablet Take 1 tablet by mouth daily.     multivitamin-lutein (OCUVITE-LUTEIN) CAPS Take 1 capsule by mouth daily.     potassium chloride (KLOR-CON) 10 MEQ tablet TAKE 1 TABLET EVERY DAY 90 tablet 3   vitamin B-12 (CYANOCOBALAMIN) 1000 MCG tablet TAKE 1 TABLET EVERY DAY 90 tablet 3   XARELTO 20 MG TABS tablet TAKE 1  TABLET EVERY DAY WITH SUPPER 90 tablet 3   No current facility-administered medications for this visit.    ALLERGIES:   Patient has no known allergies.   SOCIAL HISTORY:  The patient  reports that he has never smoked. He quit smokeless tobacco use about 2 years ago.  His smokeless tobacco use included snuff. He reports that he does not drink alcohol and does not use drugs.   FAMILY HISTORY:  The patient's family history includes Cancer in his mother; Stroke (age of onset: 26) in his father.   REVIEW OF SYSTEMS:  Positive for hearing loss, poor vision, back pain, leg pain and weakness.   All other systems are reviewed and negative.   PHYSICAL EXAM: VS:  BP 130/74   Pulse 70   Ht 5\' 10"  (1.778 m)   Wt 224 lb 12.8 oz (102 kg)   SpO2 95%   BMI 32.26 kg/m  ,  BMI Body mass index is 32.26 kg/m. GEN: elderly, overweight male, in a wheelchair, in no acute distress HEENT: normal Neck: JVP elevated. carotids 2+ without bruits or masses Cardiac: The heart is RRR with a 3/6 holosystolic murmur at the apex. 1+ bilateral pretibial edema.  Respiratory:  clear to auscultation bilaterally GI: soft, nontender, nondistended, + BS MS: no deformity or atrophy Skin: warm and dry, no rash Neuro:  Strength and sensation are intact Psych: euthymic mood, full affect  EKG:  EKG from today reviewed and demonstrates electronic ventricular pacemaker 70 bpm, underlying atrial fibrillation  RECENT LABS: 12/01/2020: ALT 18 12/21/2020: BUN 12; Creatinine, Ser 0.96; Hemoglobin 13.0; Platelets 121; Potassium 4.0; Sodium 141  12/01/2020: Cholesterol 179; HDL 62; LDL Cholesterol (Calc) 103; Total CHOL/HDL Ratio 2.9; Triglycerides 58   Estimated Creatinine Clearance: 66.1 mL/min (by C-G formula based on SCr of 0.96 mg/dL).   Wt Readings from Last 3 Encounters:  12/30/20 224 lb 12.8 oz (102 kg)  12/23/20 219 lb 12.8 oz (99.7 kg)  12/21/20 220 lb (99.8 kg)     CARDIAC STUDIES: Echo:  IMPRESSIONS     1. There is likely severe mitral regurgitation. The jet is eccentric and  anteriorly directed. There is splay artifact suggestive of at least  moderate regurgitation. The MR is poorly quantitated. Most impresive  images are PLAX. Suspect it is just very  eccentric. There is likely a flail PMVL chord to explain these findings,  and abrupt change from prior echo. Would recommend a TEE for better  characterization. The mitral valve is myxomatous. Severe mitral valve  regurgitation. No evidence of mitral  stenosis.   2. Left ventricular ejection fraction, by estimation, is 55 to 60%. The  left ventricle has normal function. The left ventricle has no regional  wall motion abnormalities. Left ventricular diastolic function could not  be evaluated.   3. Right ventricular  systolic function is normal. The right ventricular  size is mildly enlarged. There is mildly elevated pulmonary artery  systolic pressure. The estimated right ventricular systolic pressure is  81.8 mmHg.   4. Left atrial size was severely dilated.   5. Right atrial size was severely dilated.   6. Tricuspid valve regurgitation is mild to moderate.   7. The aortic valve is tricuspid. There is mild calcification of the  aortic valve. Aortic valve regurgitation is trivial. Mild aortic valve  sclerosis is present, with no evidence of aortic valve stenosis.   8. The inferior vena cava is dilated in size with >50% respiratory  variability, suggesting right atrial pressure  of 8 mmHg.   Comparison(s): Changes from prior study are noted. MR is now severe. TEE  recommended.   TEE: IMPRESSIONS     1. The mitral valve is myxomatous. There is chordal rutpure with P2 flail  and P3 prolapse with resultant severe, highly eccentric anteriorly  directed mitral regurgitation. EROA 0.4cm2 with RVol 45mL by PISA. No  evidence of mitral stenosis.   2. Left ventricular ejection fraction, by estimation, is 55 to 60%. The  left ventricle has normal function.   3. Right ventricular systolic function is mildly reduced. The right  ventricular size is moderately enlarged. RV pacing wire is present. RVSP  is 9mmHg+RAP.   4. Left atrial size was severely dilated. No left atrial/left atrial  appendage thrombus was detected.   5. Right atrial size was severely dilated.   6. Tricuspid valve regurgitation is severe. Suspect functional due to  annular dilation.   7. The aortic valve is tricuspid. There is mild calcification of the  aortic valve. There is mild thickening of the aortic valve. Aortic valve  regurgitation is trivial. Mild aortic valve sclerosis is present, with no  evidence of aortic valve stenosis.   8. There is Moderate (Grade III) plaque.   FINDINGS   Left Ventricle: Left ventricular ejection  fraction, by estimation, is 55  to 60%. The left ventricle has normal function. The left ventricular  internal cavity size was normal in size.   Right Ventricle: The right ventricular size is moderately enlarged. No  increase in right ventricular wall thickness. Right ventricular systolic  function is mildly reduced.   Left Atrium: Left atrial size was severely dilated. No left atrial/left  atrial appendage thrombus was detected.   Right Atrium: Right atrial size was severely dilated.   Pericardium: There is no evidence of pericardial effusion.   Mitral Valve: The mitral valve is myxomatous. There is chordal rutpure  with P2 flail and P3 prolapse with resultant severe, highly eccentric  anteriorly directed mitral regurgitation. EROA 0.4cm2 with RVol 44mL by  PISA. No evidence of mitral valve  stenosis.   Tricuspid Valve: The tricuspid valve is normal in structure. Tricuspid  valve regurgitation is severe.   Aortic Valve: The aortic valve is tricuspid. There is mild calcification  of the aortic valve. There is mild thickening of the aortic valve. Aortic  valve regurgitation is trivial. Mild aortic valve sclerosis is present,  with no evidence of aortic valve  stenosis.   Pulmonic Valve: The pulmonic valve was normal in structure. Pulmonic valve  regurgitation is trivial.   Aorta: The aortic root is normal in size and structure. There is moderate  (Grade III) plaque.   IAS/Shunts: There is left bowing of the interatrial septum, suggestive of  elevated right atrial pressure. No atrial level shunt detected by color  flow Doppler.   Additional Comments: A device lead is visualized.      MR Peak grad:    124.5 mmHg  TRICUSPID VALVE  MR Mean grad:    74.0 mmHg   TR Peak grad:   38.9 mmHg  MR Vmax:         558.00 cm/s TR Vmax:        312.00 cm/s  MR Vmean:        397.0 cm/s  MR PISA:         6.28 cm  MR PISA Eff ROA: 38 mm  MR PISA Radius:  1.00 cm   ASSESSMENT AND  PLAN: 20.  87 year old  male with severe degenerative mitral regurgitation and New York Heart Association functional class II symptoms of chronic diastolic heart failure.  The patient's transesophageal echo is reviewed and demonstrates findings consistent with severe mitral regurgitation secondary to degenerative changes of ruptured primary chordae tendonae and flail P2 segment of the mitral valve with eccentric MR.  He does not appear to be particularly limited at the present time with only mild shortness of breath.  I have reviewed the natural history of mitral regurgitation with the patient and their family members who are present today. We have discussed the limitations of medical therapy and the poor prognosis associated with symptomatic mitral regurgitation. We have also reviewed potential treatment options, including palliative medical therapy, conventional surgical mitral valve repair or replacement, and percutaneous mitral valve therapies such as edge-to-edge mitral valve approximation with MitraClip. We discussed treatment options in the context of this patient's specific comorbid medical conditions.  I do not think the patient would be a good candidate for conventional heart surgery, nor would he want to consider it in the setting of his advanced age.  After review of his transesophageal echocardiogram, I believe the functional anatomy of his mitral valve is amenable to transcatheter edge-to-edge repair with MitraClip.  I reviewed the MitraClip procedure in detail with the patient and his granddaughter today.  I demonstrated a procedural animation and reviewed all of the steps of the procedure.  They understand that right and left heart catheterization would be done prior to MitraClip in order to evaluate his hemodynamics and assess for obstructive coronary artery disease.  He would then be referred for formal cardiac surgical consultation as part of a multidisciplinary heart team approach to his care.   At this point the patient prefers to discuss things further with his family before proceeding with any more diagnostic testing.  He is not sure that he wants anything done at this time.  All of his questions were answered today and I gave him my contact information so that he can be in touch if he decides to proceed with further evaluation and treatment.  Austin Gray 01/01/2021 6:10 PM     [place] [address]  [phone] (office) [phone] (fax)

## 2020-12-30 NOTE — Patient Instructions (Addendum)
Please call us if you would like to proceed with further testing prior to seeing Dr. Percival Spanish in September.

## 2021-01-01 ENCOUNTER — Encounter: Payer: Self-pay | Admitting: Cardiovascular Disease

## 2021-01-02 ENCOUNTER — Other Ambulatory Visit: Payer: Self-pay | Admitting: Family Medicine

## 2021-01-04 ENCOUNTER — Telehealth: Payer: Self-pay | Admitting: Family Medicine

## 2021-01-04 NOTE — Telephone Encounter (Signed)
No answer unable to leave a message for patient to call back and schedule Medicare Annual Wellness Visit (AWV) in office.   If not able to come in office, please offer to do virtually or by telephone.   Last AWV: 04/25/2016  Please schedule at anytime with [facility]-Nurse Health Advisor.  If any questions, please contact me at [phone]

## 2021-01-15 NOTE — Progress Notes (Signed)
Remote pacemaker transmission.   

## 2021-01-18 ENCOUNTER — Other Ambulatory Visit: Payer: Self-pay | Admitting: Family Medicine

## 2021-01-22 ENCOUNTER — Telehealth: Payer: Self-pay | Admitting: Cardiovascular Disease

## 2021-01-22 NOTE — Telephone Encounter (Signed)
Spoke with pt and he is interested in moving forward with Mitral Clip.  Advised I will get message to structural nurses and they will be in contact.  Asked pt if any upcoming events that we need to avoid.  Pt states he has nothing coming up and he is more concerned about getting this done than going anywhere.

## 2021-01-22 NOTE — Telephone Encounter (Signed)
    Pt would like to speak with Dr. Antionette Char nurse to discuss scheduling procedure to fix his leaky heart

## 2021-01-25 NOTE — Telephone Encounter (Signed)
Scheduled the patient for visit with Nell Range on 02/18/2021 at 1330. He understands he will have EKG, labs, and get R/LHC arranged at that time. Will tentatively post cath 8/10 and the patient will work on transportation. He was grateful for call and agrees with plan.

## 2021-01-26 ENCOUNTER — Telehealth: Payer: Self-pay | Admitting: Family Medicine

## 2021-01-26 NOTE — Telephone Encounter (Signed)
Patient received referral to cardiologist; surgery recommended. Patient wants to discuss surgery with Provider. Please advise at [phone].

## 2021-01-26 NOTE — Telephone Encounter (Signed)
Pls call pt to schedule appt for pt to discuss concerns with pcp.

## 2021-02-01 NOTE — Telephone Encounter (Signed)
Appt scheduled for end of July with provider.

## 2021-02-06 ENCOUNTER — Other Ambulatory Visit: Payer: Self-pay | Admitting: Family Medicine

## 2021-02-11 ENCOUNTER — Other Ambulatory Visit: Payer: Self-pay

## 2021-02-11 ENCOUNTER — Ambulatory Visit (INDEPENDENT_AMBULATORY_CARE_PROVIDER_SITE_OTHER): Payer: Medicare HMO | Admitting: Family Medicine

## 2021-02-11 ENCOUNTER — Encounter: Payer: Self-pay | Admitting: Family Medicine

## 2021-02-11 VITALS — BP 130/72 | HR 74 | Temp 97.9°F | Resp 16 | Ht 70.0 in | Wt 220.0 lb

## 2021-02-11 DIAGNOSIS — L989 Disorder of the skin and subcutaneous tissue, unspecified: Secondary | ICD-10-CM | POA: Diagnosis not present

## 2021-02-11 NOTE — Progress Notes (Signed)
Subjective:    Patient ID: Austin Mesi., male    DOB: [birth year], 87 y.o.   MRN: [id number]  HPI Patient is an 87 year old Caucasian gentleman who has a wartlike lesion growing in the center of his lower back that he would like removed.  On examination is about a 5 mm hyperkeratotic papule with white hard firm scale.  Differential diagnosis includes an actinic keratoses versus a seborrheic keratosis versus a squamous cell carcinoma.  However the lesion is so small I believe is amenable to cryotherapy Past Medical History:  Diagnosis Date   Anxiety    Arthritis    "little bit; left knee"   Atrial fibrillation (HCC)    Chronic   Basal cell carcinoma    Cataracts, bilateral    to have left eye cataract surgery on 03/21/17    CHF (congestive heart failure), NYHA class II, chronic, diastolic (HCC)    Colon polyps    last colonoscopy 2015 (tubular adenoma)   Depression    Diverticulosis    Gastritis    GERD (gastroesophageal reflux disease)    H/O: GI bleed    Recurrent   Hemorrhoids    History of BPH    History of kidney stones    hx of x 1    Hypertension    Pernicious anemia    PVC (premature ventricular contraction)    Rectal bleeding 02/15/12   Vitamin D deficiency    Past Surgical History:  Procedure Laterality Date   COLONOSCOPY WITH PROPOFOL N/A 03/17/2017   Procedure: COLONOSCOPY WITH PROPOFOL;  Surgeon: Carol Ada, MD;  Location: [facility];  Service: Endoscopy;  Laterality: N/A;   [place]; ~ 2002   left; right   PACEMAKER IMPLANT N/A 03/11/2019   Procedure: PACEMAKER IMPLANT;  Surgeon: Constance Haw, MD;  Location: [facility];  Service: Cardiovascular;  Laterality: N/A;   TEE WITHOUT CARDIOVERSION N/A 12/23/2020   Procedure: TRANSESOPHAGEAL ECHOCARDIOGRAM (TEE);  Surgeon: Freada Bergeron, MD;  Location: [facility];  Service: Cardiovascular;  Laterality: N/A;   TEMPORARY PACEMAKER N/A 03/10/2019   Procedure: TEMPORARY  PACEMAKER;  Surgeon: Martinique, Peter M, MD;  Location: [place];  Service: Cardiovascular;  Laterality: N/A;   Current Outpatient Medications on File Prior to Visit  Medication Sig Dispense Refill   albuterol (VENTOLIN HFA) 108 (90 Base) MCG/ACT inhaler INHALE 2 PUFFS EVERY 6 HOURS AS NEEDED FOR WHEEZING OR SHORTNESS OF BREATH 18 g 3   amLODipine (NORVASC) 10 MG tablet TAKE 1 TABLET EVERY DAY 90 tablet 3   carvedilol (COREG) 25 MG tablet Take 1 tablet (25 mg total) by mouth 2 (two) times daily. 180 tablet 1   ferrous sulfate 325 (65 FE) MG tablet Take 325 mg by mouth every other day.      fluticasone (FLONASE) 50 MCG/ACT nasal spray USE 1 SPRAY IN EACH NOSTRIL EVERY DAY 32 g 1   furosemide (LASIX) 40 MG tablet TAKE 2 TABLETS ('80MG'$ ) EVERY MORNING AND 1 TABLET ('40MG'$ ) EVERY EVENING 270 tablet 1   losartan (COZAAR) 50 MG tablet TAKE 1 TABLET EVERY DAY 90 tablet 3   Multiple Vitamin (MULTIVITAMIN) tablet Take 1 tablet by mouth daily.     multivitamin-lutein (OCUVITE-LUTEIN) CAPS Take 1 capsule by mouth daily.     potassium chloride (KLOR-CON) 10 MEQ tablet TAKE 1 TABLET EVERY DAY 90 tablet 3   vitamin B-12 (CYANOCOBALAMIN) 1000 MCG tablet TAKE 1 TABLET EVERY DAY 90 tablet 3  XARELTO 20 MG TABS tablet TAKE 1 TABLET EVERY DAY WITH SUPPER 90 tablet 3   No current facility-administered medications on file prior to visit.   No Known Allergies Social History   Socioeconomic History   Marital status: Widowed    Spouse name: Not on file   Number of children: 1   Years of education: Not on file   Highest education level: Not on file  Occupational History   Occupation: Retired  Tobacco Use   Smoking status: Never   Smokeless tobacco: Former    Types: Snuff    Quit date: 01/26/2018   Tobacco comments:    02/15/12 "still use snuff @ times; not daily"  Vaping Use   Vaping Use: Never used  Substance and Sexual Activity   Alcohol use: No   Drug use: No   Sexual activity: Never     Comment: widower, lives with granddaughter and great grandchildren.  Other Topics Concern   Not on file  Social History Narrative   Lives with grand daughter.         Epworth Sleepiness Scale = 7 (as of 09/21/2015)   Social Determinants of Health   Financial Resource Strain: Low Risk    Difficulty of Paying Living Expenses: Not very hard  Food Insecurity: Not on file  Transportation Needs: Not on file  Physical Activity: Not on file  Stress: Not on file  Social Connections: Not on file  Intimate Partner Violence: Not on file      Review of Systems  All other systems reviewed and are negative.     Objective:   Physical Exam Vitals reviewed.  Constitutional:      Appearance: Normal appearance.  Cardiovascular:     Rate and Rhythm: Normal rate.     Heart sounds: Murmur heard.  Pulmonary:     Effort: Pulmonary effort is normal. No respiratory distress.     Breath sounds: Normal breath sounds. No stridor.  Musculoskeletal:       Back:  Neurological:     Mental Status: He is alert.          Assessment & Plan:   Skin lesion of back Lesion was treated with liquid nitrogen cryotherapy for a total of 30 seconds.  Differential diagnosis includes AK versus SK versus early squamous cell carcinoma.  Reassess if the lesion persists for more than 2 weeks after treatment although I suspect that it will likely degrade and fall off over the next 2 weeks.  Dressing changes and skin care were discussed.

## 2021-02-18 ENCOUNTER — Other Ambulatory Visit: Payer: Self-pay

## 2021-02-18 ENCOUNTER — Ambulatory Visit: Payer: Medicare HMO | Admitting: Physician Assistant

## 2021-02-18 ENCOUNTER — Encounter: Payer: Self-pay | Admitting: Physician Assistant

## 2021-02-18 VITALS — BP 128/70 | HR 70 | Ht 70.0 in | Wt 217.8 lb

## 2021-02-18 DIAGNOSIS — I4821 Permanent atrial fibrillation: Secondary | ICD-10-CM | POA: Diagnosis not present

## 2021-02-18 DIAGNOSIS — I340 Nonrheumatic mitral (valve) insufficiency: Secondary | ICD-10-CM | POA: Diagnosis not present

## 2021-02-18 DIAGNOSIS — I10 Essential (primary) hypertension: Secondary | ICD-10-CM

## 2021-02-18 DIAGNOSIS — Z950 Presence of cardiac pacemaker: Secondary | ICD-10-CM | POA: Diagnosis not present

## 2021-02-18 LAB — BASIC METABOLIC PANEL
BUN/Creatinine Ratio: 13 (ref 10–24)
BUN: 10 mg/dL (ref 8–27)
CO2: 30 mmol/L — ABNORMAL HIGH (ref 20–29)
Calcium: 9 mg/dL (ref 8.6–10.2)
Chloride: 102 mmol/L (ref 96–106)
Creatinine, Ser: 0.75 mg/dL — ABNORMAL LOW (ref 0.76–1.27)
Glucose: 119 mg/dL — ABNORMAL HIGH (ref 65–99)
Potassium: 3.8 mmol/L (ref 3.5–5.2)
Sodium: 141 mmol/L (ref 134–144)
eGFR: 88 mL/min/{1.73_m2} (ref >59)

## 2021-02-18 LAB — CBC
Hematocrit: 35.8 % — ABNORMAL LOW (ref 37.5–51.0)
Hemoglobin: 12.2 g/dL — ABNORMAL LOW (ref 13.0–17.7)
MCH: 28.9 pg (ref 26.6–33.0)
MCHC: 34.1 g/dL (ref 31.5–35.7)
MCV: 85 fL (ref 79–97)
Platelets: 114 10*3/uL — ABNORMAL LOW (ref 150–450)
RBC: 4.22 x10E6/uL (ref 4.14–5.80)
RDW: 14.7 % (ref 11.6–15.4)
WBC: 7.1 10*3/uL (ref 3.4–10.8)

## 2021-02-18 NOTE — H&P (View-Only) (Signed)
[facility]                                     Cardiology Office Note:    Date:  02/18/2021   ID:  Austin Mesi., DOB 06/26/1932, MRN [id number]  PCP:  [name], MD  [facility] Cardiologist:  Minus Breeding, MD  [facility] Electrophysiologist:  None   Referring MD: [name], MD   Continue discussion of MitraClip work up- set up cath  History of Present Illness:    Austin Noto. is a 87 y.o. male with a hx of chronic persistent atrial fibrillation on Xarelto, CHB s/p PPM, HTN, chronic diastolic heart failure and severe mitral regurgitation who presents to clinic for follow up.   Echo 12/07/20 showed EF 55%, severe biatrial enlargement and severe mitral regurgitation. The mitral valve is myxomatous. The jet is eccentric and anteriorly directed. There is splay artifact suggestive of at least moderate regurgitation. The MR is poorly quantitated. Most impresive  images are PLAX. Suspect it is just very eccentric. There is likely a flail PMVL chord to explain these findings, and abrupt change from prior echo. Subsequent TEE 12/23/20 showed EF 55%, mild RV dysfunction /  moderate enlargement. RV pacing wire is present. RVSP is 78mHg+RAP. Severe biatrial enlargement. Severe TR (suspected to be functional due to severe annular dilation). 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 EROA 0.4cm2 with RVol 782mby PISA. No evidence of mitral stenosis.   He was seen by Dr. CoBurt Knackn 12/30/20 for structural heart consultation and discussion of potential Mitral Valve TEER. The patient's transesophageal echo was reviewed and demonstrates findings consistent with severe mitral regurgitation secondary to degenerative changes of ruptured primary chordae tendonae and flail P2 segment of the mitral valve  with eccentric MR.  He did not appear to be particularly limited at that time with only mild shortness of breath. After a full discussion with the patient about the further work up for MitraClip including a L/RCH and technical details and risk surrounding the procedure, the patient preferred to discuss things further with his family before proceeding with any more diagnostic testing.  He was not sure that he wanted anything done at that time. He later discussed this with his family and decided he would like to proceed.   Today the patient presents to clinic for follow up. Here with granddaughter. He does have shortness of breath with any type of exertion. He mostly sits but can walk with a rolling walker. He has chronic LE edema and orthopnea. No PND. No dizziness or syncope.  Has not seen a dentist in decades. Most of his teeth are broken or rotten. He has no pain. He is mostly worried about the cost surrounding his care and says that he has no money and cannot afford the copay for the cath. He is hoping to switch insurance next month.    Past Medical History:  Diagnosis Date   Anxiety    Arthritis    "little bit; left knee"   Atrial fibrillation (HCC)    Chronic   Basal cell carcinoma    Cataracts, bilateral    to have left eye cataract surgery on 03/21/17    CHF (congestive heart failure), NYHA class  II, chronic, diastolic (HCC)    Colon polyps    last colonoscopy 2015 (tubular adenoma)   Depression    Diverticulosis    Gastritis    GERD (gastroesophageal reflux disease)    H/O: GI bleed    Recurrent   Hemorrhoids    History of BPH    History of kidney stones    hx of x 1    Hypertension    Pernicious anemia    PVC (premature ventricular contraction)    Rectal bleeding 02/15/12   Vitamin D deficiency     Past Surgical History:  Procedure Laterality Date   COLONOSCOPY WITH PROPOFOL N/A 03/17/2017   Procedure: COLONOSCOPY WITH PROPOFOL;  Surgeon: Carol Ada, MD;  Location: [facility]  ENDOSCOPY;  Service: Endoscopy;  Laterality: N/A;   [place]; ~ 2002   left; right   PACEMAKER IMPLANT N/A 03/11/2019   Procedure: PACEMAKER IMPLANT;  Surgeon: Constance Haw, MD;  Location: [place] [facility];  Service: Cardiovascular;  Laterality: N/A;   TEE WITHOUT CARDIOVERSION N/A 12/23/2020   Procedure: TRANSESOPHAGEAL ECHOCARDIOGRAM (TEE);  Surgeon: Freada Bergeron, MD;  Location: [facility] ENDOSCOPY;  Service: Cardiovascular;  Laterality: N/A;   TEMPORARY PACEMAKER N/A 03/10/2019   Procedure: TEMPORARY PACEMAKER;  Surgeon: Martinique, Peter M, MD;  Location: [place] [facility];  Service: Cardiovascular;  Laterality: N/A;    Current Medications: Current Meds  Medication Sig   albuterol (VENTOLIN HFA) 108 (90 Base) MCG/ACT inhaler INHALE 2 PUFFS EVERY 6 HOURS AS NEEDED FOR WHEEZING OR SHORTNESS OF BREATH   amLODipine (NORVASC) 10 MG tablet TAKE 1 TABLET EVERY DAY   carvedilol (COREG) 25 MG tablet Take 1 tablet (25 mg total) by mouth 2 (two) times daily.   ferrous sulfate 325 (65 FE) MG tablet Take 325 mg by mouth every other day.    fluticasone (FLONASE) 50 MCG/ACT nasal spray USE 1 SPRAY IN EACH NOSTRIL EVERY DAY   furosemide (LASIX) 40 MG tablet TAKE 2 TABLETS ('80MG'$ ) EVERY MORNING AND 1 TABLET ('40MG'$ ) EVERY EVENING   losartan (COZAAR) 50 MG tablet TAKE 1 TABLET EVERY DAY   Multiple Vitamin (MULTIVITAMIN) tablet Take 1 tablet by mouth daily.   multivitamin-lutein (OCUVITE-LUTEIN) CAPS Take 1 capsule by mouth daily.   potassium chloride (KLOR-CON) 10 MEQ tablet TAKE 1 TABLET EVERY DAY   vitamin B-12 (CYANOCOBALAMIN) 1000 MCG tablet TAKE 1 TABLET EVERY DAY   XARELTO 20 MG TABS tablet TAKE 1 TABLET EVERY DAY WITH SUPPER     Allergies:   Patient has no known allergies.   Social History   Socioeconomic History   Marital status: Widowed    Spouse name: Not on file   Number of children: 1   Years of education: Not on file   Highest education level: Not on file   Occupational History   Occupation: Retired  Tobacco Use   Smoking status: Never   Smokeless tobacco: Former    Types: Snuff    Quit date: 01/26/2018   Tobacco comments:    02/15/12 "still use snuff @ times; not daily"  Vaping Use   Vaping Use: Never used  Substance and Sexual Activity   Alcohol use: No   Drug use: No   Sexual activity: Never    Comment: widower, lives with granddaughter and great grandchildren.  Other Topics Concern   Not on file  Social History Narrative   Lives with grand daughter.         Epworth Sleepiness Scale =  7 (as of 09/21/2015)   Social Determinants of Health   Financial Resource Strain: Low Risk    Difficulty of Paying Living Expenses: Not very hard  Food Insecurity: Not on file  Transportation Needs: Not on file  Physical Activity: Not on file  Stress: Not on file  Social Connections: Not on file     Family History: The patient's family history includes Cancer in his mother; Stroke (age of onset: 13) in his father.  ROS:   Please see the history of present illness.    All other systems reviewed and are negative.  EKGs/Labs/Other Studies Reviewed:    The following studies were reviewed today:  Echo: 11/17/20 IMPRESSIONS   1. There is likely severe mitral regurgitation. The jet is eccentric and  anteriorly directed. There is splay artifact suggestive of at least  moderate regurgitation. The MR is poorly quantitated. Most impresive  images are PLAX. Suspect it is just very  eccentric. There is likely a flail PMVL chord to explain these findings,  and abrupt change from prior echo. Would recommend a TEE for better  characterization. The mitral valve is myxomatous. Severe mitral valve  regurgitation. No evidence of mitral  stenosis.   2. Left ventricular ejection fraction, by estimation, is 55 to 60%. The  left ventricle has normal function. The left ventricle has no regional  wall motion abnormalities. Left ventricular diastolic  function could not  be evaluated.   3. Right ventricular systolic function is normal. The right ventricular  size is mildly enlarged. There is mildly elevated pulmonary artery  systolic pressure. The estimated right ventricular systolic pressure is  0000000 mmHg.   4. Left atrial size was severely dilated.   5. Right atrial size was severely dilated.   6. Tricuspid valve regurgitation is mild to moderate.   7. The aortic valve is tricuspid. There is mild calcification of the  aortic valve. Aortic valve regurgitation is trivial. Mild aortic valve  sclerosis is present, with no evidence of aortic valve stenosis.   8. The inferior vena cava is dilated in size with >50% respiratory  variability, suggesting right atrial pressure of 8 mmHg.   Comparison(s): Changes from prior study are noted. MR is now severe. TEE  recommended.   __________________________  TEE:12/23/20 IMPRESSIONS  1. The mitral valve is myxomatous. There is chordal rutpure with P2 flail  and P3 prolapse with resultant severe, highly eccentric anteriorly  directed mitral regurgitation. EROA 0.4cm2 with RVol 6m by PISA. No  evidence of mitral stenosis.   2. Left ventricular ejection fraction, by estimation, is 55 to 60%. The  left ventricle has normal function.   3. Right ventricular systolic function is mildly reduced. The right  ventricular size is moderately enlarged. RV pacing wire is present. RVSP  is 362mg+RAP.   4. Left atrial size was severely dilated. No left atrial/left atrial  appendage thrombus was detected.   5. Right atrial size was severely dilated.   6. Tricuspid valve regurgitation is severe. Suspect functional due to  annular dilation.   7. The aortic valve is tricuspid. There is mild calcification of the  aortic valve. There is mild thickening of the aortic valve. Aortic valve  regurgitation is trivial. Mild aortic valve sclerosis is present, with no  evidence of aortic valve stenosis.   8. There is  Moderate (Grade III) plaque.   FINDINGS   Left Ventricle: Left ventricular ejection fraction, by estimation, is 55  to 60%. The left ventricle has normal function.  The left ventricular  internal cavity size was normal in size.   Right Ventricle: The right ventricular size is moderately enlarged. No  increase in right ventricular wall thickness. Right ventricular systolic  function is mildly reduced.   Left Atrium: Left atrial size was severely dilated. No left atrial/left  atrial appendage thrombus was detected.   Right Atrium: Right atrial size was severely dilated.   Pericardium: There is no evidence of pericardial effusion.   Mitral Valve: The mitral valve is myxomatous. There is chordal rutpure  with P2 flail and P3 prolapse with resultant severe, highly eccentric  anteriorly directed mitral regurgitation. EROA 0.4cm2 with RVol 35m by  PISA. No evidence of mitral valve  stenosis.   Tricuspid Valve: The tricuspid valve is normal in structure. Tricuspid  valve regurgitation is severe.   Aortic Valve: The aortic valve is tricuspid. There is mild calcification  of the aortic valve. There is mild thickening of the aortic valve. Aortic  valve regurgitation is trivial. Mild aortic valve sclerosis is present,  with no evidence of aortic valve  stenosis.   Pulmonic Valve: The pulmonic valve was normal in structure. Pulmonic valve  regurgitation is trivial.   Aorta: The aortic root is normal in size and structure. There is moderate  (Grade III) plaque.   IAS/Shunts: There is left bowing of the interatrial septum, suggestive of  elevated right atrial pressure. No atrial level shunt detected by color  flow Doppler.   Additional Comments: A device lead is visualized.      MR Peak grad:    124.5 mmHg  TRICUSPID VALVE  MR Mean grad:    74.0 mmHg   TR Peak grad:   38.9 mmHg  MR Vmax:         558.00 cm/s TR Vmax:        312.00 cm/s  MR Vmean:        397.0 cm/s  MR PISA:          6.28 cm  MR PISA Eff ROA: 38 mm  MR PISA Radius:  1.00 cm  EKG:  EKG is ordered today.  This shows afib with LBBB HR 70  Recent Labs: 12/01/2020: ALT 18 12/21/2020: BUN 12; Creatinine, Ser 0.96; Hemoglobin 13.0; Platelets 121; Potassium 4.0; Sodium 141  Recent Lipid Panel    Component Value Date/Time   CHOL 179 12/01/2020 0808   TRIG 58 12/01/2020 0808   HDL 62 12/01/2020 0808   CHOLHDL 2.9 12/01/2020 0808   VLDL 11 04/08/2016 0823   LDLCALC 103 (H) 12/01/2020 0808     Risk Assessment/Calculations:    CHA2DS2-VASc Score = 5  This indicates a 7.2% annual risk of stroke. The patient's score is based upon: CHF History: Yes HTN History: Yes Diabetes History: No Stroke History: No Vascular Disease History: Yes Age Score: 2 Gender Score: 0     Physical Exam:    VS:  BP 128/70   Pulse 70   Ht '5\' 10"'$  (1.778 m)   Wt 217 lb 12.8 oz (98.8 kg)   SpO2 98%   BMI 31.25 kg/m     Wt Readings from Last 3 Encounters:  02/18/21 217 lb 12.8 oz (98.8 kg)  02/11/21 220 lb (99.8 kg)  12/30/20 224 lb 12.8 oz (102 kg)     GEN: Elderly and disheveled appearing.  HEENT: Normal. Teeth appear to be mostly rotting, brown stubs, several missing NECK: No JVD; LYMPHATICS: No lymphadenopathy CARDIAC: irrreg irreg, 3/6 holosystolic murmur at apexl. No  rubs, gallops RESPIRATORY:  Clear to auscultation without rales, wheezing or rhonchi  ABDOMEN: Soft, non-tender, non-distended MUSCULOSKELETAL:  No edema; No deformity  SKIN: Warm and dry. 1 + bilateral LE edema.  NEUROLOGIC:  Alert and oriented x 3 PSYCHIATRIC:  Normal affect    STS score Procedure: Isolated MVR Risk of Mortality: 4.460% Renal Failure: 2.851% Permanent Stroke: 1.809% Prolonged Ventilation: 11.539% DSW Infection: 0.137% Reoperation: 6.619% Morbidity or Mortality: 17.777% Short Length of Stay: 19.567% Long Length of Stay: 9.441%  _________________________  Procedure: MV Repair Risk of  Mortality: 3.477% Renal Failure: 2.414% Permanent Stroke: 1.907% Prolonged Ventilation: 9.069% DSW Infection: 0.072% Reoperation: 4.129% Morbidity or Mortality: 14.202% Short Length of Stay: 26.921% Long Length of Stay: 6.677%   ASSESSMENT:    1. Nonrheumatic mitral valve regurgitation   2. Permanent atrial fibrillation ([place])   3. Essential hypertension   4. Cardiac pacemaker in situ    PLAN:    In order of problems listed above:  Severe non rheumatic MR: with NYHA class III symptoms. I have reviewed the natural history of severe mitral regurgitation with the patient. We have discussed the limitations of medical therapy and the poor prognosis associated with symptomatic mitral regurigation. We have reviewed potential treatment options, including palliative medical therapy, conventional surgical mitral valve replacement/repair, and percutaneous therapies such as edge to edge mitral valve approximation with MitraClip. We discussed treatment options in the context of this patient's specific comorbid medical conditions. He wishes to proceed with further work up. Next step is L/RHC with Dr. Burt Knack which has been set up for 8/10. Will also need a dental consultation.   Persistent atrial fib: continue Xarelto, given instructions on when to hold for cath. Rate well controlled.   HTN: BP well controlled. No changes made   S/p PPM: followed by Dr. Curt Bears.   Shared Decision Making/Informed Consent The risks [stroke (1 in 1000), death (1 in 1000), kidney failure [usually temporary] (1 in 500), bleeding (1 in 200), allergic reaction [possibly serious] (1 in 200)], benefits (diagnostic support and management of coronary artery disease) and alternatives of a cardiac catheterization were discussed in detail with Mr. Shonk and he is willing to proceed.    Medication Adjustments/Labs and Tests Ordered: Current medicines are reviewed at length with the patient today.  Concerns regarding  medicines are outlined above.  Orders Placed This Encounter  Procedures   CBC   Basic metabolic panel   Ambulatory referral to Dentistry   EKG 12-Lead    No orders of the defined types were placed in this encounter.   Patient Instructions  Medication Instructions:  No changes *If you need a refill on your cardiac medications before your next appointment, please call your pharmacy*   Lab Work: Today: BMET, CBC  If you have labs (blood work) drawn today and your tests are completely normal, you will receive your results only by: [place] (if you have MyChart) OR A paper copy in the mail If you have any lab test that is abnormal or we need to change your treatment, we will call you to review the results.   Testing/Procedures: none   Follow-Up: Per Structural Heart Team   Other Instructions You have been referred to Dr. Sandi Mariscal for dental consultation.      Signed, Angelena Form, PA-C  02/18/2021 2:59 PM    [facility]

## 2021-02-18 NOTE — Patient Instructions (Signed)
Medication Instructions:  No changes *If you need a refill on your cardiac medications before your next appointment, please call your pharmacy*   Lab Work: Today: BMET, CBC  If you have labs (blood work) drawn today and your tests are completely normal, you will receive your results only by: [place] (if you have MyChart) OR A paper copy in the mail If you have any lab test that is abnormal or we need to change your treatment, we will call you to review the results.   Testing/Procedures: none   Follow-Up: Per Structural Heart Team   Other Instructions You have been referred to Dr. Sandi Mariscal for dental consultation.

## 2021-02-18 NOTE — Progress Notes (Signed)
HEART AND [place]                                     Cardiology Office Note:    Date:  02/18/2021   ID:  Austin Mesi., DOB Sep 15, 1931, MRN [id number]  PCP:  [name], MD  [facility] Cardiologist:  Minus Breeding, MD  [facility] Electrophysiologist:  None   Referring MD: [name], MD   Continue discussion of MitraClip work up- set up cath  History of Present Illness:    Austin Gutherie. is a 87 y.o. male with a hx of chronic persistent atrial fibrillation on Xarelto, CHB s/p PPM, HTN, chronic diastolic heart failure and severe mitral regurgitation who presents to clinic for follow up.   Echo 12/07/20 showed EF 55%, severe biatrial enlargement and severe mitral regurgitation. The mitral valve is myxomatous. The jet is eccentric and anteriorly directed. There is splay artifact suggestive of at least moderate regurgitation. The MR is poorly quantitated. Most impresive  images are PLAX. Suspect it is just very eccentric. There is likely a flail PMVL chord to explain these findings, and abrupt change from prior echo. Subsequent TEE 12/23/20 showed EF 55%, mild RV dysfunction /  moderate enlargement. RV pacing wire is present. RVSP is 37mHg+RAP. Severe biatrial enlargement. Severe TR (suspected to be functional due to severe annular dilation). 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 EROA 0.4cm2 with RVol 71mby PISA. No evidence of mitral stenosis.   He was seen by Dr. CoBurt Knackn 12/30/20 for structural heart consultation and discussion of potential Mitral Valve TEER. The patient's transesophageal echo was reviewed and demonstrates findings consistent with severe mitral regurgitation secondary to degenerative changes of ruptured primary chordae tendonae and flail P2 segment of the mitral valve  with eccentric MR.  He did not appear to be particularly limited at that time with only mild shortness of breath. After a full discussion with the patient about the further work up for MitraClip including a L/RCH and technical details and risk surrounding the procedure, the patient preferred to discuss things further with his family before proceeding with any more diagnostic testing.  He was not sure that he wanted anything done at that time. He later discussed this with his family and decided he would like to proceed.   Today the patient presents to clinic for follow up. Here with granddaughter. He does have shortness of breath with any type of exertion. He mostly sits but can walk with a rolling walker. He has chronic LE edema and orthopnea. No PND. No dizziness or syncope.  Has not seen a dentist in decades. Most of his teeth are broken or rotten. He has no pain. He is mostly worried about the cost surrounding his care and says that he has no money and cannot afford the copay for the cath. He is hoping to switch insurance next month.    Past Medical History:  Diagnosis Date   Anxiety    Arthritis    "little bit; left knee"   Atrial fibrillation (HCC)    Chronic   Basal cell carcinoma    Cataracts, bilateral    to have left eye cataract surgery on 03/21/17    CHF (congestive heart failure), NYHA class  II, chronic, diastolic (HCC)    Colon polyps    last colonoscopy 2015 (tubular adenoma)   Depression    Diverticulosis    Gastritis    GERD (gastroesophageal reflux disease)    H/O: GI bleed    Recurrent   Hemorrhoids    History of BPH    History of kidney stones    hx of x 1    Hypertension    Pernicious anemia    PVC (premature ventricular contraction)    Rectal bleeding 02/15/12   Vitamin D deficiency     Past Surgical History:  Procedure Laterality Date   COLONOSCOPY WITH PROPOFOL N/A 03/17/2017   Procedure: COLONOSCOPY WITH PROPOFOL;  Surgeon: Carol Ada, MD;  Location: [facility]  ENDOSCOPY;  Service: Endoscopy;  Laterality: N/A;   [place]; ~ 2002   left; right   PACEMAKER IMPLANT N/A 03/11/2019   Procedure: PACEMAKER IMPLANT;  Surgeon: Constance Haw, MD;  Location: [facility];  Service: Cardiovascular;  Laterality: N/A;   TEE WITHOUT CARDIOVERSION N/A 12/23/2020   Procedure: TRANSESOPHAGEAL ECHOCARDIOGRAM (TEE);  Surgeon: Freada Bergeron, MD;  Location: [facility] ENDOSCOPY;  Service: Cardiovascular;  Laterality: N/A;   TEMPORARY PACEMAKER N/A 03/10/2019   Procedure: TEMPORARY PACEMAKER;  Surgeon: Martinique, Peter M, MD;  Location: [place] [facility];  Service: Cardiovascular;  Laterality: N/A;    Current Medications: Current Meds  Medication Sig   albuterol (VENTOLIN HFA) 108 (90 Base) MCG/ACT inhaler INHALE 2 PUFFS EVERY 6 HOURS AS NEEDED FOR WHEEZING OR SHORTNESS OF BREATH   amLODipine (NORVASC) 10 MG tablet TAKE 1 TABLET EVERY DAY   carvedilol (COREG) 25 MG tablet Take 1 tablet (25 mg total) by mouth 2 (two) times daily.   ferrous sulfate 325 (65 FE) MG tablet Take 325 mg by mouth every other day.    fluticasone (FLONASE) 50 MCG/ACT nasal spray USE 1 SPRAY IN EACH NOSTRIL EVERY DAY   furosemide (LASIX) 40 MG tablet TAKE 2 TABLETS ('80MG'$ ) EVERY MORNING AND 1 TABLET ('40MG'$ ) EVERY EVENING   losartan (COZAAR) 50 MG tablet TAKE 1 TABLET EVERY DAY   Multiple Vitamin (MULTIVITAMIN) tablet Take 1 tablet by mouth daily.   multivitamin-lutein (OCUVITE-LUTEIN) CAPS Take 1 capsule by mouth daily.   potassium chloride (KLOR-CON) 10 MEQ tablet TAKE 1 TABLET EVERY DAY   vitamin B-12 (CYANOCOBALAMIN) 1000 MCG tablet TAKE 1 TABLET EVERY DAY   XARELTO 20 MG TABS tablet TAKE 1 TABLET EVERY DAY WITH SUPPER     Allergies:   Patient has no known allergies.   Social History   Socioeconomic History   Marital status: Widowed    Spouse name: Not on file   Number of children: 1   Years of education: Not on file   Highest education level: Not on file   Occupational History   Occupation: Retired  Tobacco Use   Smoking status: Never   Smokeless tobacco: Former    Types: Snuff    Quit date: 01/26/2018   Tobacco comments:    02/15/12 "still use snuff @ times; not daily"  Vaping Use   Vaping Use: Never used  Substance and Sexual Activity   Alcohol use: No   Drug use: No   Sexual activity: Never    Comment: widower, lives with granddaughter and great grandchildren.  Other Topics Concern   Not on file  Social History Narrative   Lives with grand daughter.         Epworth Sleepiness Scale =  7 (as of 09/21/2015)   Social Determinants of Health   Financial Resource Strain: Low Risk    Difficulty of Paying Living Expenses: Not very hard  Food Insecurity: Not on file  Transportation Needs: Not on file  Physical Activity: Not on file  Stress: Not on file  Social Connections: Not on file     Family History: The patient's family history includes Cancer in his mother; Stroke (age of onset: 34) in his father.  ROS:   Please see the history of present illness.    All other systems reviewed and are negative.  EKGs/Labs/Other Studies Reviewed:    The following studies were reviewed today:  Echo: 11/17/20 IMPRESSIONS   1. There is likely severe mitral regurgitation. The jet is eccentric and  anteriorly directed. There is splay artifact suggestive of at least  moderate regurgitation. The MR is poorly quantitated. Most impresive  images are PLAX. Suspect it is just very  eccentric. There is likely a flail PMVL chord to explain these findings,  and abrupt change from prior echo. Would recommend a TEE for better  characterization. The mitral valve is myxomatous. Severe mitral valve  regurgitation. No evidence of mitral  stenosis.   2. Left ventricular ejection fraction, by estimation, is 55 to 60%. The  left ventricle has normal function. The left ventricle has no regional  wall motion abnormalities. Left ventricular diastolic  function could not  be evaluated.   3. Right ventricular systolic function is normal. The right ventricular  size is mildly enlarged. There is mildly elevated pulmonary artery  systolic pressure. The estimated right ventricular systolic pressure is  0000000 mmHg.   4. Left atrial size was severely dilated.   5. Right atrial size was severely dilated.   6. Tricuspid valve regurgitation is mild to moderate.   7. The aortic valve is tricuspid. There is mild calcification of the  aortic valve. Aortic valve regurgitation is trivial. Mild aortic valve  sclerosis is present, with no evidence of aortic valve stenosis.   8. The inferior vena cava is dilated in size with >50% respiratory  variability, suggesting right atrial pressure of 8 mmHg.   Comparison(s): Changes from prior study are noted. MR is now severe. TEE  recommended.   __________________________  TEE:12/23/20 IMPRESSIONS  1. The mitral valve is myxomatous. There is chordal rutpure with P2 flail  and P3 prolapse with resultant severe, highly eccentric anteriorly  directed mitral regurgitation. EROA 0.4cm2 with RVol 68m by PISA. No  evidence of mitral stenosis.   2. Left ventricular ejection fraction, by estimation, is 55 to 60%. The  left ventricle has normal function.   3. Right ventricular systolic function is mildly reduced. The right  ventricular size is moderately enlarged. RV pacing wire is present. RVSP  is 373mg+RAP.   4. Left atrial size was severely dilated. No left atrial/left atrial  appendage thrombus was detected.   5. Right atrial size was severely dilated.   6. Tricuspid valve regurgitation is severe. Suspect functional due to  annular dilation.   7. The aortic valve is tricuspid. There is mild calcification of the  aortic valve. There is mild thickening of the aortic valve. Aortic valve  regurgitation is trivial. Mild aortic valve sclerosis is present, with no  evidence of aortic valve stenosis.   8. There is  Moderate (Grade III) plaque.   FINDINGS   Left Ventricle: Left ventricular ejection fraction, by estimation, is 55  to 60%. The left ventricle has normal function.  The left ventricular  internal cavity size was normal in size.   Right Ventricle: The right ventricular size is moderately enlarged. No  increase in right ventricular wall thickness. Right ventricular systolic  function is mildly reduced.   Left Atrium: Left atrial size was severely dilated. No left atrial/left  atrial appendage thrombus was detected.   Right Atrium: Right atrial size was severely dilated.   Pericardium: There is no evidence of pericardial effusion.   Mitral Valve: The mitral valve is myxomatous. There is chordal rutpure  with P2 flail and P3 prolapse with resultant severe, highly eccentric  anteriorly directed mitral regurgitation. EROA 0.4cm2 with RVol 66m by  PISA. No evidence of mitral valve  stenosis.   Tricuspid Valve: The tricuspid valve is normal in structure. Tricuspid  valve regurgitation is severe.   Aortic Valve: The aortic valve is tricuspid. There is mild calcification  of the aortic valve. There is mild thickening of the aortic valve. Aortic  valve regurgitation is trivial. Mild aortic valve sclerosis is present,  with no evidence of aortic valve  stenosis.   Pulmonic Valve: The pulmonic valve was normal in structure. Pulmonic valve  regurgitation is trivial.   Aorta: The aortic root is normal in size and structure. There is moderate  (Grade III) plaque.   IAS/Shunts: There is left bowing of the interatrial septum, suggestive of  elevated right atrial pressure. No atrial level shunt detected by color  flow Doppler.   Additional Comments: A device lead is visualized.      MR Peak grad:    124.5 mmHg  TRICUSPID VALVE  MR Mean grad:    74.0 mmHg   TR Peak grad:   38.9 mmHg  MR Vmax:         558.00 cm/s TR Vmax:        312.00 cm/s  MR Vmean:        397.0 cm/s  MR PISA:          6.28 cm  MR PISA Eff ROA: 38 mm  MR PISA Radius:  1.00 cm  EKG:  EKG is ordered today.  This shows afib with LBBB HR 70  Recent Labs: 12/01/2020: ALT 18 12/21/2020: BUN 12; Creatinine, Ser 0.96; Hemoglobin 13.0; Platelets 121; Potassium 4.0; Sodium 141  Recent Lipid Panel    Component Value Date/Time   CHOL 179 12/01/2020 0808   TRIG 58 12/01/2020 0808   HDL 62 12/01/2020 0808   CHOLHDL 2.9 12/01/2020 0808   VLDL 11 04/08/2016 0823   LDLCALC 103 (H) 12/01/2020 0808     Risk Assessment/Calculations:    CHA2DS2-VASc Score = 5  This indicates a 7.2% annual risk of stroke. The patient's score is based upon: CHF History: Yes HTN History: Yes Diabetes History: No Stroke History: No Vascular Disease History: Yes Age Score: 2 Gender Score: 0     Physical Exam:    VS:  BP 128/70   Pulse 70   Ht '5\' 10"'$  (1.778 m)   Wt 217 lb 12.8 oz (98.8 kg)   SpO2 98%   BMI 31.25 kg/m     Wt Readings from Last 3 Encounters:  02/18/21 217 lb 12.8 oz (98.8 kg)  02/11/21 220 lb (99.8 kg)  12/30/20 224 lb 12.8 oz (102 kg)     GEN: Elderly and disheveled appearing.  HEENT: Normal. Teeth appear to be mostly rotting, brown stubs, several missing NECK: No JVD; LYMPHATICS: No lymphadenopathy CARDIAC: irrreg irreg, 3/6 holosystolic murmur at apexl. No  rubs, gallops RESPIRATORY:  Clear to auscultation without rales, wheezing or rhonchi  ABDOMEN: Soft, non-tender, non-distended MUSCULOSKELETAL:  No edema; No deformity  SKIN: Warm and dry. 1 + bilateral LE edema.  NEUROLOGIC:  Alert and oriented x 3 PSYCHIATRIC:  Normal affect    STS score Procedure: Isolated MVR Risk of Mortality: 4.460% Renal Failure: 2.851% Permanent Stroke: 1.809% Prolonged Ventilation: 11.539% DSW Infection: 0.137% Reoperation: 6.619% Morbidity or Mortality: 17.777% Short Length of Stay: 19.567% Long Length of Stay: 9.441%  _________________________  Procedure: MV Repair Risk of  Mortality: 3.477% Renal Failure: 2.414% Permanent Stroke: 1.907% Prolonged Ventilation: 9.069% DSW Infection: 0.072% Reoperation: 4.129% Morbidity or Mortality: 14.202% Short Length of Stay: 26.921% Long Length of Stay: 6.677%   ASSESSMENT:    1. Nonrheumatic mitral valve regurgitation   2. Permanent atrial fibrillation ([place])   3. Essential hypertension   4. Cardiac pacemaker in situ    PLAN:    In order of problems listed above:  Severe non rheumatic MR: with NYHA class III symptoms. I have reviewed the natural history of severe mitral regurgitation with the patient. We have discussed the limitations of medical therapy and the poor prognosis associated with symptomatic mitral regurigation. We have reviewed potential treatment options, including palliative medical therapy, conventional surgical mitral valve replacement/repair, and percutaneous therapies such as edge to edge mitral valve approximation with MitraClip. We discussed treatment options in the context of this patient's specific comorbid medical conditions. He wishes to proceed with further work up. Next step is L/RHC with Dr. Burt Knack which has been set up for 8/10. Will also need a dental consultation.   Persistent atrial fib: continue Xarelto, given instructions on when to hold for cath. Rate well controlled.   HTN: BP well controlled. No changes made   S/p PPM: followed by Dr. Curt Bears.   Shared Decision Making/Informed Consent The risks [stroke (1 in 1000), death (1 in 1000), kidney failure [usually temporary] (1 in 500), bleeding (1 in 200), allergic reaction [possibly serious] (1 in 200)], benefits (diagnostic support and management of coronary artery disease) and alternatives of a cardiac catheterization were discussed in detail with Mr. Volner and he is willing to proceed.    Medication Adjustments/Labs and Tests Ordered: Current medicines are reviewed at length with the patient today.  Concerns regarding  medicines are outlined above.  Orders Placed This Encounter  Procedures   CBC   Basic metabolic panel   Ambulatory referral to Dentistry   EKG 12-Lead    No orders of the defined types were placed in this encounter.   Patient Instructions  Medication Instructions:  No changes *If you need a refill on your cardiac medications before your next appointment, please call your pharmacy*   Lab Work: Today: BMET, CBC  If you have labs (blood work) drawn today and your tests are completely normal, you will receive your results only by: [place] (if you have MyChart) OR A paper copy in the mail If you have any lab test that is abnormal or we need to change your treatment, we will call you to review the results.   Testing/Procedures: none   Follow-Up: Per Structural Heart Team   Other Instructions You have been referred to Dr. Sandi Mariscal for dental consultation.      Signed, Angelena Form, PA-C  02/18/2021 2:59 PM     [facility]

## 2021-02-19 ENCOUNTER — Telehealth: Payer: Self-pay | Admitting: *Deleted

## 2021-02-19 NOTE — Telephone Encounter (Signed)
I spoke with the patient and confirmed he has not received instruction for procedure 02/24/21.   He is at home alone at this time and he cannot write.  Reviewed verbally that pt is to:  Arrive at [facility] on Wed 02/24/21 at 8:30 am. No solid foods after midnight and clear liquids until 5 am.  Hold lasix day of procedure Hold Xarelto after Sunday 02/21/21 dose.  Adv he can take other medications w a sip of water.   Adv the nurse navigator from the cath lab will review labs and give him a call back on Monday.  I asked him to ask one of his family members to call back when they get home so that they can write the instructions.

## 2021-02-22 ENCOUNTER — Telehealth: Payer: Self-pay | Admitting: *Deleted

## 2021-02-22 NOTE — Telephone Encounter (Signed)
Cardiac catheterization scheduled at [facility] for: Wednesday February 24, 2021 10:30 AM [facility] Main Entrance [facility]) at: 8:30 AM   No solid food after midnight prior to cath, clear liquids until 5 AM day of procedure.  Medication instructions: Hold: -Xarelto-none 02/22/21 until post procedure -Lasix/KCl-AM of procedure   Except hold medications AM meds can be  taken pre-cath with sips of water including aspirin 81 mg.   Confirmed patient has responsible adult to drive home post procedure and be with patient first 24 hours after arriving home.  Patients are allowed one visitor in the waiting room during the time they are at the hospital for their procedure. Both patient and visitor must wear a mask once they enter the hospital.   Patient reports does not currently have any symptoms concerning for COVID-19 and no household members with COVID-19 like illness.      Reviewed procedure/mask/visitor instructions with patient.

## 2021-02-22 NOTE — Telephone Encounter (Signed)
Forms printed and placed to be signed for Dr. Burt Knack.

## 2021-02-24 ENCOUNTER — Ambulatory Visit (HOSPITAL_COMMUNITY)
Admission: RE | Admit: 2021-02-24 | Discharge: 2021-02-24 | Disposition: A | Discharge status: Home / Self Care | Payer: Medicare HMO | Attending: Cardiovascular Disease | Admitting: Cardiovascular Disease

## 2021-02-24 ENCOUNTER — Encounter (HOSPITAL_COMMUNITY)
Admission: RE | Disposition: A | Discharge status: Home / Self Care | Payer: Self-pay | Source: Home / Self Care | Attending: Cardiovascular Disease

## 2021-02-24 ENCOUNTER — Other Ambulatory Visit: Payer: Self-pay

## 2021-02-24 DIAGNOSIS — Z950 Presence of cardiac pacemaker: Secondary | ICD-10-CM | POA: Diagnosis not present

## 2021-02-24 DIAGNOSIS — Z7901 Long term (current) use of anticoagulants: Secondary | ICD-10-CM | POA: Diagnosis not present

## 2021-02-24 DIAGNOSIS — I2510 Atherosclerotic heart disease of native coronary artery without angina pectoris: Secondary | ICD-10-CM | POA: Diagnosis not present

## 2021-02-24 DIAGNOSIS — I110 Hypertensive heart disease with heart failure: Secondary | ICD-10-CM | POA: Insufficient documentation

## 2021-02-24 DIAGNOSIS — Z79899 Other long term (current) drug therapy: Secondary | ICD-10-CM | POA: Insufficient documentation

## 2021-02-24 DIAGNOSIS — I083 Combined rheumatic disorders of mitral, aortic and tricuspid valves: Secondary | ICD-10-CM | POA: Diagnosis not present

## 2021-02-24 DIAGNOSIS — Z87891 Personal history of nicotine dependence: Secondary | ICD-10-CM | POA: Insufficient documentation

## 2021-02-24 DIAGNOSIS — I340 Nonrheumatic mitral (valve) insufficiency: Secondary | ICD-10-CM | POA: Diagnosis present

## 2021-02-24 DIAGNOSIS — I4821 Permanent atrial fibrillation: Secondary | ICD-10-CM | POA: Diagnosis not present

## 2021-02-24 DIAGNOSIS — I5032 Chronic diastolic (congestive) heart failure: Secondary | ICD-10-CM | POA: Diagnosis not present

## 2021-02-24 HISTORY — PX: RIGHT/LEFT HEART CATH AND CORONARY ANGIOGRAPHY: CATH118266

## 2021-02-24 LAB — POCT I-STAT 7, (LYTES, BLD GAS, ICA,H+H)
Acid-Base Excess: 2 mmol/L (ref 0.0–2.0)
Bicarbonate: 28.2 mmol/L — ABNORMAL HIGH (ref 20.0–28.0)
Calcium, Ion: 1.23 mmol/L (ref 1.15–1.40)
HCT: 36 % — ABNORMAL LOW (ref 39.0–52.0)
Hemoglobin: 12.2 g/dL — ABNORMAL LOW (ref 13.0–17.0)
O2 Saturation: 98 %
Potassium: 3.4 mmol/L — ABNORMAL LOW (ref 3.5–5.1)
Sodium: 141 mmol/L (ref 135–145)
TCO2: 30 mmol/L (ref 22–32)
pCO2 arterial: 50 mmHg — ABNORMAL HIGH (ref 32.0–48.0)
pH, Arterial: 7.36 (ref 7.350–7.450)
pO2, Arterial: 105 mmHg (ref 83.0–108.0)

## 2021-02-24 LAB — POCT I-STAT EG7
Acid-Base Excess: 2 mmol/L (ref 0.0–2.0)
Bicarbonate: 28.7 mmol/L — ABNORMAL HIGH (ref 20.0–28.0)
Calcium, Ion: 1.25 mmol/L (ref 1.15–1.40)
HCT: 36 % — ABNORMAL LOW (ref 39.0–52.0)
Hemoglobin: 12.2 g/dL — ABNORMAL LOW (ref 13.0–17.0)
O2 Saturation: 69 %
Potassium: 3.4 mmol/L — ABNORMAL LOW (ref 3.5–5.1)
Sodium: 142 mmol/L (ref 135–145)
TCO2: 30 mmol/L (ref 22–32)
pCO2, Ven: 54.2 mmHg (ref 44.0–60.0)
pH, Ven: 7.332 (ref 7.250–7.430)
pO2, Ven: 39 mmHg (ref 32.0–45.0)

## 2021-02-24 SURGERY — RIGHT/LEFT HEART CATH AND CORONARY ANGIOGRAPHY
Anesthesia: LOCAL

## 2021-02-24 MED ORDER — SODIUM CHLORIDE 0.9% FLUSH: 3.0000 mL | Freq: Two times a day (BID) | INTRAVENOUS | Status: DC

## 2021-02-24 MED ORDER — SODIUM CHLORIDE 0.9 % IV SOLN: 250.0000 mL | INTRAVENOUS | Status: DC | PRN

## 2021-02-24 MED ORDER — LIDOCAINE HCL (PF) 1 % IJ SOLN
INTRAMUSCULAR | Status: DC | PRN
  Administered 2021-02-24 (×2): 2 mL

## 2021-02-24 MED ORDER — HEPARIN (PORCINE) IN NACL 1000-0.9 UT/500ML-% IV SOLN
INTRAVENOUS | Status: DC | PRN
  Administered 2021-02-24 (×2): 500 mL

## 2021-02-24 MED ORDER — SODIUM CHLORIDE 0.9% FLUSH: 3.0000 mL | INTRAVENOUS | Status: DC | PRN

## 2021-02-24 MED ORDER — LIDOCAINE HCL (PF) 1 % IJ SOLN
INTRAMUSCULAR | Status: AC
  Filled 2021-02-24: qty 30

## 2021-02-24 MED ORDER — VERAPAMIL HCL 2.5 MG/ML IV SOLN
INTRAVENOUS | Status: DC | PRN
  Administered 2021-02-24: 10 mL via INTRA_ARTERIAL

## 2021-02-24 MED ORDER — SODIUM CHLORIDE 0.9 % WEIGHT BASED INFUSION: 1.0000 mL/kg/h | INTRAVENOUS | Status: DC

## 2021-02-24 MED ORDER — SODIUM CHLORIDE 0.9 % WEIGHT BASED INFUSION
3.0000 mL/kg/h | INTRAVENOUS | Status: AC
  Administered 2021-02-24: 3 mL/kg/h via INTRAVENOUS

## 2021-02-24 MED ORDER — IOHEXOL 350 MG/ML SOLN
INTRAVENOUS | Status: DC | PRN
  Administered 2021-02-24: 35 mL

## 2021-02-24 MED ORDER — HEPARIN SODIUM (PORCINE) 1000 UNIT/ML IJ SOLN
INTRAMUSCULAR | Status: DC | PRN
  Administered 2021-02-24: 5000 [IU] via INTRAVENOUS

## 2021-02-24 MED ORDER — HEPARIN SODIUM (PORCINE) 1000 UNIT/ML IJ SOLN
INTRAMUSCULAR | Status: AC
  Filled 2021-02-24: qty 1

## 2021-02-24 MED ORDER — VERAPAMIL HCL 2.5 MG/ML IV SOLN
INTRAVENOUS | Status: AC
  Filled 2021-02-24: qty 2

## 2021-02-24 MED ORDER — ASPIRIN 81 MG PO CHEW: 81.0000 mg | CHEWABLE_TABLET | ORAL | Status: DC

## 2021-02-24 MED ORDER — HEPARIN (PORCINE) IN NACL 1000-0.9 UT/500ML-% IV SOLN
INTRAVENOUS | Status: AC
  Filled 2021-02-24: qty 1000

## 2021-02-24 MED ORDER — MIDAZOLAM HCL 2 MG/2ML IJ SOLN
INTRAMUSCULAR | Status: AC
  Filled 2021-02-24: qty 2

## 2021-02-24 MED ORDER — MIDAZOLAM HCL 2 MG/2ML IJ SOLN
INTRAMUSCULAR | Status: DC | PRN
  Administered 2021-02-24: 1 mg via INTRAVENOUS

## 2021-02-24 SURGICAL SUPPLY — 11 items

## 2021-02-24 NOTE — Interval H&P Note (Signed)
History and Physical Interval Note:  02/24/2021 10:38 AM  Austin Gray.  has presented today for surgery, with the diagnosis of mitral reg.  The various methods of treatment have been discussed with the patient and family. After consideration of risks, benefits and other options for treatment, the patient has consented to  Procedure(s): RIGHT/LEFT HEART CATH AND CORONARY ANGIOGRAPHY (N/A) as a surgical intervention.  The patient's history has been reviewed, patient examined, no change in status, stable for surgery.  I have reviewed the patient's chart and labs.  Questions were answered to the patient's satisfaction.     Sherren Mocha

## 2021-02-24 NOTE — Discharge Instructions (Signed)

## 2021-02-25 ENCOUNTER — Encounter (HOSPITAL_COMMUNITY): Payer: Self-pay | Admitting: Cardiovascular Disease

## 2021-02-26 ENCOUNTER — Telehealth: Payer: Self-pay | Admitting: Cardiovascular Disease

## 2021-02-26 NOTE — Telephone Encounter (Signed)
Patient had a procedure done by Dr. Burt Knack on Wed 02/24/21.  The patient said he has a bruise or something right behind his wrist. It doesn't hurt  or anything, his wrist just feels tight. He just took a bandage off . He is not sure what to do

## 2021-02-26 NOTE — Telephone Encounter (Signed)
Spoke with patient.  His wrist cath site is tender w a penny-sized bruise.  His family member thinks it looks tight/swollen but does not feel that way to him.  He may take a tylenol for the tenderness.  He knows to not lift anything >5 pounds for now. Will call if any further concerns.

## 2021-03-01 ENCOUNTER — Other Ambulatory Visit: Payer: Self-pay

## 2021-03-01 ENCOUNTER — Encounter (HOSPITAL_COMMUNITY): Payer: Self-pay | Admitting: Dentistry

## 2021-03-01 ENCOUNTER — Ambulatory Visit (INDEPENDENT_AMBULATORY_CARE_PROVIDER_SITE_OTHER): Payer: Self-pay | Admitting: Dentistry

## 2021-03-01 DIAGNOSIS — Z01818 Encounter for other preprocedural examination: Secondary | ICD-10-CM

## 2021-03-01 DIAGNOSIS — K0530 Chronic periodontitis, unspecified: Secondary | ICD-10-CM

## 2021-03-01 DIAGNOSIS — K083 Retained dental root: Secondary | ICD-10-CM

## 2021-03-01 DIAGNOSIS — Z7901 Long term (current) use of anticoagulants: Secondary | ICD-10-CM

## 2021-03-01 DIAGNOSIS — K036 Deposits [accretions] on teeth: Secondary | ICD-10-CM

## 2021-03-01 DIAGNOSIS — K08109 Complete loss of teeth, unspecified cause, unspecified class: Secondary | ICD-10-CM

## 2021-03-01 DIAGNOSIS — K045 Chronic apical periodontitis: Secondary | ICD-10-CM

## 2021-03-01 DIAGNOSIS — M270 Developmental disorders of jaws: Secondary | ICD-10-CM

## 2021-03-01 DIAGNOSIS — I340 Nonrheumatic mitral (valve) insufficiency: Secondary | ICD-10-CM

## 2021-03-01 DIAGNOSIS — K030 Excessive attrition of teeth: Secondary | ICD-10-CM

## 2021-03-01 DIAGNOSIS — K029 Dental caries, unspecified: Secondary | ICD-10-CM

## 2021-03-01 DIAGNOSIS — K0889 Other specified disorders of teeth and supporting structures: Secondary | ICD-10-CM

## 2021-03-01 DIAGNOSIS — M264 Malocclusion, unspecified: Secondary | ICD-10-CM

## 2021-03-01 NOTE — Progress Notes (Signed)
Department of Dental Medicine      OUTPATIENT CONSULT  Service Date:   03/01/2021  Patient Name:   Austin Gray. Date of Birth:   1932-01-08 Medical Record Number: [id number]  Referring Provider:                Sherren Mocha, M.D.        TODAY'S VISIT:   Assessment:   There are no current signs of acute odontogenic infection including abscess, edema or erythema, or suspicious lesion requiring biopsy.   There are several retained root tips, teeth with severe decay and chronic periapical infection.  Recommendations:   Extractions of all indicated teeth to decrease the risk of perioperative and postoperative systemic infection and complications.   Plan:   Discuss case with medical team and coordinate treatment as needed.   Plan to complete all dental treatment in the operating room under general anesthesia pending medical team's recommendations.  Discussed in detail all treatment options and recommendations with the patient and they are agreeable to the plan.    Thank you for consulting with [facility] and for the opportunity to participate in this patient's treatment.  Should you have any questions or concerns, please contact the [facility] at [phone].        PROGRESS NOTE:   COVID-19 SCREENING:  The patient denies symptoms concerning for COVID-19 infection including fever, chills, cough, or newly developed shortness of breath.   HISTORY OF PRESENT ILLNESS: Austin Gray. is a very pleasant 87 y.o. male with h/o chronic atrial fibrillation (on Xarelto), HTN, chronic diastolic heart failure, GERD, and has cardiac pacemaker in situ (2020) who was recently diagnosed with severe mitral regurgitation and is anticipating MitraClip surgery.  The patient presents today for a medically necessary dental consultation as part of their pre-cardiac surgery work-up.   DENTAL HISTORY: The patient reports that the last time he went to the dentist was  in the 1970's for a filling.  He currently denies any dental/orofacial pain or sensitivity. Patient is able to manage oral secretions.  Patient denies dysphagia, odynophagia, dysphonia, SOB and neck pain.  Patient denies fever, rigors and malaise.   CHIEF COMPLAINT:  Here for a preoperative dental exam.   Patient Active Problem List   Diagnosis Date Noted   Chronic atrial fibrillation ([place]) 06/29/2020   Permanent atrial fibrillation ([place]) 10/27/2019   Cardiac pacemaker in situ 10/27/2019   Educated about COVID-19 virus infection 10/27/2019   Heart block AV complete ([place])    PAF (paroxysmal atrial fibrillation) ([place]) 09/28/2018   Dyslipidemia 09/28/2018   Chronic diastolic heart failure ([place]) 11/23/2017   AKI (acute kidney injury) ([place]) 11/23/2017   Morbid obesity ([place]) 11/23/2017   Leg swelling 11/23/2017   Urinary tract infection 09/07/2017   Hyponatremia 09/07/2017   Dyspnea 09/07/2017   Bradycardia 09/05/2017   Non-rheumatic mitral regurgitation 02/22/2017   Atrial fibrillation, persistent ([place]) [id number]   Systolic murmur [id number]   Lower GI bleeding 02/15/2012   Hypotension 02/15/2012   History of BPH    Diverticulosis    Colon polyps    Visual impairment    Hypertension    Acute blood loss anemia    Past Medical History:  Diagnosis Date   Anxiety    Arthritis    "little bit; left knee"   Atrial fibrillation (HCC)    Chronic   Basal cell carcinoma    Cataracts, bilateral    to have left eye cataract surgery on  03/21/17    CHF (congestive heart failure), NYHA class II, chronic, diastolic (HCC)    Colon polyps    last colonoscopy 2015 (tubular adenoma)   Depression    Diverticulosis    Gastritis    GERD (gastroesophageal reflux disease)    H/O: GI bleed    Recurrent   Hemorrhoids    History of BPH    History of kidney stones    hx of x 1    Hypertension    Pernicious anemia    PVC (premature ventricular contraction)    Rectal bleeding 02/15/12    Vitamin D deficiency    Past Surgical History:  Procedure Laterality Date   COLONOSCOPY WITH PROPOFOL N/A 03/17/2017   Procedure: COLONOSCOPY WITH PROPOFOL;  Surgeon: Carol Ada, MD;  Location: [facility];  Service: Endoscopy;  Laterality: N/A;   [place]; ~ 2002   left; right   PACEMAKER IMPLANT N/A 03/11/2019   Procedure: PACEMAKER IMPLANT;  Surgeon: Constance Haw, MD;  Location: [facility];  Service: Cardiovascular;  Laterality: N/A;   RIGHT/LEFT HEART CATH AND CORONARY ANGIOGRAPHY N/A 02/24/2021   Procedure: RIGHT/LEFT HEART CATH AND CORONARY ANGIOGRAPHY;  Surgeon: Sherren Mocha, MD;  Location: [facility];  Service: Cardiovascular;  Laterality: N/A;   TEE WITHOUT CARDIOVERSION N/A 12/23/2020   Procedure: TRANSESOPHAGEAL ECHOCARDIOGRAM (TEE);  Surgeon: Freada Bergeron, MD;  Location: [facility] ENDOSCOPY;  Service: Cardiovascular;  Laterality: N/A;   TEMPORARY PACEMAKER N/A 03/10/2019   Procedure: TEMPORARY PACEMAKER;  Surgeon: Martinique, Peter M, MD;  Location: [facility];  Service: Cardiovascular;  Laterality: N/A;   No Known Allergies Current Outpatient Medications  Medication Sig Dispense Refill   albuterol (VENTOLIN HFA) 108 (90 Base) MCG/ACT inhaler INHALE 2 PUFFS EVERY 6 HOURS AS NEEDED FOR WHEEZING OR SHORTNESS OF BREATH 18 g 3   amLODipine (NORVASC) 10 MG tablet TAKE 1 TABLET EVERY DAY 90 tablet 3   carvedilol (COREG) 25 MG tablet Take 1 tablet (25 mg total) by mouth 2 (two) times daily. 180 tablet 1   ferrous sulfate 325 (65 FE) MG tablet Take 325 mg by mouth every other day.      fluticasone (FLONASE) 50 MCG/ACT nasal spray USE 1 SPRAY IN EACH NOSTRIL EVERY DAY 32 g 1   furosemide (LASIX) 40 MG tablet TAKE 2 TABLETS ('80MG'$ ) EVERY MORNING AND 1 TABLET ('40MG'$ ) EVERY EVENING 270 tablet 1   losartan (COZAAR) 50 MG tablet TAKE 1 TABLET EVERY DAY 90 tablet 3   Multiple Vitamin (MULTIVITAMIN) tablet Take 1 tablet by mouth daily.      multivitamin-lutein (OCUVITE-LUTEIN) CAPS Take 1 capsule by mouth daily.     potassium chloride (KLOR-CON) 10 MEQ tablet TAKE 1 TABLET EVERY DAY 90 tablet 3   vitamin B-12 (CYANOCOBALAMIN) 1000 MCG tablet TAKE 1 TABLET EVERY DAY 90 tablet 3   XARELTO 20 MG TABS tablet TAKE 1 TABLET EVERY DAY WITH SUPPER 90 tablet 3   No current facility-administered medications for this visit.    LABS: Lab Results  Component Value Date   WBC 7.1 02/18/2021   HGB 12.2 (L) 02/24/2021   HGB 12.2 (L) 02/24/2021   HCT 36.0 (L) 02/24/2021   HCT 36.0 (L) 02/24/2021   MCV 85 02/18/2021   PLT 114 (L) 02/18/2021      Component Value Date/Time   NA 142 02/24/2021 1059   NA 141 02/24/2021 1059   NA 141 02/18/2021 1439   K 3.4 (L)  02/24/2021 1059   K 3.4 (L) 02/24/2021 1059   CL 102 02/18/2021 1439   CO2 30 (H) 02/18/2021 1439   GLUCOSE 119 (H) 02/18/2021 1439   GLUCOSE 129 (H) 12/01/2020 0808   BUN 10 02/18/2021 1439   CREATININE 0.75 (L) 02/18/2021 1439   CREATININE 0.94 12/01/2020 0808   CALCIUM 9.0 02/18/2021 1439   GFRNONAA 73 12/01/2020 0808   GFRAA 85 12/01/2020 0808   Lab Results  Component Value Date   INR 1.13 02/15/2012   No results found for: PTT  Social History   Socioeconomic History   Marital status: Widowed    Spouse name: Not on file   Number of children: 1   Years of education: Not on file   Highest education level: Not on file  Occupational History   Occupation: Retired  Tobacco Use   Smoking status: Never   Smokeless tobacco: Former    Types: Snuff    Quit date: 01/26/2018   Tobacco comments:    02/15/12 "still use snuff @ times; not daily"  Vaping Use   Vaping Use: Never used  Substance and Sexual Activity   Alcohol use: No   Drug use: No   Sexual activity: Never    Comment: widower, lives with granddaughter and great grandchildren.  Other Topics Concern   Not on file  Social History Narrative   Lives with grand daughter.         Epworth Sleepiness Scale  = 7 (as of 09/21/2015)   Social Determinants of Health   Financial Resource Strain: Low Risk    Difficulty of Paying Living Expenses: Not very hard  Food Insecurity: Not on file  Transportation Needs: Not on file  Physical Activity: Not on file  Stress: Not on file  Social Connections: Not on file  Intimate Partner Violence: Not on file   Family History  Problem Relation Age of Onset   Stroke Father 38   Cancer Mother     REVIEW OF SYSTEMS:  Reviewed with the patient as per HPI. Psych: Patient denies having dental phobia.   VITAL SIGNS: BP 137/83 (BP Location: Right Arm, Patient Position: Sitting, Cuff Size: Normal)   Pulse 69   Temp 98.4 F (36.9 C) (Oral)    PHYSICAL EXAM: General:  Well-developed, comfortable and in no apparent distress. Neurological:  Alert and oriented to person, place and  time. Extraoral:  Facial symmetry present without any edema or erythema.  No swelling or lymphadenopathy.  (+) TMJ- crepitus on left side upon opening. Intraoral:  Soft tissues appear well-perfused and mucous membranes moist.  FOM and vestibules soft and not raised. Oral cavity without mass or lesion. No signs of infection, parulis, sinus tract, edema or erythema evident upon exam.  (+) Tongue- varicosities on sides of tongue (bilateral);  (+) Bilateral mandibular tori.     DENTAL EXAM: Hard tissue exam completed and charted. Overall impression:  Poor remaining dentition. Oral hygiene:  Poor   Periodontal:  Inflamed, erythematous gingival tissue.  Generalized calculus accumulation.  (+) Mobility- #10 class II, #8 & #9 class III. Caries:  #14, #31  Retained root tips:  #12, #13, #29, #30 Defective restorations: #31 existing amalgam with recurrent decay Removable/fixed prosthodontics:  Patient denies wearing partial dentures. Occlusion:  Unable to assess molar occlusion. Class 2 anterior occlusion with overbite + overjet.  All remaining dentition is non-functional.  Supra-erupted  teeth numbers 4, 5, 14 and 31. Other findings:  (+) Attrition/wear- #4O, #5O, #6-#10 incisal +  multiple surfaces.   RADIOGRAPHIC EXAM:  PAN and Full Mouth Series exposed and interpreted.  Condyles seated bilaterally in fossas.  No evidence of abnormal pathology.  All visualized osseous structures appear WNL. #4, #5, #14 and #31 appear supra-erupted due to no opposing dentition.  Localized severe horizontal bone loss consistent with severe periodontitis.  Radiographic calculus accumulation evident.  Missing teeth, existing restorations. Retained root tips- #12, #13, #29, #30. Caries- #41M&D, #22M. #8, #9, #12, #13, #29 and #30 have periapical radiolucencies.    ASSESSMENT:  1.  Severe mitral regurgitation 2.  Preoperative dental exam 3.  Long-term (current) use of anticoagulation (on Xarelto) 4.  Missing teeth 5.  Caries 6.  Retained root tips 7.  Chronic apical periodontitis 8.  Chronic periodontitis 9.  Accretions on teeth 10.  Attrition/wear 11.  Malocclusion 12.  Mandibular tori 13.  Loose teeth 14.  Defective restorations 15.  Postoperative bleeding risk   PLAN AND RECOMMENDATIONS: I discussed the risks, benefits, and complications of various scenarios with the patient in relationship to their medical and dental conditions, which included systemic infection such as endocarditis, bacteremia or other serious issues that could potentially occur either before, during or after their anticipated heart surgery if dental/oral concerns are not addressed.  I explained that if any chronic or acute dental/oral infection(s) are addressed and subsequently not maintained following medical optimization and recovery, their risk of the previously mentioned complications are just as high and could potentially occur postoperatively.  I explained all significant findings of the dental consultation with the patient including multiple teeth with severe decay, retained roots tips, chronic infection of  tooth roots and his gums, and the recommended care including extractions of all infected teeth, teeth with a poor or hopeless periodontal prognosis and teeth which have cavities that can not be filled with a regular filling because of the severity in order to optimize them for heart surgery from a dental standpoint.  The patient verbalized understanding of all findings, discussion, and recommendations. We then discussed various treatment options to include no treatment, multiple extractions with alveoloplasty, pre-prosthetic surgery as indicated, periodontal therapy, dental restorations, root canal therapy, crown and bridge therapy, implant therapy, and replacement of missing teeth as indicated.  The patient verbalized understanding of all options, and currently wishes to proceed with extractions as indicated.  Recommend that all dental treatment be completed in the operating room under general anesthesia due to the patient's current medical condition and the amount of extractions needed to optimize the patient for heart surgery. Plan to discuss all findings and recommendations with medical team and coordinate future care as needed.  The patient will need to establish care at a dental office of his choice for routine dental care including replacement of missing teeth as needed, cleanings and exams.   All questions and concerns were invited and addressed.  The patient tolerated today's visit well and departed in stable condition.  I spent in excess of 120 minutes during the conduct of this consultation and >50% of this time involved direct face-to-face encounter for counseling and/or coordination of the patient's care.  [place] Benson Norway, D.M.D.

## 2021-03-01 NOTE — Patient Instructions (Signed)
[place] Department of Dental Medicine Ariani Seier B. Jeanclaude Wentworth, D.M.D. Phone: [phone] Fax: [phone]   It was a pleasure seeing you today!  Please refer to the information below regarding your dental visit with us.  Call us if any questions or concerns come up after you leave.   Thank you for letting us provide care for you.  If there is anything we can do for you, please let us know.    HEART VALVES AND MOUTH CARE   FACTS: If you have any infection in your mouth, it can infect your heart valve. If you heart valve is infected, you will be seriously ill. Infections in the mouth can be SILENT and do not always cause pain. Examples of infections in the mouth are gum disease, dental cavities, and abscesses. Some possible signs of infection are: Bad breath, bleeding gums, or teeth that are sensitive to sweets, hot, and/or cold. There are many other signs as well.   WHAT YOU HAVE TO DO: Brush your teeth after meals and at bedtime.  Spend at least 2 minutes brushing well, especially behind your back teeth and all around your teeth that stand alone.  Brush at the gumline also. Do not go to bed without brushing your teeth and flossing. If your gums bleed when you brush or floss, do NOT stop brushing or flossing.  Bleeding can be a sign of inflammation or irritation from bacteria.  It usually means that your gums need more attention and better cleaning.  If your dentist or Dr. Jovi Alvizo gave you a prescription mouthwash to use, make sure to use it as directed. If you run out of the medication, get a refill at the pharmacy. If you were given any other medications or directions by your dentist, please follow them.  If you did not understand the directions or forget what you were told, please call.  We will be happy to refresh your memory. If you need antibiotics before dental procedures, make sure you take them one hour prior to every dental visit as directed.  Get a dental check-up every 4-6  months in order to keep your mouth healthy, or to find and treat any new infection. You will most likely need your teeth cleaned or gums treated at the same time. If you are not able to come in for your scheduled appointment, call your dentist as soon as possible to reschedule. If you have a problem in between dental visits, call your dentist.   QUESTIONS? Call our office during office hours [phone].    WE ARE A TEAM.  OUR GOAL IS:  HEALTHY MOUTH, HEALTHY HEART   

## 2021-03-12 ENCOUNTER — Encounter: Payer: Self-pay | Admitting: Thoracic Surgery (Cardiothoracic Vascular Surgery)

## 2021-03-12 ENCOUNTER — Institutional Professional Consult (permissible substitution): Payer: Medicare HMO | Admitting: Thoracic Surgery (Cardiothoracic Vascular Surgery)

## 2021-03-12 ENCOUNTER — Other Ambulatory Visit: Payer: Self-pay

## 2021-03-12 VITALS — BP 124/79 | HR 72 | Resp 20 | Ht 70.0 in | Wt 217.0 lb

## 2021-03-12 DIAGNOSIS — I059 Rheumatic mitral valve disease, unspecified: Secondary | ICD-10-CM

## 2021-03-12 NOTE — Progress Notes (Signed)
[address]       [place] [address]             [id number]        [name] Bennett Medical Record [id number] Date of Birth: 21-May-1932  Referring: Sherren Mocha, MD Primary Care: [name], MD Primary Cardiologist:James Percival Spanish, MD  Chief Complaint:    Chief Complaint  Patient presents with   Mitral Regurgitation    Surgical consult, ECHO 12/23/20 and Cardiac Cath 02/24/21    History of Present Illness:     87 year old male presents for surgical evaluation of severe mitral valve regurgitation.  He has previously been evaluated by Dr.Cooper for MitraClip.  He has a history of persistent atrial fibrillation, complete heart block status post pacemaker placement.  He also has diastolic heart failure and high blood pressure.  He presents today in a wheelchair.  He is only able to walk short distances, and has shortness of breath with exertion.    Past Medical History:  Diagnosis Date   Anxiety    Arthritis    "little bit; left knee"   Atrial fibrillation (HCC)    Chronic   Basal cell carcinoma    Cataracts, bilateral    to have left eye cataract surgery on 03/21/17    CHF (congestive heart failure), NYHA class II, chronic, diastolic (HCC)    Colon polyps    last colonoscopy 2015 (tubular adenoma)   Depression    Diverticulosis    Gastritis    GERD (gastroesophageal reflux disease)    H/O: GI bleed    Recurrent   Hemorrhoids    History of BPH    History of kidney stones    hx of x 1    Hypertension    Pernicious anemia    PVC (premature ventricular contraction)    Rectal bleeding 02/15/12   Vitamin D deficiency     Past Surgical History:  Procedure Laterality Date   COLONOSCOPY WITH PROPOFOL N/A 03/17/2017   Procedure: COLONOSCOPY WITH PROPOFOL;  Surgeon: Carol Ada, MD;  Location: [facility];  Service: Endoscopy;  Laterality: N/A;   [place]; ~ 2002   left; right   PACEMAKER IMPLANT N/A  03/11/2019   Procedure: PACEMAKER IMPLANT;  Surgeon: Constance Haw, MD;  Location: [facility];  Service: Cardiovascular;  Laterality: N/A;   RIGHT/LEFT HEART CATH AND CORONARY ANGIOGRAPHY N/A 02/24/2021   Procedure: RIGHT/LEFT HEART CATH AND CORONARY ANGIOGRAPHY;  Surgeon: Sherren Mocha, MD;  Location: [facility];  Service: Cardiovascular;  Laterality: N/A;   TEE WITHOUT CARDIOVERSION N/A 12/23/2020   Procedure: TRANSESOPHAGEAL ECHOCARDIOGRAM (TEE);  Surgeon: Freada Bergeron, MD;  Location: [facility] ENDOSCOPY;  Service: Cardiovascular;  Laterality: N/A;   TEMPORARY PACEMAKER N/A 03/10/2019   Procedure: TEMPORARY PACEMAKER;  Surgeon: Martinique, Peter M, MD;  Location: [place] [facility];  Service: Cardiovascular;  Laterality: N/A;      Social History   Tobacco Use  Smoking Status Never  Smokeless Tobacco Former   Types: Snuff   Quit date: 01/26/2018  Tobacco Comments   02/15/12 "still use snuff @ times; not daily"    Social History   Substance and Sexual Activity  Alcohol Use No     No Known Allergies    Current Outpatient Medications  Medication Sig Dispense Refill   albuterol (VENTOLIN HFA) 108 (90 Base) MCG/ACT inhaler INHALE 2 PUFFS EVERY 6 HOURS AS NEEDED FOR  WHEEZING OR SHORTNESS OF BREATH 18 g 3   amLODipine (NORVASC) 10 MG tablet TAKE 1 TABLET EVERY DAY 90 tablet 3   ferrous sulfate 325 (65 FE) MG tablet Take 325 mg by mouth every other day.      fluticasone (FLONASE) 50 MCG/ACT nasal spray USE 1 SPRAY IN EACH NOSTRIL EVERY DAY 32 g 1   furosemide (LASIX) 40 MG tablet TAKE 2 TABLETS ('80MG'$ ) EVERY MORNING AND 1 TABLET ('40MG'$ ) EVERY EVENING 270 tablet 1   losartan (COZAAR) 50 MG tablet TAKE 1 TABLET EVERY DAY 90 tablet 3   Multiple Vitamin (MULTIVITAMIN) tablet Take 1 tablet by mouth daily.     multivitamin-lutein (OCUVITE-LUTEIN) CAPS Take 1 capsule by mouth daily.     potassium chloride (KLOR-CON) 10 MEQ tablet TAKE 1 TABLET EVERY DAY 90 tablet 3    vitamin B-12 (CYANOCOBALAMIN) 1000 MCG tablet TAKE 1 TABLET EVERY DAY 90 tablet 3   XARELTO 20 MG TABS tablet TAKE 1 TABLET EVERY DAY WITH SUPPER 90 tablet 3   carvedilol (COREG) 25 MG tablet Take 1 tablet (25 mg total) by mouth 2 (two) times daily. 180 tablet 1   No current facility-administered medications for this visit.    (Not in a hospital admission)   Family History  Problem Relation Age of Onset   Stroke Father 18   Cancer Mother      Review of Systems:   Review of Systems  Constitutional:  Positive for malaise/fatigue.  Respiratory:  Positive for shortness of breath.   Cardiovascular:  Positive for orthopnea and leg swelling. Negative for chest pain.  Neurological: Negative.      Physical Exam: BP 124/79   Pulse 72   Resp 20   Ht '5\' 10"'$  (1.778 m)   Wt 217 lb (98.4 kg)   SpO2 95% Comment: RA  BMI 31.14 kg/m  Physical Exam Constitutional:      General: He is not in acute distress.    Appearance: He is ill-appearing. He is not toxic-appearing or diaphoretic.     Comments: Frail  HENT:     Head: Normocephalic and atraumatic.  Eyes:     Extraocular Movements: Extraocular movements intact.  Cardiovascular:     Rate and Rhythm: Normal rate.     Heart sounds: Murmur heard.  Pulmonary:     Effort: Pulmonary effort is normal. No respiratory distress.  Abdominal:     General: There is no distension.  Skin:    General: Skin is warm and dry.  Neurological:     General: No focal deficit present.     Mental Status: He is alert and oriented to person, place, and time.      Diagnostic Studies & Laboratory data:    Left Heart Catherization: 30% proximal RCA stenosis. Echo: TEE from 12/23/2020 IMPRESSIONS     1. The mitral valve is myxomatous. There is chordal rutpure with P2 flail  and P3 prolapse with resultant severe, highly eccentric anteriorly  directed mitral regurgitation. EROA 0.4cm2 with RVol 1m by PISA. No  evidence of mitral stenosis.   2. Left  ventricular ejection fraction, by estimation, is 55 to 60%. The  left ventricle has normal function.   3. Right ventricular systolic function is mildly reduced. The right  ventricular size is moderately enlarged. RV pacing wire is present. RVSP  is 310mg+RAP.   4. Left atrial size was severely dilated. No left atrial/left atrial  appendage thrombus was detected.   5. Right atrial size was severely  dilated.   6. Tricuspid valve regurgitation is severe. Suspect functional due to  annular dilation.   7. The aortic valve is tricuspid. There is mild calcification of the  aortic valve. There is mild thickening of the aortic valve. Aortic valve  regurgitation is trivial. Mild aortic valve sclerosis is present, with no  evidence of aortic valve stenosis.   8. There is Moderate (Grade III) plaque.    I have independently reviewed the above radiologic studies and discussed with the patient   Recent Lab Findings: Lab Results  Component Value Date   WBC 7.1 02/18/2021   HGB 12.2 (L) 02/24/2021   HGB 12.2 (L) 02/24/2021   HCT 36.0 (L) 02/24/2021   HCT 36.0 (L) 02/24/2021   PLT 114 (L) 02/18/2021   GLUCOSE 119 (H) 02/18/2021   CHOL 179 12/01/2020   TRIG 58 12/01/2020   HDL 62 12/01/2020   LDLCALC 103 (H) 12/01/2020   ALT 18 12/01/2020   AST 25 12/01/2020   NA 142 02/24/2021   NA 141 02/24/2021   K 3.4 (L) 02/24/2021   K 3.4 (L) 02/24/2021   CL 102 02/18/2021   CREATININE 0.75 (L) 02/18/2021   BUN 10 02/18/2021   CO2 30 (H) 02/18/2021   TSH 1.67 08/24/2015   INR 1.13 02/15/2012   HGBA1C 6.1 (H) 08/10/2018      Assessment / Plan:   87 year old male with severe mitral valve regurgitation.  21st impression he appears very frail, and likely would not do well with surgical repair.  On review of his echocardiogram I think that he has disease its amenable to percutaneous intervention, and I agree that this is the best option for this patient given that he is too high risk for  surgery.      I  spent 40 minutes counseling the patient face to face.   Lajuana Matte 03/12/2021 3:28 PM

## 2021-03-12 NOTE — H&P (View-Only) (Signed)
[address]       [place] [address]             [phone]        [name] [place] Medical Record [id number] Date of Birth: 1932/01/06  Referring: Sherren Mocha, MD Primary Care: [name], MD Primary Cardiologist:James Percival Spanish, MD  Chief Complaint:    Chief Complaint  Patient presents with   Mitral Regurgitation    Surgical consult, ECHO 12/23/20 and Cardiac Cath 02/24/21    History of Present Illness:     87 year old male presents for surgical evaluation of severe mitral valve regurgitation.  He has previously been evaluated by Dr.Cooper for MitraClip.  He has a history of persistent atrial fibrillation, complete heart block status post pacemaker placement.  He also has diastolic heart failure and high blood pressure.  He presents today in a wheelchair.  He is only able to walk short distances, and has shortness of breath with exertion.    Past Medical History:  Diagnosis Date   Anxiety    Arthritis    "little bit; left knee"   Atrial fibrillation (HCC)    Chronic   Basal cell carcinoma    Cataracts, bilateral    to have left eye cataract surgery on 03/21/17    CHF (congestive heart failure), NYHA class II, chronic, diastolic (HCC)    Colon polyps    last colonoscopy 2015 (tubular adenoma)   Depression    Diverticulosis    Gastritis    GERD (gastroesophageal reflux disease)    H/O: GI bleed    Recurrent   Hemorrhoids    History of BPH    History of kidney stones    hx of x 1    Hypertension    Pernicious anemia    PVC (premature ventricular contraction)    Rectal bleeding 02/15/12   Vitamin D deficiency     Past Surgical History:  Procedure Laterality Date   COLONOSCOPY WITH PROPOFOL N/A 03/17/2017   Procedure: COLONOSCOPY WITH PROPOFOL;  Surgeon: Carol Ada, MD;  Location: [facility];  Service: Endoscopy;  Laterality: N/A;   [place]; ~ 2002   left; right   PACEMAKER IMPLANT N/A  03/11/2019   Procedure: PACEMAKER IMPLANT;  Surgeon: Constance Haw, MD;  Location: [place] [facility];  Service: Cardiovascular;  Laterality: N/A;   RIGHT/LEFT HEART CATH AND CORONARY ANGIOGRAPHY N/A 02/24/2021   Procedure: RIGHT/LEFT HEART CATH AND CORONARY ANGIOGRAPHY;  Surgeon: Sherren Mocha, MD;  Location: [place] [facility];  Service: Cardiovascular;  Laterality: N/A;   TEE WITHOUT CARDIOVERSION N/A 12/23/2020   Procedure: TRANSESOPHAGEAL ECHOCARDIOGRAM (TEE);  Surgeon: Freada Bergeron, MD;  Location: [facility];  Service: Cardiovascular;  Laterality: N/A;   TEMPORARY PACEMAKER N/A 03/10/2019   Procedure: TEMPORARY PACEMAKER;  Surgeon: Martinique, Peter M, MD;  Location: [place] [facility];  Service: Cardiovascular;  Laterality: N/A;      Social History   Tobacco Use  Smoking Status Never  Smokeless Tobacco Former   Types: Snuff   Quit date: 01/26/2018  Tobacco Comments   02/15/12 "still use snuff @ times; not daily"    Social History   Substance and Sexual Activity  Alcohol Use No     No Known Allergies    Current Outpatient Medications  Medication Sig Dispense Refill   albuterol (VENTOLIN HFA) 108 (90 Base) MCG/ACT inhaler INHALE 2 PUFFS EVERY 6 HOURS AS NEEDED FOR  WHEEZING OR SHORTNESS OF BREATH 18 g 3   amLODipine (NORVASC) 10 MG tablet TAKE 1 TABLET EVERY DAY 90 tablet 3   ferrous sulfate 325 (65 FE) MG tablet Take 325 mg by mouth every other day.      fluticasone (FLONASE) 50 MCG/ACT nasal spray USE 1 SPRAY IN EACH NOSTRIL EVERY DAY 32 g 1   furosemide (LASIX) 40 MG tablet TAKE 2 TABLETS ('80MG'$ ) EVERY MORNING AND 1 TABLET ('40MG'$ ) EVERY EVENING 270 tablet 1   losartan (COZAAR) 50 MG tablet TAKE 1 TABLET EVERY DAY 90 tablet 3   Multiple Vitamin (MULTIVITAMIN) tablet Take 1 tablet by mouth daily.     multivitamin-lutein (OCUVITE-LUTEIN) CAPS Take 1 capsule by mouth daily.     potassium chloride (KLOR-CON) 10 MEQ tablet TAKE 1 TABLET EVERY DAY 90 tablet 3    vitamin B-12 (CYANOCOBALAMIN) 1000 MCG tablet TAKE 1 TABLET EVERY DAY 90 tablet 3   XARELTO 20 MG TABS tablet TAKE 1 TABLET EVERY DAY WITH SUPPER 90 tablet 3   carvedilol (COREG) 25 MG tablet Take 1 tablet (25 mg total) by mouth 2 (two) times daily. 180 tablet 1   No current facility-administered medications for this visit.    (Not in a hospital admission)   Family History  Problem Relation Age of Onset   Stroke Father 67   Cancer Mother      Review of Systems:   Review of Systems  Constitutional:  Positive for malaise/fatigue.  Respiratory:  Positive for shortness of breath.   Cardiovascular:  Positive for orthopnea and leg swelling. Negative for chest pain.  Neurological: Negative.      Physical Exam: BP 124/79   Pulse 72   Resp 20   Ht '5\' 10"'$  (1.778 m)   Wt 217 lb (98.4 kg)   SpO2 95% Comment: RA  BMI 31.14 kg/m  Physical Exam Constitutional:      General: He is not in acute distress.    Appearance: He is ill-appearing. He is not toxic-appearing or diaphoretic.     Comments: Frail  HENT:     Head: Normocephalic and atraumatic.  Eyes:     Extraocular Movements: Extraocular movements intact.  Cardiovascular:     Rate and Rhythm: Normal rate.     Heart sounds: Murmur heard.  Pulmonary:     Effort: Pulmonary effort is normal. No respiratory distress.  Abdominal:     General: There is no distension.  Skin:    General: Skin is warm and dry.  Neurological:     General: No focal deficit present.     Mental Status: He is alert and oriented to person, place, and time.      Diagnostic Studies & Laboratory data:    Left Heart Catherization: 30% proximal RCA stenosis. Echo: TEE from 12/23/2020 IMPRESSIONS     1. The mitral valve is myxomatous. There is chordal rutpure with P2 flail  and P3 prolapse with resultant severe, highly eccentric anteriorly  directed mitral regurgitation. EROA 0.4cm2 with RVol 12m by PISA. No  evidence of mitral stenosis.   2. Left  ventricular ejection fraction, by estimation, is 55 to 60%. The  left ventricle has normal function.   3. Right ventricular systolic function is mildly reduced. The right  ventricular size is moderately enlarged. RV pacing wire is present. RVSP  is 359mg+RAP.   4. Left atrial size was severely dilated. No left atrial/left atrial  appendage thrombus was detected.   5. Right atrial size was severely  dilated.   6. Tricuspid valve regurgitation is severe. Suspect functional due to  annular dilation.   7. The aortic valve is tricuspid. There is mild calcification of the  aortic valve. There is mild thickening of the aortic valve. Aortic valve  regurgitation is trivial. Mild aortic valve sclerosis is present, with no  evidence of aortic valve stenosis.   8. There is Moderate (Grade III) plaque.    I have independently reviewed the above radiologic studies and discussed with the patient   Recent Lab Findings: Lab Results  Component Value Date   WBC 7.1 02/18/2021   HGB 12.2 (L) 02/24/2021   HGB 12.2 (L) 02/24/2021   HCT 36.0 (L) 02/24/2021   HCT 36.0 (L) 02/24/2021   PLT 114 (L) 02/18/2021   GLUCOSE 119 (H) 02/18/2021   CHOL 179 12/01/2020   TRIG 58 12/01/2020   HDL 62 12/01/2020   LDLCALC 103 (H) 12/01/2020   ALT 18 12/01/2020   AST 25 12/01/2020   NA 142 02/24/2021   NA 141 02/24/2021   K 3.4 (L) 02/24/2021   K 3.4 (L) 02/24/2021   CL 102 02/18/2021   CREATININE 0.75 (L) 02/18/2021   BUN 10 02/18/2021   CO2 30 (H) 02/18/2021   TSH 1.67 08/24/2015   INR 1.13 02/15/2012   HGBA1C 6.1 (H) 08/10/2018      Assessment / Plan:   87 year old male with severe mitral valve regurgitation.  21st impression he appears very frail, and likely would not do well with surgical repair.  On review of his echocardiogram I think that he has disease its amenable to percutaneous intervention, and I agree that this is the best option for this patient given that he is too high risk for  surgery.      I  spent 40 minutes counseling the patient face to face.   Lajuana Matte 03/12/2021 3:28 PM

## 2021-03-15 ENCOUNTER — Telehealth: Payer: Self-pay | Admitting: Cardiovascular Disease

## 2021-03-15 NOTE — Telephone Encounter (Signed)
I spoke with the pt and advised him that his daughter, Estill Batten, had contacted the office to find out information in regards to his surgery. The pt authorized me to speak with his daughter, Estill Batten.  I contacted Clara and made her aware that the pt will be booked for MitraClip on 9/8.

## 2021-03-15 NOTE — Telephone Encounter (Signed)
Clara, Daughter of the patient called. The patient is scheduled to have a procedure done where they will clamp a leaky valve on his heart. The daughter was not sure exactly what the procedure is called. She needs to know when the patient's procedure is because Hiram Gash  on the DPR is actually the patient's Granddaughter, and she is not giving her mother the correct information.  Clara needs to come from [place] and needs to make sure she does not have any schedule conflicts  Clara was informed that she is not on the DPR and only her daughter is. I the office can not speak to her they need to call Clara.

## 2021-03-16 ENCOUNTER — Other Ambulatory Visit: Payer: Self-pay

## 2021-03-16 DIAGNOSIS — I340 Nonrheumatic mitral (valve) insufficiency: Secondary | ICD-10-CM

## 2021-03-17 ENCOUNTER — Encounter (HOSPITAL_COMMUNITY): Payer: Self-pay | Admitting: Dentistry

## 2021-03-17 NOTE — Progress Notes (Signed)
Anesthesia Chart Review:  Case: [id number] Date/Time: 03/18/21 0930   Procedure: MULTIPLE EXTRACTION WITH ALVEOLOPLASTY   Anesthesia type: General   Pre-op diagnosis: DENTAL CARIES   Location: [facility] OR ROOM 08 / [place] [facility]   Surgeons: Charlaine Dalton, DMD       DISCUSSION: Patient is an 87 year old male scheduled for the above procedure.  He has had recent cardiology and CT surgery evaluations for severe mitral regurgitation.  Percutaneous/MitraClip intervention is felt to be the best option. He was referred for dental surgery prior to undergoing valvular procedure.  History includes never smoker (quit smokeless tobacco 01/26/18), HTN, severe mitral regurgitation, CHF, PVCs, afib, symptomatic bradycardia/CHB (s/p St Jude Medical Assurity MRI dual-chamber PPM 03/11/19), GERD, anemia (pernicious, GI bleed), skin cancer (BCC), BPH.   Last Abbott/St. Jude PPM remote interrogation 12/23/20"  "Scheduled remote reviewed. Normal device function. No new episodes. Next remote 91 days."  Patient's daughter to contact Dr. Benson Norway to confirm perioperative Xarelto instructions.   He has had labs and an EKG within the past 30 days.  Anesthesia team to evaluate on the day of surgery.   VS:  BP Readings from Last 3 Encounters:  03/12/21 124/79  03/01/21 137/83  02/24/21 122/72   Pulse Readings from Last 3 Encounters:  03/12/21 72  03/01/21 69  02/24/21 71      PROVIDERS: [name], MD is PCP Minus Breeding, MD is primary cardiologist Sherren Mocha, MD is structural heart cardiologist   LABS: Most recent lab results include: Lab Results  Component Value Date   WBC 7.1 02/18/2021   HGB 12.2 (L) 02/24/2021   HCT 36.0 (L) 02/24/2021   PLT 114 (L) 02/18/2021   GLUCOSE 119 (H) 02/18/2021   ALT 18 12/01/2020   AST 25 12/01/2020   NA 142 02/24/2021   NA 141 02/24/2021   K 3.4 (L) 02/24/2021   CL 102 02/18/2021   CREATININE 0.75 (L) 02/18/2021   BUN 10 02/18/2021   CO2 30 (H) 02/18/2021     IMAGES: > 45 year old.    EKG: EKG 02/18/21 ([facility]): Afib at 70 bpm, LBBB.   CV: RHC/LHC 02/24/21:   Prox RCA lesion is 30% stenosed.   1.  Mild diffuse coronary irregularity/plaquing with no significant obstructive disease 2.  Known severe mitral regurgitation with large V waves (28 mmHg) 3.  Normal LV pressure, essentially normal right heart pressures  Plan: Continue evaluation for transcatheter edge-to-edge mitral valve repair    TEE 12/23/20: IMPRESSIONS   1. The mitral valve is myxomatous. There is chordal rutpure with P2 flail  and P3 prolapse with resultant severe, highly eccentric anteriorly  directed mitral regurgitation. EROA 0.4cm2 with RVol 1m by PISA. No  evidence of mitral stenosis.   2. Left ventricular ejection fraction, by estimation, is 55 to 60%. The  left ventricle has normal function.   3. Right ventricular systolic function is mildly reduced. The right  ventricular size is moderately enlarged. RV pacing wire is present. RVSP  is 367mg+RAP.   4. Left atrial size was severely dilated. No left atrial/left atrial  appendage thrombus was detected.   5. Right atrial size was severely dilated.   6. Tricuspid valve regurgitation is severe. Suspect functional due to  annular dilation.   7. The aortic valve is tricuspid. There is mild calcification of the  aortic valve. There is mild thickening of the aortic valve. Aortic valve  regurgitation is trivial. Mild aortic valve sclerosis is present, with no  evidence  of aortic valve stenosis.   8. There is Moderate (Grade III) plaque.    TTE 11/17/20: IMPRESSIONS   1. There is likely severe mitral regurgitation. The jet is eccentric and  anteriorly directed. There is splay artifact suggestive of at least  moderate regurgitation. The MR is poorly quantitated. Most impresive  images are PLAX. Suspect it is just very  eccentric. There is likely a flail PMVL chord to explain these findings,  and abrupt  change from prior echo. Would recommend a TEE for better  characterization. The mitral valve is myxomatous. Severe mitral valve  regurgitation. No evidence of mitral  stenosis.   2. Left ventricular ejection fraction, by estimation, is 55 to 60%. The  left ventricle has normal function. The left ventricle has no regional  wall motion abnormalities. Left ventricular diastolic function could not  be evaluated.   3. Right ventricular systolic function is normal. The right ventricular  size is mildly enlarged. There is mildly elevated pulmonary artery  systolic pressure. The estimated right ventricular systolic pressure is  0000000 mmHg.   4. Left atrial size was severely dilated.   5. Right atrial size was severely dilated.   6. Tricuspid valve regurgitation is mild to moderate.   7. The aortic valve is tricuspid. There is mild calcification of the  aortic valve. Aortic valve regurgitation is trivial. Mild aortic valve  sclerosis is present, with no evidence of aortic valve stenosis.   8. The inferior vena cava is dilated in size with >50% respiratory  variability, suggesting right atrial pressure of 8 mmHg.  - Comparison(s): Changes from prior study are noted. MR is now severe. TEE recommended.   US Carotid 12/22/14: Technically challenging study. Heterogeneous plaque, bilaterally. 1-39% bilateral ICA stenosis. >50% RECA stenosis. Normal subclavian arteries, bilaterally. Patent vertebral arteries with antegrade flow.    Past Medical History:  Diagnosis Date   Anxiety    Arthritis    "little bit; left knee"   Atrial fibrillation (HCC)    Chronic   Basal cell carcinoma    Cataracts, bilateral    to have left eye cataract surgery on 03/21/17    CHF (congestive heart failure), NYHA class II, chronic, diastolic (HCC)    Colon polyps    last colonoscopy 2015 (tubular adenoma)   Depression    Diverticulosis    Gastritis    GERD (gastroesophageal reflux disease)    H/O: GI bleed     Recurrent   Hemorrhoids    History of BPH    History of kidney stones    hx of x 1  passed stones   Hypertension    Pernicious anemia    Presence of permanent cardiac pacemaker    PVC (premature ventricular contraction)    Rectal bleeding 02/15/2012   Vitamin D deficiency     Past Surgical History:  Procedure Laterality Date   COLONOSCOPY WITH PROPOFOL N/A 03/17/2017   Procedure: COLONOSCOPY WITH PROPOFOL;  Surgeon: Carol Ada, MD;  Location: [facility];  Service: Endoscopy;  Laterality: N/A;   [place]; ~ 2002   left; right   PACEMAKER IMPLANT N/A 03/11/2019   Procedure: PACEMAKER IMPLANT;  Surgeon: Constance Haw, MD;  Location: [place] [facility];  Service: Cardiovascular;  Laterality: N/A;   RIGHT/LEFT HEART CATH AND CORONARY ANGIOGRAPHY N/A 02/24/2021   Procedure: RIGHT/LEFT HEART CATH AND CORONARY ANGIOGRAPHY;  Surgeon: Sherren Mocha, MD;  Location: [place] [facility];  Service: Cardiovascular;  Laterality: N/A;  TEE WITHOUT CARDIOVERSION N/A 12/23/2020   Procedure: TRANSESOPHAGEAL ECHOCARDIOGRAM (TEE);  Surgeon: Freada Bergeron, MD;  Location: [facility];  Service: Cardiovascular;  Laterality: N/A;   TEMPORARY PACEMAKER N/A 03/10/2019   Procedure: TEMPORARY PACEMAKER;  Surgeon: Martinique, Peter M, MD;  Location: [place] [facility];  Service: Cardiovascular;  Laterality: N/A;    MEDICATIONS: No current facility-administered medications for this encounter.    albuterol (VENTOLIN HFA) 108 (90 Base) MCG/ACT inhaler   amLODipine (NORVASC) 10 MG tablet   carvedilol (COREG) 25 MG tablet   ferrous sulfate 325 (65 FE) MG tablet   fluticasone (FLONASE) 50 MCG/ACT nasal spray   furosemide (LASIX) 40 MG tablet   losartan (COZAAR) 50 MG tablet   Multiple Vitamin (MULTIVITAMIN) tablet   multivitamin-lutein (OCUVITE-LUTEIN) CAPS   potassium chloride (KLOR-CON) 10 MEQ tablet   vitamin B-12 (CYANOCOBALAMIN) 1000 MCG tablet   XARELTO 20 MG TABS tablet     Myra Gianotti, PA-C Surgical Short Stay/Anesthesiology [facility] Phone [phone] [facility] Phone [phone] 03/17/2021 12:05 PM

## 2021-03-17 NOTE — Anesthesia Preprocedure Evaluation (Addendum)
Anesthesia Evaluation  Patient identified by MRN, date of birth, ID band Patient awake    Reviewed: Allergy & Precautions, NPO status , Patient's Chart, lab work & pertinent test results  History of Anesthesia Complications Negative for: history of anesthetic complications  Airway Mallampati: II  TM Distance: >3 FB Neck ROM: Full    Dental  (+) Dental Advisory Given, Loose, Poor Dentition, Chipped, Missing   Pulmonary neg pulmonary ROS,    Pulmonary exam normal        Cardiovascular hypertension, +CHF  + dysrhythmias Atrial Fibrillation + pacemaker + Valvular Problems/Murmurs (severe MR, planning for mitraclip) MR  Rhythm:Regular Rate:Normal   RHC/LHC 02/24/21: mild diffuse CAD with no significant obstructive disease, severe MR, normal LV pressure, essentially normal right heart pressure  TEE 12/23/20: EF 55-60%; myxomatous MV, chordal rupture with P2 flail and P3 prolapse with resultant severe, highly eccentric anteriorly directed MR; mild reduced RV systolic function, RVSP 39; severe LAD/RAD; severe TR   Neuro/Psych negative neurological ROS     GI/Hepatic Neg liver ROS, GERD  ,  Endo/Other  negative endocrine ROS  Renal/GU negative Renal ROS  negative genitourinary   Musculoskeletal negative musculoskeletal ROS (+)   Abdominal   Peds  Hematology  (+) anemia , Xarelto   Anesthesia Other Findings   Reproductive/Obstetrics                           Anesthesia Physical Anesthesia Plan  ASA: 3  Anesthesia Plan: General   Post-op Pain Management:    Induction: Intravenous  PONV Risk Score and Plan: 2 and Ondansetron, Dexamethasone, Treatment may vary due to age or medical condition and Midazolam  Airway Management Planned: Nasal ETT  Additional Equipment: None  Intra-op Plan:   Post-operative Plan: Extubation in OR  Informed Consent: I have reviewed the patients History and  Physical, chart, labs and discussed the procedure including the risks, benefits and alternatives for the proposed anesthesia with the patient or authorized representative who has indicated his/her understanding and acceptance.     Dental advisory given  Plan Discussed with:   Anesthesia Plan Comments: (PAT note written 03/17/2021 by Myra Gianotti, PA-C. )       Anesthesia Quick Evaluation

## 2021-03-17 NOTE — Progress Notes (Signed)
DUE TO COVID-19 ONLY ONE VISITOR IS ALLOWED TO COME WITH YOU AND STAY IN THE WAITING ROOM ONLY DURING PRE OP AND PROCEDURE DAY OF SURGERY.   Spoke with Daughter Hiram Gash [phone] for PAT information and instructions for DOS.  Unable to reach patient via phone.  PCP - Dr Jenna Luo Cardiologist - Dr Minus Breeding  Chest x-ray - n/a EKG - 02/18/21 Stress Test - n/a ECHO TEE - 12/23/20 Cardiac Cath - 02/24/21  ICD Pacemaker/Loop - Yes, St Jude Assurity 1272 Pacemaker. Last remote device check was on 12/23/20. Rep. Shearon Stalls [phone]) was informed of surgery date and time.  Sleep Study -  n/a CPAP - none  ERAS: Clear liquids til 6:45 am on DOS.  Reviewed clear liquids with patient.  Anesthesia review: Yes  Last dose of xarelto was on - Follow your surgeon's instructions on when to stop xarelto  prior to surgery,  If no instructions were given by your surgeon then you will need to call the office for those instructions.    STOP now taking any Aspirin (unless otherwise instructed by your surgeon), Aleve, Naproxen, Ibuprofen, Motrin, Advil, Goody's, BC's, all herbal medications, fish oil, and all vitamins.   Coronavirus Screening Covid test n/a - Ambulatory Surgery  Do you have any of the following symptoms:  Cough yes/no: No Fever (>100.25F)  yes/no: No Runny nose yes/no: No Sore throat yes/no: No Difficulty breathing/shortness of breath  yes/no: No  Have you traveled in the last 14 days and where? yes/no: No  Daughter Andee Poles verbalized understanding of instructions that were given via phone.

## 2021-03-18 ENCOUNTER — Other Ambulatory Visit: Payer: Self-pay | Admitting: Dentistry

## 2021-03-18 ENCOUNTER — Encounter (HOSPITAL_COMMUNITY): Payer: Self-pay | Admitting: Dentistry

## 2021-03-18 ENCOUNTER — Other Ambulatory Visit: Payer: Self-pay

## 2021-03-18 ENCOUNTER — Encounter (HOSPITAL_COMMUNITY)
Admission: RE | Disposition: A | Discharge status: Home / Self Care | Payer: Self-pay | Source: Home / Self Care | Attending: Dentistry

## 2021-03-18 ENCOUNTER — Ambulatory Visit (HOSPITAL_COMMUNITY)
Admission: RE | Admit: 2021-03-18 | Discharge: 2021-03-18 | Disposition: A | Discharge status: Home / Self Care | Payer: Medicare HMO | Attending: Dentistry | Admitting: Dentistry

## 2021-03-18 ENCOUNTER — Ambulatory Visit (HOSPITAL_COMMUNITY): Payer: Medicare HMO | Admitting: Vascular Surgery

## 2021-03-18 DIAGNOSIS — I4821 Permanent atrial fibrillation: Secondary | ICD-10-CM | POA: Insufficient documentation

## 2021-03-18 DIAGNOSIS — Z7901 Long term (current) use of anticoagulants: Secondary | ICD-10-CM | POA: Insufficient documentation

## 2021-03-18 DIAGNOSIS — Z7951 Long term (current) use of inhaled steroids: Secondary | ICD-10-CM | POA: Insufficient documentation

## 2021-03-18 DIAGNOSIS — Z809 Family history of malignant neoplasm, unspecified: Secondary | ICD-10-CM | POA: Insufficient documentation

## 2021-03-18 DIAGNOSIS — Z823 Family history of stroke: Secondary | ICD-10-CM | POA: Diagnosis not present

## 2021-03-18 DIAGNOSIS — K029 Dental caries, unspecified: Secondary | ICD-10-CM | POA: Insufficient documentation

## 2021-03-18 DIAGNOSIS — I5032 Chronic diastolic (congestive) heart failure: Secondary | ICD-10-CM | POA: Insufficient documentation

## 2021-03-18 DIAGNOSIS — Z950 Presence of cardiac pacemaker: Secondary | ICD-10-CM | POA: Diagnosis not present

## 2021-03-18 DIAGNOSIS — I083 Combined rheumatic disorders of mitral, aortic and tricuspid valves: Secondary | ICD-10-CM | POA: Diagnosis not present

## 2021-03-18 DIAGNOSIS — Z6831 Body mass index (BMI) 31.0-31.9, adult: Secondary | ICD-10-CM | POA: Diagnosis not present

## 2021-03-18 DIAGNOSIS — I110 Hypertensive heart disease with heart failure: Secondary | ICD-10-CM | POA: Insufficient documentation

## 2021-03-18 DIAGNOSIS — Z79899 Other long term (current) drug therapy: Secondary | ICD-10-CM | POA: Insufficient documentation

## 2021-03-18 DIAGNOSIS — K056 Periodontal disease, unspecified: Secondary | ICD-10-CM | POA: Diagnosis not present

## 2021-03-18 DIAGNOSIS — I480 Paroxysmal atrial fibrillation: Secondary | ICD-10-CM | POA: Diagnosis not present

## 2021-03-18 DIAGNOSIS — Z720 Tobacco use: Secondary | ICD-10-CM | POA: Insufficient documentation

## 2021-03-18 HISTORY — PX: MULTIPLE EXTRACTIONS WITH ALVEOLOPLASTY: SHX5342

## 2021-03-18 HISTORY — DX: Presence of cardiac pacemaker: Z95.0

## 2021-03-18 SURGERY — MULTIPLE EXTRACTION WITH ALVEOLOPLASTY
Anesthesia: General | Site: Mouth

## 2021-03-18 MED ORDER — DEXAMETHASONE SODIUM PHOSPHATE 10 MG/ML IJ SOLN
INTRAMUSCULAR | Status: DC | PRN
  Administered 2021-03-18: 10 mg via INTRAVENOUS

## 2021-03-18 MED ORDER — FENTANYL CITRATE (PF) 100 MCG/2ML IJ SOLN: 25.0000 ug | INTRAMUSCULAR | Status: DC | PRN

## 2021-03-18 MED ORDER — ROCURONIUM BROMIDE 10 MG/ML (PF) SYRINGE
PREFILLED_SYRINGE | INTRAVENOUS | Status: DC | PRN
  Administered 2021-03-18: 70 mg via INTRAVENOUS

## 2021-03-18 MED ORDER — AMOXICILLIN 500 MG PO CAPS: 500.0000 mg | ORAL_CAPSULE | Freq: Three times a day (TID) | ORAL | 0 refills | Status: DC

## 2021-03-18 MED ORDER — OXYMETAZOLINE HCL 0.05 % NA SOLN
NASAL | Status: DC | PRN
  Administered 2021-03-18: 1 via TOPICAL

## 2021-03-18 MED ORDER — HEMOSTATIC AGENTS (NO CHARGE) OPTIME
TOPICAL | Status: DC | PRN
  Administered 2021-03-18: 1 via TOPICAL

## 2021-03-18 MED ORDER — LIDOCAINE-EPINEPHRINE 2 %-1:100000 IJ SOLN
INTRAMUSCULAR | Status: DC | PRN
  Administered 2021-03-18: 5.1 mL

## 2021-03-18 MED ORDER — ORAL CARE MOUTH RINSE: 15.0000 mL | Freq: Once | OROMUCOSAL | Status: AC

## 2021-03-18 MED ORDER — OXYCODONE HCL 5 MG/5ML PO SOLN: 5.0000 mg | Freq: Once | ORAL | Status: DC | PRN

## 2021-03-18 MED ORDER — OXYCODONE HCL 5 MG PO TABS: 5.0000 mg | ORAL_TABLET | Freq: Once | ORAL | Status: DC | PRN

## 2021-03-18 MED ORDER — CEFAZOLIN SODIUM-DEXTROSE 2-4 GM/100ML-% IV SOLN
2.0000 g | INTRAVENOUS | Status: AC
  Administered 2021-03-18: 2 g via INTRAVENOUS
  Filled 2021-03-18: qty 100

## 2021-03-18 MED ORDER — FENTANYL CITRATE (PF) 250 MCG/5ML IJ SOLN
INTRAMUSCULAR | Status: DC | PRN
  Administered 2021-03-18: 75 ug via INTRAVENOUS
  Administered 2021-03-18: 25 ug via INTRAVENOUS

## 2021-03-18 MED ORDER — EPHEDRINE SULFATE 50 MG/ML IJ SOLN
INTRAMUSCULAR | Status: DC | PRN
  Administered 2021-03-18: 5 mg via INTRAVENOUS
  Administered 2021-03-18: 10 mg via INTRAVENOUS
  Administered 2021-03-18 (×2): 5 mg via INTRAVENOUS

## 2021-03-18 MED ORDER — SUGAMMADEX SODIUM 200 MG/2ML IV SOLN
INTRAVENOUS | Status: DC | PRN
  Administered 2021-03-18: 200 mg via INTRAVENOUS

## 2021-03-18 MED ORDER — PROPOFOL 10 MG/ML IV BOLUS
INTRAVENOUS | Status: DC | PRN
  Administered 2021-03-18: 50 mg via INTRAVENOUS

## 2021-03-18 MED ORDER — 0.9 % SODIUM CHLORIDE (POUR BTL) OPTIME
TOPICAL | Status: DC | PRN
  Administered 2021-03-18: 1000 mL

## 2021-03-18 MED ORDER — CHLORHEXIDINE GLUCONATE 0.12 % MT SOLN: 15.0000 mL | Freq: Two times a day (BID) | OROMUCOSAL | 0 refills | Status: AC

## 2021-03-18 MED ORDER — LACTATED RINGERS IV SOLN: INTRAVENOUS | Status: DC

## 2021-03-18 MED ORDER — CHLORHEXIDINE GLUCONATE 0.12 % MT SOLN
15.0000 mL | Freq: Once | OROMUCOSAL | Status: AC
  Administered 2021-03-18: 15 mL via OROMUCOSAL
  Filled 2021-03-18: qty 15

## 2021-03-18 MED ORDER — FENTANYL CITRATE (PF) 250 MCG/5ML IJ SOLN
INTRAMUSCULAR | Status: AC
  Filled 2021-03-18: qty 5

## 2021-03-18 MED ORDER — AMISULPRIDE (ANTIEMETIC) 5 MG/2ML IV SOLN: 10.0000 mg | Freq: Once | INTRAVENOUS | Status: DC | PRN

## 2021-03-18 MED ORDER — ONDANSETRON HCL 4 MG/2ML IJ SOLN: 4.0000 mg | Freq: Once | INTRAMUSCULAR | Status: DC | PRN

## 2021-03-18 MED ORDER — LIDOCAINE 2% (20 MG/ML) 5 ML SYRINGE
INTRAMUSCULAR | Status: DC | PRN
  Administered 2021-03-18: 100 mg via INTRAVENOUS

## 2021-03-18 MED ORDER — ONDANSETRON HCL 4 MG/2ML IJ SOLN
INTRAMUSCULAR | Status: DC | PRN
  Administered 2021-03-18: 4 mg via INTRAVENOUS

## 2021-03-18 MED ORDER — LIDOCAINE-EPINEPHRINE 2 %-1:100000 IJ SOLN
INTRAMUSCULAR | Status: AC
  Filled 2021-03-18: qty 5.1

## 2021-03-18 SURGICAL SUPPLY — 32 items
ALCOHOL 70% 16 OZ (MISCELLANEOUS) ×2 IMPLANT
BAG COUNTER SPONGE SURGICOUNT (BAG) ×2 IMPLANT
BLADE SURG 15 STRL LF DISP TIS (BLADE) ×1 IMPLANT
BLADE SURG 15 STRL SS (BLADE) ×2
COVER SURGICAL LIGHT HANDLE (MISCELLANEOUS) ×2 IMPLANT
GAUZE 4X4 16PLY ~~LOC~~+RFID DBL (SPONGE) ×2 IMPLANT
GAUZE PACKING FOLDED 2  STR (GAUZE/BANDAGES/DRESSINGS) ×2
GAUZE PACKING FOLDED 2 STR (GAUZE/BANDAGES/DRESSINGS) ×1 IMPLANT
GLOVE SURG ENC MOIS LTX SZ6.5 (GLOVE) ×2 IMPLANT
GLOVE SURG POLYISO LF SZ6 (GLOVE) ×2 IMPLANT
GOWN STRL REUS W/ TWL LRG LVL3 (GOWN DISPOSABLE) ×2 IMPLANT
GOWN STRL REUS W/TWL LRG LVL3 (GOWN DISPOSABLE) ×4
KIT BASIN OR (CUSTOM PROCEDURE TRAY) ×2 IMPLANT
KIT TURNOVER KIT B (KITS) ×2 IMPLANT
MANIFOLD NEPTUNE II (INSTRUMENTS) ×2 IMPLANT
NEEDLE BLUNT 16X1.5 OR ONLY (NEEDLE) ×2 IMPLANT
NEEDLE DENTAL 27 LONG (NEEDLE) ×4 IMPLANT
NS IRRIG 1000ML POUR BTL (IV SOLUTION) ×2 IMPLANT
PACK EENT II TURBAN DRAPE (CUSTOM PROCEDURE TRAY) ×2 IMPLANT
PAD ARMBOARD 7.5X6 YLW CONV (MISCELLANEOUS) ×2 IMPLANT
SPONGE SURGIFOAM ABS GEL SZ50 (HEMOSTASIS) ×2 IMPLANT
SUCTION FRAZIER HANDLE 10FR (MISCELLANEOUS) ×2
SUCTION TUBE FRAZIER 10FR DISP (MISCELLANEOUS) ×1 IMPLANT
SUT CHROMIC 3 0 PS 2 (SUTURE) ×2 IMPLANT
SUT CHROMIC 4 0 P 3 18 (SUTURE) IMPLANT
SYR 50ML SLIP (SYRINGE) ×2 IMPLANT
SYR BULB IRRIG 60ML STRL (SYRINGE) ×2 IMPLANT
TOWEL GREEN STERILE FF (TOWEL DISPOSABLE) ×2 IMPLANT
TUBE CONNECTING 12X1/4 (SUCTIONS) ×2 IMPLANT
WATER STERILE IRR 1000ML POUR (IV SOLUTION) ×2 IMPLANT
WATER TABLETS ICX (MISCELLANEOUS) IMPLANT
YANKAUER SUCT BULB TIP NO VENT (SUCTIONS) ×2 IMPLANT

## 2021-03-18 NOTE — Interval H&P Note (Signed)
History and Physical Interval Note:  03/18/2021 8:56 AM  Austin Gray.  has presented today for surgery, with the diagnosis of DENTAL CARIES.  The various methods of treatment have been discussed with the patient and family. After consideration of risks, benefits and other options for treatment, the patient has consented to  Procedure(s): MULTIPLE EXTRACTION WITH ALVEOLOPLASTY (N/A) as a surgical intervention.  The patient's history has been reviewed, patient examined, no change in status, stable for surgery.  I have reviewed the patient's chart and labs.  Questions were answered to the patient's satisfaction.     Charlaine Dalton

## 2021-03-18 NOTE — Anesthesia Postprocedure Evaluation (Signed)
Anesthesia Post Note  Patient: Austin Gray.  Procedure(s) Performed: MULTIPLE EXTRACTION WITH ALVEOLOPLASTY (Mouth)     Patient location during evaluation: PACU Anesthesia Type: General Level of consciousness: awake and alert Pain management: pain level controlled Vital Signs Assessment: post-procedure vital signs reviewed and stable Respiratory status: spontaneous breathing, nonlabored ventilation and respiratory function stable Cardiovascular status: blood pressure returned to baseline and stable Postop Assessment: no apparent nausea or vomiting Anesthetic complications: no   No notable events documented.  Last Vitals:  Vitals:   03/18/21 1050 03/18/21 1105  BP: 117/75 118/76  Pulse: 70 70  Resp: 20 18  Temp:  36.5 C  SpO2: 91% 94%    Last Pain:  Vitals:   03/18/21 1105  TempSrc:   PainSc: 0-No pain                 Lidia Collum

## 2021-03-18 NOTE — Transfer of Care (Signed)
Immediate Anesthesia Transfer of Care Note  Patient: Austin Gray.  Procedure(s) Performed: MULTIPLE EXTRACTION WITH ALVEOLOPLASTY (Mouth)  Patient Location: PACU  Anesthesia Type:General  Level of Consciousness: awake, alert  and patient cooperative  Airway & Oxygen Therapy: Patient Spontanous Breathing and Patient connected to face mask oxygen  Post-op Assessment: Report given to RN, Post -op Vital signs reviewed and stable and Patient moving all extremities X 4  Post vital signs: Reviewed and stable  Last Vitals:  Vitals Value Taken Time  BP    Temp    Pulse    Resp    SpO2      Last Pain:  Vitals:   03/18/21 0740  TempSrc:   PainSc: 0-No pain         Complications: No notable events documented.

## 2021-03-18 NOTE — Anesthesia Procedure Notes (Signed)
Procedure Name: Intubation Date/Time: 03/18/2021 9:22 AM Performed by: Annamary Carolin, CRNA Pre-anesthesia Checklist: Patient identified, Emergency Drugs available, Suction available and Patient being monitored Patient Re-evaluated:Patient Re-evaluated prior to induction Oxygen Delivery Method: Circle System Utilized Preoxygenation: Pre-oxygenation with 100% oxygen Induction Type: IV induction Ventilation: Mask ventilation without difficulty Laryngoscope Size: Glidescope and 3 Nasal Tubes: Right Tube size: 7.5 mm Number of attempts: 1 Airway Equipment and Method: Stylet and Oral airway Placement Confirmation: ETT inserted through vocal cords under direct vision, positive ETCO2 and breath sounds checked- equal and bilateral Tube secured with: Tape Dental Injury: Teeth and Oropharynx as per pre-operative assessment  Comments: By Missy Sabins

## 2021-03-18 NOTE — Progress Notes (Signed)
No orders received in regards to pt's pacemaker for day of surgery. MD Witman notified, no new orders needed at this time.

## 2021-03-18 NOTE — Discharge Instructions (Signed)
[facility] Medicine Kailyn Dubie B. Threasa Kinch, D.M.D. Phone: [phone] Fax: [phone]    MOUTH CARE AFTER SURGERY   FACTS: Ice used in ice bag helps keep the swelling down, and can help lessen the pain. It is easier to treat pain BEFORE it happens. Spitting disturbs the clot and may cause bleeding to start again, or to get worse. Smoking delays healing and can cause complications. Sharing prescriptions can be dangerous.  Do not take medications not recently prescribed for you. Antibiotics may stop birth control pills from working.  Use other means of birth control while on antibiotics. Warm salt water rinses after the first 24 hours will help lessen the swelling:  Use 1/2 teaspoonful of table salt per oz.of water.  DO NOT: Do not spit.   Do not drink through a straw. Strongly advised not to smoke, dip snuff or chew tobacco at least for 3 days. Do not eat sharp or crunchy foods.  Avoid the area of surgery when chewing. Do not stop your antibiotics before your instructions say to do so. Do not eat hot foods until bleeding has stopped.  If you need to, let your food cool down to room temperature.  EXPECT: Some swelling, especially first 2-3 days. Soreness or discomfort in varying degrees.  Follow your dentist's instructions about how to handle pain before it starts. Pinkish saliva or light blood in saliva, or on your pillow in the morning.  This can last around 24 hours. Bruising inside or outside the mouth.  This may not show up until 2-3 days after surgery.  Don't worry, it will go away in time. Pieces of "bone" may work themselves loose.  It's OK.  If they bother you, let us know.    WHAT TO DO IMMEDIATELY AFTER SURGERY: Bite on gauze with steady pressure for 30-45 minutes at a time.  Switch out the gauze after 30-45 minutes for clean gauze, and continue this for 1-2 hours or until bleeding subsides. Do not chew on the gauze. Do not lie down flat.  Raise  your head support especially for the first 24 hours. Apply ice to your face on the side of the surgery.  You may apply it 20 minutes on and a few minutes off.  Ice for 8-12 hours.  You may use ice up to 24 hours. Before the numbness wears off, take a pain pill as instructed. Prescription pain medication is not always required.  SWELLING: Expect swelling for the first couple of days.  It should get better after that. If swelling increases 3 days or so after surgery, let us know as soon as possible.  FEVER: Take Tylenol every 4 hours if needed to lower your temperature, especially if it is at 100F or higher. Drink lots of fluids. If the fever does not go away, let us know.  BREATHING TROUBLE: Any unusual difficulty breathing means you have to have someone bring you to the emergency room ASAP.  BLEEDING: Light oozing is expected for 24 hours or so. Prop head up with pillows. Do not spit. Do not confuse bright red fresh flowing blood with lots of saliva colored with a little bit of blood. If you notice some bleeding, place gauze or a tea bag where it is bleeding and apply CONSTANT pressure by biting down for 1 hour.  Avoid talking during this time.  Do not remove the gauze or tea bag during this hour to "check" the bleeding. If you notice bright RED bleeding FLOWING out   of particular area, and filling the floor of your mouth, put a wad of gauze on that area, bite down firmly and constantly.  Call us immediately.  If we're closed, have someone bring you to the emergency room.  ORAL HYGIENE: Brush your teeth as usual after meals and before bedtime. Use a soft toothbrush around the area of surgery. DO NOT AVOID BRUSHING.  Otherwise bacteria(germs) will grow and may delay healing or encourage infection. Since you cannot spit, just gently rinse and let the water flow out of your mouth. DO NOT SWISH HARD.  EATING: Cool liquids are a good point to start.  Increase to soft foods as  tolerated.   PRESCRIPTIONS: Follow the directions for your prescriptions exactly as written. If your doctor gave you a narcotic pain medication, do not drive, operate machinery or drink alcohol when on that medication.   QUESTIONS? Call our office during office hours [phone] or call the Emergency Room at [phone].  

## 2021-03-18 NOTE — Op Note (Signed)
Department of Dental Medicine    OPERATIVE REPORT  DATE OF SURGERY:   03/18/2021  PATIENT'S NAME:   Austin Gray. DATE OF BIRTH:   January 21, 1932 MEDICAL RECORD NUMBER: [id number]  SURGEON:   Areal Cochrane B. Benson Norway, D.M.D.  ASSISTANT:  Molli Posey, DAII  PREOPERATIVE DIAGNOSES:  Dental caries, periodontal disease  Patient Active Problem List   Diagnosis Date Noted   Chronic atrial fibrillation ([place]) 06/29/2020   Permanent atrial fibrillation ([place]) 10/27/2019   Cardiac pacemaker in situ 10/27/2019   Educated about COVID-19 virus infection 10/27/2019   Heart block AV complete ([place])    PAF (paroxysmal atrial fibrillation) ([place]) 09/28/2018   Dyslipidemia 09/28/2018   Chronic diastolic heart failure ([place]) 11/23/2017   AKI (acute kidney injury) ([place]) 11/23/2017   Morbid obesity ([place]) 11/23/2017   Leg swelling 11/23/2017   Urinary tract infection 09/07/2017   Hyponatremia 09/07/2017   Dyspnea 09/07/2017   Bradycardia 09/05/2017   Non-rheumatic mitral regurgitation 02/22/2017   Atrial fibrillation, persistent ([place]) [id number]   Systolic murmur [id number]   Lower GI bleeding 02/15/2012   Hypotension 02/15/2012   History of BPH    Diverticulosis    Colon polyps    Visual impairment    Hypertension    Acute blood loss anemia    POSTOPERATIVE DIAGNOSES:  Dental caries, periodontal disease  PROCEDURES PERFORMED: Extractions of teeth numbers 4, 5, 7, 8, 9, 10, 12, 13, 14, 29 and 30 2 quadrants of alveoloplasty (URQ and ULQ)  ANESTHESIA:  General anesthesia via nasal endotracheal tube.  MEDICATIONS: Ancef 2 g IV prior to invasive dental procedures. Local anesthesia with a total utilization of 3 cartridges of 34 mg of lidocaine with 0.018 mg of epinephrine/ea.  SPECIMENS:  11 teeth that were extracted and discarded  DRAINS/CULTURES:  None  COMPLICATIONS:  None  ESTIMATED BLOOD LOSS:  5 mL  INTRAVENOUS FLUIDS:  10 mL of Lactated ringers solution  INDICATIONS:   The patient was recently diagnosed with severe mitral regurgitation/insufficiency.  A medically necessary dental consult was then requested to evaluate the patient for any dental/orofacial infection and their overall oral health.  The patient was examined and subsequently treatment planned for multiple extractions of grossly decayed and chronically infected teeth.  This treatment plan was made to decrease the perioperative and postoperative risks and complications associated with dental/orofacial infection from affecting the patient's systemic health.  OPERATIVE FINDINGS:  The patient was examined in operating room number 8.  The indicated teeth were identified and verified for extraction. The patient was noted be affected by severe dental decay and periodontal disease.  DESCRIPTION OF PROCEDURE:  The patient was identified in the holding area and brought to the main operating room number 8 by the anesthesia team. The patient was then placed in the supine position on the operating table.  General anesthesia was then induced per the anesthesia team. The patient was then prepped and draped in the usual sterile fashion for dental medicine procedures.  A timeout was performed. The patient was identified and procedures were verified. A throat pack was placed at this time. The oral cavity was then thoroughly examined with the findings noted above. The patient was then ready for the dental medicine procedure as follows:   ANESTHESIA: Local anesthesia was administered sequentially with a total utilization of 3 cartridges each containing 34 mg of lidocaine with 0.018 mg of epinephrine/ea.  Location of anesthesia included #4 and #5 infiltration and palatal, nasopalatine block, upper left quadrant infiltration and palatal, #  29 and #30 infiltration.  ROUTINE EXTRACTIONS: The maxillary left and right quadrants were first approached. The teeth were then subluxated with a series of straight elevators.  Teeth numbers 4,  5, 7, 8, 9, 10, 12, 13 and 14 were then removed with a 150 forceps without complications.  Alveoloplasty was then performed utilizing a ronguers and bone file.  The tissues were approximated and trimmed appropriately to help achieve primary closure.  The surgical sites were then irrigated with copious amounts of sterile saline. The surgical sites were closed using 3-0 chromic gut sutures as follows: 1 simple interrupted and 3 figure-8 style.  The mandibular left and right quadrants were then approached. The teeth were subluxated with a series of straight elevators.  Teeth numbers 29 and 30 were then removed utilizing a rongeurs without complications. The tissues were approximated and trimmed appropriately to help achieve primary closure. The surgical sites were then irrigated with copious amounts of sterile saline.   END OF PROCEDURE: Thorough oral irrigation with sterile saline was performed.  Good hemostasis was observed.  The patient was examined for complications, and seeing none, the dental medicine procedure was deemed to be complete.  The throat pack was removed at this time. An oral airway was then placed at the request of the anesthesia team.  A series of 4x4 gauze were placed in the mouth to aid hemostasis as needed.  The patient was then handed over to the anesthesia team for final disposition.  After an appropriate amount of time, the patient was extubated and taken to the postanesthsia care unit in stable condition.  All counts were correct for the dental medicine procedure.     [place] Benson Norway, D.M.D.

## 2021-03-19 ENCOUNTER — Encounter (HOSPITAL_COMMUNITY): Payer: Self-pay | Admitting: Dentistry

## 2021-03-19 ENCOUNTER — Other Ambulatory Visit (HOSPITAL_COMMUNITY): Payer: Self-pay | Admitting: *Deleted

## 2021-03-19 ENCOUNTER — Telehealth: Payer: Self-pay | Admitting: Dentistry

## 2021-03-19 NOTE — Pre-Procedure Instructions (Signed)
Surgical Instructions   Your procedure is scheduled on Thursday, September 8th. Report to [facility] Main Entrance "A" at 05:30 A.M., then check in with the Admitting office. Call this number if you have problems the morning of surgery: [phone]   If you have any questions prior to your surgery date call [phone]: Open Monday-Friday 8am-4pm   Remember: Do not eat after midnight the night before your surgery  You may drink clear liquids until 04:30 AM the morning of your surgery.   Clear liquids allowed are: Water, Non-Citrus Juices (without pulp), Carbonated Beverages, Clear Tea, Black Coffee ONLY (NO MILK, CREAM OR POWDERED CREAMER of any kind), and Gatorade   Take these medicines the morning of surgery with A SIP OF WATER  carvedilol (COREG)  If needed: acetaminophen (TYLENOL)  albuterol (VENTOLIN HFA)- bring inhaler with you on day of surgery fluticasone (FLONASE) guaiFENesin-dextromethorphan (ROBITUSSIN DM) polyvinyl alcohol (LIQUIFILM TEARS)  Hold Xarelto after 9/5 dose - stop 9/6. Hold furosemide and potassium the morning of procedure. Take other medications as directed with a sip of water.  As of today, STOP taking any Aspirin (unless otherwise instructed by your surgeon) Aleve, Naproxen, Ibuprofen, Motrin, Advil, Goody's, BC's, all herbal medications, fish oil, and all vitamins.          Do not wear jewelry or makeup Do not wear lotions, powders, perfumes/colognes, or deodorant. Do not shave 48 hours prior to surgery.  Men may shave face and neck. Do not bring valuables to the hospital. [facility] is not responsible for any belongings or valuables.                Do NOT Smoke (Tobacco/Vaping)  24 hours prior to your procedure If you use a CPAP at night, you may bring all equipment for your overnight stay.   Contacts, glasses, dentures or bridgework may not be worn into surgery, please bring cases for these belongings   For patients admitted to the hospital,  discharge time will be determined by your treatment team.   Patients discharged the day of surgery will not be allowed to drive home, and someone needs to stay with them for 24 hours.  ONLY 1 SUPPORT PERSON MAY BE PRESENT WHILE YOU ARE IN SURGERY. IF YOU ARE TO BE ADMITTED ONCE YOU ARE IN YOUR ROOM YOU WILL BE ALLOWED TWO (2) VISITORS.  Minor children may have two parents present. Special consideration for safety and communication needs will be reviewed on a case by case basis.  Special instructions:    Oral Hygiene is also important to reduce your risk of infection.  Remember - BRUSH YOUR TEETH THE MORNING OF SURGERY WITH YOUR REGULAR TOOTHPASTE   Austin Gray- Preparing For Surgery  Before surgery, you can play an important role. Because skin is not sterile, your skin needs to be as free of germs as possible. You can reduce the number of germs on your skin by washing with CHG (chlorahexidine gluconate) Soap before surgery.  CHG is an antiseptic cleaner which kills germs and bonds with the skin to continue killing germs even after washing.     Please do not use if you have an allergy to CHG or antibacterial soaps. If your skin becomes reddened/irritated stop using the CHG.  Do not shave (including legs and underarms) for at least 48 hours prior to first CHG shower. It is OK to shave your face.  Please follow these instructions carefully.     Shower the [employer] SURGERY and the  MORNING OF SURGERY with CHG Soap.   If you chose to wash your hair, wash your hair first as usual with your normal shampoo. After you shampoo, rinse your hair and body thoroughly to remove the shampoo.  Then [employer] and genitals (private parts) with your normal soap and rinse thoroughly to remove soap.  After that Use CHG Soap as you would any other liquid soap. You can apply CHG directly to the skin and wash gently with a scrungie or a clean washcloth.   Apply the CHG Soap to your body ONLY FROM THE NECK DOWN.   Do not use on open wounds or open sores. Avoid contact with your eyes, ears, mouth and genitals (private parts). Wash Face and genitals (private parts)  with your normal soap.   Wash thoroughly, paying special attention to the area where your surgery will be performed.  Thoroughly rinse your body with warm water from the neck down.  DO NOT shower/wash with your normal soap after using and rinsing off the CHG Soap.  Pat yourself dry with a CLEAN TOWEL.  Wear CLEAN PAJAMAS to bed the night before surgery  Place CLEAN SHEETS on your bed the night before your surgery  DO NOT SLEEP WITH PETS.   Day of Surgery:  Take a shower with CHG soap. Wear Clean/Comfortable clothing the morning of surgery Do not apply any deodorants/lotions.   Remember to brush your teeth WITH YOUR REGULAR TOOTHPASTE.   Please read over the following fact sheets that you were given.

## 2021-03-19 NOTE — Telephone Encounter (Signed)
03/25/21   @   1500  I contacted the patient's daughter to see how Mr. Wittbrodt was doing after his dental surgery early today.  She said that he was doing well with only a little bleeding and he had just eaten some ice cream.  I answered all questions regarding postop instructions and reviewed prescriptions with the patient's daughter that I had just called in to the pharmacy including Peridex mouth rinse and Amoxicillin.  Explained the extent of periodontal disease and chronic infection of several teeth which was why we did have to end up extracting more teeth than expected.  She verbalized understanding and was appreciative of the phone call.  [place] Benson Norway, D.M.D.

## 2021-03-23 ENCOUNTER — Encounter (HOSPITAL_COMMUNITY): Payer: Self-pay

## 2021-03-23 ENCOUNTER — Other Ambulatory Visit: Payer: Self-pay

## 2021-03-23 ENCOUNTER — Ambulatory Visit: Payer: Medicare HMO | Attending: Cardiovascular Disease | Admitting: Physical Therapy

## 2021-03-23 ENCOUNTER — Other Ambulatory Visit (HOSPITAL_COMMUNITY): Payer: Medicare HMO

## 2021-03-23 ENCOUNTER — Encounter: Payer: Self-pay | Admitting: Physical Therapy

## 2021-03-23 ENCOUNTER — Encounter: Payer: Self-pay | Admitting: Cardiology

## 2021-03-23 ENCOUNTER — Ambulatory Visit (HOSPITAL_COMMUNITY)
Admission: RE | Admit: 2021-03-23 | Discharge: 2021-03-23 | Disposition: A | Discharge status: Home / Self Care | Payer: Medicare HMO | Source: Ambulatory Visit | Attending: Cardiovascular Disease | Admitting: Cardiovascular Disease

## 2021-03-23 ENCOUNTER — Encounter (HOSPITAL_COMMUNITY)
Admission: RE | Admit: 2021-03-23 | Discharge: 2021-03-23 | Disposition: A | Discharge status: Home / Self Care | Payer: Medicare HMO | Source: Ambulatory Visit | Attending: Cardiovascular Disease | Admitting: Cardiovascular Disease

## 2021-03-23 DIAGNOSIS — Z006 Encounter for examination for normal comparison and control in clinical research program: Secondary | ICD-10-CM | POA: Diagnosis not present

## 2021-03-23 DIAGNOSIS — R293 Abnormal posture: Secondary | ICD-10-CM | POA: Insufficient documentation

## 2021-03-23 DIAGNOSIS — Z20822 Contact with and (suspected) exposure to covid-19: Secondary | ICD-10-CM | POA: Insufficient documentation

## 2021-03-23 DIAGNOSIS — I340 Nonrheumatic mitral (valve) insufficiency: Secondary | ICD-10-CM | POA: Insufficient documentation

## 2021-03-23 DIAGNOSIS — Z01818 Encounter for other preprocedural examination: Secondary | ICD-10-CM

## 2021-03-23 DIAGNOSIS — R2689 Other abnormalities of gait and mobility: Secondary | ICD-10-CM | POA: Insufficient documentation

## 2021-03-23 DIAGNOSIS — I482 Chronic atrial fibrillation, unspecified: Secondary | ICD-10-CM | POA: Diagnosis not present

## 2021-03-23 LAB — COMPREHENSIVE METABOLIC PANEL
ALT: 25 U/L (ref 0–44)
AST: 40 U/L (ref 15–41)
Albumin: 3.9 g/dL (ref 3.5–5.0)
Alkaline Phosphatase: 65 U/L (ref 38–126)
Anion gap: 8 (ref 5–15)
BUN: 17 mg/dL (ref 8–23)
CO2: 26 mmol/L (ref 22–32)
Calcium: 9 mg/dL (ref 8.9–10.3)
Chloride: 103 mmol/L (ref 98–111)
Creatinine, Ser: 0.91 mg/dL (ref 0.61–1.24)
GFR, Estimated: >60 mL/min (ref >60)
Glucose, Bld: 122 mg/dL — ABNORMAL HIGH (ref 70–99)
Potassium: 3.9 mmol/L (ref 3.5–5.1)
Sodium: 137 mmol/L (ref 135–145)
Total Bilirubin: 1.8 mg/dL — ABNORMAL HIGH (ref 0.3–1.2)
Total Protein: 6.5 g/dL (ref 6.5–8.1)

## 2021-03-23 LAB — BLOOD GAS, ARTERIAL
Acid-Base Excess: 3.2 mmol/L — ABNORMAL HIGH (ref 0.0–2.0)
Bicarbonate: 27.2 mmol/L (ref 20.0–28.0)
Drawn by: 58793
FIO2: 21
O2 Saturation: 96.3 %
Patient temperature: 37
pCO2 arterial: 41.2 mmHg (ref 32.0–48.0)
pH, Arterial: 7.435 (ref 7.350–7.450)
pO2, Arterial: 80.3 mmHg — ABNORMAL LOW (ref 83.0–108.0)

## 2021-03-23 LAB — URINALYSIS, ROUTINE W REFLEX MICROSCOPIC
Bilirubin Urine: NEGATIVE
Glucose, UA: NEGATIVE mg/dL
Hgb urine dipstick: NEGATIVE
Ketones, ur: NEGATIVE mg/dL
Leukocytes,Ua: NEGATIVE
Nitrite: NEGATIVE
Protein, ur: NEGATIVE mg/dL
Specific Gravity, Urine: 1.008 (ref 1.005–1.030)
pH: 7 (ref 5.0–8.0)

## 2021-03-23 LAB — TYPE AND SCREEN
ABO/RH(D): O POS
Antibody Screen: NEGATIVE

## 2021-03-23 LAB — SURGICAL PCR SCREEN
MRSA, PCR: NEGATIVE
Staphylococcus aureus: NEGATIVE

## 2021-03-23 LAB — CBC
HCT: 37.6 % — ABNORMAL LOW (ref 39.0–52.0)
Hemoglobin: 12.2 g/dL — ABNORMAL LOW (ref 13.0–17.0)
MCH: 28.6 pg (ref 26.0–34.0)
MCHC: 32.4 g/dL (ref 30.0–36.0)
MCV: 88.3 fL (ref 80.0–100.0)
Platelets: 119 10*3/uL — ABNORMAL LOW (ref 150–400)
RBC: 4.26 MIL/uL (ref 4.22–5.81)
RDW: 15.5 % (ref 11.5–15.5)
WBC: 8.4 10*3/uL (ref 4.0–10.5)
nRBC: 0 % (ref 0.0–0.2)

## 2021-03-23 LAB — PROTIME-INR
INR: 1.3 — ABNORMAL HIGH (ref 0.8–1.2)
Prothrombin Time: 15.7 seconds — ABNORMAL HIGH (ref 11.4–15.2)

## 2021-03-23 LAB — SARS CORONAVIRUS 2 (TAT 6-24 HRS): SARS Coronavirus 2: NEGATIVE

## 2021-03-23 NOTE — Progress Notes (Signed)
PCP - Cletus Gash T. Pickard MD Cardiologist - Minus Breeding  PPM/ICD - [employer] PPM Device Orders - no  Rep Notified - Message left for Windle Guard to call.   Chest x-ray -  EKG - 03/23/21 Stress Test - none ECHO - 12/23/20 Cardiac Cath - 02/24/21  Sleep Study - no CPAP - no  Fasting Blood Sugar - n/a  Checks Blood Sugar _____ times a day  Blood Thinner Instructions: Hold Xarelto after 9/5 dose - stop 9/6. Aspirin Instructions:n/a  ERAS Protcol -no PRE-SURGERY Ensure or G2-   COVID TEST- 03/23/21   Anesthesia review: yes  Patient denies shortness of breath, fever, cough and chest pain at PAT appointment   All instructions explained to the patient, with a verbal understanding of the material. Patient agrees to go over the instructions while at home for a better understanding. Patient also instructed to self quarantine after being tested for COVID-19. The opportunity to ask questions was provided.

## 2021-03-23 NOTE — Progress Notes (Signed)
Emailed [facility] for perioperative cardiac device programming orders. Called [employer]. Jude rep. He was not available. Left message for him to call in regards to Austin Gray who is having surgery on 03/25/21.No call received as of this time.

## 2021-03-23 NOTE — Therapy (Signed)
[place], [place], [address] Phone: [phone]   Fax:  [phone]  Physical Therapy Evaluation  Patient Details  Name: Markiese Maile. MRN: [id number] Date of Birth: Feb 11, 1932 Referring Provider (PT): Sherren Mocha, MD   Encounter Date: 03/23/2021   PT End of Session - 03/23/21 1500     Visit Number 1    Number of Visits 1    Date for PT Re-Evaluation 03/23/21    PT Start Time 1500    PT Stop Time [id number]    PT Time Calculation (min) 35 min    Equipment Utilized During Treatment Gait belt    Activity Tolerance Patient tolerated treatment well    Behavior During Therapy WFL for tasks assessed/performed             Past Medical History:  Diagnosis Date   Anxiety    Arthritis    "little bit; left knee"   Atrial fibrillation ([place])    Chronic   Basal cell carcinoma    Cataracts, bilateral    to have left eye cataract surgery on 03/21/17    CHF (congestive heart failure), NYHA class II, chronic, diastolic (HCC)    Colon polyps    last colonoscopy 2015 (tubular adenoma)   Depression    Diverticulosis    Gastritis    GERD (gastroesophageal reflux disease)    H/O: GI bleed    Recurrent   Hemorrhoids    History of BPH    History of kidney stones    hx of x 1  passed stones   Hypertension    Pernicious anemia    Presence of permanent cardiac pacemaker    PVC (premature ventricular contraction)    Rectal bleeding 02/15/2012   Vitamin D deficiency     Past Surgical History:  Procedure Laterality Date   COLONOSCOPY WITH PROPOFOL N/A 03/17/2017   Procedure: COLONOSCOPY WITH PROPOFOL;  Surgeon: Carol Ada, MD;  Location: [facility];  Service: Endoscopy;  Laterality: N/A;   [place]; ~ 2002   left; right   MULTIPLE EXTRACTIONS WITH ALVEOLOPLASTY N/A 03/18/2021   Procedure: MULTIPLE EXTRACTION WITH ALVEOLOPLASTY;  Surgeon: Charlaine Dalton, DMD;   Location: [facility];  Service: Dentistry;  Laterality: N/A;   PACEMAKER IMPLANT N/A 03/11/2019   Procedure: PACEMAKER IMPLANT;  Surgeon: Constance Haw, MD;  Location: [place] [facility];  Service: Cardiovascular;  Laterality: N/A;   RIGHT/LEFT HEART CATH AND CORONARY ANGIOGRAPHY N/A 02/24/2021   Procedure: RIGHT/LEFT HEART CATH AND CORONARY ANGIOGRAPHY;  Surgeon: Sherren Mocha, MD;  Location: [place] [facility];  Service: Cardiovascular;  Laterality: N/A;   TEE WITHOUT CARDIOVERSION N/A 12/23/2020   Procedure: TRANSESOPHAGEAL ECHOCARDIOGRAM (TEE);  Surgeon: Freada Bergeron, MD;  Location: [facility];  Service: Cardiovascular;  Laterality: N/A;   TEMPORARY PACEMAKER N/A 03/10/2019   Procedure: TEMPORARY PACEMAKER;  Surgeon: Martinique, Peter M, MD;  Location: [place] [facility];  Service: Cardiovascular;  Laterality: N/A;    There were no vitals filed for this visit.    Subjective Assessment - 03/23/21 1508     Subjective pt is a 87 y.o M with CC of SOB that has been going on for a while per the  patients daughter, no chest pain and general fatigue. pt denies any other issues today.    Patient Stated Goals to fix heart    Currently in Pain? No/denies  [facility] PT Assessment - 03/23/21 0001       Assessment   Medical Diagnosis Severe Mitral insufficency    Referring Provider (PT) Sherren Mocha, MD    Hand Dominance Right      Precautions   Precautions None      Restrictions   Weight Bearing Restrictions No      Balance Screen   Has the patient fallen in the past 6 months No      [place] residence    Living Arrangements Alone;Other (Comment)   relatives nearby   Type of [place] to enter    Entrance Stairs-Number of Steps 4    Entrance Stairs-Rails Can reach both    Home Layout One level    [place] - 2 wheels   rollator, walking stick     Prior Function   Level  of Independence Independent with basic ADLs      ROM / Strength   AROM / PROM / Strength AROM;Strength      AROM   Overall AROM  Within functional limits for tasks performed      Strength   Overall Strength Within functional limits for tasks performed    Strength Assessment Site Hand    Right Hand Grip (lbs) 75    Left Hand Grip (lbs) 81      Ambulation/Gait   Assistive device --   walking stick   Gait Pattern Step-through pattern;Decreased stride length;Antalgic;Decreased trunk rotation;Trunk flexed    Gait Comments pt ambulated 185 in 1:40 sec requiring 2:20 rest break with 98% O2 and HR 89, He resumed walking and additional 185 in 2 min finishing testing with 97% O2 and HR 90              [facility] Pre-Surgical Assessment - 03/23/21 0001     5 Meter Walk Test- trial 1 6 sec    5 Meter Walk Test- trial 2 7 sec.     5 Meter Walk Test- trial 3 7 sec.    5 meter walk test average 6.67 sec    4 Stage Balance Test tolerated for:  10 sec.    4 Stage Balance Test Position 2    Sit To Stand Test- trial 1 16 sec.    ADL/IADL Independent with: Bathing;Dressing;Meal prep    ADL/IADL Needs Assistance with: Finances;Yard work    ADL/IADL Therapist, sports Index Midly frail    6 Minute Walk- Baseline yes    BP (mmHg) 127/70    HR (bpm) 70    02 Sat (%RA) 96 %    Modified Borg Scale for Dyspnea 0- Nothing at all    Perceived Rate of Exertion (Borg) 6-    6 Minute Walk Post Test yes   performed with walking stick   BP (mmHg) 139/76    HR (bpm) 90    02 Sat (%RA) 97 %    Modified Borg Scale for Dyspnea 7- Severe shortness of breath or very hard breathing    Perceived Rate of Exertion (Borg) 15- Hard    Aerobic Endurance Distance Walked 370    Endurance additional comments pt is 72.95% limited compared to age related norm                      Objective measurements completed on examination: See above findings.  Plan - 03/23/21  1500     Clinical Impression Statement See assessment in note    Stability/Clinical Decision Making Stable/Uncomplicated    Clinical Decision Making Low            Clinical Impression Statement: Pt is a 87 yo M presenting to OP PT for evaluation prior to possible Mitraclip surgery due to severe mitral regurgitation. Pt reports onset of SOB but was unspecific to a time frame stating he had it for years. Symptoms are limiting prlonged standing/ 2aking. Pt presents with good ROM and strength, fair balance and is assessed ad moderate at high fall risk 4 stage balance test, fair walking speed and limited aerobic endurance per 6 minute walk test. pt ambulated 185 in 1:40 sec requiring 2:20 rest break with 98% O2 and HR 89, He resumed walking and additional 185 in 2 min finishing testing with 97% O2 and HR 90 on room air. Pt reported 7/10 shortness of breath on modified scale for dyspnea. Pt ambulated a total of 370 feet in 6 minute walk. SOB, and fatigue increased significantly with 6 minute walk test. Based on the Short Physical Performance Battery, patient has a frailty rating of 6/12 with </= 5/12 considered frail.     Patient demonstrated the following deficits and impairments:     Visit Diagnosis: Other abnormalities of gait and mobility  Abnormal posture     Problem List Patient Active Problem List   Diagnosis Date Noted   [place]    Periodontal disease    Chronic atrial fibrillation ([place]) 06/29/2020   Permanent atrial fibrillation ([place]) 10/27/2019   Cardiac pacemaker in situ 10/27/2019   Educated about COVID-19 virus infection 10/27/2019   Heart block AV complete ([place])    PAF (paroxysmal atrial fibrillation) ([place]) 09/28/2018   Dyslipidemia 09/28/2018   Chronic diastolic heart failure ([employer]) 11/23/2017   AKI (acute kidney injury) ([place]) 11/23/2017   Morbid obesity ([place]) 11/23/2017   Leg swelling 11/23/2017   Urinary tract infection 09/07/2017   Hyponatremia 09/07/2017    Dyspnea 09/07/2017   Bradycardia 09/05/2017   Non-rheumatic mitral regurgitation 02/22/2017   Atrial fibrillation, persistent ([place]) [id number]   Systolic murmur [id number]   Lower GI bleeding 02/15/2012   Hypotension 02/15/2012   History of BPH    Diverticulosis    Colon polyps    Visual impairment    Hypertension    Acute blood loss anemia     [place] 03/23/2021, 3:40 PM  [facility] [address] [place], [place], [address] Phone: [phone]   Fax:  [phone]  Name: Jeriko Soltero. MRN: [id number] Date of Birth: Oct 22, 1931

## 2021-03-23 NOTE — Progress Notes (Signed)
PERIOPERATIVE PRESCRIPTION FOR IMPLANTED CARDIAC DEVICE PROGRAMMING  Patient Information: Name:  Austin Gray.  DOB:  May 17, 1932  MRN:  [id number]    Planned Procedure:  Mitral valve repair  Surgeon:  Sherren Mocha MD  Date of Procedure:  03/25/21  Cautery will be used.  Position during surgery:  supine   Please send documentation back to:  [facility] (Fax # [phone])  Device Information:  Clinic EP Physician:  Allegra Lai, MD   Device Type:  Pacemaker Manufacturer and Phone #:  [employer]: [phone] Pacemaker Dependent?:  Yes.   Date of Last Device Check:  12/23/20 (Remote) Normal Device Function?:  Yes.    Electrophysiologist's Recommendations:  Have magnet available. Provide continuous ECG monitoring when magnet is used or reprogramming is to be performed.  Procedure will likely interfere with device function.  Device should be programmed:  Asynchronous pacing during procedure and returned to normal programming after procedure  Per [facility] Standing Orders, Simone Curia, RN  5:18 PM 03/23/2021

## 2021-03-23 NOTE — Progress Notes (Addendum)
Surgical Instructions   Your procedure is scheduled on Thursday, September 8th. Report to [facility] Main Entrance "A" at 05:30 A.M., then check in with the Admitting office. Call this number if you have problems the morning of surgery: [phone]   If you have any questions prior to your surgery date call [phone]: Open Monday-Friday 8am-4pm   Remember: Do not eat or drink anything after midnight the night before your surgery.   Take these medicines the morning of surgery with A SIP OF WATER  carvedilol (COREG) amLODipine (NORVASC)    If needed: acetaminophen (TYLENOL)  albuterol (VENTOLIN HFA)- bring inhaler with you on day of surgery fluticasone (FLONASE) guaiFENesin-dextromethorphan (ROBITUSSIN DM) polyvinyl alcohol (LIQUIFILM TEARS)  Hold Xarelto after 9/5 dose - stop 9/6. Hold furosemide and potassium the morning of procedure. Take other medications as directed with a sip of water.  As of today, STOP taking any Aspirin (unless otherwise instructed by your surgeon) Aleve, Naproxen, Ibuprofen, Motrin, Advil, Goody's, BC's, all herbal medications, fish oil, and all vitamins.          Do not wear jewelry or makeup Do not wear lotions, powders, perfumes/colognes, or deodorant. Do not shave 48 hours prior to surgery.  Men may shave face and neck. Do not bring valuables to the hospital. [facility] is not responsible for any belongings or valuables.                Do NOT Smoke (Tobacco/Vaping)  24 hours prior to your procedure If you use a CPAP at night, you may bring all equipment for your overnight stay.   Contacts, glasses, dentures or bridgework may not be worn into surgery, please bring cases for these belongings   For patients admitted to the hospital, discharge time will be determined by your treatment team.   Patients discharged the day of surgery will not be allowed to drive home, and someone needs to stay with them for 24 hours.  ONLY 1 SUPPORT PERSON MAY  BE PRESENT WHILE YOU ARE IN SURGERY. IF YOU ARE TO BE ADMITTED ONCE YOU ARE IN YOUR ROOM YOU WILL BE ALLOWED TWO (2) VISITORS.  Minor children may have two parents present. Special consideration for safety and communication needs will be reviewed on a case by case basis.  Special instructions:    Oral Hygiene is also important to reduce your risk of infection.  Remember - BRUSH YOUR TEETH THE MORNING OF SURGERY WITH YOUR REGULAR TOOTHPASTE   [place]- Preparing For Surgery  Before surgery, you can play an important role. Because skin is not sterile, your skin needs to be as free of germs as possible. You can reduce the number of germs on your skin by washing with CHG (chlorahexidine gluconate) Soap before surgery.  CHG is an antiseptic cleaner which kills germs and bonds with the skin to continue killing germs even after washing.     Please do not use if you have an allergy to CHG or antibacterial soaps. If your skin becomes reddened/irritated stop using the CHG.  Do not shave (including legs and underarms) for at least 48 hours prior to first CHG shower. It is OK to shave your face.  Please follow these instructions carefully.     Shower the NIGHT BEFORE SURGERY and the MORNING OF SURGERY with CHG Soap.   If you chose to wash your hair, wash your hair first as usual with your normal shampoo. After you shampoo, rinse your hair and body thoroughly to remove  the shampoo.  Then [employer] and genitals (private parts) with your normal soap and rinse thoroughly to remove soap.  After that Use CHG Soap as you would any other liquid soap. You can apply CHG directly to the skin and wash gently with a scrungie or a clean washcloth.   Apply the CHG Soap to your body ONLY FROM THE NECK DOWN.  Do not use on open wounds or open sores. Avoid contact with your eyes, ears, mouth and genitals (private parts). Wash Face and genitals (private parts)  with your normal soap.   Wash thoroughly, paying special  attention to the area where your surgery will be performed.  Thoroughly rinse your body with warm water from the neck down.  DO NOT shower/wash with your normal soap after using and rinsing off the CHG Soap.  Pat yourself dry with a CLEAN TOWEL.  Wear CLEAN PAJAMAS to bed the night before surgery  Place CLEAN SHEETS on your bed the night before your surgery  DO NOT SLEEP WITH PETS.   Day of Surgery:  Take a shower with CHG soap. Wear Clean/Comfortable clothing the morning of surgery Do not apply any deodorants/lotions.   Remember to brush your teeth WITH YOUR REGULAR TOOTHPASTE.   Please read over the following fact sheets that you were given.

## 2021-03-24 ENCOUNTER — Encounter (HOSPITAL_COMMUNITY): Payer: Self-pay | Admitting: Dentistry

## 2021-03-24 ENCOUNTER — Ambulatory Visit (INDEPENDENT_AMBULATORY_CARE_PROVIDER_SITE_OTHER): Payer: Medicare HMO | Admitting: Dentistry

## 2021-03-24 ENCOUNTER — Ambulatory Visit (INDEPENDENT_AMBULATORY_CARE_PROVIDER_SITE_OTHER): Payer: Medicare HMO

## 2021-03-24 DIAGNOSIS — K056 Periodontal disease, unspecified: Secondary | ICD-10-CM

## 2021-03-24 DIAGNOSIS — I442 Atrioventricular block, complete: Secondary | ICD-10-CM

## 2021-03-24 DIAGNOSIS — K08199 Complete loss of teeth due to other specified cause, unspecified class: Secondary | ICD-10-CM

## 2021-03-24 LAB — CUP PACEART REMOTE DEVICE CHECK
Battery Remaining Longevity: 93 mo
Battery Remaining Percentage: 86 %
Battery Voltage: 3.01 V
Brady Statistic RV Percent Paced: 95 %
Date Time Interrogation Session: 20220907020014
Implantable Lead Implant Date: 20200824
Implantable Lead Location: 753860
Implantable Pulse Generator Implant Date: 20200824
Lead Channel Impedance Value: 440 Ohm
Lead Channel Pacing Threshold Amplitude: 1 V
Lead Channel Pacing Threshold Pulse Width: 0.5 ms
Lead Channel Sensing Intrinsic Amplitude: 5.7 mV
Lead Channel Setting Pacing Amplitude: 2.5 V
Lead Channel Setting Pacing Pulse Width: 0.5 ms
Lead Channel Setting Sensing Sensitivity: 0.7 mV
Pulse Gen Model: 1272
Pulse Gen Serial Number: 9140133

## 2021-03-24 MED ORDER — TRANEXAMIC ACID (OHS) BOLUS VIA INFUSION: 15.0000 mg/kg | INTRAVENOUS | Status: DC

## 2021-03-24 MED ORDER — VANCOMYCIN HCL 1500 MG/300ML IV SOLN
1500.0000 mg | INTRAVENOUS | Status: DC
  Filled 2021-03-24 (×2): qty 300

## 2021-03-24 MED ORDER — MILRINONE LACTATE IN DEXTROSE 20-5 MG/100ML-% IV SOLN
0.3000 ug/kg/min | INTRAVENOUS | Status: DC
  Filled 2021-03-24 (×2): qty 100

## 2021-03-24 MED ORDER — TRANEXAMIC ACID (OHS) PUMP PRIME SOLUTION
2.0000 mg/kg | INTRAVENOUS | Status: DC
  Filled 2021-03-24: qty 1.9

## 2021-03-24 MED ORDER — NOREPINEPHRINE 4 MG/250ML-% IV SOLN
0.0000 ug/min | INTRAVENOUS | Status: DC
Start: 2021-03-25 — End: 2021-03-25
  Filled 2021-03-24: qty 250

## 2021-03-24 MED ORDER — CEFAZOLIN SODIUM-DEXTROSE 2-4 GM/100ML-% IV SOLN
2.0000 g | INTRAVENOUS | Status: AC
  Administered 2021-03-25: 2 g via INTRAVENOUS
  Filled 2021-03-24 (×3): qty 100

## 2021-03-24 MED ORDER — TRANEXAMIC ACID 1000 MG/10ML IV SOLN
1.5000 mg/kg/h | INTRAVENOUS | Status: DC
  Filled 2021-03-24: qty 25

## 2021-03-24 MED ORDER — PLASMA-LYTE A IV SOLN
INTRAVENOUS | Status: DC
  Filled 2021-03-24: qty 5

## 2021-03-24 MED ORDER — MAGNESIUM SULFATE 50 % IJ SOLN
40.0000 meq | INTRAMUSCULAR | Status: DC
  Filled 2021-03-24: qty 9.85

## 2021-03-24 MED ORDER — DEXMEDETOMIDINE HCL IN NACL 400 MCG/100ML IV SOLN
0.1000 ug/kg/h | INTRAVENOUS | Status: DC
  Filled 2021-03-24 (×2): qty 100

## 2021-03-24 MED ORDER — INSULIN REGULAR(HUMAN) IN NACL 100-0.9 UT/100ML-% IV SOLN
INTRAVENOUS | Status: DC
  Filled 2021-03-24 (×2): qty 100

## 2021-03-24 MED ORDER — NITROGLYCERIN IN D5W 200-5 MCG/ML-% IV SOLN
2.0000 ug/min | INTRAVENOUS | Status: DC
Start: 2021-03-25 — End: 2021-03-25
  Filled 2021-03-24: qty 250

## 2021-03-24 MED ORDER — SODIUM CHLORIDE 0.9 % IV SOLN
INTRAVENOUS | Status: DC
  Filled 2021-03-24: qty 30

## 2021-03-24 MED ORDER — CEFAZOLIN SODIUM-DEXTROSE 2-4 GM/100ML-% IV SOLN
2.0000 g | INTRAVENOUS | Status: DC
  Filled 2021-03-24 (×2): qty 100

## 2021-03-24 MED ORDER — POTASSIUM CHLORIDE 2 MEQ/ML IV SOLN
80.0000 meq | INTRAVENOUS | Status: DC
  Filled 2021-03-24: qty 40

## 2021-03-24 MED ORDER — PHENYLEPHRINE HCL-NACL 20-0.9 MG/250ML-% IV SOLN
30.0000 ug/min | INTRAVENOUS | Status: AC
  Administered 2021-03-25: 25 ug/min via INTRAVENOUS
  Filled 2021-03-24 (×2): qty 250

## 2021-03-24 MED ORDER — EPINEPHRINE HCL 5 MG/250ML IV SOLN IN NS
0.0000 ug/min | INTRAVENOUS | Status: DC
  Filled 2021-03-24 (×2): qty 250

## 2021-03-24 NOTE — Anesthesia Preprocedure Evaluation (Addendum)
Anesthesia Evaluation  Patient identified by MRN, date of birth, ID band Patient awake    Reviewed: Allergy & Precautions, NPO status , Patient's Chart, lab work & pertinent test results, reviewed documented beta blocker date and time   History of Anesthesia Complications Negative for: history of anesthetic complications  Airway Mallampati: III  TM Distance: >3 FB Neck ROM: Full    Dental  (+) Dental Advisory Given, Missing, Loose   Pulmonary neg pulmonary ROS,    Pulmonary exam normal        Cardiovascular hypertension, Pt. on medications +CHF  + dysrhythmias Atrial Fibrillation + pacemaker + Valvular Problems/Murmurs MR  Rhythm:Regular Rate:Normal + Systolic murmurs  '22 Cath - 1.  Mild diffuse coronary irregularity/plaquing with no significant obstructive disease 2.  Known severe mitral regurgitation with large V waves (28 mmHg) 3.  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 EROA 0.4cm2 with RVol 76m by PISA. EF 55 to 60%. RV systolic function is mildly reduced. The right ventricular size is moderately enlarged. RV pacing wire is present. RVSP is 315mg+RAP. LA size was severely dilated. RA was severely dilated. Tricuspid valve regurgitation is severe. Suspect functional due to annular dilation. Aortic valve regurgitation is trivial. Mild aortic valve sclerosis is present, with no evidence of aortic valve stenosis. There is Moderate (Grade III) plaque.     Neuro/Psych PSYCHIATRIC DISORDERS Anxiety Depression negative neurological ROS     GI/Hepatic Neg liver ROS, GERD  Controlled,  Endo/Other   Obesity   Renal/GU negative Renal ROS     Musculoskeletal  (+) Arthritis ,   Abdominal   Peds  Hematology  (+) anemia ,  Plt  119k On xarelto    Anesthesia Other Findings   Reproductive/Obstetrics                            Anesthesia Physical Anesthesia Plan  ASA: 4  Anesthesia Plan: General   Post-op Pain Management:    Induction: Intravenous  PONV Risk Score and Plan: 2 and Treatment may vary due to age or medical condition and Ondansetron  Airway Management Planned: Oral ETT  Additional Equipment: Arterial line and TEE  Intra-op Plan:   Post-operative Plan: Extubation in OR  Informed Consent: I have reviewed the patients History and Physical, chart, labs and discussed the procedure including the risks, benefits and alternatives for the proposed anesthesia with the patient or authorized representative who has indicated his/her understanding and acceptance.     Dental advisory given  Plan Discussed with: CRNA and Anesthesiologist  Anesthesia Plan Comments:        Anesthesia Quick Evaluation

## 2021-03-24 NOTE — Progress Notes (Signed)
Department of Dental Medicine      POSTOPERATIVE VISIT  Service Date:   03/24/2021  Patient Name:   Austin Gray. Date of Birth:   1932-01-02 Medical Record Number: [id number]        TODAY'S VISIT:   Assessment:   The patient continues to heal well and consistent with dental procedures performed.   Plan:  Follow-up as needed. Rx:  Finish oral antibiotic course and continue Peridex rinses for 1 more week. Recommendations: Establish care at an outside dental office for routine treatment including replacement of missing teeth as needed, cleanings/periodontal therapy and exams.  Discussed in detail all treatment options and recommendations with the patient and they are agreeable to the plan.       PROGRESS NOTE:   COVID-19 SCREENING:  The patient denies symptoms concerning for COVID-19 infection including fever, chills, cough, or newly developed shortness of breath.   HISTORY OF PRESENT ILLNESS Austin Gray. presents today for a postoperative visit s/p multiple extractions with alveoloplasty in the operating room on 03/18/21.   Medical and dental history reviewed with the patient.  No changes reported. He has heart surgery tomorrow (9/8) as planned.   CHIEF COMPLAINT:   Patient with no complaints. He reports that he has been doing well since his surgery and that his sutures have all fallen out.   Patient Active Problem List   Diagnosis Date Noted   [place]    Periodontal disease    Chronic atrial fibrillation ([place]) 06/29/2020   Permanent atrial fibrillation ([place]) 10/27/2019   Cardiac pacemaker in situ 10/27/2019   Educated about COVID-19 virus infection 10/27/2019   Heart block AV complete ([place])    PAF (paroxysmal atrial fibrillation) ([place]) 09/28/2018   Dyslipidemia 09/28/2018   Chronic diastolic heart failure ([place]) 11/23/2017   AKI (acute kidney injury) ([place]) 11/23/2017   Morbid obesity ([place]) 11/23/2017   Leg swelling 11/23/2017   Urinary tract  infection 09/07/2017   Hyponatremia 09/07/2017   Dyspnea 09/07/2017   Bradycardia 09/05/2017   Non-rheumatic mitral regurgitation 02/22/2017   Atrial fibrillation, persistent ([place]) [id number]   Systolic murmur [id number]   Lower GI bleeding 02/15/2012   Hypotension 02/15/2012   History of BPH    Diverticulosis    Colon polyps    Visual impairment    Hypertension    Acute blood loss anemia    Past Medical History:  Diagnosis Date   Anxiety    Arthritis    "little bit; left knee"   Atrial fibrillation ([place])    Chronic   Basal cell carcinoma    Cataracts, bilateral    to have left eye cataract surgery on 03/21/17    CHF (congestive heart failure), NYHA class II, chronic, diastolic ([facility])    Colon polyps    last colonoscopy 2015 (tubular adenoma)   Depression    Diverticulosis    Gastritis    GERD (gastroesophageal reflux disease)    H/O: GI bleed    Recurrent   Hemorrhoids    History of BPH    History of kidney stones    hx of x 1  passed stones   Hypertension    Pernicious anemia    Presence of permanent cardiac pacemaker    PVC (premature ventricular contraction)    Rectal bleeding 02/15/2012   Vitamin D deficiency    Past Surgical History:  Procedure Laterality Date   COLONOSCOPY WITH PROPOFOL N/A 03/17/2017   Procedure: COLONOSCOPY WITH PROPOFOL;  Surgeon: Carol Ada,  MD;  Location: [facility];  Service: Endoscopy;  Laterality: N/A;   [place]; ~ 2002   left; right   MULTIPLE EXTRACTIONS WITH ALVEOLOPLASTY N/A 03/18/2021   Procedure: MULTIPLE EXTRACTION WITH ALVEOLOPLASTY;  Surgeon: Charlaine Dalton, DMD;  Location: [facility];  Service: Dentistry;  Laterality: N/A;   PACEMAKER IMPLANT N/A 03/11/2019   Procedure: PACEMAKER IMPLANT;  Surgeon: Constance Haw, MD;  Location: [facility];  Service: Cardiovascular;  Laterality: N/A;   RIGHT/LEFT HEART CATH AND CORONARY ANGIOGRAPHY N/A 02/24/2021   Procedure:  RIGHT/LEFT HEART CATH AND CORONARY ANGIOGRAPHY;  Surgeon: Sherren Mocha, MD;  Location: [facility];  Service: Cardiovascular;  Laterality: N/A;   TEE WITHOUT CARDIOVERSION N/A 12/23/2020   Procedure: TRANSESOPHAGEAL ECHOCARDIOGRAM (TEE);  Surgeon: Freada Bergeron, MD;  Location: [facility] ENDOSCOPY;  Service: Cardiovascular;  Laterality: N/A;   TEMPORARY PACEMAKER N/A 03/10/2019   Procedure: TEMPORARY PACEMAKER;  Surgeon: Martinique, Peter M, MD;  Location: [facility];  Service: Cardiovascular;  Laterality: N/A;   Current Outpatient Medications  Medication Sig Dispense Refill   acetaminophen (TYLENOL) 500 MG tablet Take 500 mg by mouth every 6 (six) hours as needed for mild pain, fever or headache.     albuterol (VENTOLIN HFA) 108 (90 Base) MCG/ACT inhaler INHALE 2 PUFFS EVERY 6 HOURS AS NEEDED FOR WHEEZING OR SHORTNESS OF BREATH (Patient taking differently: Inhale 2 puffs into the lungs every 6 (six) hours as needed for wheezing.) 18 g 3   amLODipine (NORVASC) 10 MG tablet TAKE 1 TABLET EVERY DAY (Patient taking differently: Take 10 mg by mouth daily.) 90 tablet 3   amoxicillin (AMOXIL) 500 MG capsule Take 1 capsule (500 mg total) by mouth 3 (three) times daily for 7 days. Take two capsules now, then one capsule by mouth every 8 hours until all gone. 21 capsule 0   carvedilol (COREG) 25 MG tablet Take 1 tablet (25 mg total) by mouth 2 (two) times daily. 180 tablet 1   chlorhexidine (PERIDEX) 0.12 % solution 15 mLs by Mouth Rinse route 2 (two) times daily for 14 days. 420 mL 0   ferrous sulfate 325 (65 FE) MG tablet Take 325 mg by mouth every other day.      fluticasone (FLONASE) 50 MCG/ACT nasal spray USE 1 SPRAY IN EACH NOSTRIL EVERY DAY (Patient taking differently: Place 1 spray into both nostrils daily as needed for allergies.) 32 g 1   furosemide (LASIX) 40 MG tablet TAKE 2 TABLETS ('80MG'$ ) EVERY MORNING AND 1 TABLET ('40MG'$ ) EVERY EVENING (Patient taking differently: Take 40-80 mg by  mouth See admin instructions. '80mg'$  in the morning '40mg'$  in the evening) 270 tablet 1   guaiFENesin-dextromethorphan (ROBITUSSIN DM) 100-10 MG/5ML syrup Take 5 mLs by mouth every 4 (four) hours as needed for cough.     losartan (COZAAR) 50 MG tablet TAKE 1 TABLET EVERY DAY (Patient taking differently: Take 50 mg by mouth daily.) 90 tablet 3   Multiple Vitamin (MULTIVITAMIN) tablet Take 1 tablet by mouth daily.     multivitamin-lutein (OCUVITE-LUTEIN) CAPS Take 1 capsule by mouth daily.     polyvinyl alcohol (LIQUIFILM TEARS) 1.4 % ophthalmic solution Place 1 drop into both eyes as needed for dry eyes.     potassium chloride (KLOR-CON) 10 MEQ tablet TAKE 1 TABLET EVERY DAY (Patient taking differently: Take 10 mEq by mouth daily.) 90 tablet 3   vitamin B-12 (CYANOCOBALAMIN) 1000 MCG tablet TAKE  1 TABLET EVERY DAY (Patient taking differently: Take 1,000 mcg by mouth daily.) 90 tablet 3   XARELTO 20 MG TABS tablet TAKE 1 TABLET EVERY DAY WITH SUPPER (Patient taking differently: Take 20 mg by mouth daily with supper.) 90 tablet 3   No current facility-administered medications for this visit.   Facility-Administered Medications Ordered in Other Visits  Medication Dose Route Frequency Provider Last Rate Last Admin   [START ON 03/25/2021] ceFAZolin (ANCEF) IVPB 2g/100 mL premix  2 g Intravenous To OR Sherren Mocha, MD       Derrill Memo ON 03/25/2021] ceFAZolin (ANCEF) IVPB 2g/100 mL premix  2 g Intravenous To OR Sherren Mocha, MD       Derrill Memo ON 03/25/2021] dexmedetomidine (PRECEDEX) 400 MCG/100ML (4 mcg/mL) infusion  0.1-0.7 mcg/kg/hr Intravenous To OR Sherren Mocha, MD       [START ON 03/25/2021] EPINEPHrine (ADRENALIN) 5 mg in NS 250 mL (0.02 mg/mL) premix infusion  0-10 mcg/min Intravenous To OR Sherren Mocha, MD       Derrill Memo ON 03/25/2021] heparin 30,000 units/NS 1000 mL solution for CELLSAVER   Other To OR Sherren Mocha, MD       Derrill Memo ON 03/25/2021] heparin sodium (porcine) 5,000 Units, papaverine 60 mg  in electrolyte-A (PLASMALYTE-A PH 7.4) 1,000 mL irrigation   Irrigation To OR Sherren Mocha, MD       Derrill Memo ON 03/25/2021] insulin regular, human (MYXREDLIN) 100 units/ 100 mL infusion   Intravenous To OR Sherren Mocha, MD       Derrill Memo ON 03/25/2021] magnesium sulfate (IV Push/IM) injection 40 mEq  40 mEq Other To OR Sherren Mocha, MD       Derrill Memo ON 03/25/2021] milrinone (PRIMACOR) 20 MG/100 ML (0.2 mg/mL) infusion  0.3 mcg/kg/min Intravenous To OR Sherren Mocha, MD       [START ON 03/25/2021] nitroGLYCERIN 50 mg in dextrose 5 % 250 mL (0.2 mg/mL) infusion  2-200 mcg/min Intravenous To OR Sherren Mocha, MD       Derrill Memo ON 03/25/2021] norepinephrine (LEVOPHED) '4mg'$  in 275m premix infusion  0-40 mcg/min Intravenous To OR CSherren Mocha MD       [Derrill MemoON 03/25/2021] phenylephrine (NEO-SYNEPHRINE) '20mg'$ /NS 254mpremix infusion  30-200 mcg/min Intravenous To OR CoSherren MochaMD       [SDerrill MemoN 03/25/2021] potassium chloride injection 80 mEq  80 mEq Other To OR CoSherren MochaMD       [SDerrill MemoN 03/25/2021] tranexamic acid (CYKLOKAPRON) 2,500 mg in sodium chloride 0.9 % 250 mL (10 mg/mL) infusion  1.5 mg/kg/hr Intravenous To OR CoSherren MochaMD       [SDerrill MemoN 03/25/2021] tranexamic acid (CYKLOKAPRON) bolus via infusion - over 30 minutes 1,425 mg  15 mg/kg Intravenous To OR CoSherren MochaMD       [SDerrill MemoN 03/25/2021] tranexamic acid (CYKLOKAPRON) pump prime solution 190 mg  2 mg/kg Intracatheter To OR CoSherren MochaMD       [SDerrill MemoN 03/25/2021] vancomycin (VANCOREADY) IVPB 1500 mg/300 mL  1,500 mg Intravenous To OR CoSherren MochaMD       No Known Allergies   LABS: Lab Results  Component Value Date   WBC 8.4 03/23/2021   HGB 12.2 (L) 03/23/2021   HCT 37.6 (L) 03/23/2021   MCV 88.3 03/23/2021   PLT 119 (L) 03/23/2021   BMET    Component Value Date/Time   NA 137 03/23/2021 1429   NA 141 02/18/2021 1439   K 3.9 03/23/2021 1429   CL 103 03/23/2021 1429  CO2 26 03/23/2021 1429    GLUCOSE 122 (H) 03/23/2021 1429   BUN 17 03/23/2021 1429   BUN 10 02/18/2021 1439   CREATININE 0.91 03/23/2021 1429   CREATININE 0.94 12/01/2020 0808   CALCIUM 9.0 03/23/2021 1429   GFRNONAA >60 03/23/2021 1429   GFRNONAA 73 12/01/2020 0808   GFRAA 85 12/01/2020 0808    Lab Results  Component Value Date   INR 1.3 (H) 03/23/2021   INR 1.13 02/15/2012   No results found for: PTT   VITALS: BP 124/72 (BP Location: Right Arm, Patient Position: Sitting, Cuff Size: Normal)   Pulse 71   Temp 98.6 F (37 C) (Oral)    EXAM: Extraction sites appear to be healing WNL.  No signs of wound dehiscence or infection evident upon examination.  No sutures remain in-tact.   ASSESSMENT:   Postoperative course is consistent with dental procedures performed.   PROCEDURES: The patient was given a chlorhexidine gluconate rinse for 30 seconds.  Extractions sites were all irrigated with sterile saline for debris removal. Syringe and saline bottle given to the patient to use as needed.  He verbalized understanding.   PLAN AND RECOMMENDATIONS: Follow-up as needed.   Rx: Finish course of Amoxicillin. Continue to use chlorhexidine gluconate 0.12% rinses for 1 more week.  Establish care at an outside dental office for routine dental care including replacement of missing teeth as needed, cleanings/periodontal therapy and exams.   Recommend that the patient discuss plans to return to the dentist for non-urgent treatment with their medical team to ensure they are medically optimized and for any antibiotic prophylaxis that may be needed prior to dental procedures after heart surgery. Call if any questions or concerns arise.  All questions and concerns were invited and addressed.  The patient tolerated today's visit well and departed in stable condition.  Wishek Benson [place], D.M.D.

## 2021-03-25 ENCOUNTER — Inpatient Hospital Stay (HOSPITAL_COMMUNITY)
Admission: RE | Admit: 2021-03-25 | Discharge: 2021-03-26 | DRG: 267 | Disposition: A | Discharge status: Home / Self Care | Payer: Medicare HMO | Attending: Cardiovascular Disease | Admitting: Cardiovascular Disease

## 2021-03-25 ENCOUNTER — Encounter (HOSPITAL_COMMUNITY)
Admission: RE | Disposition: A | Discharge status: Home / Self Care | Payer: Medicare HMO | Source: Home / Self Care | Attending: Cardiovascular Disease

## 2021-03-25 ENCOUNTER — Other Ambulatory Visit: Payer: Self-pay

## 2021-03-25 ENCOUNTER — Inpatient Hospital Stay (HOSPITAL_COMMUNITY): Payer: Medicare HMO | Admitting: Emergency Medicine

## 2021-03-25 ENCOUNTER — Encounter: Payer: Self-pay | Admitting: Cardiology

## 2021-03-25 ENCOUNTER — Inpatient Hospital Stay (HOSPITAL_COMMUNITY): Payer: Medicare HMO

## 2021-03-25 ENCOUNTER — Other Ambulatory Visit: Payer: Self-pay | Admitting: Physician Assistant

## 2021-03-25 ENCOUNTER — Inpatient Hospital Stay (HOSPITAL_COMMUNITY): Payer: Medicare HMO | Admitting: Anesthesiology

## 2021-03-25 ENCOUNTER — Encounter (HOSPITAL_COMMUNITY): Payer: Self-pay | Admitting: Cardiovascular Disease

## 2021-03-25 DIAGNOSIS — Z950 Presence of cardiac pacemaker: Secondary | ICD-10-CM

## 2021-03-25 DIAGNOSIS — Z952 Presence of prosthetic heart valve: Secondary | ICD-10-CM | POA: Diagnosis not present

## 2021-03-25 DIAGNOSIS — Z20822 Contact with and (suspected) exposure to covid-19: Secondary | ICD-10-CM | POA: Diagnosis present

## 2021-03-25 DIAGNOSIS — K219 Gastro-esophageal reflux disease without esophagitis: Secondary | ICD-10-CM | POA: Diagnosis present

## 2021-03-25 DIAGNOSIS — M1990 Unspecified osteoarthritis, unspecified site: Secondary | ICD-10-CM | POA: Diagnosis present

## 2021-03-25 DIAGNOSIS — E669 Obesity, unspecified: Secondary | ICD-10-CM | POA: Diagnosis present

## 2021-03-25 DIAGNOSIS — Z006 Encounter for examination for normal comparison and control in clinical research program: Secondary | ICD-10-CM

## 2021-03-25 DIAGNOSIS — I340 Nonrheumatic mitral (valve) insufficiency: Principal | ICD-10-CM | POA: Diagnosis present

## 2021-03-25 DIAGNOSIS — I083 Combined rheumatic disorders of mitral, aortic and tricuspid valves: Secondary | ICD-10-CM | POA: Diagnosis not present

## 2021-03-25 DIAGNOSIS — Z79899 Other long term (current) drug therapy: Secondary | ICD-10-CM

## 2021-03-25 DIAGNOSIS — Z9889 Other specified postprocedural states: Secondary | ICD-10-CM | POA: Insufficient documentation

## 2021-03-25 DIAGNOSIS — N400 Enlarged prostate without lower urinary tract symptoms: Secondary | ICD-10-CM | POA: Diagnosis present

## 2021-03-25 DIAGNOSIS — I110 Hypertensive heart disease with heart failure: Secondary | ICD-10-CM | POA: Diagnosis not present

## 2021-03-25 DIAGNOSIS — Z7901 Long term (current) use of anticoagulants: Secondary | ICD-10-CM | POA: Diagnosis not present

## 2021-03-25 DIAGNOSIS — F32A Depression, unspecified: Secondary | ICD-10-CM | POA: Diagnosis present

## 2021-03-25 DIAGNOSIS — I5032 Chronic diastolic (congestive) heart failure: Secondary | ICD-10-CM | POA: Diagnosis present

## 2021-03-25 DIAGNOSIS — Z87891 Personal history of nicotine dependence: Secondary | ICD-10-CM | POA: Diagnosis not present

## 2021-03-25 DIAGNOSIS — I10 Essential (primary) hypertension: Secondary | ICD-10-CM | POA: Diagnosis present

## 2021-03-25 DIAGNOSIS — Z6831 Body mass index (BMI) 31.0-31.9, adult: Secondary | ICD-10-CM

## 2021-03-25 DIAGNOSIS — I482 Chronic atrial fibrillation, unspecified: Secondary | ICD-10-CM | POA: Diagnosis present

## 2021-03-25 DIAGNOSIS — I4819 Other persistent atrial fibrillation: Secondary | ICD-10-CM | POA: Diagnosis present

## 2021-03-25 DIAGNOSIS — F419 Anxiety disorder, unspecified: Secondary | ICD-10-CM | POA: Diagnosis present

## 2021-03-25 DIAGNOSIS — Z87438 Personal history of other diseases of male genital organs: Secondary | ICD-10-CM | POA: Diagnosis present

## 2021-03-25 DIAGNOSIS — Z85828 Personal history of other malignant neoplasm of skin: Secondary | ICD-10-CM | POA: Diagnosis not present

## 2021-03-25 DIAGNOSIS — Z95818 Presence of other cardiac implants and grafts: Secondary | ICD-10-CM

## 2021-03-25 DIAGNOSIS — I480 Paroxysmal atrial fibrillation: Secondary | ICD-10-CM | POA: Diagnosis not present

## 2021-03-25 DIAGNOSIS — E785 Hyperlipidemia, unspecified: Secondary | ICD-10-CM | POA: Diagnosis present

## 2021-03-25 HISTORY — PX: TRANSCATHETER MITRAL EDGE TO EDGE REPAIR: CATH118311

## 2021-03-25 HISTORY — DX: Other specified postprocedural states: Z98.890

## 2021-03-25 HISTORY — PX: TEE WITHOUT CARDIOVERSION: SHX5443

## 2021-03-25 HISTORY — DX: Nonrheumatic mitral (valve) insufficiency: I34.0

## 2021-03-25 LAB — ECHO TEE
MV M vel: 5.25 m/s
MV Peak grad: 110.3 mmHg
MV VTI: 2.77 cm2
MV Vena cont: 0.5 cm
Radius: 1 cm

## 2021-03-25 LAB — POCT ACTIVATED CLOTTING TIME
Activated Clotting Time: 266 seconds
Activated Clotting Time: 295 seconds
Activated Clotting Time: 335 seconds

## 2021-03-25 SURGERY — MITRAL VALVE REPAIR
Anesthesia: General

## 2021-03-25 MED ORDER — FUROSEMIDE 40 MG PO TABS: 40.0000 mg | ORAL_TABLET | ORAL | Status: DC

## 2021-03-25 MED ORDER — ROCURONIUM BROMIDE 10 MG/ML (PF) SYRINGE
PREFILLED_SYRINGE | INTRAVENOUS | Status: DC | PRN
  Administered 2021-03-25: 60 mg via INTRAVENOUS
  Administered 2021-03-25: 20 mg via INTRAVENOUS

## 2021-03-25 MED ORDER — LACTATED RINGERS IV SOLN: INTRAVENOUS | Status: DC | PRN

## 2021-03-25 MED ORDER — SODIUM CHLORIDE 0.9% FLUSH
3.0000 mL | Freq: Two times a day (BID) | INTRAVENOUS | Status: DC
  Administered 2021-03-25 – 2021-03-26 (×3): 3 mL via INTRAVENOUS

## 2021-03-25 MED ORDER — SUGAMMADEX SODIUM 200 MG/2ML IV SOLN
INTRAVENOUS | Status: DC | PRN
  Administered 2021-03-25: 200 mg via INTRAVENOUS

## 2021-03-25 MED ORDER — POTASSIUM CHLORIDE CRYS ER 10 MEQ PO TBCR
10.0000 meq | EXTENDED_RELEASE_TABLET | Freq: Every day | ORAL | Status: DC
  Administered 2021-03-26: 10 meq via ORAL
  Filled 2021-03-25: qty 1

## 2021-03-25 MED ORDER — HEPARIN (PORCINE) IN NACL 2000-0.9 UNIT/L-% IV SOLN
INTRAVENOUS | Status: AC
  Filled 2021-03-25: qty 3000

## 2021-03-25 MED ORDER — FUROSEMIDE 40 MG PO TABS
40.0000 mg | ORAL_TABLET | Freq: Every evening | ORAL | Status: DC
  Administered 2021-03-25: 40 mg via ORAL
  Filled 2021-03-25: qty 1

## 2021-03-25 MED ORDER — SODIUM CHLORIDE 0.9 % IV SOLN: INTRAVENOUS | Status: DC

## 2021-03-25 MED ORDER — CHLORHEXIDINE GLUCONATE 0.12 % MT SOLN: 15.0000 mL | Freq: Once | OROMUCOSAL | Status: AC

## 2021-03-25 MED ORDER — CHLORHEXIDINE GLUCONATE 4 % EX LIQD: 30.0000 mL | CUTANEOUS | Status: DC

## 2021-03-25 MED ORDER — SODIUM CHLORIDE 0.9 % IV SOLN: 250.0000 mL | INTRAVENOUS | Status: DC | PRN

## 2021-03-25 MED ORDER — ALBUTEROL SULFATE (2.5 MG/3ML) 0.083% IN NEBU: 2.5000 mg | INHALATION_SOLUTION | Freq: Four times a day (QID) | RESPIRATORY_TRACT | Status: DC | PRN

## 2021-03-25 MED ORDER — CHLORHEXIDINE GLUCONATE 4 % EX LIQD: 60.0000 mL | Freq: Once | CUTANEOUS | Status: DC

## 2021-03-25 MED ORDER — ONDANSETRON HCL 4 MG/2ML IJ SOLN
INTRAMUSCULAR | Status: DC | PRN
  Administered 2021-03-25: 4 mg via INTRAVENOUS

## 2021-03-25 MED ORDER — LOSARTAN POTASSIUM 50 MG PO TABS
50.0000 mg | ORAL_TABLET | Freq: Every day | ORAL | Status: DC
  Administered 2021-03-26: 50 mg via ORAL
  Filled 2021-03-25: qty 1

## 2021-03-25 MED ORDER — HEPARIN SODIUM (PORCINE) 1000 UNIT/ML IJ SOLN
INTRAMUSCULAR | Status: DC | PRN
  Administered 2021-03-25: 14000 [IU] via INTRAVENOUS
  Administered 2021-03-25 (×2): 4000 [IU] via INTRAVENOUS

## 2021-03-25 MED ORDER — ONDANSETRON HCL 4 MG/2ML IJ SOLN: 4.0000 mg | Freq: Four times a day (QID) | INTRAMUSCULAR | Status: DC | PRN

## 2021-03-25 MED ORDER — FUROSEMIDE 40 MG PO TABS
80.0000 mg | ORAL_TABLET | Freq: Every day | ORAL | Status: DC
  Administered 2021-03-26: 80 mg via ORAL
  Filled 2021-03-25: qty 2

## 2021-03-25 MED ORDER — FERROUS SULFATE 325 (65 FE) MG PO TABS
325.0000 mg | ORAL_TABLET | ORAL | Status: DC
  Administered 2021-03-26: 325 mg via ORAL
  Filled 2021-03-25: qty 1

## 2021-03-25 MED ORDER — ACETAMINOPHEN 500 MG PO TABS: 500.0000 mg | ORAL_TABLET | Freq: Four times a day (QID) | ORAL | Status: DC | PRN

## 2021-03-25 MED ORDER — CARVEDILOL 25 MG PO TABS
25.0000 mg | ORAL_TABLET | Freq: Two times a day (BID) | ORAL | Status: DC
  Administered 2021-03-25 – 2021-03-26 (×2): 25 mg via ORAL
  Filled 2021-03-25 (×2): qty 1

## 2021-03-25 MED ORDER — DEXAMETHASONE SODIUM PHOSPHATE 10 MG/ML IJ SOLN
INTRAMUSCULAR | Status: DC | PRN
  Administered 2021-03-25: 5 mg via INTRAVENOUS

## 2021-03-25 MED ORDER — PROPOFOL 10 MG/ML IV BOLUS
INTRAVENOUS | Status: DC | PRN
  Administered 2021-03-25: 60 mg via INTRAVENOUS

## 2021-03-25 MED ORDER — CHLORHEXIDINE GLUCONATE 0.12 % MT SOLN
OROMUCOSAL | Status: AC
  Administered 2021-03-25: 15 mL via OROMUCOSAL
  Filled 2021-03-25: qty 15

## 2021-03-25 MED ORDER — ACETAMINOPHEN 325 MG PO TABS
650.0000 mg | ORAL_TABLET | ORAL | Status: DC | PRN
  Administered 2021-03-25 – 2021-03-26 (×2): 650 mg via ORAL
  Filled 2021-03-25 (×2): qty 2

## 2021-03-25 MED ORDER — HEPARIN (PORCINE) IN NACL 1000-0.9 UT/500ML-% IV SOLN
INTRAVENOUS | Status: AC
  Filled 2021-03-25: qty 500

## 2021-03-25 MED ORDER — PROTAMINE SULFATE 10 MG/ML IV SOLN
INTRAVENOUS | Status: DC | PRN
  Administered 2021-03-25 (×2): 20 mg via INTRAVENOUS
  Administered 2021-03-25: 10 mg via INTRAVENOUS

## 2021-03-25 MED ORDER — HEPARIN (PORCINE) IN NACL 2000-0.9 UNIT/L-% IV SOLN
INTRAVENOUS | Status: DC | PRN
  Administered 2021-03-25 (×3): 2000 mL

## 2021-03-25 MED ORDER — CEFAZOLIN SODIUM-DEXTROSE 2-4 GM/100ML-% IV SOLN
2.0000 g | INTRAVENOUS | Status: DC
Start: 2021-03-25 — End: 2021-03-25

## 2021-03-25 MED ORDER — LIDOCAINE 2% (20 MG/ML) 5 ML SYRINGE
INTRAMUSCULAR | Status: DC | PRN
  Administered 2021-03-25: 60 mg via INTRAVENOUS

## 2021-03-25 MED ORDER — FENTANYL CITRATE (PF) 100 MCG/2ML IJ SOLN
INTRAMUSCULAR | Status: AC
  Filled 2021-03-25: qty 2

## 2021-03-25 MED ORDER — SODIUM CHLORIDE 0.9% FLUSH: 3.0000 mL | INTRAVENOUS | Status: DC | PRN

## 2021-03-25 MED ORDER — CHLORHEXIDINE GLUCONATE 0.12 % MT SOLN
15.0000 mL | Freq: Two times a day (BID) | OROMUCOSAL | Status: DC
  Filled 2021-03-25: qty 15

## 2021-03-25 MED ORDER — RIVAROXABAN 20 MG PO TABS
20.0000 mg | ORAL_TABLET | Freq: Every day | ORAL | Status: DC
  Administered 2021-03-25: 20 mg via ORAL
  Filled 2021-03-25: qty 1

## 2021-03-25 MED ORDER — AMLODIPINE BESYLATE 10 MG PO TABS
10.0000 mg | ORAL_TABLET | Freq: Every day | ORAL | Status: DC
  Administered 2021-03-26: 10 mg via ORAL
  Filled 2021-03-25: qty 1

## 2021-03-25 MED ORDER — HEPARIN (PORCINE) IN NACL 1000-0.9 UT/500ML-% IV SOLN
INTRAVENOUS | Status: DC | PRN
  Administered 2021-03-25: 500 mL

## 2021-03-25 MED ORDER — FENTANYL CITRATE (PF) 250 MCG/5ML IJ SOLN
INTRAMUSCULAR | Status: DC | PRN
  Administered 2021-03-25: 50 ug via INTRAVENOUS

## 2021-03-25 SURGICAL SUPPLY — 16 items
CATH MITRA STEERABLE GUIDE (CATHETERS) ×1 IMPLANT
CLIP MITRA G4 DELIVERY SYS NTW (Clip) ×1 IMPLANT
CLIP MITRA G4 DELIVERY SYS XTW (Clip) ×1 IMPLANT
CLOSURE PERCLOSE PROSTYLE (VASCULAR PRODUCTS) ×2 IMPLANT
KIT DILATOR VASC 18G NDL (KITS) ×1 IMPLANT
KIT HEART LEFT (KITS) ×6 IMPLANT
KIT MICROPUNCTURE NIT STIFF (SHEATH) ×1 IMPLANT
KIT VERSACROSS LRG ACCESS (CATHETERS) ×1 IMPLANT
PACK CARDIAC CATHETERIZATION (CUSTOM PROCEDURE TRAY) ×2 IMPLANT
SHEATH PINNACLE 8F 10CM (SHEATH) ×1 IMPLANT
SHEATH PROBE COVER 6X72 (BAG) ×3 IMPLANT
STOPCOCK MORSE 400PSI 3WAY (MISCELLANEOUS) ×12 IMPLANT
SYSTEM MITRACLIP G4 (SYSTAGENIX WOUND MANAGEMENT) ×1 IMPLANT
TRANSDUCER W/STOPCOCK (MISCELLANEOUS) ×3 IMPLANT
TUBING ART PRESS 72  MALE/FEM (TUBING) ×2
TUBING ART PRESS 72 MALE/FEM (TUBING) ×1 IMPLANT

## 2021-03-25 NOTE — Transfer of Care (Signed)
Immediate Anesthesia Transfer of Care Note  Patient: Austin Gray.  Procedure(s) Performed: MITRAL VALVE REPAIR TRANSESOPHAGEAL ECHOCARDIOGRAM (TEE)  Patient Location: Cath Lab  Anesthesia Type:General  Level of Consciousness: awake, alert  and oriented  Airway & Oxygen Therapy: Patient Spontanous Breathing and Patient connected to face mask oxygen  Post-op Assessment: Report given to RN and Post -op Vital signs reviewed and stable  Post vital signs: Reviewed and stable  Last Vitals:  Vitals Value Taken Time  BP 113/67 03/25/21 1027  Temp    Pulse 70 03/25/21 1028  Resp 19 03/25/21 1028  SpO2 92 % 03/25/21 1028  Vitals shown include unvalidated device data.  Last Pain:  Vitals:   03/25/21 0616  PainSc: 0-No pain         Complications: No notable events documented.

## 2021-03-25 NOTE — Progress Notes (Signed)
Austin Gray, [employer] rep returned call back regarding pacer. Stated patient was "not on their schedule for today, recommends using a magnet during surgery and to send a transmission afterwards". Recommendation relayed to Doreen Salvage, pre-op RN caring for patient at this time.

## 2021-03-25 NOTE — Plan of Care (Signed)

## 2021-03-25 NOTE — Anesthesia Procedure Notes (Signed)
Arterial Line Insertion Start/End9/02/2021 7:05 AM  Patient location: Pre-op. Preanesthetic checklist: patient identified, IV checked, site marked, risks and benefits discussed, surgical consent, monitors and equipment checked, pre-op evaluation, timeout performed and anesthesia consent Lidocaine 1% used for infiltration Right, radial was placed Catheter size: 20 G Hand hygiene performed  and maximum sterile barriers used   Attempts: 1 Procedure performed without using ultrasound guided technique. Following insertion, dressing applied and Biopatch. Post procedure assessment: normal and unchanged  Patient tolerated the procedure well with no immediate complications.

## 2021-03-25 NOTE — H&P (Signed)
[facility] [place]                                     Cardiology Office Note:     Date:  02/18/2021    ID:  Austin Mesi., DOB [birth year], MRN [id number]   PCP:  [name], MD             [facility] Heart[facility] Cardiologist:  Minus Breeding, MD  [facility] HeartCare Electrophysiologist:  None    Referring MD: [name], MD    Continue discussion of MitraClip work up- set up cath   History of Present Illness:     Austin Balling. is a 87 y.o. male with a hx of chronic persistent atrial fibrillation on Xarelto, CHB s/p PPM, HTN, chronic diastolic heart failure and severe mitral regurgitation who presents to clinic for follow up.    Echo 12/07/20 showed EF 55%, severe biatrial enlargement and severe mitral regurgitation. The mitral valve is myxomatous. The jet is eccentric and anteriorly directed. There is splay artifact suggestive of at least moderate regurgitation. The MR is poorly quantitated. Most impresive  images are PLAX. Suspect it is just very eccentric. There is likely a flail PMVL chord to explain these findings, and abrupt change from prior echo. Subsequent TEE 12/23/20 showed EF 55%, mild RV dysfunction /  moderate enlargement. RV pacing wire is present. RVSP is 33mHg+RAP. Severe biatrial enlargement. Severe TR (suspected to be functional due to severe annular dilation). 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 EROA 0.4cm2 with RVol 782mby PISA. No evidence of mitral stenosis.    He was seen by Dr. CoBurt Knackn 12/30/20 for structural heart consultation and discussion of potential Mitral Valve TEER. The patient's transesophageal echo was reviewed and demonstrates findings consistent with severe mitral regurgitation secondary to degenerative changes of ruptured primary chordae tendonae and flail P2 segment  of the mitral valve with eccentric MR.  He did not appear to be particularly limited at that time with only mild shortness of breath. After a full discussion with the patient about the further work up for MitraClip including a L/RCH and technical details and risk surrounding the procedure, the patient preferred to discuss things further with his family before proceeding with any more diagnostic testing.  He was not sure that he wanted anything done at that time. He later discussed this with his family and decided he would like to proceed.    Today the patient presents to clinic for follow up. Here with granddaughter. He does have shortness of breath with any type of exertion. He mostly sits but can walk with a rolling walker. He has chronic LE edema and orthopnea. No PND. No dizziness or syncope.  Has not seen a dentist in decades. Most of his teeth are broken or rotten. He has no pain. He is mostly worried about the cost surrounding his care and says that he has no money and cannot afford the copay for the cath. He is hoping to switch insurance next month.          Past Medical History:  Diagnosis Date   Anxiety     Arthritis      "little bit; left knee"   Atrial fibrillation ([facility]  Chronic   Basal cell carcinoma     Cataracts, bilateral      to have left eye cataract surgery on 03/21/17    CHF (congestive heart failure), NYHA class II, chronic, diastolic (HCC)     Colon polyps      last colonoscopy 2015 (tubular adenoma)   Depression     Diverticulosis     Gastritis     GERD (gastroesophageal reflux disease)     H/O: GI bleed      Recurrent   Hemorrhoids     History of BPH     History of kidney stones      hx of x 1    Hypertension     Pernicious anemia     PVC (premature ventricular contraction)     Rectal bleeding 02/15/12   Vitamin D deficiency             Past Surgical History:  Procedure Laterality Date   COLONOSCOPY WITH PROPOFOL N/A 03/17/2017    Procedure:  COLONOSCOPY WITH PROPOFOL;  Surgeon: Carol Ada, MD;  Location: [facility] ENDOSCOPY;  Service: Endoscopy;  Laterality: N/A;   [place]; ~ 2002    left; right   PACEMAKER IMPLANT N/A 03/11/2019    Procedure: PACEMAKER IMPLANT;  Surgeon: Constance Haw, MD;  Location: [place] [facility];  Service: Cardiovascular;  Laterality: N/A;   TEE WITHOUT CARDIOVERSION N/A 12/23/2020    Procedure: TRANSESOPHAGEAL ECHOCARDIOGRAM (TEE);  Surgeon: Freada Bergeron, MD;  Location: [facility] ENDOSCOPY;  Service: Cardiovascular;  Laterality: N/A;   TEMPORARY PACEMAKER N/A 03/10/2019    Procedure: TEMPORARY PACEMAKER;  Surgeon: Martinique, Peter M, MD;  Location: [place] [facility];  Service: Cardiovascular;  Laterality: N/A;      Current Medications:     Current Meds  Medication Sig   albuterol (VENTOLIN HFA) 108 (90 Base) MCG/ACT inhaler INHALE 2 PUFFS EVERY 6 HOURS AS NEEDED FOR WHEEZING OR SHORTNESS OF BREATH   amLODipine (NORVASC) 10 MG tablet TAKE 1 TABLET EVERY DAY   carvedilol (COREG) 25 MG tablet Take 1 tablet (25 mg total) by mouth 2 (two) times daily.   ferrous sulfate 325 (65 FE) MG tablet Take 325 mg by mouth every other day.    fluticasone (FLONASE) 50 MCG/ACT nasal spray USE 1 SPRAY IN EACH NOSTRIL EVERY DAY   furosemide (LASIX) 40 MG tablet TAKE 2 TABLETS ('80MG'$ ) EVERY MORNING AND 1 TABLET ('40MG'$ ) EVERY EVENING   losartan (COZAAR) 50 MG tablet TAKE 1 TABLET EVERY DAY   Multiple Vitamin (MULTIVITAMIN) tablet Take 1 tablet by mouth daily.   multivitamin-lutein (OCUVITE-LUTEIN) CAPS Take 1 capsule by mouth daily.   potassium chloride (KLOR-CON) 10 MEQ tablet TAKE 1 TABLET EVERY DAY   vitamin B-12 (CYANOCOBALAMIN) 1000 MCG tablet TAKE 1 TABLET EVERY DAY   XARELTO 20 MG TABS tablet TAKE 1 TABLET EVERY DAY WITH SUPPER      Allergies:   Patient has no known allergies.    Social History         Socioeconomic History   Marital status: Widowed      Spouse name: Not on file   Number  of children: 1   Years of education: Not on file   Highest education level: Not on file  Occupational History   Occupation: Retired  Tobacco Use   Smoking status: Never   Smokeless tobacco: Former      Types: Snuff      Quit date: 01/26/2018  Tobacco comments:      02/15/12 "still use snuff @ times; not daily"  Vaping Use   Vaping Use: Never used  Substance and Sexual Activity   Alcohol use: No   Drug use: No   Sexual activity: Never      Comment: widower, lives with granddaughter and great grandchildren.  Other Topics Concern   Not on file  Social History Narrative    Lives with grand daughter.              Epworth Sleepiness Scale = 7 (as of 09/21/2015)    Social Determinants of Health       Financial Resource Strain: Low Risk    Difficulty of Paying Living Expenses: Not very hard  Food Insecurity: Not on file  Transportation Needs: Not on file  Physical Activity: Not on file  Stress: Not on file  Social Connections: Not on file      Family History: The patient's family history includes Cancer in his mother; Stroke (age of onset: 2) in his father.   ROS:   Please see the history of present illness.    All other systems reviewed and are negative.   EKGs/Labs/Other Studies Reviewed:     The following studies were reviewed today:   Echo: 11/17/20 IMPRESSIONS   1. There is likely severe mitral regurgitation. The jet is eccentric and  anteriorly directed. There is splay artifact suggestive of at least  moderate regurgitation. The MR is poorly quantitated. Most impresive  images are PLAX. Suspect it is just very  eccentric. There is likely a flail PMVL chord to explain these findings,  and abrupt change from prior echo. Would recommend a TEE for better  characterization. The mitral valve is myxomatous. Severe mitral valve  regurgitation. No evidence of mitral  stenosis.   2. Left ventricular ejection fraction, by estimation, is 55 to 60%. The  left ventricle has  normal function. The left ventricle has no regional  wall motion abnormalities. Left ventricular diastolic function could not  be evaluated.   3. Right ventricular systolic function is normal. The right ventricular  size is mildly enlarged. There is mildly elevated pulmonary artery  systolic pressure. The estimated right ventricular systolic pressure is  0000000 mmHg.   4. Left atrial size was severely dilated.   5. Right atrial size was severely dilated.   6. Tricuspid valve regurgitation is mild to moderate.   7. The aortic valve is tricuspid. There is mild calcification of the  aortic valve. Aortic valve regurgitation is trivial. Mild aortic valve  sclerosis is present, with no evidence of aortic valve stenosis.   8. The inferior vena cava is dilated in size with >50% respiratory  variability, suggesting right atrial pressure of 8 mmHg.   Comparison(s): Changes from prior study are noted. MR is now severe. TEE  recommended.   __________________________   TEE:12/23/20 IMPRESSIONS  1. The mitral valve is myxomatous. There is chordal rutpure with P2 flail  and P3 prolapse with resultant severe, highly eccentric anteriorly  directed mitral regurgitation. EROA 0.4cm2 with RVol 72m by PISA. No  evidence of mitral stenosis.   2. Left ventricular ejection fraction, by estimation, is 55 to 60%. The  left ventricle has normal function.   3. Right ventricular systolic function is mildly reduced. The right  ventricular size is moderately enlarged. RV pacing wire is present. RVSP  is 354mg+RAP.   4. Left atrial size was severely dilated. No left atrial/left atrial  appendage thrombus  was detected.   5. Right atrial size was severely dilated.   6. Tricuspid valve regurgitation is severe. Suspect functional due to  annular dilation.   7. The aortic valve is tricuspid. There is mild calcification of the  aortic valve. There is mild thickening of the aortic valve. Aortic valve  regurgitation is  trivial. Mild aortic valve sclerosis is present, with no  evidence of aortic valve stenosis.   8. There is Moderate (Grade III) plaque.   FINDINGS   Left Ventricle: Left ventricular ejection fraction, by estimation, is 55  to 60%. The left ventricle has normal function. The left ventricular  internal cavity size was normal in size.   Right Ventricle: The right ventricular size is moderately enlarged. No  increase in right ventricular wall thickness. Right ventricular systolic  function is mildly reduced.   Left Atrium: Left atrial size was severely dilated. No left atrial/left  atrial appendage thrombus was detected.   Right Atrium: Right atrial size was severely dilated.   Pericardium: There is no evidence of pericardial effusion.   Mitral Valve: The mitral valve is myxomatous. There is chordal rutpure  with P2 flail and P3 prolapse with resultant severe, highly eccentric  anteriorly directed mitral regurgitation. EROA 0.4cm2 with RVol 77m by  PISA. No evidence of mitral valve  stenosis.   Tricuspid Valve: The tricuspid valve is normal in structure. Tricuspid  valve regurgitation is severe.   Aortic Valve: The aortic valve is tricuspid. There is mild calcification  of the aortic valve. There is mild thickening of the aortic valve. Aortic  valve regurgitation is trivial. Mild aortic valve sclerosis is present,  with no evidence of aortic valve  stenosis.   Pulmonic Valve: The pulmonic valve was normal in structure. Pulmonic valve  regurgitation is trivial.   Aorta: The aortic root is normal in size and structure. There is moderate  (Grade III) plaque.   IAS/Shunts: There is left bowing of the interatrial septum, suggestive of  elevated right atrial pressure. No atrial level shunt detected by color  flow Doppler.   Additional Comments: A device lead is visualized.      MR Peak grad:    124.5 mmHg  TRICUSPID VALVE  MR Mean grad:    74.0 mmHg   TR Peak grad:   38.9 mmHg   MR Vmax:         558.00 cm/s TR Vmax:        312.00 cm/s  MR Vmean:        397.0 cm/s  MR PISA:         6.28 cm  MR PISA Eff ROA: 38 mm  MR PISA Radius:  1.00 cm   EKG:  EKG is ordered today.  This shows afib with LBBB HR 70   Recent Labs: 12/01/2020: ALT 18 12/21/2020: BUN 12; Creatinine, Ser 0.96; Hemoglobin 13.0; Platelets 121; Potassium 4.0; Sodium 141  Recent Lipid Panel         Component Value Date/Time    CHOL 179 12/01/2020 0808    TRIG 58 12/01/2020 0808    HDL 62 12/01/2020 0808    CHOLHDL 2.9 12/01/2020 0808    VLDL 11 04/08/2016 0823    LDLCALC 103 (H) 12/01/2020 0808        Risk Assessment/Calculations:     CHA2DS2-VASc Score = 5  This indicates a 7.2% annual risk of stroke. The patient's score is based upon: CHF History: Yes HTN History: Yes Diabetes History: No Stroke History:  No Vascular Disease History: Yes Age Score: 2 Gender Score: 0      Physical Exam:     VS:  BP 128/70   Pulse 70   Ht '5\' 10"'$  (1.778 m)   Wt 217 lb 12.8 oz (98.8 kg)   SpO2 98%   BMI 31.25 kg/m         Wt Readings from Last 3 Encounters:  02/18/21 217 lb 12.8 oz (98.8 kg)  02/11/21 220 lb (99.8 kg)  12/30/20 224 lb 12.8 oz (102 kg)      GEN: Elderly and disheveled appearing.  HEENT: Normal. Teeth appear to be mostly rotting, brown stubs, several missing NECK: No JVD; LYMPHATICS: No lymphadenopathy CARDIAC: irrreg irreg, 3/6 holosystolic murmur at apexl. No rubs, gallops RESPIRATORY:  Clear to auscultation without rales, wheezing or rhonchi  ABDOMEN: Soft, non-tender, non-distended MUSCULOSKELETAL:  No edema; No deformity  SKIN: Warm and dry. 1 + bilateral LE edema.  NEUROLOGIC:  Alert and oriented x 3 PSYCHIATRIC:  Normal affect      STS score Procedure: Isolated MVR Risk of Mortality: 4.460% Renal Failure: 2.851% Permanent Stroke: 1.809% Prolonged Ventilation: 11.539% DSW Infection: 0.137% Reoperation: 6.619% Morbidity or Mortality: 17.777% Short  Length of Stay: 19.567% Long Length of Stay: 9.441%   _________________________   Procedure: MV Repair Risk of Mortality: 3.477% Renal Failure: 2.414% Permanent Stroke: 1.907% Prolonged Ventilation: 9.069% DSW Infection: 0.072% Reoperation: 4.129% Morbidity or Mortality: 14.202% Short Length of Stay: 26.921% Long Length of Stay: 6.677%     ASSESSMENT:     1. Nonrheumatic mitral valve regurgitation   2. Permanent atrial fibrillation ([place])   3. Essential hypertension   4. Cardiac pacemaker in situ     PLAN:     In order of problems listed above:   Severe non rheumatic MR: with NYHA class III symptoms. I have reviewed the natural history of severe mitral regurgitation with the patient. We have discussed the limitations of medical therapy and the poor prognosis associated with symptomatic mitral regurigation. We have reviewed potential treatment options, including palliative medical therapy, conventional surgical mitral valve replacement/repair, and percutaneous therapies such as edge to edge mitral valve approximation with MitraClip. We discussed treatment options in the context of this patient's specific comorbid medical conditions. He wishes to proceed with further work up. Next step is L/RHC with Dr. Burt Knack which has been set up for 8/10. Will also need a dental consultation.    Persistent atrial fib: continue Xarelto, given instructions on when to hold for cath. Rate well controlled.    HTN: BP well controlled. No changes made   S/p PPM: followed by Dr. Curt Bears.     ADDENDUM: Pt presents today for transcatheter edge to edge mitral valve repair (MitraClip). He has undergone cardiac catheterization showing no obstructive CAD and confirming hemodynamically significant MR with large V waves. He has undergone formal cardiac surgical consultation by Dr Kipp Brood and he is not a candidate for cardiac surgery secondary to advanced age, poor functional capacity, and comorbid  medical problems. On my exam today: Vitals:   03/25/21 0604  BP: (!) 141/78  Pulse: 73  Resp: 17  Temp: 98.3 F (36.8 C)  SpO2: 94%   Pt is alert and oriented, elderly male, in NAD HEENT: normal Neck: JVP - normal Lungs: CTA bilaterally CV: RRR with 4/6 holosystolic murmur at the apex Abd: soft, NT, Positive BS, no hepatomegaly Ext: trace ankle edema bilaterally, distal pulses intact and equal Skin: warm/dry no rash  The patient  is medically stable to proceed with MitraClip. He reports no change in his chronic exertional dyspnea. He has NYHA functional class III limitation with exertional dyspnea/chronic diastolic heart failure secondary to severe mitral regurgitation with a flail P2 segment of the mitral valve. All of his questions are answered. Risks/indications/alternatives to MitraClip have been fully reviewed with the patient who understands and agrees to proceed. He understands that serious complications such as stroke, MI, vascular injury, bleeding, arrhythmia, device embolization, cardiac injury, hemopericardium, occur at low frequency of 1-2%. He agrees to proceed.   Sherren Mocha 03/25/2021 7:36 AM   Sherren Mocha 03/25/2021 7:35 AM

## 2021-03-25 NOTE — Progress Notes (Signed)
Called [employer] regarding PPM.  Awaiting return call.

## 2021-03-25 NOTE — Progress Notes (Signed)
  Echocardiogram Echocardiogram Transesophageal has been performed.  Austin Gray 03/25/2021, 10:47 AM

## 2021-03-25 NOTE — Plan of Care (Signed)
  Problem: Education: Goal: Knowledge of General Education information will improve Description: Including pain rating scale, medication(s)/side effects and non-pharmacologic comfort measures Outcome: Progressing   Problem: Clinical Measurements: Goal: Diagnostic test results will improve Outcome: Progressing   Problem: Nutrition: Goal: Adequate nutrition will be maintained Outcome: Progressing   Problem: Coping: Goal: Level of anxiety will decrease Outcome: Progressing   Problem: Elimination: Goal: Will not experience complications related to urinary retention Outcome: Progressing   Problem: Safety: Goal: Ability to remain free from injury will improve Outcome: Progressing

## 2021-03-25 NOTE — Discharge Instructions (Signed)
Home Care Following Your MitraClip Procedure      If you have any questions or concerns you can call the structural heart office at [phone] during normal business hours 8am-4pm. If you have an urgent need after hours or on the weekend, please call [phone] to talk to the on call provider for general cardiology. If you have an emergency that requires immediate attention, please call 911.   Groin Site Care Refer to this sheet in the next few weeks. These instructions provide you with information on caring for yourself after your procedure. Your caregiver may also give you more specific instructions. Your treatment has been planned according to current medical practices, but problems sometimes occur. Call your caregiver if you have any problems or questions after your procedure. HOME CARE INSTRUCTIONS  You may shower 24 hours after the procedure. Remove the bandage (dressing) and gently wash the site with plain soap and water. Gently pat the site dry.   Do not apply powder or lotion to the site.   Do not sit in a bathtub, swimming pool, or whirlpool for 5 to 7 days.   No bending, squatting, or lifting anything over 10 pounds (4.5 kg) as directed by your caregiver.   Inspect the site at least twice daily.   Do not drive home if you are discharged the same day of the procedure. Have someone else drive you.   You may drive 72 hours after the procedure unless otherwise instructed by your caregiver.  What to expect:  Any bruising will usually fade within 1 to 2 weeks.   Blood that collects in the tissue (hematoma) may be painful to the touch. It should usually decrease in size and tenderness within 1 to 2 weeks.  SEEK IMMEDIATE MEDICAL CARE IF:  You have unusual pain at the groin site or down the affected leg.   You have redness, warmth, swelling, or pain at the groin site.   You have drainage (other than a small amount of blood on the dressing).   You have chills.   You have a  fever or persistent symptoms for more than 72 hours.   You have a fever and your symptoms suddenly get worse.   Your leg becomes pale, cool, tingly, or numb.   You have bleeding from the site. Hold pressure on the site until it subsides.    After MitraClip Checklist  Check  Test Description   Follow up appointment in 1-2 weeks  Most of our patients will see our structural heart physician assistant, Katie Fairley Copher, or your primary cardiologist within 1-2 weeks. Your incision site will be checked and you will be cleared to resume all normal activities if you are doing well.     1 month echo and follow up  You will have an echo to check on your heart valve clip and be seen back in the office by Katie Matix Henshaw PA-C.   Follow up with your primary cardiologist You will need to be seen by your primary cardiologist in the following 3-6 months after your 1 month appointment in the valve clinic. Often times your Plavix or Aspirin will be discontinued during this time, but this is decided on a case by case basis.    1 year echo and follow up You will have another echo to check on your heart valve after one year and be seen back in the office by Katie Dyanara Cozza. This your last structural heart visit.   Bacterial endocarditis prophylaxis  You will   have to take antibiotics for the rest of your life before all dental procedures (even dental cleanings) to protect your heart valve from potential infection. Antibiotics are also required before some surgeries. Please check with your cardiologist before scheduling any surgeries. Also, please make sure to tell us if you have a penicillin allergy as you will require an alternative antibiotic.    ______________  Your Implant Identification Card Following your procedure, you will receive an Implant Identification Card, which your doctor will fill out and which you must carry with you at all times. Show your Implant Identification Card if you report to an emergency room.  This card identifies you as a patient who has had a MitraClip device implanted. If you require a magnetic resonance imaging (MRI) scan, tell your doctor or MRI technician that you have a MitraClip device implanted. Test results indicate that patients with the MitraClip device can safely undergo MRI scans under certain conditions described on the card.  

## 2021-03-25 NOTE — Anesthesia Procedure Notes (Signed)
Procedure Name: Intubation Date/Time: 03/25/2021 7:55 AM Performed by: Carolan Clines, CRNA Pre-anesthesia Checklist: Patient identified, Emergency Drugs available, Suction available and Patient being monitored Patient Re-evaluated:Patient Re-evaluated prior to induction Oxygen Delivery Method: Circle System Utilized Preoxygenation: Pre-oxygenation with 100% oxygen Induction Type: IV induction Ventilation: Mask ventilation without difficulty and Oral airway inserted - appropriate to patient size Laryngoscope Size: Mac and 4 Grade View: Grade I Tube type: Oral Tube size: 7.5 mm Number of attempts: 1 Airway Equipment and Method: Stylet and Oral airway Placement Confirmation: ETT inserted through vocal cords under direct vision, positive ETCO2 and breath sounds checked- equal and bilateral Secured at: 23 cm Tube secured with: Tape Dental Injury: Teeth and Oropharynx as per pre-operative assessment

## 2021-03-25 NOTE — Anesthesia Postprocedure Evaluation (Signed)
Anesthesia Post Note  Patient: Austin Gray.  Procedure(s) Performed: MITRAL VALVE REPAIR TRANSESOPHAGEAL ECHOCARDIOGRAM (TEE)     Patient location during evaluation: PACU Anesthesia Type: General Level of consciousness: awake and alert Pain management: pain level controlled Vital Signs Assessment: post-procedure vital signs reviewed and stable Respiratory status: spontaneous breathing, nonlabored ventilation and respiratory function stable Cardiovascular status: blood pressure returned to baseline and stable Postop Assessment: no apparent nausea or vomiting Anesthetic complications: no   No notable events documented.  Last Vitals:  Vitals:   03/25/21 1125 03/25/21 1150  BP: 126/63   Pulse: 72   Resp: (!) 21 16  Temp:  (!) 36.4 C  SpO2: 96%     Last Pain:  Vitals:   03/25/21 1150  TempSrc: Oral  PainSc: 0-No pain                 Audry Pili

## 2021-03-25 NOTE — Progress Notes (Signed)
Spoke with Windle Guard, rep for [employer].  He stated will be at patient bedside by 0700.

## 2021-03-26 ENCOUNTER — Encounter: Payer: Self-pay | Admitting: Physician Assistant

## 2021-03-26 ENCOUNTER — Inpatient Hospital Stay (HOSPITAL_COMMUNITY): Payer: Medicare HMO

## 2021-03-26 ENCOUNTER — Encounter (HOSPITAL_COMMUNITY): Payer: Self-pay | Admitting: Cardiovascular Disease

## 2021-03-26 DIAGNOSIS — Z952 Presence of prosthetic heart valve: Secondary | ICD-10-CM

## 2021-03-26 DIAGNOSIS — Z20822 Contact with and (suspected) exposure to covid-19: Secondary | ICD-10-CM | POA: Diagnosis not present

## 2021-03-26 DIAGNOSIS — Z006 Encounter for examination for normal comparison and control in clinical research program: Secondary | ICD-10-CM | POA: Diagnosis not present

## 2021-03-26 DIAGNOSIS — Z95818 Presence of other cardiac implants and grafts: Secondary | ICD-10-CM

## 2021-03-26 DIAGNOSIS — Z9889 Other specified postprocedural states: Secondary | ICD-10-CM

## 2021-03-26 DIAGNOSIS — I482 Chronic atrial fibrillation, unspecified: Secondary | ICD-10-CM | POA: Diagnosis not present

## 2021-03-26 DIAGNOSIS — Z8719 Personal history of other diseases of the digestive system: Secondary | ICD-10-CM | POA: Insufficient documentation

## 2021-03-26 DIAGNOSIS — I340 Nonrheumatic mitral (valve) insufficiency: Secondary | ICD-10-CM | POA: Diagnosis not present

## 2021-03-26 LAB — CBC
HCT: 37 % — ABNORMAL LOW (ref 39.0–52.0)
Hemoglobin: 12.1 g/dL — ABNORMAL LOW (ref 13.0–17.0)
MCH: 28.7 pg (ref 26.0–34.0)
MCHC: 32.7 g/dL (ref 30.0–36.0)
MCV: 87.7 fL (ref 80.0–100.0)
Platelets: 107 10*3/uL — ABNORMAL LOW (ref 150–400)
RBC: 4.22 MIL/uL (ref 4.22–5.81)
RDW: 15.2 % (ref 11.5–15.5)
WBC: 7.6 10*3/uL (ref 4.0–10.5)
nRBC: 0 % (ref 0.0–0.2)

## 2021-03-26 LAB — BASIC METABOLIC PANEL
Anion gap: 7 (ref 5–15)
BUN: 10 mg/dL (ref 8–23)
CO2: 27 mmol/L (ref 22–32)
Calcium: 8.5 mg/dL — ABNORMAL LOW (ref 8.9–10.3)
Chloride: 102 mmol/L (ref 98–111)
Creatinine, Ser: 0.72 mg/dL (ref 0.61–1.24)
GFR, Estimated: >60 mL/min (ref >60)
Glucose, Bld: 159 mg/dL — ABNORMAL HIGH (ref 70–99)
Potassium: 3.8 mmol/L (ref 3.5–5.1)
Sodium: 136 mmol/L (ref 135–145)

## 2021-03-26 LAB — ECHOCARDIOGRAM COMPLETE
AR max vel: 2.24 cm2
AV Area VTI: 2.13 cm2
AV Area mean vel: 2.15 cm2
AV Mean grad: 4 mmHg
AV Peak grad: 6.9 mmHg
Ao pk vel: 1.31 m/s
Calc EF: 34.3 %
Height: 70 in
MV M vel: 5.57 m/s
MV Peak grad: 124.1 mmHg
MV VTI: 1.28 cm2
P 1/2 time: 121.1 msec
P 1/2 time: 516 msec
S' Lateral: 4.2 cm
Single Plane A2C EF: 49.5 %
Single Plane A4C EF: 5.5 %
Weight: 3344 oz

## 2021-03-26 NOTE — Discharge Summary (Addendum)
[place] VALVE TEAM  Discharge Summary    Patient ID: Austin Gray. MRN: [id number]; DOB: [birth year]  Admit date: 03/25/2021 Discharge date: 03/26/2021  Primary Care Provider: [name], MD  Primary Cardiologist: Minus Breeding, MD/ Dr. Sherren Mocha, MD   Discharge Diagnoses    Principal Problem:   S/P mitral valve clip implantation Active Problems:   Hypertension   History of BPH   Nonrheumatic mitral (valve) insufficiency   Chronic diastolic heart failure ([place])   Morbid obesity ([place])   Dyslipidemia   Cardiac pacemaker in situ   Chronic atrial fibrillation ([facility])  Allergies No Known Allergies  Diagnostic Studies/Procedures    Mitral Valve Repair (TEER) 03/25/21:  Successful transcatheter edge-to-edge mitral valve repair with placement of a MitraClip G4 XTW clip positioned A2/P2 and placement of a MitraClip G4 NTW clip positioned on the lateral side of A2/P2, reducing mitral regurgitation from 4+ at baseline to 1-2+ post procedure  Recommend to resume Rivaroxaban, at currently prescribed dose and frequency on 03/25/2021. Concurrent antiplatelet therapy not recommended _____________   Echo 03/26/21: completed but pending formal read at the time of discharge   History of Present Illness     Austin Legall. is a 87 y.o. male with a history of chronic persistent atrial fibrillation on Xarelto, CHB s/p PPM, HTN, chronic diastolic heart failure and severe mitral regurgitation who presented to [facility] 03/25/21 for planned transcatheter edge-to-edge mitral valve repair with MitraClip performed by Dr. Burt Knack.   Echo 12/07/20 showed EF 55%, severe biatrial enlargement and severe mitral regurgitation. The mitral valve is myxomatous. The jet is eccentric and anteriorly directed. There is splay artifact suggestive of at least moderate regurgitation. The MR is poorly quantitated. Most impresive  images are PLAX. Suspect it is just very  eccentric. There is likely a flail PMVL chord to explain these findings, and abrupt change from prior echo. Subsequent TEE 12/23/20 showed EF 55%, mild RV dysfunction/moderate enlargement. RV pacing wire is present. RVSP is 84mHg+RAP. Severe biatrial enlargement. Severe TR (suspected to be functional due to severe annular dilation). 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 EROA 0.4cm2 with RVol 748mby PISA. No evidence of mitral stenosis.    He was seen by Dr. CoBurt Knackn 12/30/20 for structural heart consultation and discussion of potential Mitral Valve TEER. The patient's transesophageal echo was reviewed and demonstrates findings consistent with severe mitral regurgitation secondary to degenerative changes of ruptured primary chordae tendonae and flail P2 segment of the mitral valve with eccentric MR. He did not appear to be particularly limited at that time with only mild shortness of breath. He initially decided to to wait but then later changed his mind. L/RHC on 02/24/21 showed mild diffuse CAD and confirmed severe MR with large V waves (28 mm hg).  The patient has been evaluated by the multidisciplinary valve team and felt to have severe, symptomatic mitral regurgitation and to be a suitable candidate for TEER, which was set up for 03/25/21.  Hospital Course     He underwent successful transcatheter edge-to-edge mitral valve repair with placement of a MitraClip G4 XTW clip in the A2/P2 and placement of a MitraClip G4 NTW positioned in the lateral side of A2/P2 reducing MR from 4+ at baseline to a 1-2+ in the post procedural setting. He is not recommended for concurrent antiplatelet therapy given the  need for Xarelto for anticoagulation and previous recurrent GI bleeds.  Post procedure echocardiogram has been performed however not yet formally read. He is feeling well today with  no post procedure complications, no SOB, chest pain or groin issues. Labs and vital signs are stable this AM. He has been seen by Dr. Burt Knack and is felt to be stable for discharge today, 03/26/21. He has a follow up appointment with Dr. Percival Spanish, his primary cardiologist, next week however wishes to cancel this and follow with Nell Range, PA-C. He will have his Dr. Percival Spanish appointment rescheduled.    General: Elderly, NAD Neck: Negative for carotid bruits. No JVD Lungs:Clear to ausculation bilaterally. No wheezes, rales, or rhonchi. Breathing is unlabored. Cardiovascular: V-paced with S1 S2. Soft murmur Abdomen: Soft, non-tender, non-distended. No obvious abdominal masses. Groin site with no hematoma or bleeding Extremities: No edema. Radial pulses 2+ bilaterally Neuro: Alert and oriented. No focal deficits. No facial asymmetry. MAE spontaneously. Psych: Responds to questions appropriately with normal affect.    Consultants: None   _____________  Discharge Vitals Blood pressure 129/78, pulse 71, temperature 97.6 F (36.4 C), temperature source Oral, resp. rate 19, height '5\' 10"'$  (1.778 m), weight 94.8 kg, SpO2 95 %.  Filed Weights   03/25/21 0604  Weight: 94.8 kg   Labs & Radiologic Studies    CBC Recent Labs    03/23/21 1429 03/26/21 0026  WBC 8.4 7.6  HGB 12.2* 12.1*  HCT 37.6* 37.0*  MCV 88.3 87.7  PLT 119* [id number]*   Basic Metabolic Panel Recent Labs    03/23/21 1429 03/26/21 0026  NA 137 136  K 3.9 3.8  CL 103 102  CO2 26 27  GLUCOSE 122* 159*  BUN 17 10  CREATININE 0.91 0.72  CALCIUM 9.0 8.5*   Liver Function Tests Recent Labs    03/23/21 1429  AST 40  ALT 25  ALKPHOS 65  BILITOT 1.8*  PROT 6.5  ALBUMIN 3.9   No results for input(s): LIPASE, AMYLASE in the last 72 hours. Cardiac Enzymes No results for input(s): CKTOTAL, CKMB, CKMBINDEX, TROPONINI in the last 72 hours. BNP Invalid input(s): POCBNP D-Dimer No results for input(s): DDIMER in the last  72 hours. Hemoglobin A1C No results for input(s): HGBA1C in the last 72 hours. Fasting Lipid Panel No results for input(s): CHOL, HDL, LDLCALC, TRIG, CHOLHDL, LDLDIRECT in the last 72 hours. Thyroid Function Tests No results for input(s): TSH, T4TOTAL, T3FREE, THYROIDAB in the last 72 hours.  Invalid input(s): FREET3 _____________  DG Chest 2 View  Result Date: 03/24/2021 CLINICAL DATA:  Preoperative respiratory evaluation for mitral valve repair. EXAM: CHEST - 2 VIEW COMPARISON:  03/12/2019 FINDINGS: Low volume film. The cardio pericardial silhouette is enlarged. The lungs are clear without focal pneumonia, edema, pneumothorax or pleural effusion. Nodular density at the right base stable since x-ray from 09/07/2017, likely calcified granuloma. Left pacer device again noted. Bones are demineralized. IMPRESSION: No acute cardiopulmonary findings. Electronically Signed   By: Misty [facility] M.D.   On: 03/24/2021 08:33   CARDIAC CATHETERIZATION  Result Date: 03/25/2021 Successful transcatheter edge-to-edge mitral valve repair with placement of a MitraClip G4 XTW clip positioned A2/P2 and placement of a MitraClip G4 NTW clip positioned on the lateral side of A2/P2, reducing mitral regurgitation from 4+ at baseline to 1-2+ post procedure   CARDIAC CATHETERIZATION  Result Date: 02/24/2021 Formatting of this result is different from the original.   Prox RCA lesion is 30% stenosed. 1.  Mild diffuse  coronary irregularity/plaquing with no significant obstructive disease 2.  Known severe mitral regurgitation with large V waves (28 mmHg) 3.  Normal LV pressure, essentially normal right heart pressures Plan: Continue evaluation for transcatheter edge-to-edge mitral valve repair   ECHO TEE  Result Date: 03/25/2021    TRANSESOPHOGEAL ECHO REPORT   Patient Name:   Austin Jackett. Date of Exam: 03/25/2021 Medical Rec #:  [id number]             Height:       70.0 in Accession #:    [id number]            Weight:        209.0 lb Date of Birth:  June 26, 1932              BSA:          2.127 m Patient Age:    32 years              BP:           131/85 mmHg Patient Gender: M                     HR:           71 bpm. Exam Location:  Inpatient Procedure: 3D Echo, Transesophageal Echo, Cardiac Doppler and Color Doppler Indications:     I34.0 Nonrheumatic mitral (valve) insufficiency  History:         Patient has prior history of Echocardiogram examinations, most                  recent 11/15/2020. Abnormal ECG, Arrythmias:Atrial Fibrillation;                  Risk Factors:Hypertension. Mitraclip procedure.  Sonographer:     Roseanna Rainbow RDCS Referring Phys:  [place] Diagnosing Phys: Sanda Klein MD PROCEDURE: After discussion of the risks and benefits of a TEE, an informed consent was obtained from the patient. The transesophogeal probe was passed without difficulty through the esophogus of the patient. Imaged were obtained with the patient in a supine position. Sedation performed by different physician. The patient was monitored while under deep sedation. The patient developed no complications during the procedure. During the procedure, TEE was used to guide the transseptal puncture, delivery and deployment of the device, evaluation of final result and complications, including with live and post-processed 3D imaging. Preprocedure imaging Normal left ventricular systolic function with prominent pacing-induced paradoxical septal motion, otherwise normal regional wall motion. Moderately dilated right ventricle with reduced systolic function. Severe biatrial dilation Severe mitral insufficiency with eccentric jets due to flail P2 (middle) scallop and severe holosystolic prolapse of P3 (medial) scallop. There is systolic flow reversal of flow in the right upper pulmonary vein. Moderate to severe tricuspid insufficiency with central jet. No pericardial effusion.  Postprocedure imaging Unchanged left and right ventricular function.  After deployment of the first (XT) clip, there is moderate residual mitral regurgitation lateral to the clip and mild regurgitation medial to the clip. A second clip (NTW) was deployed lateral to the first, There was abolition of the lateral regurgitant jet, with a trivial residual jet between the two clips. There was still a mild medial jet of mitral insufficiency. Both clips appeared to be stable, well seated, with excellent tissue bridge. The mean transmitral gradient was 4 mm Hg at 70 bpm (increased from 2 mm Hg). Pressure half time is 168 ms (increased from 79 ms). There  is forward flow in the right upper pulmonary vein. There is a small iatrogenic ASD with left to right shunt at the site of the transseptal puncture. No pericardial effusion.  IMPRESSIONS  1. Left ventricular ejection fraction, by estimation, is 55 to 60%. The left ventricle has normal function. The left ventricle has no regional wall motion abnormalities. Left ventricular diastolic parameters were normal.  2. Right ventricular systolic function is mildly reduced. The right ventricular size is moderately enlarged. There is mildly elevated pulmonary artery systolic pressure.  3. Left atrial size was severely dilated. No left atrial/left atrial appendage thrombus was detected.  4. Right atrial size was severely dilated.  5. At baseline there is a flail middle (P2) scallop due to ruptured chorda tendina and severe prolapse of the medial (P3) scallop. The largest effective orifice area by PISA method is 0.46 cm sq, the regurgitant volume is 75 ml and the regurgitant fraction is 46%. Dorthula Perfect does not account for the moderate medial jet related to P3 prolapse and the overall regurgitation is severe. At baseline, the mean transmitral gradient is 2 mm Hg at 70 bpm, the valve area by planimetry is >7 cm sq, the pressure half time is 79 ms. The mitral valve is myxomatous. Severe mitral valve regurgitation. No evidence of mitral stenosis. There is severe  holosystolic prolapse of multiple scallops of the posterior leaflet of the mitral valve. The mean mitral valve gradient  is 4.0 mmHg with average heart rate of 70 bpm.  6. The tricuspid valve is myxomatous. Tricuspid valve regurgitation is moderate to severe.  7. The aortic valve is tricuspid. Aortic valve regurgitation is mild. Mild aortic valve sclerosis is present, with no evidence of aortic valve stenosis.  8. There is mild (Grade II) plaque. FINDINGS  Left Ventricle: Left ventricular ejection fraction, by estimation, is 55 to 60%. The left ventricle has normal function. The left ventricle has no regional wall motion abnormalities. The left ventricular internal cavity size was normal in size. Abnormal  (paradoxical) septal motion, consistent with RV pacemaker. Left ventricular diastolic function could not be evaluated due to atrial fibrillation. Left ventricular diastolic parameters were normal. Right Ventricle: The right ventricular size is moderately enlarged. No increase in right ventricular wall thickness. Right ventricular systolic function is mildly reduced. There is mildly elevated pulmonary artery systolic pressure. The tricuspid regurgitant velocity is 2.30 m/s, and with an assumed right atrial pressure of 10 mmHg, the estimated right ventricular systolic pressure is [id number] mmHg. Left Atrium: Left atrial size was severely dilated. No left atrial/left atrial appendage thrombus was detected. Right Atrium: Right atrial size was severely dilated. Pericardium: There is no evidence of pericardial effusion. Mitral Valve: At baseline there is a flail middle (P2) scallop due to ruptured chorda tendina and severe prolapse of the medial (P3) scallop. The largest effective orifice area by PISA method is 0.46 cm sq, the regurgitant volume is 75 ml and the regurgitant fraction is 46%. Dorthula Perfect does not account for the moderate medial jet related to P3 prolapse and the overall regurgitation is severe. At baseline, the mean  transmitral gradient is 2 mm Hg at 70 bpm, the valve area by planimetry is >7 cm sq, the  pressure half time is 79 ms. The mitral valve is myxomatous. There is severe holosystolic prolapse of multiple scallops of the posterior leaflet of the mitral valve. There is mild thickening of the mitral valve leaflet(s). Severe mitral valve regurgitation, with eccentric anteriorly directed jet. No evidence of mitral valve  stenosis. MV peak gradient, 7.1 mmHg. The mean mitral valve gradient is 4.0 mmHg with average heart rate of 70 bpm. Tricuspid Valve: The tricuspid valve is myxomatous. Tricuspid valve regurgitation is moderate to severe. Aortic Valve: The aortic valve is tricuspid. Aortic valve regurgitation is mild. Mild aortic valve sclerosis is present, with no evidence of aortic valve stenosis. Pulmonic Valve: The pulmonic valve was normal in structure. Pulmonic valve regurgitation is trivial. Aorta: The aortic root, ascending aorta, aortic arch and descending aorta are all structurally normal, with no evidence of dilitation or obstruction. There is mild (Grade II) plaque. Venous: A pattern of systolic flow reversal, suggestive of severe mitral regurgitation is recorded from the right upper pulmonary vein. Systolic flow is blunted, but not reversed in the left pulmonary veins. IAS/Shunts: No atrial level shunt detected by color flow Doppler. Additional Comments: A device lead is visualized in the right ventricle and right atrium.  LEFT VENTRICLE PLAX 2D LVOT diam:     2.10 cm LV SV:         87 LV SV Index:   41 LVOT Area:     3.46 cm  AORTIC VALVE LVOT Vmax:   128.00 cm/s LVOT Vmean:  84.800 cm/s LVOT VTI:    0.251 m MITRAL VALVE                   TRICUSPID VALVE MV Area VTI:  2.77 cm         TR Peak grad:   21.2 mmHg MV Peak grad: 7.1 mmHg         TR Vmax:        230.00 cm/s MV Mean grad: 4.0 mmHg MV Vmax:      1.33 m/s         SHUNTS MV Vmean:     85.8 cm/s        Systemic VTI:  0.25 m MR Peak grad:      110.2 mmHg   Systemic Diam: 2.10 cm MR Mean grad:      53.0 mmHg MR Vmax:           525.00 cm/s MR Vmean:          318.0 cm/s MR Vena Contracta: 0.50 cm MR PISA:           6.28 cm MR PISA Eff ROA:   46 mm MR PISA Radius:    1.00 cm Dani Gobble Croitoru MD Electronically signed by Sanda Klein MD Signature Date/Time: 03/25/2021/11:20:16 AM    Final    CUP PACEART REMOTE DEVICE CHECK  Result Date: 03/24/2021 Scheduled remote reviewed. Normal device function.  Next remote 91 days. LR  Disposition   Pt is being discharged home today in good condition.  Follow-up Plans & Appointments    Follow-up Information     Eileen Stanford, PA-C. Go on 04/07/2021.   Specialties: Cardiology, Radiology Why: @ 3pm, please arrive at least 10 minutes early Contact information: [place] [address] [phone]                Discharge Medications   Allergies as of 03/26/2021   No Known Allergies      Medication List     STOP taking these medications    amoxicillin 500 MG capsule Commonly known as: AMOXIL       TAKE these medications    acetaminophen 500 MG tablet Commonly known as: TYLENOL Take 500 mg by mouth every 6 (six) hours  as needed for mild pain, fever or headache.   albuterol 108 (90 Base) MCG/ACT inhaler Commonly known as: VENTOLIN HFA INHALE 2 PUFFS EVERY 6 HOURS AS NEEDED FOR WHEEZING OR SHORTNESS OF BREATH What changed:  how much to take how to take this when to take this reasons to take this additional instructions   amLODipine 10 MG tablet Commonly known as: NORVASC TAKE 1 TABLET EVERY DAY   carvedilol 25 MG tablet Commonly known as: COREG Take 1 tablet (25 mg total) by mouth 2 (two) times daily.   chlorhexidine 0.12 % solution Commonly known as: PERIDEX 15 mLs by Mouth Rinse route 2 (two) times daily for 14 days.   ferrous sulfate 325 (65 FE) MG tablet Take 325 mg by mouth every other day.   fluticasone 50 MCG/ACT nasal  spray Commonly known as: FLONASE USE 1 SPRAY IN EACH NOSTRIL EVERY DAY What changed: See the new instructions.   furosemide 40 MG tablet Commonly known as: LASIX TAKE 2 TABLETS ('80MG'$ ) EVERY MORNING AND 1 TABLET ('40MG'$ ) EVERY EVENING What changed: See the new instructions.   guaiFENesin-dextromethorphan 100-10 MG/5ML syrup Commonly known as: ROBITUSSIN DM Take 5 mLs by mouth every 4 (four) hours as needed for cough.   losartan 50 MG tablet Commonly known as: COZAAR TAKE 1 TABLET EVERY DAY   multivitamin tablet Take 1 tablet by mouth daily.   multivitamin-lutein Caps capsule Take 1 capsule by mouth daily.   polyvinyl alcohol 1.4 % ophthalmic solution Commonly known as: LIQUIFILM TEARS Place 1 drop into both eyes as needed for dry eyes.   potassium chloride 10 MEQ tablet Commonly known as: KLOR-CON TAKE 1 TABLET EVERY DAY   vitamin B-12 1000 MCG tablet Commonly known as: CYANOCOBALAMIN TAKE 1 TABLET EVERY DAY   Xarelto 20 MG Tabs tablet Generic drug: rivaroxaban TAKE 1 TABLET EVERY DAY WITH SUPPER What changed: See the new instructions.       Outstanding Labs/Studies   None   Duration of Discharge Encounter   Greater than 30 minutes including physician time.  Signed, Kathyrn Drown, NP 03/26/2021, 9:58 AM  Patient seen, examined. Available data reviewed. Agree with findings, assessment, and plan as outlined by Kathyrn Drown, NP.  On my exam this morning, the patient is alert, oriented, in no distress.  He is sitting up in a chair at the bedside.  JVP is normal, HEENT is normal, lungs are clear bilaterally, heart is regular rate and rhythm with a soft holosystolic murmur at the apex, abdomen is soft and nontender, right groin site is clear with no hematoma or ecchymosis, extremities have no edema.  Telemetry is reviewed and shows a ventricular paced rhythm.  Postoperative day #1 echo is personally reviewed.  There is mild eccentric residual MR.  Overall the MitraClip  result looks very good with excellent reduction in degree of mitral regurgitation.  The patient is medically stable for hospital discharge.  Postprocedural instructions were reviewed with him.  Follow-up has been scheduled as above.  He will remain on rivaroxaban for anticoagulation.  Other instructions include no concomitant antiplatelet therapy and he should follow SBE prophylaxis for a period of at least 6 months.  Sherren Mocha, M.D. 03/26/2021 1:10 PM

## 2021-03-26 NOTE — Plan of Care (Signed)
  Problem: Education: Goal: Knowledge of General Education information will improve Description: Including pain rating scale, medication(s)/side effects and non-pharmacologic comfort measures Outcome: Adequate for Discharge   Problem: Health Behavior/Discharge Planning: Goal: Ability to manage health-related needs will improve Outcome: Adequate for Discharge   Problem: Clinical Measurements: Goal: Ability to maintain clinical measurements within normal limits will improve Outcome: Adequate for Discharge Goal: Will remain free from infection Outcome: Adequate for Discharge Goal: Diagnostic test results will improve Outcome: Adequate for Discharge Goal: Respiratory complications will improve Outcome: Adequate for Discharge Goal: Cardiovascular complication will be avoided Outcome: Adequate for Discharge   Problem: Nutrition: Goal: Adequate nutrition will be maintained Outcome: Adequate for Discharge   Problem: Activity: Goal: Risk for activity intolerance will decrease Outcome: Adequate for Discharge   Problem: Coping: Goal: Level of anxiety will decrease Outcome: Adequate for Discharge   Problem: Elimination: Goal: Will not experience complications related to bowel motility Outcome: Adequate for Discharge Goal: Will not experience complications related to urinary retention Outcome: Adequate for Discharge

## 2021-03-26 NOTE — Progress Notes (Signed)
CARDIAC REHAB PHASE I   PRE:  Rate/Rhythm: 78 paced    BP: sitting 129/78    SaO2: 95 RA  MODE:  Ambulation: 220 ft   POST:  Rate/Rhythm: 85 paced    BP: sitting 140/76     SaO2: 96 RA  Pt ambulated with RW, assist x2 with gait belt.  Pt steady and did not become SOB, he felt well. He struggled with steering RW due to blindness but he does not normally use RW at home but uses his "walking stick". Daughter sts he has not fallen and does well. She sts his breathing is significantly better today. Reminders given for groin restrictions.  Merkel, ACSM 03/26/2021 9:54 AM

## 2021-03-26 NOTE — Progress Notes (Signed)
*  PRELIMINARY RESULTS* Echocardiogram 2D Echocardiogram has been performed.  Luisa Hart RDCS 03/26/2021, 11:38 AM

## 2021-03-29 ENCOUNTER — Other Ambulatory Visit: Payer: Self-pay | Admitting: Physician Assistant

## 2021-03-29 ENCOUNTER — Ambulatory Visit: Payer: Medicare HMO | Admitting: Cardiology

## 2021-03-29 ENCOUNTER — Telehealth: Payer: Self-pay | Admitting: Physician Assistant

## 2021-03-29 MED ORDER — FUROSEMIDE 40 MG PO TABS: 40.0000 mg | ORAL_TABLET | Freq: Two times a day (BID) | ORAL | 1 refills | Status: DC

## 2021-03-29 NOTE — Telephone Encounter (Signed)
  [place] VALVE TEAM   Patient contacted regarding discharge from [facility] on 9/9  Patient understands to follow up with provider Nell Range on 9/21 at [facility].  Patient understands discharge instructions? yes Patient understands medications and regimen? yes Patient understands to bring all medications to this visit? yes  Angelena Form PA-C  [facility]

## 2021-03-30 ENCOUNTER — Telehealth: Payer: Self-pay | Admitting: *Deleted

## 2021-03-30 NOTE — Telephone Encounter (Signed)
Transition Care Management Follow-up Telephone Call Date of discharge and from where: 03/26/2021  [facility] How have you been since you were released from the hospital? "Doing pretty well" Any questions or concerns? No  Items Reviewed: Did the pt receive and understand the discharge instructions provided? Yes  Medications obtained and verified? Yes  Other? N/a Any new allergies since your discharge? No  Dietary orders reviewed? Yes Do you have support at home? Yes - granddaughter  Home Care and Equipment/Supplies: Were home health services ordered? no If so, what is the name of the agency? N/a Has the agency set up a time to come to the patient's home? not applicable Were any new equipment or medical supplies ordered?  No What is the name of the medical supply agency? N/a Were you able to get the supplies/equipment? not applicable Do you have any questions related to the use of the equipment or supplies? N/a  Functional Questionnaire: (I = Independent and D = Dependent) ADLs: I  Bathing/Dressing- I  Meal Prep- I  Eating- I  Maintaining continence- I  Transferring/Ambulation- I  Managing Meds- I  Follow up appointments reviewed:  PCP Hospital f/u appt confirmed? No- pt does not have appointment (reports he will schedule) [facility] f/u appt confirmed? Yes  Scheduled to see Angelena Form on 04/07/21 @ 3 pm Are transportation arrangements needed? No  If their condition worsens, is the pt aware to call PCP or go to the Emergency Dept.? Yes Was the patient provided with contact information for the PCP's office or ED? Yes Was to pt encouraged to call back with questions or concerns? Yes  Jacqlyn Larsen [facility], BSN RN Case Manager [phone]

## 2021-04-01 NOTE — Progress Notes (Signed)
Remote pacemaker transmission.   

## 2021-04-05 ENCOUNTER — Ambulatory Visit (INDEPENDENT_AMBULATORY_CARE_PROVIDER_SITE_OTHER): Payer: Medicare HMO | Admitting: Family Medicine

## 2021-04-05 ENCOUNTER — Encounter: Payer: Self-pay | Admitting: Family Medicine

## 2021-04-05 ENCOUNTER — Other Ambulatory Visit: Payer: Self-pay

## 2021-04-05 VITALS — BP 126/68 | HR 70 | Ht 70.0 in | Wt 211.8 lb

## 2021-04-05 DIAGNOSIS — Z23 Encounter for immunization: Secondary | ICD-10-CM

## 2021-04-05 DIAGNOSIS — I10 Essential (primary) hypertension: Secondary | ICD-10-CM | POA: Diagnosis not present

## 2021-04-05 DIAGNOSIS — Z9889 Other specified postprocedural states: Secondary | ICD-10-CM | POA: Diagnosis not present

## 2021-04-05 MED ORDER — AMLODIPINE BESYLATE 10 MG PO TABS: 10.0000 mg | ORAL_TABLET | Freq: Every day | ORAL | 3 refills | Status: DC

## 2021-04-05 NOTE — Progress Notes (Signed)
Subjective:    Patient ID: Austin Gray., male    DOB: October 14, 1931, 87 y.o.   MRN: [id number]  Hypertension   Patient is a very pleasant 87 year-old Caucasian male here today for hospital follow-up.  He has a history of paroxysmal atrial fibrillation.  He also has a history of complete AV block requiring a pacemaker due to symptomatic bradycardia.  He recently underwent transcatheter repair of the mitral valve for severe mitral valve regurgitation.  He was admitted the hospital on September 8 and discharged on September 9.  He did exceptionally well with no postoperative complications.  I examined today the catheter site and there is no evidence of any significant hematoma or cellulitis or infection.  There is no tenderness at the site in his right groin.  He denies any dysuria or urgency or frequency.  He denies any chest pain or shortness of breath or dyspnea on exertion.  His lungs are completely clear to auscultation bilaterally today.  There is no edema in his legs.  His weight is down almost 9 pounds since I last saw him.  He is taking Lasix 40 mg twice daily however he appears to be euvolemic or even slightly dehydrated today at that dose.     Past Medical History:  Diagnosis Date   Anxiety    Atrial fibrillation ([place])    Chronic   Cataracts, bilateral    to have left eye cataract surgery on 03/21/17    CHF (congestive heart failure), NYHA class II, chronic, diastolic (HCC)    Colon polyps    last colonoscopy 2015 (tubular adenoma)   Depression    GERD (gastroesophageal reflux disease)    H/O: GI bleed    Recurrent   Hemorrhoids    History of BPH    History of kidney stones    hx of x 1  passed stones   Hypertension    Pernicious anemia    Presence of permanent cardiac pacemaker    PVC (premature ventricular contraction)    S/P mitral valve clip implantation 03/25/2021   Successful transcatheter edge-to-edge mitral valve repair with placement of a MitraClip G4 XTW clip  positioned A2/P2 and placement of a MitraClip G4 NTW clip positioned on the lateral side of A2/P2, reducing mitral regurgitation from 4+ at baseline to 1-2+ post procedure   Severe mitral regurgitation    Vitamin D deficiency    Past Surgical History:  Procedure Laterality Date   COLONOSCOPY WITH PROPOFOL N/A 03/17/2017   Procedure: COLONOSCOPY WITH PROPOFOL;  Surgeon: Carol Ada, MD;  Location: [facility];  Service: Endoscopy;  Laterality: N/A;   [place]; ~ 2002   left; right   MITRAL VALVE REPAIR N/A 03/25/2021   Procedure: MITRAL VALVE REPAIR;  Surgeon: Sherren Mocha, MD;  Location: [place] [facility];  Service: Cardiovascular;  Laterality: N/A;   MULTIPLE EXTRACTIONS WITH ALVEOLOPLASTY N/A 03/18/2021   Procedure: MULTIPLE EXTRACTION WITH ALVEOLOPLASTY;  Surgeon: Charlaine Dalton, DMD;  Location: [place];  Service: Dentistry;  Laterality: N/A;   PACEMAKER IMPLANT N/A 03/11/2019   Procedure: PACEMAKER IMPLANT;  Surgeon: Constance Haw, MD;  Location: [place] [facility];  Service: Cardiovascular;  Laterality: N/A;   RIGHT/LEFT HEART CATH AND CORONARY ANGIOGRAPHY N/A 02/24/2021   Procedure: RIGHT/LEFT HEART CATH AND CORONARY ANGIOGRAPHY;  Surgeon: Sherren Mocha, MD;  Location: [place] [facility];  Service: Cardiovascular;  Laterality: N/A;   TEE WITHOUT CARDIOVERSION  N/A 12/23/2020   Procedure: TRANSESOPHAGEAL ECHOCARDIOGRAM (TEE);  Surgeon: Freada Bergeron, MD;  Location: [facility] END[facility]COPY;  Service: Cardiovascular;  Laterality: N/A;   TEE WITHOUT CARDIOVERSION N/A 03/25/2021   Procedure: TRANSESOPHAGEAL ECHOCARDIOGRAM (TEE);  Surgeon: Sherren Mocha, MD;  Location: [place] [facility];  Service: Cardiovascular;  Laterality: N/A;   TEMPORARY PACEMAKER N/A 03/10/2019   Procedure: TEMPORARY PACEMAKER;  Surgeon: Martinique, Peter M, MD;  Location: [place] [facility];  Service: Cardiovascular;  Laterality: N/A;   Current Outpatient Medications on File  Prior to Visit  Medication Sig Dispense Refill   acetaminophen (TYLENOL) 500 MG tablet Take 500 mg by mouth every 6 (six) hours as needed for mild pain, fever or headache.     albuterol (VENTOLIN HFA) 108 (90 Base) MCG/ACT inhaler INHALE 2 PUFFS EVERY 6 HOURS AS NEEDED FOR WHEEZING OR SHORTNESS OF BREATH (Patient taking differently: Inhale 2 puffs into the lungs every 6 (six) hours as needed for wheezing.) 18 g 3   ferrous sulfate 325 (65 FE) MG tablet Take 325 mg by mouth every other day.      fluticasone (FLONASE) 50 MCG/ACT nasal spray USE 1 SPRAY IN EACH NOSTRIL EVERY DAY (Patient taking differently: Place 1 spray into both nostrils daily as needed for allergies.) 32 g 1   furosemide (LASIX) 40 MG tablet Take 1 tablet (40 mg total) by mouth 2 (two) times daily. 270 tablet 1   guaiFENesin-dextromethorphan (ROBITUSSIN DM) 100-10 MG/5ML syrup Take 5 mLs by mouth every 4 (four) hours as needed for cough.     losartan (COZAAR) 50 MG tablet TAKE 1 TABLET EVERY DAY (Patient taking differently: Take 50 mg by mouth daily.) 90 tablet 3   Multiple Vitamin (MULTIVITAMIN) tablet Take 1 tablet by mouth daily.     multivitamin-lutein (OCUVITE-LUTEIN) CAPS Take 1 capsule by mouth daily.     polyvinyl alcohol (LIQUIFILM TEARS) 1.4 % ophthalmic solution Place 1 drop into both eyes as needed for dry eyes.     potassium chloride (KLOR-CON) 10 MEQ tablet TAKE 1 TABLET EVERY DAY (Patient taking differently: Take 10 mEq by mouth daily.) 90 tablet 3   vitamin B-12 (CYANOCOBALAMIN) 1000 MCG tablet TAKE 1 TABLET EVERY DAY (Patient taking differently: Take 1,000 mcg by mouth daily.) 90 tablet 3   XARELTO 20 MG TABS tablet TAKE 1 TABLET EVERY DAY WITH SUPPER (Patient taking differently: Take 20 mg by mouth daily with supper.) 90 tablet 3   carvedilol (COREG) 25 MG tablet Take 1 tablet (25 mg total) by mouth 2 (two) times daily. 180 tablet 1   No current facility-administered medications on file prior to visit.   No Known  Allergies Social History   Socioeconomic History   Marital status: Widowed    Spouse name: Not on file   Number of children: 1   Years of education: Not on file   Highest education level: Not on file  Occupational History   Occupation: Retired  Tobacco Use   Smoking status: Never   Smokeless tobacco: Former    Types: Snuff   Tobacco comments:    03/17/21 "still use snuff daily"  Vaping Use   Vaping Use: Never used  Substance and Sexual Activity   Alcohol use: No   Drug use: No   Sexual activity: Never    Comment: widower, lives with granddaughter and great grandchildren.  Other Topics Concern   Not on file  Social History Narrative   Lives with grand daughter.  Epworth Sleepiness Scale = 7 (as of 09/21/2015)   Social Determinants of Health   Financial Resource Strain: Not on file  Food Insecurity: Not on file  Transportation Needs: Not on file  Physical Activity: Not on file  Stress: Not on file  Social Connections: Not on file  Intimate Partner Violence: Not on file     Review of Systems  All other systems reviewed and are negative.     Objective:   Physical Exam Vitals reviewed.  Constitutional:      General: He is not in acute distress.    Appearance: Normal appearance. He is normal weight. He is not ill-appearing, toxic-appearing or diaphoretic.  Cardiovascular:     Rate and Rhythm: Normal rate and regular rhythm.     Heart sounds: Murmur heard.  Systolic murmur is present with a grade of 3/6.  Pulmonary:     Effort: Pulmonary effort is normal.     Breath sounds: Normal breath sounds. No wheezing, rhonchi or rales.  Abdominal:     General: Abdomen is flat. Bowel sounds are normal. There is no distension.     Palpations: Abdomen is soft.     Tenderness: There is no abdominal tenderness. There is no guarding.  Musculoskeletal:     Right lower leg: No edema.     Left lower leg: No edema.  Skin:    Coloration: Skin is not jaundiced.   Neurological:     Mental Status: He is alert.          Assessment & Plan:  Need for influenza vaccination - Plan: Flu Vaccine QUAD High Dose(Fluad)  H/O mitral valve repair - Plan: BASIC METABOLIC PANEL WITH GFR, CBC with Differential/Platelet Patient is doing extremely well postoperatively.  He tolerated the procedure well.  At this point I will decrease his dose of Lasix to 40 mg once daily and asked him to monitor his weight and for any swelling.  His blood pressure today is outstanding and he seems to have no complications from his recent hospitalization.  There is no evidence of a DVT or pulmonary embolism or pneumonia or UTI or catheter site infection.  I will check a baseline CBC and BMP.  I congratulated the patient and he received his flu shot.

## 2021-04-06 LAB — BASIC METABOLIC PANEL WITH GFR
BUN: 12 mg/dL (ref 7–25)
CO2: 28 mmol/L (ref 20–32)
Calcium: 9.1 mg/dL (ref 8.6–10.3)
Chloride: 104 mmol/L (ref 98–110)
Creat: 0.98 mg/dL (ref 0.70–1.22)
Glucose, Bld: 125 mg/dL — ABNORMAL HIGH (ref 65–99)
Potassium: 4.2 mmol/L (ref 3.5–5.3)
Sodium: 139 mmol/L (ref 135–146)
eGFR: 75 mL/min/{1.73_m2} (ref >=60)

## 2021-04-06 LAB — CBC WITH DIFFERENTIAL/PLATELET
Absolute Monocytes: 584 cells/uL (ref 200–950)
Basophils Absolute: 51 cells/uL (ref 0–200)
Basophils Relative: 0.7 %
Eosinophils Absolute: 139 cells/uL (ref 15–500)
Eosinophils Relative: 1.9 %
HCT: 37.3 % — ABNORMAL LOW (ref 38.5–50.0)
Hemoglobin: 11.9 g/dL — ABNORMAL LOW (ref 13.2–17.1)
Lymphs Abs: 1387 cells/uL (ref 850–3900)
MCH: 28.5 pg (ref 27.0–33.0)
MCHC: 31.9 g/dL — ABNORMAL LOW (ref 32.0–36.0)
MCV: 89.2 fL (ref 80.0–100.0)
MPV: 11.3 fL (ref 7.5–12.5)
Monocytes Relative: 8 %
Neutro Abs: 5139 cells/uL (ref 1500–7800)
Neutrophils Relative %: 70.4 %
Platelets: 138 10*3/uL — ABNORMAL LOW (ref 140–400)
RBC: 4.18 10*6/uL — ABNORMAL LOW (ref 4.20–5.80)
RDW: 13.8 % (ref 11.0–15.0)
Total Lymphocyte: 19 %
WBC: 7.3 10*3/uL (ref 3.8–10.8)

## 2021-04-06 NOTE — Progress Notes (Deleted)
HEART AND [place]                                     Cardiology Office Note:    Date:  04/06/2021   ID:  Austin Gray., DOB 1931-12-17, MRN [id number]  PCP:  [name], MD  [facility] Cardiologist:  Minus Breeding, MD/ Dr. Sherren Mocha, MD   Referring MD: [name], MD   No chief complaint on file. ***  History of Present Illness:    Austin Gray. is a 87 y.o. male with a hx of chronic persistent atrial fibrillation on Xarelto, CHB s/p PPM, HTN, chronic diastolic heart failure and severe mitral regurgitation who presented to [facility] 03/25/21 for planned transcatheter edge-to-edge mitral valve repair with MitraClip performed by Dr. Burt Knack.   He underwent an echocardiogram 12/07/20 which showed EF 55%, severe biatrial enlargement and severe mitral regurgitation. The mitral valve is myxomatous. The jet is eccentric and anteriorly directed. There is splay artifact suggestive of at least moderate regurgitation. The MR is poorly quantitated. Most impresive  images are PLAX. Suspect it is just very eccentric. There is likely a flail PMVL chord to explain these findings, and abrupt change from prior echo. Subsequent TEE 12/23/20 showed EF 55%, mild RV dysfunction/moderate enlargement. RV pacing wire is present. RVSP is 1mmHg+RAP. Severe biatrial enlargement. Severe TR (suspected to be functional due to severe annular dilation). 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 EROA 0.4cm2 with RVol 35mL by PISA. No evidence of mitral stenosis.    He was seen by Dr. Burt Knack on 12/30/20 for structural heart consultation and discussion of potential Mitral Valve TEER. The patient's transesophageal echo demonstrated findings consistent with severe mitral regurgitation secondary to degenerative changes of ruptured  primary chordae tendonae and flail P2 segment of the mitral valve with eccentric MR. He did not appear to be particularly limited at that time with only mild shortness of breath. He initially deferred procedure but then later changed his mind. L/RHC on 02/24/21 showed mild diffuse CAD and confirmed severe MR with large V waves (28 mm hg).   He underwent successful transcatheter edge-to-edge mitral valve repair with placement of a MitraClip G4 XTW clip in the A2/P2 and placement of a MitraClip G4 NTW positioned in the lateral side of A2/P2 reducing MR from 4+ at baseline to a 1-2+ in the post procedural setting. He is not recommended for concurrent antiplatelet therapy given the need for Xarelto for anticoagulation and previous recurrent GI bleeds. He will need SBE prophylaxis for 6 months post implant.     Post procedure echocardiogram performed prior to hospital discharge which showed mild eccentric residual MR however overall the MitraClip result showed excellent reduction in the degree of mitral regurgitation.   ######KCCQ??? NYHA???  Recommend to resume Rivaroxaban, at currently prescribed dose and frequency on 03/25/2021. Concurrent antiplatelet therapy not recommended  1. Non-rheumatic mitral valve regurgitation: -Underwent TEER with placement of a MitraClip G4 XTW clip in the A2/P2 and placement of a MitraClip G4 NTW positioned in the lateral side of A2/P2 reducing MR from 4+ at baseline to a 1-2+ in the post procedural setting. He is not recommended for concurrent antiplatelet therapy given the need for Xarelto for anticoagulation and previous recurrent GI bleeds.  He will need SBE prophylaxis for 6 months post implant.   -NYHA class I/II?  -SBE with 2g Amoxicillin PO 1 hour prior to dental work>>reviewed indication with patient today     2. Persistent atrial fibrillation: -CHA2DS2VASc score 5 (age,CHF, HTN) -Continue Xarelto -Denies bleeding in stool or urine -CBC -No plans for concurrent  antiplatelet therapy given need for AC  3. HTN: -Stable, no change in medical therapy   4. S/p PPM: -Followed by Dr. Curt Bears -Last interrogation 03/24/21 with normal battery life and lead parameters   There are two well-seated MitraClips in the A2P2 (middle scallop)  area. There is a residual mitral insufficiency jet, medial to the clips,  mild in severity. The mitral valve has been repaired/replaced. Mild mitral  valve regurgitation. Mild mitral  stenosis. The mean mitral valve gradient is 4.7 mmHg at 70 bpm. Pressure  half time is 121 ms.   6. Tricuspid valve regurgitation is moderate to severe.       Past Medical History:  Diagnosis Date   Anxiety    Atrial fibrillation ([place])    Chronic   Cataracts, bilateral    to have left eye cataract surgery on 03/21/17    CHF (congestive heart failure), NYHA class II, chronic, diastolic (HCC)    Colon polyps    last colonoscopy 2015 (tubular adenoma)   Depression    GERD (gastroesophageal reflux disease)    H/O: GI bleed    Recurrent   Hemorrhoids    History of BPH    History of kidney stones    hx of x 1  passed stones   Hypertension    Pernicious anemia    Presence of permanent cardiac pacemaker    PVC (premature ventricular contraction)    S/P mitral valve clip implantation 03/25/2021   Successful transcatheter edge-to-edge mitral valve repair with placement of a MitraClip G4 XTW clip positioned A2/P2 and placement of a MitraClip G4 NTW clip positioned on the lateral side of A2/P2, reducing mitral regurgitation from 4+ at baseline to 1-2+ post procedure   Severe mitral regurgitation    Vitamin D deficiency     Past Surgical History:  Procedure Laterality Date   COLONOSCOPY WITH PROPOFOL N/A 03/17/2017   Procedure: COLONOSCOPY WITH PROPOFOL;  Surgeon: Carol Ada, MD;  Location: [facility];  Service: Endoscopy;  Laterality: N/A;   [place]; ~ 2002   left; right   MITRAL VALVE  REPAIR N/A 03/25/2021   Procedure: MITRAL VALVE REPAIR;  Surgeon: Sherren Mocha, MD;  Location: [place] [facility];  Service: Cardiovascular;  Laterality: N/A;   MULTIPLE EXTRACTIONS WITH ALVEOLOPLASTY N/A 03/18/2021   Procedure: MULTIPLE EXTRACTION WITH ALVEOLOPLASTY;  Surgeon: Charlaine Dalton, DMD;  Location: [place];  Service: Dentistry;  Laterality: N/A;   PACEMAKER IMPLANT N/A 03/11/2019   Procedure: PACEMAKER IMPLANT;  Surgeon: Constance Haw, MD;  Location: [place] [facility];  Service: Cardiovascular;  Laterality: N/A;   RIGHT/LEFT HEART CATH AND CORONARY ANGIOGRAPHY N/A 02/24/2021   Procedure: RIGHT/LEFT HEART CATH AND CORONARY ANGIOGRAPHY;  Surgeon: Sherren Mocha, MD;  Location: [place] [facility];  Service: Cardiovascular;  Laterality: N/A;   TEE WITHOUT CARDIOVERSION N/A 12/23/2020   Procedure: TRANSESOPHAGEAL ECHOCARDIOGRAM (TEE);  Surgeon: Freada Bergeron, MD;  Location: [facility] ENDOSCOPY;  Service: Cardiovascular;  Laterality: N/A;   TEE WITHOUT CARDIOVERSION N/A 03/25/2021   Procedure: TRANSESOPHAGEAL ECHOCARDIOGRAM (TEE);  Surgeon: Sherren Mocha, MD;  Location: [place] [facility]  [facility];  Service: Cardiovascular;  Laterality: N/A;   TEMPORARY PACEMAKER N/A 03/10/2019   Procedure: TEMPORARY PACEMAKER;  Surgeon: Martinique, Peter M, MD;  Location: [place] [facility];  Service: Cardiovascular;  Laterality: N/A;    Current Medications: No outpatient medications have been marked as taking for the 04/07/21 encounter (Appointment) with Eileen Stanford, PA-C.     Allergies:   Patient has no known allergies.   Social History   Socioeconomic History   Marital status: Widowed    Spouse name: Not on file   Number of children: 1   Years of education: Not on file   Highest education level: Not on file  Occupational History   Occupation: Retired  Tobacco Use   Smoking status: Never   Smokeless tobacco: Former    Types: Snuff   Tobacco comments:    03/17/21 "still use snuff  daily"  Vaping Use   Vaping Use: Never used  Substance and Sexual Activity   Alcohol use: No   Drug use: No   Sexual activity: Never    Comment: widower, lives with granddaughter and great grandchildren.  Other Topics Concern   Not on file  Social History Narrative   Lives with grand daughter.         Epworth Sleepiness Scale = 7 (as of 09/21/2015)   Social Determinants of Health   Financial Resource Strain: Not on file  Food Insecurity: Not on file  Transportation Needs: Not on file  Physical Activity: Not on file  Stress: Not on file  Social Connections: Not on file     Family History: The patient's ***family history includes Cancer in his mother; Stroke (age of onset: 6) in his father.  ROS:   Please see the history of present illness.    All other systems reviewed and are negative.  EKGs/Labs/Other Studies Reviewed:    The following studies were reviewed today:  Mitral Valve Repair (TEER) 03/25/21:   Successful transcatheter edge-to-edge mitral valve repair with placement of a MitraClip G4 XTW clip positioned A2/P2 and placement of a MitraClip G4 NTW clip positioned on the lateral side of A2/P2, reducing mitral regurgitation from 4+ at baseline to 1-2+ post procedure   Recommend to resume Rivaroxaban, at currently prescribed dose and frequency on 03/25/2021. Concurrent antiplatelet therapy not recommended _____________   Echo 03/26/21:    1. Left ventricular ejection fraction, by estimation, is 50 to 55%. The  left ventricle has low normal function. The left ventricle demonstrates  global hypokinesis. Left ventricular diastolic function could not be  evaluated.   2. Right ventricular systolic function is mildly reduced. The right  ventricular size is mildly enlarged. There is moderately elevated  pulmonary artery systolic pressure.   3. Left atrial size was severely dilated.   4. Right atrial size was severely dilated.   5. There are two well-seated MitraClips in  the A2P2 (middle scallop)  area. There is a residual mitral insufficiency jet, medial to the clips,  mild in severity. The mitral valve has been repaired/replaced. Mild mitral  valve regurgitation. Mild mitral  stenosis. The mean mitral valve gradient is 4.7 mmHg at 70 bpm. Pressure  half time is 121 ms.   6. Tricuspid valve regurgitation is moderate to severe.   7. The aortic valve is tricuspid. There is mild calcification of the  aortic valve. There is mild thickening of the aortic valve. Aortic valve  regurgitation is mild. No aortic stenosis is present.   8. The inferior vena  cava is dilated in size with <50% respiratory  variability, suggesting right atrial pressure of 15 mmHg.    Comparison(s): Mildly elevated mitral valve gradients and mild residual  mitral insufficiency is unchanged from yesterday's post=procedure  findings. There is evidence of increased right ventricular systolic  pressure/pulmonary artery pressure and increased   right atrial pressure.   R/LHC 02/24/21:    Prox RCA lesion is 30% stenosed.   1.  Mild diffuse coronary irregularity/plaquing with no significant obstructive disease 2.  Known severe mitral regurgitation with large V waves (28 mmHg) 3.  Normal LV pressure, essentially normal right heart pressures  Plan: Continue evaluation for transcatheter edge-to-edge mitral valve repair    EKG:  EKG is *** ordered today.  The ekg ordered today demonstrates ***  Recent Labs: 03/23/2021: ALT 25 04/05/2021: BUN 12; Creat 0.98; Hemoglobin 11.9; Platelets 138; Potassium 4.2; Sodium 139  Recent Lipid Panel    Component Value Date/Time   CHOL 179 12/01/2020 0808   TRIG 58 12/01/2020 0808   HDL 62 12/01/2020 0808   CHOLHDL 2.9 12/01/2020 0808   VLDL 11 04/08/2016 0823   LDLCALC 103 (H) 12/01/2020 0808     Risk Assessment/Calculations:   {Does this patient have ATRIAL FIBRILLATION?[phone]}   Physical Exam:    VS:  There were no vitals taken for  this visit.    Wt Readings from Last 3 Encounters:  04/05/21 211 lb 12.8 oz (96.1 kg)  03/25/21 209 lb (94.8 kg)  03/23/21 209 lb 6.4 oz (95 kg)     GEN: *** Well nourished, well developed in no acute distress HEENT: Normal NECK: No JVD; No carotid bruits LYMPHATICS: No lymphadenopathy CARDIAC: ***RRR, no murmurs, rubs, gallops RESPIRATORY:  Clear to auscultation without rales, wheezing or rhonchi  ABDOMEN: Soft, non-tender, non-distended MUSCULOSKELETAL:  No edema; No deformity  SKIN: Warm and dry NEUROLOGIC:  Alert and oriented x 3 PSYCHIATRIC:  Normal affect   ASSESSMENT:    No diagnosis found. PLAN:    In order of problems listed above:       {Are you ordering a CV Procedure (e.g. stress test, cath, DCCV, TEE, etc)?   Press F2        :[id number]}    Medication Adjustments/Labs and Tests Ordered: Current medicines are reviewed at length with the patient today.  Concerns regarding medicines are outlined above.  No orders of the defined types were placed in this encounter.  No orders of the defined types were placed in this encounter.   There are no Patient Instructions on file for this visit.   Signed, Kathyrn Drown, NP  04/06/2021 10:47 AM    [place]

## 2021-04-07 ENCOUNTER — Ambulatory Visit: Payer: Medicare HMO | Admitting: Physician Assistant

## 2021-04-07 ENCOUNTER — Ambulatory Visit (INDEPENDENT_AMBULATORY_CARE_PROVIDER_SITE_OTHER): Payer: Medicare HMO | Admitting: Cardiology

## 2021-04-07 ENCOUNTER — Other Ambulatory Visit: Payer: Self-pay

## 2021-04-07 ENCOUNTER — Encounter: Payer: Self-pay | Admitting: Cardiology

## 2021-04-07 VITALS — BP 130/64 | HR 70 | Ht 70.0 in | Wt 212.0 lb

## 2021-04-07 DIAGNOSIS — Z9889 Other specified postprocedural states: Secondary | ICD-10-CM

## 2021-04-07 DIAGNOSIS — I340 Nonrheumatic mitral (valve) insufficiency: Secondary | ICD-10-CM | POA: Diagnosis not present

## 2021-04-07 DIAGNOSIS — Z95818 Presence of other cardiac implants and grafts: Secondary | ICD-10-CM | POA: Diagnosis not present

## 2021-04-07 DIAGNOSIS — I4821 Permanent atrial fibrillation: Secondary | ICD-10-CM

## 2021-04-07 DIAGNOSIS — I10 Essential (primary) hypertension: Secondary | ICD-10-CM | POA: Diagnosis not present

## 2021-04-07 DIAGNOSIS — Z950 Presence of cardiac pacemaker: Secondary | ICD-10-CM

## 2021-04-07 DIAGNOSIS — I5032 Chronic diastolic (congestive) heart failure: Secondary | ICD-10-CM | POA: Diagnosis not present

## 2021-04-07 MED ORDER — AMOXICILLIN 500 MG PO CAPS: 2000.0000 mg | ORAL_CAPSULE | ORAL | 12 refills | Status: DC

## 2021-04-07 NOTE — Patient Instructions (Signed)
Medication Instructions:  No changes *If you need a refill on your cardiac medications before your next appointment, please call your pharmacy*   Lab Work: none If you have labs (blood work) drawn today and your tests are completely normal, you will receive your results only by: . MyChart Message (if you have MyChart) OR . A paper copy in the mail If you have any lab test that is abnormal or we need to change your treatment, we will call you to review the results.   Testing/Procedures: none   Follow-Up: As planned  Other Instructions   

## 2021-04-07 NOTE — Progress Notes (Signed)
HEART AND [place]                                     Cardiology Office Note:     Date:  04/06/2021    ID:  Austin Mesi., DOB 01/04/1932, MRN [id number]   PCP:  [name], MD             [facility] Cardiologist:  Minus Breeding, MD/ Dr. Sherren Mocha, MD    Referring MD: [name], MD    CC: TOC follow up s/p MitraClip   History of Present Illness:     Austin Schewe. is a 87 y.o. male with a hx of chronic persistent atrial fibrillation on Xarelto, CHB s/p PPM, HTN, chronic diastolic heart failure and severe mitral regurgitation who presented to [facility] 03/25/21 for planned transcatheter edge-to-edge mitral valve repair with MitraClip performed by Dr. Burt Knack.    He underwent an echocardiogram 12/07/20 which showed EF 55%, severe biatrial enlargement and severe mitral regurgitation. The mitral valve is myxomatous. The jet is eccentric and anteriorly directed. There is splay artifact suggestive of at least moderate regurgitation. The MR is poorly quantitated. Most impresive  images are PLAX. Suspect it is just very eccentric. There is likely a flail PMVL chord to explain these findings, and abrupt change from prior echo. Subsequent TEE 12/23/20 showed EF 55%, mild RV dysfunction/moderate enlargement. RV pacing wire is present. RVSP is 59mmHg+RAP. Severe biatrial enlargement. Severe TR (suspected to be functional due to severe annular dilation). 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 EROA 0.4cm2 with RVol 53mL by PISA. No evidence of mitral stenosis.    He was seen by Dr. Burt Knack on 12/30/20 for structural heart consultation and discussion of potential Mitral Valve TEER. The patient's transesophageal echo demonstrated findings consistent with severe mitral regurgitation secondary to degenerative  changes of ruptured primary chordae tendonae and flail P2 segment of the mitral valve with eccentric MR. He did not appear to be particularly limited at that time with only mild shortness of breath. He initially deferred procedure but then later changed his mind. L/RHC on 02/24/21 showed mild diffuse CAD and confirmed severe MR with large V waves (28 mm hg).   He underwent successful transcatheter edge-to-edge mitral valve repair with placement of a MitraClip G4 XTW clip in the A2/P2 and placement of a MitraClip G4 NTW positioned in the lateral side of A2/P2 reducing MR from 4+ at baseline to a 1-2+ in the post procedural setting. He is not recommended for concurrent antiplatelet therapy given the need for Xarelto for anticoagulation and previous recurrent GI bleeds. He will need SBE prophylaxis for 6 months post implant.     Post procedure echocardiogram performed prior to hospital discharge which showed mild eccentric residual MR however overall the MitraClip result showed excellent reduction in the degree of mitral regurgitation.    Today he is here for follow up with his great granddaughter. He reports he is "like a new man". He denies SOB and his LE edema is faint. He was seen by his PCP Monday at which time his Lasix was reduced to QD dosing given his improvement. Labs look great. He denies chest pain, LE edema, dizziness, or orthopnea symptoms. He continues to walk with  Rolator, mostly for safety. He is now planning to go to a family reunion later this month.        Past Medical History:  Diagnosis Date   Anxiety     Atrial fibrillation ([place])      Chronic   Cataracts, bilateral      to have left eye cataract surgery on 03/21/17    CHF (congestive heart failure), NYHA class II, chronic, diastolic (HCC)     Colon polyps      last colonoscopy 2015 (tubular adenoma)   Depression     GERD (gastroesophageal reflux disease)     H/O: GI bleed      Recurrent   Hemorrhoids     History of BPH      History of kidney stones      hx of x 1  passed stones   Hypertension     Pernicious anemia     Presence of permanent cardiac pacemaker     PVC (premature ventricular contraction)     S/P mitral valve clip implantation 03/25/2021    Successful transcatheter edge-to-edge mitral valve repair with placement of a MitraClip G4 XTW clip positioned A2/P2 and placement of a MitraClip G4 NTW clip positioned on the lateral side of A2/P2, reducing mitral regurgitation from 4+ at baseline to 1-2+ post procedure   Severe mitral regurgitation     Vitamin D deficiency             Past Surgical History:  Procedure Laterality Date   COLONOSCOPY WITH PROPOFOL N/A 03/17/2017    Procedure: COLONOSCOPY WITH PROPOFOL;  Surgeon: Carol Ada, MD;  Location: [facility];  Service: Endoscopy;  Laterality: N/A;   [place]; ~ 2002    left; right   MITRAL VALVE REPAIR N/A 03/25/2021    Procedure: MITRAL VALVE REPAIR;  Surgeon: Sherren Mocha, MD;  Location: [place] CV LAB;  Service: Cardiovascular;  Laterality: N/A;   MULTIPLE EXTRACTIONS WITH ALVEOLOPLASTY N/A 03/18/2021    Procedure: MULTIPLE EXTRACTION WITH ALVEOLOPLASTY;  Surgeon: Charlaine Dalton, DMD;  Location: [place];  Service: Dentistry;  Laterality: N/A;   PACEMAKER IMPLANT N/A 03/11/2019    Procedure: PACEMAKER IMPLANT;  Surgeon: Constance Haw, MD;  Location: [place] CV LAB;  Service: Cardiovascular;  Laterality: N/A;   RIGHT/LEFT HEART CATH AND CORONARY ANGIOGRAPHY N/A 02/24/2021    Procedure: RIGHT/LEFT HEART CATH AND CORONARY ANGIOGRAPHY;  Surgeon: Sherren Mocha, MD;  Location: [place] CV LAB;  Service: Cardiovascular;  Laterality: N/A;   TEE WITHOUT CARDIOVERSION N/A 12/23/2020    Procedure: TRANSESOPHAGEAL ECHOCARDIOGRAM (TEE);  Surgeon: Freada Bergeron, MD;  Location: [facility] ENDOSCOPY;  Service: Cardiovascular;  Laterality: N/A;   TEE WITHOUT CARDIOVERSION N/A 03/25/2021    Procedure:  TRANSESOPHAGEAL ECHOCARDIOGRAM (TEE);  Surgeon: Sherren Mocha, MD;  Location: [place] CV LAB;  Service: Cardiovascular;  Laterality: N/A;   TEMPORARY PACEMAKER N/A 03/10/2019    Procedure: TEMPORARY PACEMAKER;  Surgeon: Martinique, Peter M, MD;  Location: [place] CV LAB;  Service: Cardiovascular;  Laterality: N/A;      Current Medications: Active Medications  No outpatient medications have been marked as taking for the 04/07/21 encounter (Appointment) with Eileen Stanford, PA-C.        Allergies:   Patient has no known allergies.    Social History         Socioeconomic History   Marital status: Widowed  Spouse name: Not on file   Number of children: 1   Years of education: Not on file   Highest education level: Not on file  Occupational History   Occupation: Retired  Tobacco Use   Smoking status: Never   Smokeless tobacco: Former      Types: Snuff   Tobacco comments:      03/17/21 "still use snuff daily"  Vaping Use   Vaping Use: Never used  Substance and Sexual Activity   Alcohol use: No   Drug use: No   Sexual activity: Never      Comment: widower, lives with granddaughter and great grandchildren.  Other Topics Concern   Not on file  Social History Narrative    Lives with grand daughter.              Epworth Sleepiness Scale = 7 (as of 09/21/2015)    Social Determinants of Health    Financial Resource Strain: Not on file  Food Insecurity: Not on file  Transportation Needs: Not on file  Physical Activity: Not on file  Stress: Not on file  Social Connections: Not on file      Family History: The patient's family history includes Cancer in his mother; Stroke (age of onset: 42) in his father.   ROS:   Please see the history of present illness.    All other systems reviewed and are negative.   EKGs/Labs/Other Studies Reviewed:     The following studies were reviewed today:   Mitral Valve Repair (TEER) 03/25/21:   Successful transcatheter  edge-to-edge mitral valve repair with placement of a MitraClip G4 XTW clip positioned A2/P2 and placement of a MitraClip G4 NTW clip positioned on the lateral side of A2/P2, reducing mitral regurgitation from 4+ at baseline to 1-2+ post procedure   Recommend to resume Rivaroxaban, at currently prescribed dose and frequency on 03/25/2021. Concurrent antiplatelet therapy not recommended _____________   Echo 03/26/21:    1. Left ventricular ejection fraction, by estimation, is 50 to 55%. The  left ventricle has low normal function. The left ventricle demonstrates  global hypokinesis. Left ventricular diastolic function could not be  evaluated.   2. Right ventricular systolic function is mildly reduced. The right  ventricular size is mildly enlarged. There is moderately elevated  pulmonary artery systolic pressure.   3. Left atrial size was severely dilated.   4. Right atrial size was severely dilated.   5. There are two well-seated MitraClips in the A2P2 (middle scallop)  area. There is a residual mitral insufficiency jet, medial to the clips,  mild in severity. The mitral valve has been repaired/replaced. Mild mitral  valve regurgitation. Mild mitral  stenosis. The mean mitral valve gradient is 4.7 mmHg at 70 bpm. Pressure  half time is 121 ms.   6. Tricuspid valve regurgitation is moderate to severe.   7. The aortic valve is tricuspid. There is mild calcification of the  aortic valve. There is mild thickening of the aortic valve. Aortic valve  regurgitation is mild. No aortic stenosis is present.   8. The inferior vena cava is dilated in size with <50% respiratory  variability, suggesting right atrial pressure of 15 mmHg.    Comparison(s): Mildly elevated mitral valve gradients and mild residual  mitral insufficiency is unchanged from yesterday's post=procedure  findings. There is evidence of increased right ventricular systolic  pressure/pulmonary artery pressure and increased   right  atrial pressure.    R/LHC 02/24/21:     Prox  RCA lesion is 30% stenosed.   1.  Mild diffuse coronary irregularity/plaquing with no significant obstructive disease 2.  Known severe mitral regurgitation with large V waves (28 mmHg) 3.  Normal LV pressure, essentially normal right heart pressures  Plan: Continue evaluation for transcatheter edge-to-edge mitral valve repair     EKG:  EKG is not ordered today.    Recent Labs: 03/23/2021: ALT 25 04/05/2021: BUN 12; Creat 0.98; Hemoglobin 11.9; Platelets 138; Potassium 4.2; Sodium 139  Recent Lipid Panel Labs (Brief)          Component Value Date/Time    CHOL 179 12/01/2020 0808    TRIG 58 12/01/2020 0808    HDL 62 12/01/2020 0808    CHOLHDL 2.9 12/01/2020 0808    VLDL 11 04/08/2016 0823    LDLCALC 103 (H) 12/01/2020 0808        Risk Assessment/Calculations:    CHA2DS2-VASc Score = 5  This indicates a 7.2% annual risk of stroke. The patient's score is based upon: CHF History: 1 HTN History: 1 Diabetes History: 0 Stroke History: 0 Vascular Disease History: 1 Age Score: 2 Gender Score: 0   Physical Exam:     VS:  There were no vitals taken for this visit.        Wt Readings from Last 3 Encounters:  04/05/21 211 lb 12.8 oz (96.1 kg)  03/25/21 209 lb (94.8 kg)  03/23/21 209 lb 6.4 oz (95 kg)     GEN:  Elderly, well nourished, well developed in no acute distress HEENT: Normal NECK: No JVD; No carotid bruits CARDIAC: Irregularly irregular with soft murmur RESPIRATORY: Clear to auscultation without rales, wheezing or rhonchi  SKIN: Warm and dry NEUROLOGIC:  Alert and oriented x 3 PSYCHIATRIC:  Normal affect    ASSESSMENT/PLAN:     1. Non-rheumatic mitral valve regurgitation: -Underwent TEER with placement of a MitraClip G4 XTW clip in the A2/P2 and placement of a MitraClip G4 NTW positioned in the lateral side of A2/P2 reducing MR from 4+ at baseline to a 1-2+ in the post procedural setting.  -Recommended Xarelto  only given previous recurrent GI bleeds. -CBC stable -Groin site stable  -Reports significant improvement in symptoms including SOB and LE edema -Has one month follow up 04/21/21 -SBE with 2g Amoxicillin PO 1 hour prior to dental work>>reviewed indication with patient today    2. Persistent atrial fibrillation: -CHA2DS2VASc score 5 (age,CHF, HTN) -Continue Xarelto -Denies bleeding in stool or urine -CBC from PCP labs 04/05/21 with stable Hb  -No plans for concurrent antiplatelet therapy given need for AC   3. HTN: -Stable, 130/64>>no change in medical therapy    4. S/p PPM: -Followed by Dr. Curt Bears -Last interrogation 03/24/21 with normal battery life and lead parameters    Medication Adjustments/Labs and Tests Ordered: Current medicines are reviewed at length with the patient today.  Concerns regarding medicines are outlined above.  No orders of the defined types were placed in this encounter.   No orders of the defined types were placed in this encounter.     There are no Patient Instructions on file for this visit.    Signed, Kathyrn Drown, NP  04/06/2021 10:47 AM    [place]

## 2021-04-12 ENCOUNTER — Telehealth: Payer: Self-pay | Admitting: Pharmacist

## 2021-04-12 NOTE — Progress Notes (Signed)
Chronic Care Management Pharmacy Assistant   Name: Austin Gray.  MRN: [id number] DOB: December 31, 1931  Reason for Encounter: Disease State For HTN.    Conditions to be addressed/monitored: HTN, Afib, CHF, dyslipidemia.  Recent office visits:  04/05/21 Dr. Dennard Schaumann For hospital follow-up. No medication changes.  02/11/21 Dr. Dennard Schaumann For follow-up. No medication changes.   Recent consult visits:  04/07/21 Cardiology Tommie Raymond, NP. For follow-up. STARTED Amoxicillin 2000 take 4 tablets 1 hour prior to dinner work 03/24/21 Dentistry Owsley, Madison B, DMD. For loss of teeth due to extraction. Per note: At this point I will decrease his dose of Lasix to 40 mg once daily and asked him to monitor his weight and for any swelling. 03/12/21 Cardiothoracic Surgery Lajuana Matte, MD For mitral Valve disease. No medication changes.  03/01/21 Dentistry Owsley, [place], DMD For mitral regurgitation.No medication changes.  02/18/21 Cardiology Eileen Stanford, PA-C. For follow-up. No medication changes.  12/30/20 Cardiology Sherren Mocha, MD. For mitral regurgitation. No medication changes.  12/21/20 Cardiology Minus Breeding, MD. For follow-up/Edema No medication changes. Per note: Patient is preparing for TEE procedure.   Hospital visits:  03/25/21 [facility] (31 Hours) Sherren Mocha, MD. For Mitral Valve Clip Implantation. STOPPED Amoxicillin. 03/18/21 [facility] (4 Hours) Sandi Mariscal B, DMD for Multiple Extraction With Alveoloplasty.  02/24/21 [facility] (5 Hours) Sherren Mocha, MD. For right/left heart cath and coronary angiography.  12/23/20 [facility] (4 Hours) Freada Bergeron, MD. For Transesophageal Echocardiogram.   Medications: Outpatient Encounter Medications as of 04/12/2021  Medication Sig   acetaminophen (TYLENOL) 500 MG tablet Take 500 mg by mouth every 6 (six) hours as needed for mild pain,  fever or headache.   albuterol (VENTOLIN HFA) 108 (90 Base) MCG/ACT inhaler INHALE 2 PUFFS EVERY 6 HOURS AS NEEDED FOR WHEEZING OR SHORTNESS OF BREATH   amLODipine (NORVASC) 10 MG tablet Take 1 tablet (10 mg total) by mouth daily.   amoxicillin (AMOXIL) 500 MG capsule Take 4 capsules (2,000 mg total) by mouth as directed. Take 4 tablets 1 hour prior to dental work, including cleanings.   carvedilol (COREG) 25 MG tablet Take 1 tablet (25 mg total) by mouth 2 (two) times daily.   ferrous sulfate 325 (65 FE) MG tablet Take 325 mg by mouth every other day.    fluticasone (FLONASE) 50 MCG/ACT nasal spray USE 1 SPRAY IN EACH NOSTRIL EVERY DAY   furosemide (LASIX) 40 MG tablet Take 1 tablet (40 mg total) by mouth 2 (two) times daily.   guaiFENesin-dextromethorphan (ROBITUSSIN DM) 100-10 MG/5ML syrup Take 5 mLs by mouth every 4 (four) hours as needed for cough.   losartan (COZAAR) 50 MG tablet TAKE 1 TABLET EVERY DAY   Multiple Vitamin (MULTIVITAMIN) tablet Take 1 tablet by mouth daily.   multivitamin-lutein (OCUVITE-LUTEIN) CAPS Take 1 capsule by mouth daily.   polyvinyl alcohol (LIQUIFILM TEARS) 1.4 % ophthalmic solution Place 1 drop into both eyes as needed for dry eyes.   potassium chloride (KLOR-CON) 10 MEQ tablet TAKE 1 TABLET EVERY DAY   vitamin B-12 (CYANOCOBALAMIN) 1000 MCG tablet TAKE 1 TABLET EVERY DAY   XARELTO 20 MG TABS tablet TAKE 1 TABLET EVERY DAY WITH SUPPER   No facility-administered encounter medications on file as of 04/12/2021.   Reviewed chart prior to disease state call. Spoke with patient regarding BP  Recent Office Vitals: BP Readings from Last 3 Encounters:  04/07/21 130/64  04/05/21 126/68  03/26/21 129/78   Pulse Readings from Last 3 Encounters:  04/07/21 70  04/05/21 70  03/26/21 71    Wt Readings from Last 3 Encounters:  04/07/21 212 lb (96.2 kg)  04/05/21 211 lb 12.8 oz (96.1 kg)  03/25/21 209 lb (94.8 kg)     Kidney Function Lab Results  Component Value  Date/Time   CREATININE 0.98 04/05/2021 12:30 PM   CREATININE 0.72 03/26/2021 12:26 AM   CREATININE 0.91 03/23/2021 02:29 PM   CREATININE 0.94 12/01/2020 08:08 AM   GFRNONAA >60 03/26/2021 12:26 AM   GFRNONAA 73 12/01/2020 08:08 AM   GFRAA 85 12/01/2020 08:08 AM    BMP Latest Ref Rng & Units 04/05/2021 03/26/2021 03/23/2021  Glucose 65 - 99 mg/dL 125(H) 159(H) 122(H)  BUN 7 - 25 mg/dL 12 10 17   Creatinine 0.70 - 1.22 mg/dL 0.98 0.72 0.91  BUN/Creat Ratio 6 - 22 (calc) NOT APPLICABLE - -  Sodium 546 - 146 mmol/L 139 136 137  Potassium 3.5 - 5.3 mmol/L 4.2 3.8 3.9  Chloride 98 - 110 mmol/L 104 102 103  CO2 20 - 32 mmol/L 28 27 26   Calcium 8.6 - 10.3 mg/dL 9.1 8.5(L) 9.0    Current antihypertensive regimen:  Losartan 50 mg 1 tablet daily  Carvedilol 25 mg 1 tablet by mouth 2 times daily.   How often are you checking your Blood Pressure? Patient stated daily  Current home BP readings: 119/73-9/26/22  What recent interventions/DTPs have been made by any provider to improve Blood Pressure control since last CPP Visit: None.  Any recent hospitalizations or ED visits since last visit with CPP? Yes, documented above.   What diet changes have been made to improve Blood Pressure Control?  Patient stated he eats a well rounded diet.   What exercise is being done to improve your Blood Pressure Control?  Patient stated he is active. He stated he works outside and does some things around the house.   Adherence Review: Is the patient currently on ACE/ARB medication? Losartan 50 mg  Does the patient have >5 day gap between last estimated fill dates? Per misc rpts, yes.  Care Gaps:Patient is due for his AWV-Scheduled for Oct 13 th at 2 pm in person.   Star Rating Drugs: Losartan 50 mg 01/05/21 90 DS.   Follow-Up:Pharmacist Review  Charlann Lange, [place] Pharmacist Assistant [phone]

## 2021-04-21 ENCOUNTER — Ambulatory Visit (INDEPENDENT_AMBULATORY_CARE_PROVIDER_SITE_OTHER): Payer: Medicare HMO | Admitting: Physician Assistant

## 2021-04-21 ENCOUNTER — Telehealth: Payer: Self-pay | Admitting: Family Medicine

## 2021-04-21 ENCOUNTER — Ambulatory Visit (HOSPITAL_COMMUNITY): Payer: Medicare HMO | Attending: Cardiovascular Disease

## 2021-04-21 ENCOUNTER — Other Ambulatory Visit: Payer: Self-pay

## 2021-04-21 ENCOUNTER — Encounter: Payer: Self-pay | Admitting: Physician Assistant

## 2021-04-21 VITALS — BP 120/70 | HR 70 | Ht 70.0 in

## 2021-04-21 DIAGNOSIS — Z950 Presence of cardiac pacemaker: Secondary | ICD-10-CM

## 2021-04-21 DIAGNOSIS — Z9889 Other specified postprocedural states: Secondary | ICD-10-CM

## 2021-04-21 DIAGNOSIS — I4821 Permanent atrial fibrillation: Secondary | ICD-10-CM | POA: Diagnosis not present

## 2021-04-21 DIAGNOSIS — I10 Essential (primary) hypertension: Secondary | ICD-10-CM

## 2021-04-21 DIAGNOSIS — I5032 Chronic diastolic (congestive) heart failure: Secondary | ICD-10-CM | POA: Diagnosis not present

## 2021-04-21 DIAGNOSIS — I340 Nonrheumatic mitral (valve) insufficiency: Secondary | ICD-10-CM

## 2021-04-21 DIAGNOSIS — Z95818 Presence of other cardiac implants and grafts: Secondary | ICD-10-CM | POA: Diagnosis not present

## 2021-04-21 DIAGNOSIS — Z954 Presence of other heart-valve replacement: Secondary | ICD-10-CM

## 2021-04-21 LAB — ECHOCARDIOGRAM COMPLETE
Area-P 1/2: 1.89 cm2
MV VTI: 0.97 cm2
P 1/2 time: 536 msec
S' Lateral: 3.5 cm

## 2021-04-21 MED ORDER — AMLODIPINE BESYLATE 10 MG PO TABS: 10.0000 mg | ORAL_TABLET | Freq: Every day | ORAL | 3 refills | Status: DC

## 2021-04-21 NOTE — Progress Notes (Addendum)
HEART AND [place]                                     Cardiology Office Note:    Date:  04/22/2021   ID:  Austin Mesi., DOB [birth year], MRN [id number]  PCP:  [name], MD  [facility] Cardiologist:  Minus Breeding, MD/Dr. Sherren Mocha, MD (MitraClip) [facility] Electrophysiologist:  Dr. Curt Bears, MD   Referring MD: [name], MD   Chief Complaint  Patient presents with   Follow-up    1 month follow up s/p TEER    History of Present Illness:    Austin Gray. is a 87 y.o. male with a hx of chronic persistent atrial fibrillation on Xarelto, CHB s/p PPM, HTN, chronic diastolic heart failure and severe mitral regurgitation who presents for 1 month follow s/p transcatheter edge-to-edge mitral valve repair with MitraClip performed by Dr. Burt Knack 03/25/21 who presents to clinic for follow up.   He underwent an echocardiogram 12/07/20 which showed EF 55%, severe biatrial enlargement and severe mitral regurgitation. The mitral valve is myxomatous. The jet is eccentric and anteriorly directed. There is splay artifact suggestive of at least moderate regurgitation. The MR is poorly quantitated. Most impresive  images are PLAX. Suspect it is just very eccentric. There is likely a flail PMVL chord to explain these findings, and abrupt change from prior echo. Subsequent TEE 12/23/20 showed EF 55%, mild RV dysfunction/moderate enlargement. RV pacing wire is present. RVSP is 44mmHg+RAP. Severe biatrial enlargement. Severe TR (suspected to be functional due to severe annular dilation). 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 EROA 0.4cm2 with RVol 25mL by PISA. No evidence of mitral stenosis.    He was seen by Dr. Burt Knack on 12/30/20 for structural heart consultation and discussion of potential Mitral  Valve TEER. The patient's transesophageal echo demonstrated findings consistent with severe mitral regurgitation secondary to degenerative changes of ruptured primary chordae tendonae and flail P2 segment of the mitral valve with eccentric MR. He did not appear to be particularly limited at that time with only mild shortness of breath. He initially deferred procedure but then later changed his mind. L/RHC on 02/24/21 showed mild diffuse CAD and confirmed severe MR with large V waves (28 mm hg).   He underwent successful transcatheter edge-to-edge mitral valve repair with placement of a MitraClip G4 XTW clip in the A2/P2 and placement of a MitraClip G4 NTW positioned in the lateral side of A2/P2 reducing MR from 4+ at baseline to a 1-2+ in the post procedural setting. He is not recommended for concurrent antiplatelet therapy given the need for Xarelto for anticoagulation and previous recurrent GI bleeds. Post procedure echocardiogram performed prior to hospital discharge showed mild eccentric residual MR however overall the MitraClip result showed excellent reduction in the degree of mitral regurgitation.   Today he presents with his granddaughter who typically comes with him to office visits. He continues to do well post TEER and reports no significant SOB although does have some mild dyspnea today felt to be related to allergies. He has no wheezing or SOB on exam today. He denies chest pain, palpitations, LE edema, orthopnea, dizziness or syncope. He is tolerating Xarelto with no s/s of bleeding in stool or urine. We  reviewed the one month post TEER echo today with patient.   Past Medical History:  Diagnosis Date   Anxiety    Atrial fibrillation ([place])    Chronic   Cataracts, bilateral    to have left eye cataract surgery on 03/21/17    CHF (congestive heart failure), NYHA class II, chronic, diastolic (HCC)    Colon polyps    last colonoscopy 2015 (tubular adenoma)   Depression    GERD  (gastroesophageal reflux disease)    H/O: GI bleed    Recurrent   Hemorrhoids    History of BPH    History of kidney stones    hx of x 1  passed stones   Hypertension    Pernicious anemia    Presence of permanent cardiac pacemaker    PVC (premature ventricular contraction)    S/P mitral valve clip implantation 03/25/2021   Successful transcatheter edge-to-edge mitral valve repair with placement of a MitraClip G4 XTW clip positioned A2/P2 and placement of a MitraClip G4 NTW clip positioned on the lateral side of A2/P2, reducing mitral regurgitation from 4+ at baseline to 1-2+ post procedure   Severe mitral regurgitation    Vitamin D deficiency     Past Surgical History:  Procedure Laterality Date   COLONOSCOPY WITH PROPOFOL N/A 03/17/2017   Procedure: COLONOSCOPY WITH PROPOFOL;  Surgeon: Carol Ada, MD;  Location: [facility];  Service: Endoscopy;  Laterality: N/A;   [place]; ~ 2002   left; right   MITRAL VALVE REPAIR N/A 03/25/2021   Procedure: MITRAL VALVE REPAIR;  Surgeon: Sherren Mocha, MD;  Location: [place] [facility];  Service: Cardiovascular;  Laterality: N/A;   MULTIPLE EXTRACTIONS WITH ALVEOLOPLASTY N/A 03/18/2021   Procedure: MULTIPLE EXTRACTION WITH ALVEOLOPLASTY;  Surgeon: Charlaine Dalton, DMD;  Location: [place];  Service: Dentistry;  Laterality: N/A;   PACEMAKER IMPLANT N/A 03/11/2019   Procedure: PACEMAKER IMPLANT;  Surgeon: Constance Haw, MD;  Location: [place] [facility];  Service: Cardiovascular;  Laterality: N/A;   RIGHT/LEFT HEART CATH AND CORONARY ANGIOGRAPHY N/A 02/24/2021   Procedure: RIGHT/LEFT HEART CATH AND CORONARY ANGIOGRAPHY;  Surgeon: Sherren Mocha, MD;  Location: [place] [facility];  Service: Cardiovascular;  Laterality: N/A;   TEE WITHOUT CARDIOVERSION N/A 12/23/2020   Procedure: TRANSESOPHAGEAL ECHOCARDIOGRAM (TEE);  Surgeon: Freada Bergeron, MD;  Location: [facility];  Service: Cardiovascular;   Laterality: N/A;   TEE WITHOUT CARDIOVERSION N/A 03/25/2021   Procedure: TRANSESOPHAGEAL ECHOCARDIOGRAM (TEE);  Surgeon: Sherren Mocha, MD;  Location: [place] [facility];  Service: Cardiovascular;  Laterality: N/A;   TEMPORARY PACEMAKER N/A 03/10/2019   Procedure: TEMPORARY PACEMAKER;  Surgeon: Martinique, Peter M, MD;  Location: [place] [facility];  Service: Cardiovascular;  Laterality: N/A;    Current Medications: Current Meds  Medication Sig   acetaminophen (TYLENOL) 500 MG tablet Take 500 mg by mouth every 6 (six) hours as needed for mild pain, fever or headache.   albuterol (VENTOLIN HFA) 108 (90 Base) MCG/ACT inhaler INHALE 2 PUFFS EVERY 6 HOURS AS NEEDED FOR WHEEZING OR SHORTNESS OF BREATH   amoxicillin (AMOXIL) 500 MG capsule Take 4 capsules (2,000 mg total) by mouth as directed. Take 4 tablets 1 hour prior to dental work, including cleanings.   carvedilol (COREG) 25 MG tablet Take 1 tablet (25 mg total) by mouth 2 (two) times daily.   ferrous sulfate 325 (65 FE) MG tablet Take 325 mg by mouth every other  day.    fluticasone (FLONASE) 50 MCG/ACT nasal spray USE 1 SPRAY IN EACH NOSTRIL EVERY DAY   furosemide (LASIX) 40 MG tablet Take 1 tablet (40 mg total) by mouth 2 (two) times daily.   guaiFENesin-dextromethorphan (ROBITUSSIN DM) 100-10 MG/5ML syrup Take 5 mLs by mouth every 4 (four) hours as needed for cough.   losartan (COZAAR) 50 MG tablet TAKE 1 TABLET EVERY DAY   Multiple Vitamin (MULTIVITAMIN) tablet Take 1 tablet by mouth daily.   multivitamin-lutein (OCUVITE-LUTEIN) CAPS Take 1 capsule by mouth daily.   polyvinyl alcohol (LIQUIFILM TEARS) 1.4 % ophthalmic solution Place 1 drop into both eyes as needed for dry eyes.   potassium chloride (KLOR-CON) 10 MEQ tablet TAKE 1 TABLET EVERY DAY   vitamin B-12 (CYANOCOBALAMIN) 1000 MCG tablet TAKE 1 TABLET EVERY DAY   XARELTO 20 MG TABS tablet TAKE 1 TABLET EVERY DAY WITH SUPPER   [DISCONTINUED] amLODipine (NORVASC) 10 MG tablet Take 1  tablet (10 mg total) by mouth daily.     Allergies:   Patient has no known allergies.   Social History   Socioeconomic History   Marital status: Widowed    Spouse name: Not on file   Number of children: 1   Years of education: Not on file   Highest education level: Not on file  Occupational History   Occupation: Retired  Tobacco Use   Smoking status: Never   Smokeless tobacco: Former    Types: Snuff   Tobacco comments:    03/17/21 "still use snuff daily"  Vaping Use   Vaping Use: Never used  Substance and Sexual Activity   Alcohol use: No   Drug use: No   Sexual activity: Never    Comment: widower, lives with granddaughter and great grandchildren.  Other Topics Concern   Not on file  Social History Narrative   Lives with grand daughter.         Epworth Sleepiness Scale = 7 (as of 09/21/2015)   Social Determinants of Health   Financial Resource Strain: Not on file  Food Insecurity: Not on file  Transportation Needs: Not on file  Physical Activity: Not on file  Stress: Not on file  Social Connections: Not on file     Family History: The patient's family history includes Cancer in his mother; Stroke (age of onset: 44) in his father.  ROS:   Please see the history of present illness.    All other systems reviewed and are negative.  EKGs/Labs/Other Studies Reviewed:    The following studies were reviewed today:   Mitral Valve Repair (TEER) 03/25/21:   Successful transcatheter edge-to-edge mitral valve repair with placement of a MitraClip G4 XTW clip positioned A2/P2 and placement of a MitraClip G4 NTW clip positioned on the lateral side of A2/P2, reducing mitral regurgitation from 4+ at baseline to 1-2+ post procedure   Recommend to resume Rivaroxaban, at currently prescribed dose and frequency on 03/25/2021. Concurrent antiplatelet therapy not recommended _____________   Echo 03/26/21:    1. Left ventricular ejection fraction, by estimation, is 50 to 55%. The   left ventricle has low normal function. The left ventricle demonstrates  global hypokinesis. Left ventricular diastolic function could not be  evaluated.   2. Right ventricular systolic function is mildly reduced. The right  ventricular size is mildly enlarged. There is moderately elevated  pulmonary artery systolic pressure.   3. Left atrial size was severely dilated.   4. Right atrial size was severely dilated.  5. There are two well-seated MitraClips in the A2P2 (middle scallop)  area. There is a residual mitral insufficiency jet, medial to the clips,  mild in severity. The mitral valve has been repaired/replaced. Mild mitral  valve regurgitation. Mild mitral  stenosis. The mean mitral valve gradient is 4.7 mmHg at 70 bpm. Pressure  half time is 121 ms.   6. Tricuspid valve regurgitation is moderate to severe.   7. The aortic valve is tricuspid. There is mild calcification of the  aortic valve. There is mild thickening of the aortic valve. Aortic valve  regurgitation is mild. No aortic stenosis is present.   8. The inferior vena cava is dilated in size with <50% respiratory  variability, suggesting right atrial pressure of 15 mmHg.    Comparison(s): Mildly elevated mitral valve gradients and mild residual  mitral insufficiency is unchanged from yesterday's post=procedure  findings. There is evidence of increased right ventricular systolic  pressure/pulmonary artery pressure and increased   right atrial pressure.    R/LHC 02/24/21:     Prox RCA lesion is 30% stenosed.   1.  Mild diffuse coronary irregularity/plaquing with no significant obstructive disease 2.  Known severe mitral regurgitation with large V waves (28 mmHg) 3.  Normal LV pressure, essentially normal right heart pressures  Plan: Continue evaluation for transcatheter edge-to-edge mitral valve repair   ____________________  Echo 04/21/21 IMPRESSIONS   1. The LV function is difficult to assess but appears to be  moderately  depressed. . Left ventricular ejection fraction, by estimation, is 30 to  35%. The left ventricle has moderately decreased function. The left  ventricle demonstrates global hypokinesis. The left ventricular internal cavity size was moderately  dilated. There is mild asymmetric left ventricular hypertrophy. Left  ventricular diastolic parameters are consistent with Grade III diastolic  dysfunction (restrictive). Elevated left  ventricular end-diastolic pressure.   2. Right ventricular systolic function is normal. The right ventricular  size is moderately enlarged. There is normal pulmonary artery systolic  pressure.   3. Left atrial size was severely dilated.   4. Right atrial size was severely dilated.   5. The mitral valve gradient has . The mitral valve has been  repaired/replaced. Mild to moderate mitral valve regurgitation. Mild to  moderate mitral stenosis.   6. Tricuspid valve regurgitation is moderate.   7. The aortic valve is normal in structure. There is mild calcification  of the aortic valve. Aortic valve regurgitation is mild.   EKG:  EKG is not ordered today.   Recent Labs: 03/23/2021: ALT 25 04/05/2021: BUN 12; Creat 0.98; Hemoglobin 11.9; Platelets 138; Potassium 4.2; Sodium 139  Recent Lipid Panel    Component Value Date/Time   CHOL 179 12/01/2020 0808   TRIG 58 12/01/2020 0808   HDL 62 12/01/2020 0808   CHOLHDL 2.9 12/01/2020 0808   VLDL 11 04/08/2016 0823   LDLCALC 103 (H) 12/01/2020 0808   Physical Exam:    VS:  BP 120/70 (BP Location: Left Arm, Patient Position: Sitting, Cuff Size: Normal)   Pulse 70   Ht 5\' 10"  (1.778 m)   SpO2 95%   BMI 30.42 kg/m     Wt Readings from Last 3 Encounters:  04/07/21 212 lb (96.2 kg)  04/05/21 211 lb 12.8 oz (96.1 kg)  03/25/21 209 lb (94.8 kg)    General: Elderly, NAD Neck: Negative for carotid bruits. No JVD Lungs:Clear to ausculation bilaterally. Breathing is unlabored. Cardiovascular: RRR with S1 S2.  No murmurs, rubs, gallops, or  LV heave appreciated. Abdomen: Soft, non-tender, non-distended with normoactive bowel sounds. No hepatomegaly, No rebound/guarding. No obvious abdominal masses. MSK: Strength and tone appear normal for age. 5/5 in all extremities Extremities: No edema. No clubbing or cyanosis. DP/PT pulses 2+ bilaterally Neuro: Alert and oriented. No focal deficits. No facial asymmetry. MAE spontaneously. Psych: Responds to questions appropriately with normal affect.    ASSESSMENT/PLAN:    1. Non-rheumatic mitral valve regurgitation: -Underwent TEER with placement of a MitraClip G4 XTW clip in the A2/P2 and placement of a MitraClip G4 NTW positioned in the lateral side of A2/P2 reducing MR from 4+ at baseline to a 1-2+ in the post procedural setting.  -Recommended Xarelto only given previous recurrent GI bleeds. -No significant dyspnea reported  - 1 month echo today shows worsening of EF to 30-35% which can be expected to some degree after Clip and may not be as bad as reported. Mitraclip functioning normally with a mean gradient of 6.5 mm hg which is stable.  Mild to moderate residual MR. Patient has NYHA class II symptoms, but big clinical improvement since Clip . -SBE with 2g Amoxicillin PO 1 hour prior to dental work>>reviewed indication with patient today    2. Persistent atrial fibrillation: -CHA2DS2VASc score 5 (age,CHF, HTN) -Continue Xarelto -Denies bleeding in stool or urine -No plans for concurrent antiplatelet therapy given need for AC   3. HTN: -Stable, 120/70>>no change in medical therapy    4. S/p PPM: -Followed by Dr. Virgel Paling 10/27 -Last interrogation 03/24/21 with normal battery life and lead parameters    Medication Adjustments/Labs and Tests Ordered: Current medicines are reviewed at length with the patient today.  Concerns regarding medicines are outlined above.  No orders of the defined types were placed in this encounter.  No orders of the  defined types were placed in this encounter.   Patient Instructions  Medication Instructions:  Your physician recommends that you continue on your current medications as directed. Please refer to the Current Medication list given to you today.  *If you need a refill on your cardiac medications before your next appointment, please call your pharmacy*   Lab Work: None ordered   If you have labs (blood work) drawn today and your tests are completely normal, you will receive your results only by: [place] (if you have MyChart) OR A paper copy in the mail If you have any lab test that is abnormal or we need to change your treatment, we will call you to review the results.   Testing/Procedures: None ordered    Follow-Up: Follow up as scheduled    Other Instructions None     Signed, Kathyrn Drown NP 04/22/2021 3:17 PM    [facility]    I have seen and examined the patient and agree with the assessment and plan as outlined.    Angelena Form PA-C  [facility]

## 2021-04-21 NOTE — Telephone Encounter (Signed)
Prescription sent to pharmacy.

## 2021-04-21 NOTE — Telephone Encounter (Signed)
Pt is calling to see if his Rx amlodipine (NORVASC) 10 MG need to be called in for a years worth to [employer].

## 2021-04-21 NOTE — Patient Instructions (Signed)

## 2021-04-22 ENCOUNTER — Telehealth: Payer: Self-pay | Admitting: Family Medicine

## 2021-04-22 NOTE — Telephone Encounter (Signed)
Patient called to ask if wearing suspenders will interfere with his pacemaker. Please advise at [phone].

## 2021-04-22 NOTE — Telephone Encounter (Signed)
Call placed to patient.   Advised that wearing the actual suspenders will not interfere with function of pacemaker. However, the L side of the suspenders can cause skin irritation above the implanted device. Advised to use extra padding between pacemaker and suspender, or to turn them around to where the X is crossing in front of the body instead.   Verbalized understanding.

## 2021-04-29 ENCOUNTER — Ambulatory Visit (INDEPENDENT_AMBULATORY_CARE_PROVIDER_SITE_OTHER): Payer: Medicare HMO

## 2021-04-29 ENCOUNTER — Other Ambulatory Visit: Payer: Self-pay

## 2021-04-29 ENCOUNTER — Other Ambulatory Visit: Payer: Self-pay | Admitting: Cardiology

## 2021-04-29 VITALS — BP 130/78 | HR 72 | Temp 98.6°F | Ht 70.0 in | Wt 212.0 lb

## 2021-04-29 DIAGNOSIS — Z0000 Encounter for general adult medical examination without abnormal findings: Secondary | ICD-10-CM

## 2021-04-29 NOTE — Patient Instructions (Signed)
[name] , Thank you for taking time to come for your Medicare Wellness Visit. I appreciate your ongoing commitment to your health goals. Please review the following plan we discussed and let me know if I can assist you in the future.   Screening recommendations/referrals: Colonoscopy: Done 03/17/2017 No longer required Recommended yearly ophthalmology/optometry visit for glaucoma screening and checkup Recommended yearly dental visit for hygiene and checkup  Vaccinations: Influenza vaccine: Done 04/05/2021 Pneumococcal vaccine: Done 04/17/2000 and 12/09/2014 Tdap vaccine: Done 06/29/2015 Repeat in 10 years  Shingles vaccine: Shingrix discussed. Please contact your pharmacy for coverage information.     Covid-19: Done 10/18/2019, 11/09/2019, 07/01/2020  Advanced directives: Advance directive discussed with you today. I have provided a copy for you to complete at home and have notarized. Once this is complete please bring a copy in to our office so we can scan it into your chart.   Conditions/risks identified: Aim for 30 minutes of exercise or walking each day, drink 6-8 glasses of water and eat lots of fruits and vegetables.   Next appointment: Follow up in one year for your annual wellness visit. 2023.  Preventive Care 68 Years and Older, Male  Preventive care refers to lifestyle choices and visits with your health care provider that can promote health and wellness. What does preventive care include? A yearly physical exam. This is also called an annual well check. Dental exams once or twice a year. Routine eye exams. Ask your health care provider how often you should have your eyes checked. Personal lifestyle choices, including: Daily care of your teeth and gums. Regular physical activity. Eating a healthy diet. Avoiding tobacco and drug use. Limiting alcohol use. Practicing safe sex. Taking low doses of aspirin every day. Taking vitamin and mineral supplements as recommended by  your health care provider. What happens during an annual well check? The services and screenings done by your health care provider during your annual well check will depend on your age, overall health, lifestyle risk factors, and family history of disease. Counseling  Your health care provider may ask you questions about your: Alcohol use. Tobacco use. Drug use. Emotional well-being. Home and relationship well-being. Sexual activity. Eating habits. History of falls. Memory and ability to understand (cognition). Work and work Statistician. Screening  You may have the following tests or measurements: Height, weight, and BMI. Blood pressure. Lipid and cholesterol levels. These may be checked every 5 years, or more frequently if you are over 60 years old. Skin check. Lung cancer screening. You may have this screening every year starting at age 34 if you have a 30-pack-year history of smoking and currently smoke or have quit within the past 15 years. Fecal occult blood test (FOBT) of the stool. You may have this test every year starting at age 13. Flexible sigmoidoscopy or colonoscopy. You may have a sigmoidoscopy every 5 years or a colonoscopy every 10 years starting at age 48. Prostate cancer screening. Recommendations will vary depending on your family history and other risks. Hepatitis C blood test. Hepatitis B blood test. Sexually transmitted disease (STD) testing. Diabetes screening. This is done by checking your blood sugar (glucose) after you have not eaten for a while (fasting). You may have this done every 1-3 years. Abdominal aortic aneurysm (AAA) screening. You may need this if you are a current or former smoker. Osteoporosis. You may be screened starting at age 34 if you are at high risk. Talk with your health care provider about your test results,  treatment options, and if necessary, the need for more tests. Vaccines  Your health care provider may recommend certain vaccines,  such as: Influenza vaccine. This is recommended every year. Tetanus, diphtheria, and acellular pertussis (Tdap, Td) vaccine. You may need a Td booster every 10 years. Zoster vaccine. You may need this after age 22. Pneumococcal 13-valent conjugate (PCV13) vaccine. One dose is recommended after age 45. Pneumococcal polysaccharide (PPSV23) vaccine. One dose is recommended after age 76. Talk to your health care provider about which screenings and vaccines you need and how often you need them. This information is not intended to replace advice given to you by your health care provider. Make sure you discuss any questions you have with your health care provider. Document Released: 07/31/2015 Document Revised: 03/23/2016 Document Reviewed: 05/05/2015 Elsevier Interactive Patient Education  2017 [place] Prevention in the Home Falls can cause injuries. They can happen to people of all ages. There are many things you can do to make your home safe and to help prevent falls. What can I do on the outside of my home? Regularly fix the edges of walkways and driveways and fix any cracks. Remove anything that might make you trip as you walk through a door, such as a raised step or threshold. Trim any bushes or trees on the path to your home. Use bright outdoor lighting. Clear any walking paths of anything that might make someone trip, such as rocks or tools. Regularly check to see if handrails are loose or broken. Make sure that both sides of any steps have handrails. Any raised decks and porches should have guardrails on the edges. Have any leaves, snow, or ice cleared regularly. Use sand or salt on walking paths during winter. Clean up any spills in your garage right away. This includes oil or grease spills. What can I do in the bathroom? Use night lights. Install grab bars by the toilet and in the tub and shower. Do not use towel bars as grab bars. Use non-skid mats or decals in the tub or  shower. If you need to sit down in the shower, use a plastic, non-slip stool. Keep the floor dry. Clean up any water that spills on the floor as soon as it happens. Remove soap buildup in the tub or shower regularly. Attach bath mats securely with double-sided non-slip rug tape. Do not have throw rugs and other things on the floor that can make you trip. What can I do in the bedroom? Use night lights. Make sure that you have a light by your bed that is easy to reach. Do not use any sheets or blankets that are too big for your bed. They should not hang down onto the floor. Have a firm chair that has side arms. You can use this for support while you get dressed. Do not have throw rugs and other things on the floor that can make you trip. What can I do in the kitchen? Clean up any spills right away. Avoid walking on wet floors. Keep items that you use a lot in easy-to-reach places. If you need to reach something above you, use a strong step stool that has a grab bar. Keep electrical cords out of the way. Do not use floor polish or wax that makes floors slippery. If you must use wax, use non-skid floor wax. Do not have throw rugs and other things on the floor that can make you trip. What can I do with my stairs? Do  not leave any items on the stairs. Make sure that there are handrails on both sides of the stairs and use them. Fix handrails that are broken or loose. Make sure that handrails are as long as the stairways. Check any carpeting to make sure that it is firmly attached to the stairs. Fix any carpet that is loose or worn. Avoid having throw rugs at the top or bottom of the stairs. If you do have throw rugs, attach them to the floor with carpet tape. Make sure that you have a light switch at the top of the stairs and the bottom of the stairs. If you do not have them, ask someone to add them for you. What else can I do to help prevent falls? Wear shoes that: Do not have high heels. Have  rubber bottoms. Are comfortable and fit you well. Are closed at the toe. Do not wear sandals. If you use a stepladder: Make sure that it is fully opened. Do not climb a closed stepladder. Make sure that both sides of the stepladder are locked into place. Ask someone to hold it for you, if possible. Clearly mark and make sure that you can see: Any grab bars or handrails. First and last steps. Where the edge of each step is. Use tools that help you move around (mobility aids) if they are needed. These include: Canes. Walkers. Scooters. Crutches. Turn on the lights when you go into a dark area. Replace any light bulbs as soon as they burn out. Set up your furniture so you have a clear path. Avoid moving your furniture around. If any of your floors are uneven, fix them. If there are any pets around you, be aware of where they are. Review your medicines with your doctor. Some medicines can make you feel dizzy. This can increase your chance of falling. Ask your doctor what other things that you can do to help prevent falls. This information is not intended to replace advice given to you by your health care provider. Make sure you discuss any questions you have with your health care provider. Document Released: 04/30/2009 Document Revised: 12/10/2015 Document Reviewed: 08/08/2014 Elsevier Interactive Patient Education  2017 [employer].

## 2021-04-29 NOTE — Progress Notes (Signed)
Subjective:   Austin Gray. is a 87 y.o. male who presents for Medicare Annual/Subsequent preventive examination.  Review of Systems     Cardiac Risk Factors include: hypertension;advanced age (>53men, >44 women);obesity (BMI >30kg/m2);sedentary lifestyle;male gender;dyslipidemia     Objective:    Today's Vitals   04/29/21 1401 04/29/21 1406  BP: 130/78   Pulse: 72   Temp: 98.6 F (37 C)   SpO2: 99%   Weight: 212 lb (96.2 kg)   Height: 5\' 10"  (1.778 m)   PainSc:  5    Body mass index is 30.42 kg/m.  Advanced Directives 04/29/2021 03/26/2021 03/23/2021 03/18/2021 02/24/2021 12/23/2020 03/10/2019  Does Patient Have a Medical Advance Directive? No No No No No No No  Copy of Healthcare Power of Attorney in Chart? - - - - - - -  Would patient like information on creating a medical advance directive? No - Patient declined No - Patient declined No - Patient declined No - Patient declined No - Patient declined No - Patient declined Yes (Inpatient - patient requests chaplain consult to create a medical advance directive)    Current Medications (verified) Outpatient Encounter Medications as of 04/29/2021  Medication Sig   acetaminophen (TYLENOL) 500 MG tablet Take 500 mg by mouth every 6 (six) hours as needed for mild pain, fever or headache.   albuterol (VENTOLIN HFA) 108 (90 Base) MCG/ACT inhaler INHALE 2 PUFFS EVERY 6 HOURS AS NEEDED FOR WHEEZING OR SHORTNESS OF BREATH   amLODipine (NORVASC) 10 MG tablet Take 1 tablet (10 mg total) by mouth daily.   ferrous sulfate 325 (65 FE) MG tablet Take 325 mg by mouth every other day.    fluticasone (FLONASE) 50 MCG/ACT nasal spray USE 1 SPRAY IN EACH NOSTRIL EVERY DAY   furosemide (LASIX) 40 MG tablet Take 1 tablet (40 mg total) by mouth 2 (two) times daily.   guaiFENesin-dextromethorphan (ROBITUSSIN DM) 100-10 MG/5ML syrup Take 5 mLs by mouth every 4 (four) hours as needed for cough.   losartan (COZAAR) 50 MG tablet TAKE 1 TABLET EVERY DAY    Multiple Vitamin (MULTIVITAMIN) tablet Take 1 tablet by mouth daily.   multivitamin-lutein (OCUVITE-LUTEIN) CAPS Take 1 capsule by mouth daily.   polyvinyl alcohol (LIQUIFILM TEARS) 1.4 % ophthalmic solution Place 1 drop into both eyes as needed for dry eyes.   potassium chloride (KLOR-CON) 10 MEQ tablet TAKE 1 TABLET EVERY DAY   vitamin B-12 (CYANOCOBALAMIN) 1000 MCG tablet TAKE 1 TABLET EVERY DAY   XARELTO 20 MG TABS tablet TAKE 1 TABLET EVERY DAY WITH SUPPER   amoxicillin (AMOXIL) 500 MG capsule Take 4 capsules (2,000 mg total) by mouth as directed. Take 4 tablets 1 hour prior to dental work, including cleanings. (Patient not taking: Reported on 04/29/2021)   carvedilol (COREG) 25 MG tablet Take 1 tablet (25 mg total) by mouth 2 (two) times daily.   No facility-administered encounter medications on file as of 04/29/2021.    Allergies (verified) Patient has no known allergies.   History: Past Medical History:  Diagnosis Date   Anxiety    Atrial fibrillation ([place])    Chronic   Cataracts, bilateral    to have left eye cataract surgery on 03/21/17    CHF (congestive heart failure), NYHA class II, chronic, diastolic (HCC)    Colon polyps    last colonoscopy 2015 (tubular adenoma)   Depression    GERD (gastroesophageal reflux disease)    H/O: GI bleed    Recurrent  Hemorrhoids    History of BPH    History of kidney stones    hx of x 1  passed stones   Hypertension    Pernicious anemia    Presence of permanent cardiac pacemaker    PVC (premature ventricular contraction)    S/P mitral valve clip implantation 03/25/2021   Successful transcatheter edge-to-edge mitral valve repair with placement of a MitraClip G4 XTW clip positioned A2/P2 and placement of a MitraClip G4 NTW clip positioned on the lateral side of A2/P2, reducing mitral regurgitation from 4+ at baseline to 1-2+ post procedure   Severe mitral regurgitation    Vitamin D deficiency    Past Surgical History:   Procedure Laterality Date   COLONOSCOPY WITH PROPOFOL N/A 03/17/2017   Procedure: COLONOSCOPY WITH PROPOFOL;  Surgeon: Carol Ada, MD;  Location: [facility];  Service: Endoscopy;  Laterality: N/A;   [place]; ~ 2002   left; right   MITRAL VALVE REPAIR N/A 03/25/2021   Procedure: MITRAL VALVE REPAIR;  Surgeon: Sherren Mocha, MD;  Location: [place] [facility];  Service: Cardiovascular;  Laterality: N/A;   MULTIPLE EXTRACTIONS WITH ALVEOLOPLASTY N/A 03/18/2021   Procedure: MULTIPLE EXTRACTION WITH ALVEOLOPLASTY;  Surgeon: Charlaine Dalton, DMD;  Location: [place];  Service: Dentistry;  Laterality: N/A;   PACEMAKER IMPLANT N/A 03/11/2019   Procedure: PACEMAKER IMPLANT;  Surgeon: Constance Haw, MD;  Location: [place] [facility];  Service: Cardiovascular;  Laterality: N/A;   RIGHT/LEFT HEART CATH AND CORONARY ANGIOGRAPHY N/A 02/24/2021   Procedure: RIGHT/LEFT HEART CATH AND CORONARY ANGIOGRAPHY;  Surgeon: Sherren Mocha, MD;  Location: [place] [facility];  Service: Cardiovascular;  Laterality: N/A;   TEE WITHOUT CARDIOVERSION N/A 12/23/2020   Procedure: TRANSESOPHAGEAL ECHOCARDIOGRAM (TEE);  Surgeon: Freada Bergeron, MD;  Location: [facility];  Service: Cardiovascular;  Laterality: N/A;   TEE WITHOUT CARDIOVERSION N/A 03/25/2021   Procedure: TRANSESOPHAGEAL ECHOCARDIOGRAM (TEE);  Surgeon: Sherren Mocha, MD;  Location: [place] [facility];  Service: Cardiovascular;  Laterality: N/A;   TEMPORARY PACEMAKER N/A 03/10/2019   Procedure: TEMPORARY PACEMAKER;  Surgeon: Martinique, Peter M, MD;  Location: [place] [facility];  Service: Cardiovascular;  Laterality: N/A;   Family History  Problem Relation Age of Onset   Stroke Father 58   Cancer Mother    Social History   Socioeconomic History   Marital status: Widowed    Spouse name: Not on file   Number of children: 1   Years of education: Not on file   Highest education level: Not on file   Occupational History   Occupation: Retired  Tobacco Use   Smoking status: Never   Smokeless tobacco: Former    Types: Snuff   Tobacco comments:    03/17/21 "still use snuff daily"  Vaping Use   Vaping Use: Never used  Substance and Sexual Activity   Alcohol use: No   Drug use: No   Sexual activity: Never    Comment: widower, lives with granddaughter and great grandchildren.  Other Topics Concern   Not on file  Social History Narrative   Lives with grand daughter.         Epworth Sleepiness Scale = 7 (as of 09/21/2015)   Social Determinants of Health   Financial Resource Strain: Low Risk    Difficulty of Paying Living Expenses: Not hard at all  Food Insecurity: No Food Insecurity   Worried About Charity fundraiser in the  Last Year: Never true   Ran Out of Food in the Last Year: Never true  Transportation Needs: No Transportation Needs   Lack of Transportation (Medical): No   Lack of Transportation (Non-Medical): No  Physical Activity: Sufficiently Active   Days of Exercise per Week: 5 days   Minutes of Exercise per Session: 30 min  Stress: No Stress Concern Present   Feeling of Stress : Not at all  Social Connections: Socially Isolated   Frequency of Communication with Friends and Family: More than three times a week   Frequency of Social Gatherings with Friends and Family: More than three times a week   Attends Religious Services: Never   Marine scientist or Organizations: No   Attends Archivist Meetings: Never   Marital Status: Widowed    Tobacco Counseling Counseling given: Not Answered Tobacco comments: 03/17/21 "still use snuff daily"   Clinical Intake:  Pre-visit preparation completed: Yes  Pain : 0-10 Pain Score: 5  Pain Type: Chronic pain Pain Location: Back Pain Descriptors / Indicators: Aching Pain Onset: More than a month ago Pain Frequency: Intermittent     BMI - recorded: 30.42 Nutritional Status: BMI > 30   Obese Nutritional Risks: None Diabetes: No  How often do you need to have someone help you when you read instructions, pamphlets, or other written materials from your doctor or pharmacy?: 1 - Never  Diabetic?NO  Interpreter Needed?: No  Information entered by :: MJ Arita Severtson, LPN   Activities of Daily Living In your present state of health, do you have any difficulty performing the following activities: 04/29/2021 03/26/2021  Hearing? N Y  Vision? N Y  Difficulty concentrating or making decisions? N Y  Walking or climbing stairs? N Y  Dressing or bathing? N N  Doing errands, shopping? N Y  Comment - -  Conservation officer, nature and eating ? N -  Using the Toilet? N -  In the past six months, have you accidently leaked urine? N -  Do you have problems with loss of bowel control? N -  Managing your Medications? N -  Managing your Finances? N -  Housekeeping or managing your Housekeeping? N -  Some recent data might be hidden    Patient Care Team: [name], MD as PCP - General (Family Medicine) Minus Breeding, MD as PCP - Cardiology (Cardiology) Edythe Clarity, [facility] as Pharmacist (Pharmacist)  Indicate any recent Medical Services you may have received from other than [facility] providers in the past year (date may be approximate).     Assessment:   This is a routine wellness examination for [place].  Hearing/Vision screen Hearing Screening - Comments:: Hearing issues. Does not wear hearing aids.  Vision Screening - Comments:: Dr. Zigmund Daniel in [place]. Had laser surgery.  Dietary issues and exercise activities discussed: Current Exercise Habits: Home exercise routine, Type of exercise: walking, Time (Minutes): 30, Frequency (Times/Week): 5, Weekly Exercise (Minutes/Week): 150, Intensity: Mild, Exercise limited by: cardiac condition(s)   Goals Addressed             This Visit's Progress    DIET - INCREASE WATER INTAKE         Depression Screen PHQ 2/9 Scores  04/29/2021 04/05/2021 12/08/2020 08/10/2018 10/26/2017 03/27/2014  PHQ - 2 Score 0 0 3 0 0 0  PHQ- 9 Score - - 10 - - -    Fall Risk Fall Risk  04/29/2021 04/05/2021 12/08/2020 08/10/2018 10/26/2017  Falls in the past year?  0 0 1 0 No  Number falls in past yr: 0 0 1 - -  Injury with Fall? 0 0 0 - -  Risk for fall due to : Impaired balance/gait;Impaired vision - History of fall(s);Impaired balance/gait;Impaired mobility;Impaired vision - -  Follow up Falls prevention discussed Falls evaluation completed Falls evaluation completed Falls evaluation completed -    FALL RISK PREVENTION PERTAINING TO THE HOME:  Any stairs in or around the home? Yes  If so, are there any without handrails? No  Home free of loose throw rugs in walkways, pet beds, electrical cords, etc? Yes  Adequate lighting in your home to reduce risk of falls? Yes   ASSISTIVE DEVICES UTILIZED TO PREVENT FALLS:  Life alert? No  Use of a cane, walker or w/c? Yes  Grab bars in the bathroom? Yes  Shower chair or bench in shower? Yes  Elevated toilet seat or a handicapped toilet? No   TIMED UP AND GO:  Was the test performed? Yes .  Length of time to ambulate 10 feet: 12 sec.   Gait slow and steady with assistive device  Cognitive Function:     6CIT Screen 04/29/2021  What Year? 0 points  What month? 0 points  What time? 0 points  Count back from 20 0 points  Months in reverse 0 points  Repeat phrase 2 points  Total Score 2    Immunizations Immunization History  Administered Date(s) Administered   Fluad Quad(high Dose 65+) 03/22/2019, 04/30/2020, 04/05/2021   Influenza Split 05/09/2012   Influenza Whole 04/17/2009   Influenza, High Dose Seasonal PF 04/24/2018   Influenza,inj,Quad PF,6+ Mos 03/22/2013, 03/27/2014, 04/06/2015, 04/07/2016, 03/31/2017   PFIZER(Purple Top)SARS-COV-2 Vaccination 10/18/2019, 11/09/2019, 07/01/2020   Pneumococcal Conjugate-13 12/09/2014   Pneumococcal Polysaccharide-23 04/17/2000   Td  06/17/2002   Tdap 06/29/2015    TDAP status: Up to date  Flu Vaccine status: Up to date  Pneumococcal vaccine status: Due, Education has been provided regarding the importance of this vaccine. Advised may receive this vaccine at local pharmacy or Health Dept. Aware to provide a copy of the vaccination record if obtained from local pharmacy or Health Dept. Verbalized acceptance and understanding.  Covid-19 vaccine status: Information provided on how to obtain vaccines.   Qualifies for Shingles Vaccine? Yes   Zostavax completed No   Shingrix Completed?: No.    Education has been provided regarding the importance of this vaccine. Patient has been advised to call insurance company to determine out of pocket expense if they have not yet received this vaccine. Advised may also receive vaccine at local pharmacy or Health Dept. Verbalized acceptance and understanding.  Screening Tests Health Maintenance  Topic Date Due   Zoster Vaccines- Shingrix (1 of 2) Never done   COVID-19 Vaccine (4 - Booster for Pfizer series) 10/30/2020   TETANUS/TDAP  06/28/2025   INFLUENZA VACCINE  Completed   HPV VACCINES  Aged Out    Health Maintenance  Health Maintenance Due  Topic Date Due   Zoster Vaccines- Shingrix (1 of 2) Never done   COVID-19 Vaccine (4 - Booster for Pfizer series) 10/30/2020    Colorectal cancer screening: Type of screening: Colonoscopy. Completed 03/17/2017. Repeat every 10 years  Lung Cancer Screening: (Low Dose CT Chest recommended if Age 16-80 years, 30 pack-year currently smoking OR have quit w/in 15years.) does not qualify.    Additional Screening:  Hepatitis C Screening: does not qualify;   Vision Screening: Recommended annual ophthalmology exams for early detection of  glaucoma and other disorders of the eye. Is the patient up to date with their annual eye exam?  No  Who is the provider or what is the name of the office in which the patient attends annual eye exams?  Dr.  Zigmund Daniel in [place] If pt is not established with a provider, would they like to be referred to a provider to establish care? No .   Dental Screening: Recommended annual dental exams for proper oral hygiene  Community Resource Referral / Chronic Care Management: CRR required this visit?  No   CCM required this visit?  No      Plan:     I have personally reviewed and noted the following in the patient's chart:   Medical and social history Use of alcohol, tobacco or illicit drugs  Current medications and supplements including opioid prescriptions. Patient is not currently taking opioid prescriptions. Functional ability and status Nutritional status Physical activity Advanced directives List of other physicians Hospitalizations, surgeries, and ER visits in previous 12 months Vitals Screenings to include cognitive, depression, and falls Referrals and appointments  In addition, I have reviewed and discussed with patient certain preventive protocols, quality metrics, and best practice recommendations. A written personalized care plan for preventive services as well as general preventive health recommendations were provided to patient.     Chriss Driver, LPN   [id number]/23/[id number]   Nurse Notes: Pt states he is doing well. Declines referral to Opthalmology. Discussed Shingles vaccine.

## 2021-05-10 ENCOUNTER — Telehealth: Payer: Self-pay

## 2021-05-10 MED ORDER — POTASSIUM CHLORIDE CRYS ER 10 MEQ PO TBCR: 10.0000 meq | EXTENDED_RELEASE_TABLET | Freq: Every day | ORAL | 3 refills | Status: DC

## 2021-05-10 NOTE — Telephone Encounter (Signed)
Prescription sent to pharmacy.

## 2021-05-10 NOTE — Telephone Encounter (Signed)
Pt called in requesting a refilll of potassium chloride (KLOR-CON) 10  be sent to [facility] mail in pharmacy.   Cb#: [phone]

## 2021-05-13 ENCOUNTER — Other Ambulatory Visit: Payer: Self-pay

## 2021-05-13 ENCOUNTER — Encounter: Payer: Self-pay | Admitting: Cardiology

## 2021-05-13 ENCOUNTER — Ambulatory Visit: Payer: Medicare HMO | Admitting: Cardiology

## 2021-05-13 VITALS — BP 122/68 | HR 72 | Ht 70.0 in | Wt 211.8 lb

## 2021-05-13 DIAGNOSIS — I442 Atrioventricular block, complete: Secondary | ICD-10-CM | POA: Diagnosis not present

## 2021-05-13 NOTE — Progress Notes (Signed)
Electrophysiology Office Note   Date:  05/13/2021   ID:  Austin Raatz., DOB 27-Feb-1932, MRN [id number]  PCP:  [name], MD  Cardiologist:  [place] Primary Electrophysiologist:  Austin Dismuke Meredith Leeds, MD    Chief Complaint: Complete heart block   History of Present Illness: Austin Bartoli. is a 87 y.o. male who is being seen today for the evaluation of complete heart block at the request of [name], MD. Presenting today for electrophysiology evaluation.  He has a history significant for diastolic heart failure, permanent atrial fibrillation, hypertension, mitral regurgitation.  He presented to the hospital and had a [place] dual-chamber pacemaker implanted 03/11/2019 for complete heart block.  He also has a history of severe mitral regurgitation and is now status post mitral clip.  Today, denies symptoms of palpitations, chest pain, shortness of breath, orthopnea, PND, lower extremity edema, claudication, dizziness, presyncope, syncope, bleeding, or neurologic sequela. The patient is tolerating medications without difficulties.  Since being seen he has done well.  He has no chest pain or shortness of breath.  Is able to do all of his daily activities.  He has been having some blurry vision.  He feels that his blurry vision is due to his increased dose of Coreg.   Past Medical History:  Diagnosis Date   Anxiety    Atrial fibrillation ([place])    Chronic   Cataracts, bilateral    to have left eye cataract surgery on 03/21/17    CHF (congestive heart failure), NYHA class II, chronic, diastolic (HCC)    Colon polyps    last colonoscopy 2015 (tubular adenoma)   Depression    GERD (gastroesophageal reflux disease)    H/O: GI bleed    Recurrent   Hemorrhoids    History of BPH    History of kidney stones    hx of x 1  passed stones   Hypertension    Pernicious anemia    Presence of permanent cardiac pacemaker    PVC (premature ventricular contraction)     S/P mitral valve clip implantation 03/25/2021   Successful transcatheter edge-to-edge mitral valve repair with placement of a MitraClip G4 XTW clip positioned A2/P2 and placement of a MitraClip G4 NTW clip positioned on the lateral side of A2/P2, reducing mitral regurgitation from 4+ at baseline to 1-2+ post procedure   Severe mitral regurgitation    Vitamin D deficiency    Past Surgical History:  Procedure Laterality Date   COLONOSCOPY WITH PROPOFOL N/A 03/17/2017   Procedure: COLONOSCOPY WITH PROPOFOL;  Surgeon: Carol Ada, MD;  Location: [facility];  Service: Endoscopy;  Laterality: N/A;   [place]; ~ 2002   left; right   MITRAL VALVE REPAIR N/A 03/25/2021   Procedure: MITRAL VALVE REPAIR;  Surgeon: Sherren Mocha, MD;  Location: [place] [facility];  Service: Cardiovascular;  Laterality: N/A;   MULTIPLE EXTRACTIONS WITH ALVEOLOPLASTY N/A 03/18/2021   Procedure: MULTIPLE EXTRACTION WITH ALVEOLOPLASTY;  Surgeon: Charlaine Dalton, DMD;  Location: [place];  Service: Dentistry;  Laterality: N/A;   PACEMAKER IMPLANT N/A 03/11/2019   Procedure: PACEMAKER IMPLANT;  Surgeon: Constance Haw, MD;  Location: [place] [facility];  Service: Cardiovascular;  Laterality: N/A;   RIGHT/LEFT HEART CATH AND CORONARY ANGIOGRAPHY N/A 02/24/2021   Procedure: RIGHT/LEFT HEART CATH AND CORONARY ANGIOGRAPHY;  Surgeon: Sherren Mocha, MD;  Location: [place] [facility];  Service: Cardiovascular;  Laterality: N/A;  TEE WITHOUT CARDIOVERSION N/A 12/23/2020   Procedure: TRANSESOPHAGEAL ECHOCARDIOGRAM (TEE);  Surgeon: Freada Bergeron, MD;  Location: [facility];  Service: Cardiovascular;  Laterality: N/A;   TEE WITHOUT CARDIOVERSION N/A 03/25/2021   Procedure: TRANSESOPHAGEAL ECHOCARDIOGRAM (TEE);  Surgeon: Sherren Mocha, MD;  Location: [place] [facility];  Service: Cardiovascular;  Laterality: N/A;   TEMPORARY PACEMAKER N/A 03/10/2019   Procedure: TEMPORARY  PACEMAKER;  Surgeon: Martinique, Peter M, MD;  Location: [place] [facility];  Service: Cardiovascular;  Laterality: N/A;     Current Outpatient Medications  Medication Sig Dispense Refill   acetaminophen (TYLENOL) 500 MG tablet Take 500 mg by mouth every 6 (six) hours as needed for mild pain, fever or headache.     albuterol (VENTOLIN HFA) 108 (90 Base) MCG/ACT inhaler INHALE 2 PUFFS EVERY 6 HOURS AS NEEDED FOR WHEEZING OR SHORTNESS OF BREATH 18 g 3   amLODipine (NORVASC) 10 MG tablet Take 1 tablet (10 mg total) by mouth daily. 90 tablet 3   amoxicillin (AMOXIL) 500 MG capsule Take 4 capsules (2,000 mg total) by mouth as directed. Take 4 tablets 1 hour prior to dental work, including cleanings. 12 capsule 12   carvedilol (COREG) 25 MG tablet TAKE 1 TABLET TWICE DAILY 180 tablet 2   ferrous sulfate 325 (65 FE) MG tablet Take 325 mg by mouth every other day.      fluticasone (FLONASE) 50 MCG/ACT nasal spray USE 1 SPRAY IN EACH NOSTRIL EVERY DAY 32 g 1   furosemide (LASIX) 40 MG tablet Take 1 tablet (40 mg total) by mouth 2 (two) times daily. 270 tablet 1   guaiFENesin-dextromethorphan (ROBITUSSIN DM) 100-10 MG/5ML syrup Take 5 mLs by mouth every 4 (four) hours as needed for cough.     losartan (COZAAR) 50 MG tablet TAKE 1 TABLET EVERY DAY 90 tablet 3   Multiple Vitamin (MULTIVITAMIN) tablet Take 1 tablet by mouth daily.     multivitamin-lutein (OCUVITE-LUTEIN) CAPS Take 1 capsule by mouth daily.     polyvinyl alcohol (LIQUIFILM TEARS) 1.4 % ophthalmic solution Place 1 drop into both eyes as needed for dry eyes.     potassium chloride (KLOR-CON) 10 MEQ tablet Take 1 tablet (10 mEq total) by mouth daily. 90 tablet 3   vitamin B-12 (CYANOCOBALAMIN) 1000 MCG tablet TAKE 1 TABLET EVERY DAY 90 tablet 3   XARELTO 20 MG TABS tablet TAKE 1 TABLET EVERY DAY WITH SUPPER 90 tablet 3   No current facility-administered medications for this visit.    Allergies:   Patient has no known allergies.   Social  History:  The patient  reports that he has never smoked. He has quit using smokeless tobacco.  His smokeless tobacco use included snuff. He reports that he does not drink alcohol and does not use drugs.   Family History:  The patient's family history includes Cancer in his mother; Stroke (age of onset: 70) in his father.   ROS:  Please see the history of present illness.   Otherwise, review of systems is positive for none.   All other systems are reviewed and negative.   PHYSICAL EXAM: VS:  BP 122/68   Pulse 72   Ht 5\' 10"  (1.778 m)   Wt 211 lb 12.8 oz (96.1 kg)   SpO2 98%   BMI 30.39 kg/m  , BMI Body mass index is 30.39 kg/m. GEN: Well nourished, well developed, in no acute distress  HEENT: normal  Neck: no JVD, carotid bruits, or masses Cardiac: RRR;  no murmurs, rubs, or gallops,no edema  Respiratory:  clear to auscultation bilaterally, normal work of breathing GI: soft, nontender, nondistended, + BS MS: no deformity or atrophy  Skin: warm and dry, device site well healed Neuro:  Strength and sensation are intact Psych: euthymic mood, full affect  EKG:  EKG is ordered today. Personal review of the ekg ordered shows AF, V paced  Personal review of the device interrogation today. Results in [place]: 03/23/2021: ALT 25 04/05/2021: BUN 12; Creat 0.98; Hemoglobin 11.9; Platelets 138; Potassium 4.2; Sodium 139    Lipid Panel     Component Value Date/Time   CHOL 179 12/01/2020 0808   TRIG 58 12/01/2020 0808   HDL 62 12/01/2020 0808   CHOLHDL 2.9 12/01/2020 0808   VLDL 11 04/08/2016 0823   LDLCALC 103 (H) 12/01/2020 0808     Wt Readings from Last 3 Encounters:  05/13/21 211 lb 12.8 oz (96.1 kg)  04/29/21 212 lb (96.2 kg)  04/07/21 212 lb (96.2 kg)      Other studies Reviewed: Additional studies/ records that were reviewed today include: TTE 04/21/21  Review of the above records today demonstrates:   1. The LV function is difficult to assess but appears  to be moderately  depressed. . Left ventricular ejection fraction, by estimation, is 30 to  35%. The left ventricle has moderately decreased function. The left  ventricle demonstrates global  hypokinesis. The left ventricular internal cavity size was moderately  dilated. There is mild asymmetric left ventricular hypertrophy. Left  ventricular diastolic parameters are consistent with Grade III diastolic  dysfunction (restrictive). Elevated left  ventricular end-diastolic pressure.   2. Right ventricular systolic function is normal. The right ventricular  size is moderately enlarged. There is normal pulmonary artery systolic  pressure.   3. Left atrial size was severely dilated.   4. Right atrial size was severely dilated.   5. The mitral valve gradient has . The mitral valve has been  repaired/replaced. Mild to moderate mitral valve regurgitation. Mild to  moderate mitral stenosis.   6. Tricuspid valve regurgitation is moderate.   7. The aortic valve is normal in structure. There is mild calcification  of the aortic valve. Aortic valve regurgitation is mild.    ASSESSMENT AND PLAN:  1.  Complete heart block: Status post [place] [employer] dual-chamber pacemaker implanted 03/11/2019.  Device functioning appropriately.  No changes at this time.  2.  Permanent atrial fibrillation: Currently on Xarelto 20 mg daily and carvedilol 25 mg twice daily for rate control.  CHA2DS2-VASc of 3.  3.  Hypertension: He is having some blurry vision.  His blood pressure is well controlled.  I have told him to decrease his carvedilol to 12.5 mg as he thinks that this is contributing.  If his blurry vision does not improve over the next week, he Austin Gray go back to 25 mg.  4.  Nonrheumatic mitral valve regurgitation: Status post mitral clip.  Plan per primary cardiology.  Current medicines are reviewed at length with the patient today.   The patient does not have concerns regarding his medicines.  The following changes  were made today: None  Labs/ tests ordered today include:  No orders of the defined types were placed in this encounter.    Disposition:   FU with Austin Gray 12 months  Signed, Austin Barren Meredith Leeds, MD  05/13/2021 10:38 AM     [facility] [address] [place] [address] (  [phone] (office) [phone] (fax)

## 2021-05-24 ENCOUNTER — Ambulatory Visit: Payer: Medicare HMO | Admitting: Cardiology

## 2021-06-23 ENCOUNTER — Ambulatory Visit (INDEPENDENT_AMBULATORY_CARE_PROVIDER_SITE_OTHER): Payer: Medicare HMO

## 2021-06-23 DIAGNOSIS — I442 Atrioventricular block, complete: Secondary | ICD-10-CM

## 2021-06-23 LAB — CUP PACEART REMOTE DEVICE CHECK
Battery Remaining Longevity: 103 mo
Battery Remaining Percentage: 83 %
Battery Voltage: 3.01 V
Brady Statistic RV Percent Paced: 97 %
Date Time Interrogation Session: 20221207020015
Implantable Lead Implant Date: 20200824
Implantable Lead Location: 753860
Implantable Pulse Generator Implant Date: 20200824
Lead Channel Impedance Value: 440 Ohm
Lead Channel Pacing Threshold Amplitude: 1 V
Lead Channel Pacing Threshold Pulse Width: 0.5 ms
Lead Channel Sensing Intrinsic Amplitude: 10.4 mV
Lead Channel Setting Pacing Amplitude: 1.25 V
Lead Channel Setting Pacing Pulse Width: 0.5 ms
Lead Channel Setting Sensing Sensitivity: 0.7 mV
Pulse Gen Model: 1272
Pulse Gen Serial Number: 9140133

## 2021-07-02 NOTE — Progress Notes (Signed)
Remote pacemaker transmission.   

## 2021-07-19 ENCOUNTER — Other Ambulatory Visit: Payer: Self-pay | Admitting: Family Medicine

## 2021-09-21 ENCOUNTER — Ambulatory Visit: Payer: Medicare HMO | Admitting: Cardiology

## 2021-09-22 ENCOUNTER — Ambulatory Visit (INDEPENDENT_AMBULATORY_CARE_PROVIDER_SITE_OTHER): Payer: Medicare HMO

## 2021-09-22 DIAGNOSIS — I442 Atrioventricular block, complete: Secondary | ICD-10-CM

## 2021-09-22 LAB — CUP PACEART REMOTE DEVICE CHECK
Battery Remaining Longevity: 100 mo
Battery Remaining Percentage: 81 %
Battery Voltage: 3.01 V
Brady Statistic RV Percent Paced: 97 %
Date Time Interrogation Session: 20230308020013
Implantable Lead Implant Date: 20200824
Implantable Lead Location: 753860
Implantable Pulse Generator Implant Date: 20200824
Lead Channel Impedance Value: 440 Ohm
Lead Channel Pacing Threshold Amplitude: 1.25 V
Lead Channel Pacing Threshold Pulse Width: 0.5 ms
Lead Channel Sensing Intrinsic Amplitude: 8.2 mV
Lead Channel Setting Pacing Amplitude: 1.5 V
Lead Channel Setting Pacing Pulse Width: 0.5 ms
Lead Channel Setting Sensing Sensitivity: 0.7 mV
Pulse Gen Model: 1272
Pulse Gen Serial Number: 9140133

## 2021-09-23 DIAGNOSIS — I420 Dilated cardiomyopathy: Secondary | ICD-10-CM | POA: Insufficient documentation

## 2021-09-23 NOTE — Progress Notes (Unsigned)
Cardiology Office Note   Date:  09/23/2021   ID:  Austin Mesi., DOB 06/14/1932, MRN [id number]  PCP:  [name], MD  Cardiologist:   Minus Breeding, MD   No chief complaint on file.     History of Present Illness: Austin Lightcap. is a 88 y.o. male who presents for evaluation of atrial fib Austin acute on chronic systolic Austin diastolic HF.  He was admitted on 09/05/2017 after he presented to his PCPs office with bradycardia Austin symptoms of volume overload.  Initial lab work also suggested acute kidney injury with creatinine elevation up to 1.5.  His heart rate was in the 30s in the ER.  His beta-blocker was held during the admission.  His ACE inhibitor was discontinued.  Echocardiogram obtained on 09/06/2017 showed mild LVH, EF 55-60%, moderate MR, moderate TR, PA peak pressure 36 mmHg.  He had normalization of the renal function by 09/15/2017.  He had CHB Austin a permanent pacemaker in August of 2020.  Subsequent TEE 12/23/20 showed EF 55%, mild RV dysfunction/moderate enlargement. RV pacing wire is present. RVSP is 69mHg+RAP. Severe biatrial enlargement. Severe TR (suspected to be functional due to severe annular dilation). 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 EROA 0.4cm2 with RVol 73mby PISA. No evidence of mitral stenosis. He underwent successful transcatheter edge-to-edge mitral valve repair with placement of a MitraClip G4 XTW clip in the A2/P2 Austin placement of a MitraClip G4 NTW positioned in the lateral side of A2/P2 reducing MR from 4+ at baseline to a 1-2+ in the post procedural setting.  Follow up EF was 35%.  ***    ***    ***  I sent him for a repeat echo last month Austin he had severe MR.   He comes back today to discuss this.  He probably would not want an open chest procedure but would like to know his options.  His shortness of breath  happens when he is having lumbar back pain Austin trying to walk.  He is not describing PND or orthopnea.  He is not really noticing palpitations, presyncope or syncope.  He is walking with a walker because of his back pain.   Past Medical History:  Diagnosis Date   Anxiety    Atrial fibrillation (HCC)    Chronic   Cataracts, bilateral    to have left eye cataract surgery on 03/21/17    CHF (congestive heart failure), NYHA class II, chronic, diastolic (HCC)    Colon polyps    last colonoscopy 2015 (tubular adenoma)   Depression    GERD (gastroesophageal reflux disease)    H/O: GI bleed    Recurrent   Hemorrhoids    History of BPH    History of kidney stones    hx of x 1  passed stones   Hypertension    Pernicious anemia    Presence of permanent cardiac pacemaker    PVC (premature ventricular contraction)    S/P mitral valve clip implantation 03/25/2021   Successful transcatheter edge-to-edge mitral valve repair with placement of a MitraClip G4 XTW clip positioned A2/P2 Austin placement of a MitraClip G4 NTW clip positioned on the lateral side of A2/P2, reducing mitral regurgitation from 4+ at baseline to 1-2+ post procedure   Severe mitral regurgitation    Vitamin D deficiency     Past Surgical History:  Procedure Laterality Date   COLONOSCOPY WITH PROPOFOL N/A 03/17/2017   Procedure: COLONOSCOPY WITH PROPOFOL;  Surgeon: Carol Ada, MD;  Location: [facility];  Service: Endoscopy;  Laterality: N/A;   [place]; ~ 2002   left; right   MITRAL VALVE REPAIR N/A 03/25/2021   Procedure: MITRAL VALVE REPAIR;  Surgeon: Sherren Mocha, MD;  Location: [place] [facility];  Service: Cardiovascular;  Laterality: N/A;   MULTIPLE EXTRACTIONS WITH ALVEOLOPLASTY N/A 03/18/2021   Procedure: MULTIPLE EXTRACTION WITH ALVEOLOPLASTY;  Surgeon: Charlaine Dalton, DMD;  Location: [place];  Service: Dentistry;  Laterality: N/A;   PACEMAKER IMPLANT N/A 03/11/2019    Procedure: PACEMAKER IMPLANT;  Surgeon: Constance Haw, MD;  Location: [place] [facility];  Service: Cardiovascular;  Laterality: N/A;   RIGHT/LEFT HEART CATH [place] CORONARY ANGIOGRAPHY N/A 02/24/2021   Procedure: RIGHT/LEFT HEART CATH Austin CORONARY ANGIOGRAPHY;  Surgeon: Sherren Mocha, MD;  Location: [place] [facility];  Service: Cardiovascular;  Laterality: N/A;   TEE WITHOUT CARDIOVERSION N/A 12/23/2020   Procedure: TRANSESOPHAGEAL ECHOCARDIOGRAM (TEE);  Surgeon: Freada Bergeron, MD;  Location: [facility];  Service: Cardiovascular;  Laterality: N/A;   TEE WITHOUT CARDIOVERSION N/A 03/25/2021   Procedure: TRANSESOPHAGEAL ECHOCARDIOGRAM (TEE);  Surgeon: Sherren Mocha, MD;  Location: [place] [facility];  Service: Cardiovascular;  Laterality: N/A;   TEMPORARY PACEMAKER N/A 03/10/2019   Procedure: TEMPORARY PACEMAKER;  Surgeon: Martinique, Peter M, MD;  Location: [place] [facility];  Service: Cardiovascular;  Laterality: N/A;     Current Outpatient Medications  Medication Sig Dispense Refill   acetaminophen (TYLENOL) 500 MG tablet Take 500 mg by mouth every 6 (six) hours as needed for mild pain, fever or headache.     albuterol (VENTOLIN HFA) 108 (90 Base) MCG/ACT inhaler INHALE 2 PUFFS EVERY 6 HOURS AS NEEDED FOR WHEEZING OR SHORTNESS OF BREATH 18 g 3   amLODipine (NORVASC) 10 MG tablet Take 1 tablet (10 mg total) by mouth daily. 90 tablet 3   amoxicillin (AMOXIL) 500 MG capsule Take 4 capsules (2,000 mg total) by mouth as directed. Take 4 tablets 1 hour prior to dental work, including cleanings. 12 capsule 12   carvedilol (COREG) 25 MG tablet TAKE 1 TABLET TWICE DAILY 180 tablet 2   ferrous sulfate 325 (65 FE) MG tablet Take 325 mg by mouth every other day.      fluticasone (FLONASE) 50 MCG/ACT nasal spray USE 1 SPRAY IN EACH NOSTRIL EVERY DAY 32 g 1   furosemide (LASIX) 40 MG tablet Take 1 tablet (40 mg total) by mouth 2 (two) times daily. 270 tablet 1   guaiFENesin-dextromethorphan  (ROBITUSSIN DM) 100-10 MG/5ML syrup Take 5 mLs by mouth every 4 (four) hours as needed for cough.     losartan (COZAAR) 50 MG tablet TAKE 1 TABLET EVERY DAY 90 tablet 3   Multiple Vitamin (MULTIVITAMIN) tablet Take 1 tablet by mouth daily.     multivitamin-lutein (OCUVITE-LUTEIN) CAPS Take 1 capsule by mouth daily.     polyvinyl alcohol (LIQUIFILM TEARS) 1.4 % ophthalmic solution Place 1 drop into both eyes as needed for dry eyes.     potassium chloride (KLOR-CON) 10 MEQ tablet Take 1 tablet (10 mEq total) by mouth daily. 90 tablet 3   vitamin B-12 (CYANOCOBALAMIN) 1000 MCG tablet TAKE 1 TABLET EVERY DAY 90 tablet 3   XARELTO 20 MG TABS tablet TAKE 1 TABLET EVERY DAY WITH SUPPER 90 tablet 3  No current facility-administered medications for this visit.    Allergies:   Patient has no known allergies.    ROS:  Please see the history of present illness.   Otherwise, review of systems are positive for ***.   All other systems are reviewed Austin negative.    PHYSICAL EXAM: VS:  There were no vitals taken for this visit. , BMI There is no height or weight on file to calculate BMI.  GENERAL:  Well appearing NECK:  No jugular venous distention, waveform within normal limits, carotid upstroke brisk Austin symmetric, no bruits, no thyromegaly LUNGS:  Clear to auscultation bilaterally CHEST:  Well healed pacemaker pocket.   HEART:  PMI not displaced or sustained,S1 Austin S2 within normal limits, no S3,  no clicks, no rubs, *** murmurs ABD:  Flat, positive bowel sounds normal in frequency in pitch, no bruits, no rebound, no guarding, no midline pulsatile mass, no hepatomegaly, no splenomegaly EXT:  2 plus pulses throughout, no edema, no cyanosis no clubbing     ***GENERAL:  Well appearing NECK:  No jugular venous distention, waveform within normal limits, carotid upstroke brisk Austin symmetric, no bruits, no thyromegaly LUNGS:  Clear to auscultation bilaterally CHEST:  Unremarkable HEART:  PMI not  displaced or sustained,S1 Austin S2 within normal limits, no S3, no S4, no clicks, no rubs, 3 out of 6 holosystolic murmur heard at the apex Austin axilla, no diastolic murmurs ABD:  Flat, positive bowel sounds normal in frequency in pitch, no bruits, no rebound, no guarding, no midline pulsatile mass, no hepatomegaly, no splenomegaly EXT:  2 plus pulses throughout, no edema, no cyanosis no clubbing   EKG:  EKG is *** ordered today. ***  Recent Labs: 03/23/2021: ALT 25 04/05/2021: BUN 12; Creat 0.98; Hemoglobin 11.9; Platelets 138; Potassium 4.2; Sodium 139    Lipid Panel    Component Value Date/Time   CHOL 179 12/01/2020 0808   TRIG 58 12/01/2020 0808   HDL 62 12/01/2020 0808   CHOLHDL 2.9 12/01/2020 0808   VLDL 11 04/08/2016 0823   LDLCALC 103 (H) 12/01/2020 0808      Wt Readings from Last 3 Encounters:  05/13/21 211 lb 12.8 oz (96.1 kg)  04/29/21 212 lb (96.2 kg)  04/07/21 212 lb (96.2 kg)      Other studies Reviewed: Additional studies/ records that were reviewed today include: *** Review of the above records demonstrates: See elsewhere   ASSESSMENT Austin PLAN:  ATRIAL FIB:    CHA2DS2-VASc Score Austin unadjusted Ischemic Stroke Rate (% per year) is equal to 3.2 % stroke rate/year from a score of 3.   ***  He tolerates anticoagulation.  No change in therapy.  MR:    He is status post MitraClip.  ***  This is now severe Austin he needs a TEE.  We had a long discussion about this today.  He is very clear Austin understands.  He would like to have more information about the options Austin so does consent to the TEE.  CHB/PERMANENT PACEMAKER:   He is up to date with follow up.  ***   He had normal device function at the last interrogation.    EDEMA:   ***  This is mild to moderate Austin chronic.  No change in therapy.   HTN:   The blood pressure is  *** at target.  No change in therapy.  CARDIOMYOPATHY:  EF was 35%.  ***     Current medicines are reviewed at length with the  patient today.   The patient does not have concerns regarding medicines.  The following changes have been made: ***   Labs/ tests ordered today include:  ***  No orders of the defined types were placed in this encounter.    Disposition:   FU with me in ***   Signed, Minus Breeding, MD  09/23/2021 8:20 PM    [facility]

## 2021-09-24 ENCOUNTER — Ambulatory Visit: Payer: Medicare HMO | Admitting: Cardiology

## 2021-09-24 DIAGNOSIS — I340 Nonrheumatic mitral (valve) insufficiency: Secondary | ICD-10-CM

## 2021-09-24 DIAGNOSIS — I482 Chronic atrial fibrillation, unspecified: Secondary | ICD-10-CM

## 2021-09-24 DIAGNOSIS — I10 Essential (primary) hypertension: Secondary | ICD-10-CM

## 2021-09-24 DIAGNOSIS — I420 Dilated cardiomyopathy: Secondary | ICD-10-CM

## 2021-09-27 ENCOUNTER — Other Ambulatory Visit: Payer: Self-pay | Admitting: Family Medicine

## 2021-09-29 ENCOUNTER — Encounter: Payer: Self-pay | Admitting: Cardiology

## 2021-10-06 NOTE — Progress Notes (Signed)
Remote pacemaker transmission.   

## 2021-11-14 NOTE — Progress Notes (Signed)
?  ?Cardiology Office Note ? ? ?Date:  11/15/2021  ? ?ID:  Austin Mesi., DOB 04-15-1932, MRN [id number] ? ?PCP:  [name], MD  ?Cardiologist:   Minus Breeding, MD  ?APP:    ? ? ?Chief Complaint  ?Patient presents with  ? Edema  ? ? ?  ?History of Present Illness: ?Austin Delbuono. is a 88 y.o. male who presents for evaluation of atrial fib and acute on chronic systolic and diastolic HF.  He was admitted on 09/05/2017 after he presented to his PCPs office with bradycardia and symptoms of volume overload.  Initial lab work also suggested acute kidney injury with creatinine elevation up to 1.5.  His heart rate was in the 30s in the ER.  His beta-blocker was held during the admission.  His ACE inhibitor was discontinued.  Echocardiogram obtained on 09/06/2017 showed mild LVH, EF 55-60%, moderate MR, moderate TR, PA peak pressure 36 mmHg.  He had normalization of the renal function by 09/15/2017.  He had CHB and a permanent pacemaker in August of 2020.  He had successful transcatheter edge-to-edge mitral valve repair with a MitraClip G4.   ? ?Since I last saw him he has done well.  He has some vision trouble.  He has some balance issues.  Gets around with a walker.  He comes today with a caregiver but apparently he lives by himself.  Granddaughter checks on him.  The patient denies any new symptoms such as chest discomfort, neck or arm discomfort. There has been no new shortness of breath, PND or orthopnea. There have been no reported palpitations, presyncope or syncope.  ? ? ? ? ?Past Medical History:  ?Diagnosis Date  ? Anxiety   ? Atrial fibrillation ([place])   ? Chronic  ? Cataracts, bilateral   ? to have left eye cataract surgery on 03/21/17   ? CHF (congestive heart failure), NYHA class II, chronic, diastolic (HCC)   ? Colon polyps   ? last colonoscopy 2015 (tubular adenoma)  ? Depression   ? GERD (gastroesophageal reflux disease)   ? H/O: GI bleed   ? Recurrent  ? Hemorrhoids   ? History of BPH   ?  History of kidney stones   ? hx of x 1  passed stones  ? Hypertension   ? Pernicious anemia   ? Presence of permanent cardiac pacemaker   ? PVC (premature ventricular contraction)   ? S/P mitral valve clip implantation 03/25/2021  ? Successful transcatheter edge-to-edge mitral valve repair with placement of a MitraClip G4 XTW clip positioned A2/P2 and placement of a MitraClip G4 NTW clip positioned on the lateral side of A2/P2, reducing mitral regurgitation from 4+ at baseline to 1-2+ post procedure  ? Severe mitral regurgitation   ? Vitamin D deficiency   ? ? ?Past Surgical History:  ?Procedure Laterality Date  ? COLONOSCOPY WITH PROPOFOL N/A 03/17/2017  ? Procedure: COLONOSCOPY WITH PROPOFOL;  Surgeon: Carol Ada, MD;  Location: [facility];  Service: Endoscopy;  Laterality: N/A;  ? EYE SURGERY    ? [place]; ~ 2002  ? left; right  ? MITRAL VALVE REPAIR N/A 03/25/2021  ? Procedure: MITRAL VALVE REPAIR;  Surgeon: Sherren Mocha, MD;  Location: [place] [facility];  Service: Cardiovascular;  Laterality: N/A;  ? MULTIPLE EXTRACTIONS WITH ALVEOLOPLASTY N/A 03/18/2021  ? Procedure: MULTIPLE EXTRACTION WITH ALVEOLOPLASTY;  Surgeon: Charlaine Dalton, DMD;  Location: [place];  Service: Dentistry;  Laterality: N/A;  ?  PACEMAKER IMPLANT N/A 03/11/2019  ? Procedure: PACEMAKER IMPLANT;  Surgeon: Constance Haw, MD;  Location: [place] [facility];  Service: Cardiovascular;  Laterality: N/A;  ? RIGHT/LEFT HEART CATH AND CORONARY ANGIOGRAPHY N/A 02/24/2021  ? Procedure: RIGHT/LEFT HEART CATH AND CORONARY ANGIOGRAPHY;  Surgeon: Sherren Mocha, MD;  Location: [place] [facility];  Service: Cardiovascular;  Laterality: N/A;  ? TEE WITHOUT CARDIOVERSION N/A 12/23/2020  ? Procedure: TRANSESOPHAGEAL ECHOCARDIOGRAM (TEE);  Surgeon: Freada Bergeron, MD;  Location: [facility];  Service: Cardiovascular;  Laterality: N/A;  ? TEE WITHOUT CARDIOVERSION N/A 03/25/2021  ? Procedure: TRANSESOPHAGEAL ECHOCARDIOGRAM  (TEE);  Surgeon: Sherren Mocha, MD;  Location: [place] [facility];  Service: Cardiovascular;  Laterality: N/A;  ? TEMPORARY PACEMAKER N/A 03/10/2019  ? Procedure: TEMPORARY PACEMAKER;  Surgeon: Martinique, Peter M, MD;  Location: [place] [facility];  Service: Cardiovascular;  Laterality: N/A;  ? ? ? ?Current Outpatient Medications  ?Medication Sig Dispense Refill  ? acetaminophen (TYLENOL) 500 MG tablet Take 500 mg by mouth every 6 (six) hours as needed for mild pain, fever or headache.    ? albuterol (VENTOLIN HFA) 108 (90 Base) MCG/ACT inhaler INHALE 2 PUFFS EVERY 6 HOURS AS NEEDED FOR WHEEZING OR SHORTNESS OF BREATH 18 g 3  ? amLODipine (NORVASC) 10 MG tablet Take 1 tablet (10 mg total) by mouth daily. 90 tablet 3  ? amoxicillin (AMOXIL) 500 MG capsule Take 4 capsules (2,000 mg total) by mouth as directed. Take 4 tablets 1 hour prior to dental work, including cleanings. 12 capsule 12  ? carvedilol (COREG) 25 MG tablet TAKE 1 TABLET TWICE DAILY 180 tablet 2  ? ferrous sulfate 325 (65 FE) MG tablet Take 325 mg by mouth every other day.     ? fluticasone (FLONASE) 50 MCG/ACT nasal spray USE 1 SPRAY IN EACH NOSTRIL EVERY DAY 32 g 1  ? furosemide (LASIX) 40 MG tablet Take 1 tablet (40 mg total) by mouth 2 (two) times daily. 270 tablet 1  ? guaiFENesin-dextromethorphan (ROBITUSSIN DM) 100-10 MG/5ML syrup Take 5 mLs by mouth every 4 (four) hours as needed for cough.    ? Multiple Vitamin (MULTIVITAMIN) tablet Take 1 tablet by mouth daily.    ? multivitamin-lutein (OCUVITE-LUTEIN) CAPS Take 1 capsule by mouth daily.    ? polyvinyl alcohol (LIQUIFILM TEARS) 1.4 % ophthalmic solution Place 1 drop into both eyes as needed for dry eyes.    ? potassium chloride (KLOR-CON) 10 MEQ tablet Take 1 tablet (10 mEq total) by mouth daily. 90 tablet 3  ? sacubitril-valsartan (ENTRESTO) 49-51 MG Take 1 tablet by mouth 2 (two) times daily. 60 tablet 11  ? vitamin B-12 (CYANOCOBALAMIN) 1000 MCG tablet TAKE 1 TABLET EVERY DAY 90 tablet 3  ?  XARELTO 20 MG TABS tablet TAKE 1 TABLET EVERY DAY WITH SUPPER 90 tablet 3  ? ?No current facility-administered medications for this visit.  ? ? ?Allergies:   Patient has no known allergies.  ? ? ?ROS:  Please see the history of present illness.   Otherwise, review of systems are positive for none.   All other systems are reviewed and negative.  ? ? ?PHYSICAL EXAM: ?VS:  BP 122/60   Pulse 74   Ht '5\' 10"'$  (1.778 m)   Wt 209 lb 3.2 oz (94.9 kg)   SpO2 98%   BMI 30.02 kg/m?  , BMI Body mass index is 30.02 kg/m?.  ?GENERAL:  Well appearing ?NECK:  No jugular venous distention, waveform within normal  limits, carotid upstroke brisk and symmetric, no bruits, no thyromegaly ?LUNGS:  Clear to auscultation bilaterally ?CHEST:  Unremarkable ?HEART:  PMI not displaced or sustained,S1 and S2 within normal limits, no S3, no clicks, no rubs, 3 out of 6 apical systolic murmur radiating to the axilla, murmur ?ABD:  Flat, positive bowel sounds normal in frequency in pitch, no bruits, no rebound, no guarding, no midline pulsatile mass, no hepatomegaly, no splenomegaly ?EXT:  2 plus pulses throughout, no edema, no cyanosis no clubbing ? ? ?EKG:  EKG is not ordered today. ?NA ? ?Recent Labs: ?03/23/2021: ALT 25 ?04/05/2021: BUN 12; Creat 0.98; Hemoglobin 11.9; Platelets 138; Potassium 4.2; Sodium 139  ? ? ?Lipid Panel ?   ?Component Value Date/Time  ? CHOL 179 12/01/2020 0808  ? TRIG 58 12/01/2020 0808  ? HDL 62 12/01/2020 0808  ? CHOLHDL 2.9 12/01/2020 0808  ? VLDL 11 04/08/2016 0823  ? [place] 103 (H) 12/01/2020 6606  ? ?  ? ?Wt Readings from Last 3 Encounters:  ?11/15/21 209 lb 3.2 oz (94.9 kg)  ?05/13/21 211 lb 12.8 oz (96.1 kg)  ?04/29/21 212 lb (96.2 kg)  ?  ? ? ?Other studies Reviewed: ?Additional studies/ records that were reviewed today include: EP and Structural Clinic notes ?Review of the above records demonstrates: See elsewhere ? ? ?ASSESSMENT AND PLAN: ? ?ATRIAL FIB:    CHA2DS2-VASc Score and unadjusted Ischemic Stroke  Rate (% per year) is equal to 3.2 % stroke rate/year from a score of 3.   I will check a CBC and basic metabolic profile today.  He seems to be tolerating anticoagulation.  He does not notice palpitations.  ? ?M

## 2021-11-15 ENCOUNTER — Ambulatory Visit: Payer: Medicare HMO | Admitting: Cardiology

## 2021-11-15 ENCOUNTER — Encounter: Payer: Self-pay | Admitting: Cardiology

## 2021-11-15 VITALS — BP 122/60 | HR 74 | Ht 70.0 in | Wt 209.2 lb

## 2021-11-15 DIAGNOSIS — M7989 Other specified soft tissue disorders: Secondary | ICD-10-CM | POA: Diagnosis not present

## 2021-11-15 DIAGNOSIS — Z9889 Other specified postprocedural states: Secondary | ICD-10-CM | POA: Diagnosis not present

## 2021-11-15 DIAGNOSIS — I482 Chronic atrial fibrillation, unspecified: Secondary | ICD-10-CM

## 2021-11-15 DIAGNOSIS — I10 Essential (primary) hypertension: Secondary | ICD-10-CM

## 2021-11-15 DIAGNOSIS — Z95818 Presence of other cardiac implants and grafts: Secondary | ICD-10-CM

## 2021-11-15 DIAGNOSIS — Z950 Presence of cardiac pacemaker: Secondary | ICD-10-CM

## 2021-11-15 LAB — BASIC METABOLIC PANEL
BUN/Creatinine Ratio: 15 (ref 10–24)
BUN: 12 mg/dL (ref 8–27)
CO2: 28 mmol/L (ref 20–29)
Calcium: 9.5 mg/dL (ref 8.6–10.2)
Chloride: 101 mmol/L (ref 96–106)
Creatinine, Ser: 0.8 mg/dL (ref 0.76–1.27)
Glucose: 121 mg/dL — ABNORMAL HIGH (ref 70–99)
Potassium: 4 mmol/L (ref 3.5–5.2)
Sodium: 139 mmol/L (ref 134–144)
eGFR: 86 mL/min/{1.73_m2} (ref >59)

## 2021-11-15 LAB — CBC
Hematocrit: 39.1 % (ref 37.5–51.0)
Hemoglobin: 13.4 g/dL (ref 13.0–17.7)
MCH: 29.4 pg (ref 26.6–33.0)
MCHC: 34.3 g/dL (ref 31.5–35.7)
MCV: 86 fL (ref 79–97)
Platelets: 130 10*3/uL — ABNORMAL LOW (ref 150–450)
RBC: 4.56 x10E6/uL (ref 4.14–5.80)
RDW: 12.8 % (ref 11.6–15.4)
WBC: 6.6 10*3/uL (ref 3.4–10.8)

## 2021-11-15 MED ORDER — SACUBITRIL-VALSARTAN 49-51 MG PO TABS: 1.0000 | ORAL_TABLET | Freq: Two times a day (BID) | ORAL | 11 refills | Status: DC

## 2021-11-15 NOTE — Patient Instructions (Signed)
Medication Instructions:  ? ?STOP LOSARTAN ? ?START ENTRESTO 49/51 MG ONE TABLET TWICE DAILY ? ?*If you need a refill on your cardiac medications before your next appointment, please call your pharmacy* ? ? ?Follow-Up: ?At [facility], you and your health needs are our priority.  As part of our continuing mission to provide you with exceptional heart care, we have created designated Provider Care Teams.  These Care Teams include your primary Cardiologist (physician) and Advanced Practice Providers (APPs -  Physician Assistants and Nurse Practitioners) who all work together to provide you with the care you need, when you need it. ? ?We recommend signing up for the patient portal called "MyChart".  Sign up information is provided on this After Visit Summary.  MyChart is used to connect with patients for Virtual Visits (Telemedicine).  Patients are able to view lab/test results, encounter notes, upcoming appointments, etc.  Non-urgent messages can be sent to your provider as well.   ?To learn more about what you can do with MyChart, go to [email or web address].   ? ?Your next appointment:   ?1 month(s) ? ?The format for your next appointment:   ?In Person ? ?Provider:   ?Diona Browner NP ? ? ?Important Information About Sugar ? ? ? ? ?  ?

## 2021-11-16 ENCOUNTER — Encounter: Payer: Self-pay | Admitting: *Deleted

## 2021-11-23 ENCOUNTER — Other Ambulatory Visit: Payer: Self-pay | Admitting: Family Medicine

## 2021-11-24 NOTE — Telephone Encounter (Signed)
Requested Prescriptions  ?Pending Prescriptions Disp Refills  ?? fluticasone (FLONASE) 50 MCG/ACT nasal spray [Pharmacy Med Name: FLUTICASONE PROPIONATE 50 MCG/ACT Suspension] 32 g 1  ?  Sig: USE 1 SPRAY IN EACH NOSTRIL EVERY DAY  ?  ? Ear, Nose, and Throat: Nasal Preparations - Corticosteroids Passed - 11/23/2021  2:26 AM  ?  ?  Passed - Valid encounter within last 12 months  ?  Recent Outpatient Visits   ?      ? 7 months ago Need for influenza vaccination  ? [facility] [place], Cammie Mcgee, MD  ? 9 months ago Skin lesion of back  ? [facility] [place], Cammie Mcgee, MD  ? 11 months ago Essential hypertension  ? [facility] [place], Cammie Mcgee, MD  ? 1 year ago Heart block AV complete [facility])  ? [facility] [place], Cammie Mcgee, MD  ? 2 years ago Benign essential HTN  ? [facility] [place], Cammie Mcgee, MD  ?  ?  ?Future Appointments   ?        ? In 3 weeks Monge, Helane Gunther, NP [facility]  ?  ? ?  ?  ?  ? ?

## 2021-12-02 ENCOUNTER — Other Ambulatory Visit: Payer: Self-pay

## 2021-12-02 DIAGNOSIS — R0600 Dyspnea, unspecified: Secondary | ICD-10-CM

## 2021-12-02 MED ORDER — ALBUTEROL SULFATE HFA 108 (90 BASE) MCG/ACT IN AERS: INHALATION_SPRAY | RESPIRATORY_TRACT | 3 refills | Status: DC

## 2021-12-16 NOTE — Progress Notes (Unsigned)
Office Visit    Patient Name: Austin Gray. Date of Encounter: 12/17/2021  Primary Care Provider:  [name], MD Primary Cardiologist:  Minus Breeding, MD  Chief Complaint    88 year old male with a history of permanent atrial fibrillation, mitral valve regurgitation s/p mitral clip, chronic combined systolic and diastolic heart failure, CHB s/p PPM, hypertension, BPH, pernicious anemia, anxiety, and depression who presents for follow-up related to heart failure, atrial fibrillation, and mitral valve regurgitation.  Past Medical History    Past Medical History:  Diagnosis Date   Anxiety    Atrial fibrillation ([place])    Chronic   Cataracts, bilateral    to have left eye cataract surgery on 03/21/17    CHF (congestive heart failure), NYHA class II, chronic, diastolic (HCC)    Colon polyps    last colonoscopy 2015 (tubular adenoma)   Depression    GERD (gastroesophageal reflux disease)    H/O: GI bleed    Recurrent   Hemorrhoids    History of BPH    History of kidney stones    hx of x 1  passed stones   Hypertension    Pernicious anemia    Presence of permanent cardiac pacemaker    PVC (premature ventricular contraction)    S/P mitral valve clip implantation 03/25/2021   Successful transcatheter edge-to-edge mitral valve repair with placement of a MitraClip G4 XTW clip positioned A2/P2 and placement of a MitraClip G4 NTW clip positioned on the lateral side of A2/P2, reducing mitral regurgitation from 4+ at baseline to 1-2+ post procedure   Severe mitral regurgitation    Vitamin D deficiency    Past Surgical History:  Procedure Laterality Date   COLONOSCOPY WITH PROPOFOL N/A 03/17/2017   Procedure: COLONOSCOPY WITH PROPOFOL;  Surgeon: Carol Ada, MD;  Location: [facility];  Service: Endoscopy;  Laterality: N/A;   [place]; ~ 2002   left; right   MITRAL VALVE REPAIR N/A 03/25/2021   Procedure: MITRAL VALVE REPAIR;   Surgeon: Sherren Mocha, MD;  Location: [place] [facility];  Service: Cardiovascular;  Laterality: N/A;   MULTIPLE EXTRACTIONS WITH ALVEOLOPLASTY N/A 03/18/2021   Procedure: MULTIPLE EXTRACTION WITH ALVEOLOPLASTY;  Surgeon: Charlaine Dalton, DMD;  Location: [place];  Service: Dentistry;  Laterality: N/A;   PACEMAKER IMPLANT N/A 03/11/2019   Procedure: PACEMAKER IMPLANT;  Surgeon: Constance Haw, MD;  Location: [place] [facility];  Service: Cardiovascular;  Laterality: N/A;   RIGHT/LEFT HEART CATH AND CORONARY ANGIOGRAPHY N/A 02/24/2021   Procedure: RIGHT/LEFT HEART CATH AND CORONARY ANGIOGRAPHY;  Surgeon: Sherren Mocha, MD;  Location: [place] [facility];  Service: Cardiovascular;  Laterality: N/A;   TEE WITHOUT CARDIOVERSION N/A 12/23/2020   Procedure: TRANSESOPHAGEAL ECHOCARDIOGRAM (TEE);  Surgeon: Freada Bergeron, MD;  Location: [facility] ENDOSCOPY;  Service: Cardiovascular;  Laterality: N/A;   TEE WITHOUT CARDIOVERSION N/A 03/25/2021   Procedure: TRANSESOPHAGEAL ECHOCARDIOGRAM (TEE);  Surgeon: Sherren Mocha, MD;  Location: [place] [facility];  Service: Cardiovascular;  Laterality: N/A;   TEMPORARY PACEMAKER N/A 03/10/2019   Procedure: TEMPORARY PACEMAKER;  Surgeon: Martinique, Peter M, MD;  Location: [place] [facility];  Service: Cardiovascular;  Laterality: N/A;    Allergies  No Known Allergies  History of Present Illness    88 year old male with the above past medical history including permanent atrial fibrillation, mitral valve regurgitation s/p mitral clip, chronic combined systolic and diastolic heart failure, CHB s/p PPM,  hypertension, BPH, pernicious anemia, anxiety, and depression.  He has a history of atrial fibrillation on Xarelto for anticoagulation.  He was hospitalized in 2019 with bradycardia, symptoms of fluid volume overload.  His heart rate was in the 30s, beta-blocker was held, however, his bradycardia persisted.  Echocardiogram at the time showed EF 55 to 60%, mild LVH,  moderate MR, moderate TR. complete heart block in 2020 and underwent PPM placement August 2020.  [place] echocardiogram in May 2022 showed EF 55%, severe atrial enlargement and severe mitral valve regurgitation.  Subsequent TEE showed EF 55%, mild RV dysfunction/moderate enlargement, RVSP 39 mmHg, severe biatrial enlargement, severe TR, myxomatous mitral valvae with chordal rupture, P2 flail and P3 prolapse with resultant severe, highly eccentric anteriorly directed mitral regurgitation.  His only symptom was mild shortness of breath. Cardiac catheterization in August 2022 showed no significant CAD (pRCA 30%).  Successful transcatheter edge-to-edge mitral valve repair with placement of MitraClip G4 XT W clip in the A2/P2 and placement of a MitraClip G4 NT W positioned in the lateral side of A2/P2 reducing MR from 4+ at baseline to a 1-2+ in the postprocedural setting.  Procedure echocardiogram showed mild eccentric residual MR, however, overall MitraClip result showed excellent reduction in degree of mitral valve regurgitation.  His most recent echocardiogram in October 2022 showed EF 30 to 35%, moderately decreased LV function, LV global hypokinesis, mild asymmetric LVH, G3 DD, elevated LVEDP, severe biatrial enlargement, mild to moderate mitral valve regurgitation, mild to moderate mitral valve stenosis, mild aortic valve regurgitation. He was last seen in the office on 11/15/2021 was stable overall from a cardiac standpoint.  He was started on Entresto.  Follow-up was recommended with structural heart clinic with repeat echocardiogram given stable mild gradient and regurgitation.  He presents today for follow-up accompanied by his neighbor. Since his last visit he has been stable from a cardiac standpoint though he does report some increased fatigue, occasional lightheadedness since starting Entresto.  He thinks the dose is too much for him and is asking if it can be reduced.  He has mild bilateral lower extremity  edema, however he states this is improved since his last visit.  He states he has only been taking furosemide 40 mg once daily, though it is prescribed to be taken twice daily.  He denies worsening dyspnea, edema, PND, orthopnea, denies symptoms concerning for angina. He comes in today with poor overall hygiene, visible live bedbugs.  Other than his mild fatigue, and occasional lightheadedness, he denies any new concerns today.  Home Medications    Current Outpatient Medications  Medication Sig Dispense Refill   acetaminophen (TYLENOL) 500 MG tablet Take 500 mg by mouth every 6 (six) hours as needed for mild pain, fever or headache.     albuterol (VENTOLIN HFA) 108 (90 Base) MCG/ACT inhaler INHALE 2 PUFFS EVERY 6 HOURS AS NEEDED FOR WHEEZING OR SHORTNESS OF BREATH 18 g 3   amLODipine (NORVASC) 10 MG tablet Take 1 tablet (10 mg total) by mouth daily. 90 tablet 3   amoxicillin (AMOXIL) 500 MG capsule Take 4 capsules (2,000 mg total) by mouth as directed. Take 4 tablets 1 hour prior to dental work, including cleanings. 12 capsule 12   carvedilol (COREG) 25 MG tablet TAKE 1 TABLET TWICE DAILY 180 tablet 2   ferrous sulfate 325 (65 FE) MG tablet Take 325 mg by mouth every other day.      fluticasone (FLONASE) 50 MCG/ACT nasal spray USE 1 SPRAY IN EACH NOSTRIL  EVERY DAY 32 g 1   furosemide (LASIX) 40 MG tablet Take 1 tablet (40 mg total) by mouth 2 (two) times daily. 270 tablet 1   guaiFENesin-dextromethorphan (ROBITUSSIN DM) 100-10 MG/5ML syrup Take 5 mLs by mouth every 4 (four) hours as needed for cough.     Multiple Vitamin (MULTIVITAMIN) tablet Take 1 tablet by mouth daily.     multivitamin-lutein (OCUVITE-LUTEIN) CAPS Take 1 capsule by mouth daily.     polyvinyl alcohol (LIQUIFILM TEARS) 1.4 % ophthalmic solution Place 1 drop into both eyes as needed for dry eyes.     potassium chloride (KLOR-CON) 10 MEQ tablet Take 1 tablet (10 mEq total) by mouth daily. 90 tablet 3   sacubitril-valsartan  (ENTRESTO) 24-26 MG Take 1 tablet by mouth 2 (two) times daily. 60 tablet 3   vitamin B-12 (CYANOCOBALAMIN) 1000 MCG tablet TAKE 1 TABLET EVERY DAY 90 tablet 3   XARELTO 20 MG TABS tablet TAKE 1 TABLET EVERY DAY WITH SUPPER 90 tablet 3   No current facility-administered medications for this visit.     Review of Systems    He denies chest pain, palpitations, dyspnea, pnd, orthopnea, n, v, dizziness, syncope, weight gain, or early satiety. All other systems reviewed and are otherwise negative except as noted above.   Physical Exam    VS:  BP 115/69   Ht (P) '5\' 10"'$  (1.778 m)   Wt (P) 213 lb 9.6 oz (96.9 kg)   BMI (P) 30.65 kg/m   GEN: Well nourished, well developed, in no acute distress. HEENT: normal. Neck: Supple, no JVD, carotid bruits, or masses. Cardiac: RRR, no murmurs, rubs, or gallops. No clubbing, cyanosis, nonpitting bilateral lower extremity edema.  Radials/DP/PT 2+ and equal bilaterally.  Respiratory:  Respirations regular and unlabored, clear to auscultation bilaterally. GI: Soft, nontender, nondistended, BS + x 4. MS: no deformity or atrophy. Skin: warm and dry, no rash. Neuro:  Strength and sensation are intact. Psych: Normal affect.  Accessory Clinical Findings    ECG personally reviewed by me today -V paced, 70 bpm- no acute changes.  Lab Results  Component Value Date   WBC 6.6 11/15/2021   HGB 13.4 11/15/2021   HCT 39.1 11/15/2021   MCV 86 11/15/2021   PLT 130 (L) 11/15/2021   Lab Results  Component Value Date   CREATININE 0.80 11/15/2021   BUN 12 11/15/2021   NA 139 11/15/2021   K 4.0 11/15/2021   CL 101 11/15/2021   CO2 28 11/15/2021   Lab Results  Component Value Date   ALT 25 03/23/2021   AST 40 03/23/2021   ALKPHOS 65 03/23/2021   BILITOT 1.8 (H) 03/23/2021   Lab Results  Component Value Date   CHOL 179 12/01/2020   HDL 62 12/01/2020   LDLCALC 103 (H) 12/01/2020   TRIG 58 12/01/2020   CHOLHDL 2.9 12/01/2020    Lab Results   Component Value Date   HGBA1C 6.1 (H) 08/10/2018    Assessment & Plan    1. Chronic combined systolic and diastolic heart failure: Most recent echo in 04/2021 showed EF 30 to 35%, moderately decreased LV function, LV global hypokinesis, mild asymmetric LVH, G3 DD, elevated LVEDP, severe biatrial enlargement, mild to moderate mitral valve regurgitation, mild to moderate mitral valve stenosis, mild aortic valve regurgitation.  He notes ongoing mild BLE, increased fatigue and occasional lightheadedness with lower BP since starting Entresto.  Weight is up slightly since last visit.  He states he has only been taking  his Lasix once daily.  He denies worsening dyspnea, PND, orthopnea.  We will decrease Entresto to 24-26 mg twice daily.  We will increase Lasix to 40 mg twice daily if symptoms/fluid volume status improve.  Check BMET today and in 2 weeks.   2. Mitral valve regurgitation: Most recent echo as above.  At his most recent OV,  Dr. Percival Spanish recommended follow-up with structural heart team for repeat echocardiogram and ongoing evaluation.  We will arrange for this today.   3. Permanent atrial fibrillation: EKG today shows ventricular paced rhythm.  Denies bleeding on Xarelto.  Continue Xarelto, carvedilol.  4. CHB: S/p PPM.  Most recent interrogation in March 2023 showed normal device function.  5. Hypertension: BP overall well controlled, though he does report some fatigue and occasional lightheadedness with lower BP since starting Entresto.  Will decrease Entresto as above. Continue to monitor BP and report any ongoing symptoms.  6. Disposition:  Follow-up in 4-6 months with Dr. Roney Mans, NP 12/17/2021, 12:27 PM

## 2021-12-17 ENCOUNTER — Ambulatory Visit (INDEPENDENT_AMBULATORY_CARE_PROVIDER_SITE_OTHER): Payer: Medicare Other | Admitting: Nurse Practitioner

## 2021-12-17 ENCOUNTER — Other Ambulatory Visit: Payer: Self-pay

## 2021-12-17 ENCOUNTER — Encounter: Payer: Self-pay | Admitting: Nurse Practitioner

## 2021-12-17 VITALS — BP 115/69

## 2021-12-17 DIAGNOSIS — Z79899 Other long term (current) drug therapy: Secondary | ICD-10-CM | POA: Diagnosis not present

## 2021-12-17 DIAGNOSIS — I340 Nonrheumatic mitral (valve) insufficiency: Secondary | ICD-10-CM

## 2021-12-17 DIAGNOSIS — Z9889 Other specified postprocedural states: Secondary | ICD-10-CM

## 2021-12-17 DIAGNOSIS — Z95818 Presence of other cardiac implants and grafts: Secondary | ICD-10-CM | POA: Diagnosis not present

## 2021-12-17 DIAGNOSIS — I5042 Chronic combined systolic (congestive) and diastolic (congestive) heart failure: Secondary | ICD-10-CM

## 2021-12-17 DIAGNOSIS — I10 Essential (primary) hypertension: Secondary | ICD-10-CM | POA: Diagnosis not present

## 2021-12-17 DIAGNOSIS — I4821 Permanent atrial fibrillation: Secondary | ICD-10-CM

## 2021-12-17 DIAGNOSIS — Z950 Presence of cardiac pacemaker: Secondary | ICD-10-CM

## 2021-12-17 DIAGNOSIS — I442 Atrioventricular block, complete: Secondary | ICD-10-CM | POA: Diagnosis not present

## 2021-12-17 LAB — BASIC METABOLIC PANEL
BUN/Creatinine Ratio: 21 (ref 10–24)
BUN: 19 mg/dL (ref 8–27)
CO2: 27 mmol/L (ref 20–29)
Calcium: 9.8 mg/dL (ref 8.6–10.2)
Chloride: 101 mmol/L (ref 96–106)
Creatinine, Ser: 0.9 mg/dL (ref 0.76–1.27)
Glucose: 109 mg/dL — ABNORMAL HIGH (ref 70–99)
Potassium: 4.1 mmol/L (ref 3.5–5.2)
Sodium: 140 mmol/L (ref 134–144)
eGFR: 83 mL/min/{1.73_m2} (ref >59)

## 2021-12-17 MED ORDER — ENTRESTO 24-26 MG PO TABS
1.0000 | ORAL_TABLET | Freq: Two times a day (BID) | ORAL | 3 refills | Status: DC
Start: 2021-12-17 — End: 2022-03-14

## 2021-12-17 NOTE — Patient Instructions (Addendum)
Medication Instructions:  Decrease Entresto 24/26 mg one tab twice daily  *If you need a refill on your cardiac medications before your next appointment, please call your pharmacy*   Lab Work: Complete labs today and in 2 weeks. BMET  If you have labs (blood work) drawn today and your tests are completely normal, you will receive your results only by: [place] (if you have MyChart) OR A paper copy in the mail If you have any lab test that is abnormal or we need to change your treatment, we will call you to review the results.   Testing/Procedures: NONE ordered at this time of appointment    Follow-Up: At [facility], you and your health needs are our priority.  As part of our continuing mission to provide you with exceptional heart care, we have created designated Provider Care Teams.  These Care Teams include your primary Cardiologist (physician) and Advanced Practice Providers (APPs -  Physician Assistants and Nurse Practitioners) who all work together to provide you with the care you need, when you need it.  We recommend signing up for the patient portal called "MyChart".  Sign up information is provided on this After Visit Summary.  MyChart is used to connect with patients for Virtual Visits (Telemedicine).  Patients are able to view lab/test results, encounter notes, upcoming appointments, etc.  Non-urgent messages can be sent to your provider as well.   To learn more about what you can do with MyChart, go to [email or web address].    Your next appointment:   4-6 month(s)  The format for your next appointment:   In Person  Provider:   Minus Breeding, MD     Other Instructions Follow up with Structural Heart Team  Important Information About Sugar

## 2021-12-21 ENCOUNTER — Other Ambulatory Visit: Payer: Self-pay

## 2021-12-21 ENCOUNTER — Telehealth: Payer: Self-pay

## 2021-12-21 DIAGNOSIS — I340 Nonrheumatic mitral (valve) insufficiency: Secondary | ICD-10-CM

## 2021-12-21 DIAGNOSIS — Z95818 Presence of other cardiac implants and grafts: Secondary | ICD-10-CM

## 2021-12-21 NOTE — Telephone Encounter (Signed)
Spoke to pt. Pt was notified of results, will continue his current medication and follow up as planned.

## 2021-12-22 ENCOUNTER — Ambulatory Visit (INDEPENDENT_AMBULATORY_CARE_PROVIDER_SITE_OTHER): Payer: Medicare Other

## 2021-12-22 DIAGNOSIS — I442 Atrioventricular block, complete: Secondary | ICD-10-CM | POA: Diagnosis not present

## 2021-12-23 LAB — CUP PACEART REMOTE DEVICE CHECK
Battery Remaining Longevity: 97 mo
Battery Remaining Percentage: 79 %
Battery Voltage: 3.01 V
Brady Statistic RV Percent Paced: 97 %
Date Time Interrogation Session: 20230607020012
Implantable Lead Implant Date: 20200824
Implantable Lead Location: 753860
Implantable Pulse Generator Implant Date: 20200824
Lead Channel Impedance Value: 450 Ohm
Lead Channel Pacing Threshold Amplitude: 1.375 V
Lead Channel Pacing Threshold Pulse Width: 0.5 ms
Lead Channel Sensing Intrinsic Amplitude: 9 mV
Lead Channel Setting Pacing Amplitude: 1.625
Lead Channel Setting Pacing Pulse Width: 0.5 ms
Lead Channel Setting Sensing Sensitivity: 0.7 mV
Pulse Gen Model: 1272
Pulse Gen Serial Number: 9140133

## 2021-12-31 ENCOUNTER — Other Ambulatory Visit: Payer: Self-pay

## 2021-12-31 ENCOUNTER — Other Ambulatory Visit: Payer: Medicare Other

## 2021-12-31 DIAGNOSIS — E785 Hyperlipidemia, unspecified: Secondary | ICD-10-CM

## 2021-12-31 DIAGNOSIS — I5032 Chronic diastolic (congestive) heart failure: Secondary | ICD-10-CM | POA: Diagnosis not present

## 2021-12-31 DIAGNOSIS — R739 Hyperglycemia, unspecified: Secondary | ICD-10-CM | POA: Diagnosis not present

## 2021-12-31 NOTE — Progress Notes (Signed)
Remote pacemaker transmission.   

## 2022-01-05 ENCOUNTER — Telehealth: Payer: Self-pay | Admitting: Cardiology

## 2022-01-05 NOTE — Telephone Encounter (Signed)
Patient has lab work with Dr. Dennard Schaumann, PCP, on 6/16 and his sugar level is a little elevated. He would like to have Dr. Percival Spanish review this information if possible.

## 2022-01-07 LAB — CBC WITH DIFFERENTIAL/PLATELET
Absolute Monocytes: 542 cells/uL (ref 200–950)
Basophils Absolute: 38 cells/uL (ref 0–200)
Basophils Relative: 0.6 %
Eosinophils Absolute: 88 cells/uL (ref 15–500)
Eosinophils Relative: 1.4 %
HCT: 43.5 % (ref 38.5–50.0)
Hemoglobin: 13.9 g/dL (ref 13.2–17.1)
Lymphs Abs: 1310 cells/uL (ref 850–3900)
MCH: 28.9 pg (ref 27.0–33.0)
MCHC: 32 g/dL (ref 32.0–36.0)
MCV: 90.4 fL (ref 80.0–100.0)
MPV: 11 fL (ref 7.5–12.5)
Monocytes Relative: 8.6 %
Neutro Abs: 4322 cells/uL (ref 1500–7800)
Neutrophils Relative %: 68.6 %
Platelets: 118 10*3/uL — ABNORMAL LOW (ref 140–400)
RBC: 4.81 10*6/uL (ref 4.20–5.80)
RDW: 13.4 % (ref 11.0–15.0)
Total Lymphocyte: 20.8 %
WBC: 6.3 10*3/uL (ref 3.8–10.8)

## 2022-01-07 LAB — LIPID PANEL
Cholesterol: 199 mg/dL (ref <200)
HDL: 79 mg/dL (ref >=40)
LDL Cholesterol (Calc): 105 mg/dL (calc) — ABNORMAL HIGH
Non-HDL Cholesterol (Calc): 120 mg/dL (calc) (ref <130)
Total CHOL/HDL Ratio: 2.5 (calc) (ref <5.0)
Triglycerides: 65 mg/dL (ref <150)

## 2022-01-07 LAB — BASIC METABOLIC PANEL
BUN: 14 mg/dL (ref 7–25)
CO2: 29 mmol/L (ref 20–32)
Calcium: 9.4 mg/dL (ref 8.6–10.3)
Chloride: 103 mmol/L (ref 98–110)
Creat: 0.9 mg/dL (ref 0.70–1.22)
Glucose, Bld: 125 mg/dL — ABNORMAL HIGH (ref 65–99)
Potassium: 3.9 mmol/L (ref 3.5–5.3)
Sodium: 140 mmol/L (ref 135–146)

## 2022-01-07 LAB — TEST AUTHORIZATION

## 2022-01-07 LAB — HEMOGLOBIN A1C W/OUT EAG: Hgb A1c MFr Bld: 6.1 % of total Hgb — ABNORMAL HIGH (ref <5.7)

## 2022-01-10 NOTE — Telephone Encounter (Signed)
No accepting calls from the number nurse called from [place] forward to NL triage to attempt and inform of [facility] response

## 2022-01-10 NOTE — Telephone Encounter (Signed)
Rollene Rotunda, MD  Eustace Moore, RN 3 days ago    Noted.  I will defer sugar management to PCP.  Thanks.    Spoke with pt regarding Dr. Jenene Slicker recommendations. Pt verbalizes understanding.

## 2022-01-11 IMAGING — CR DG CHEST 2V
2 series · 2 of 2 positions shown · non-contrast
Comparison: 03/12/2019

CLINICAL DATA: Preoperative respiratory evaluation for mitral valve
repair.

EXAM:
CHEST - 2 VIEW

[w chest lat]
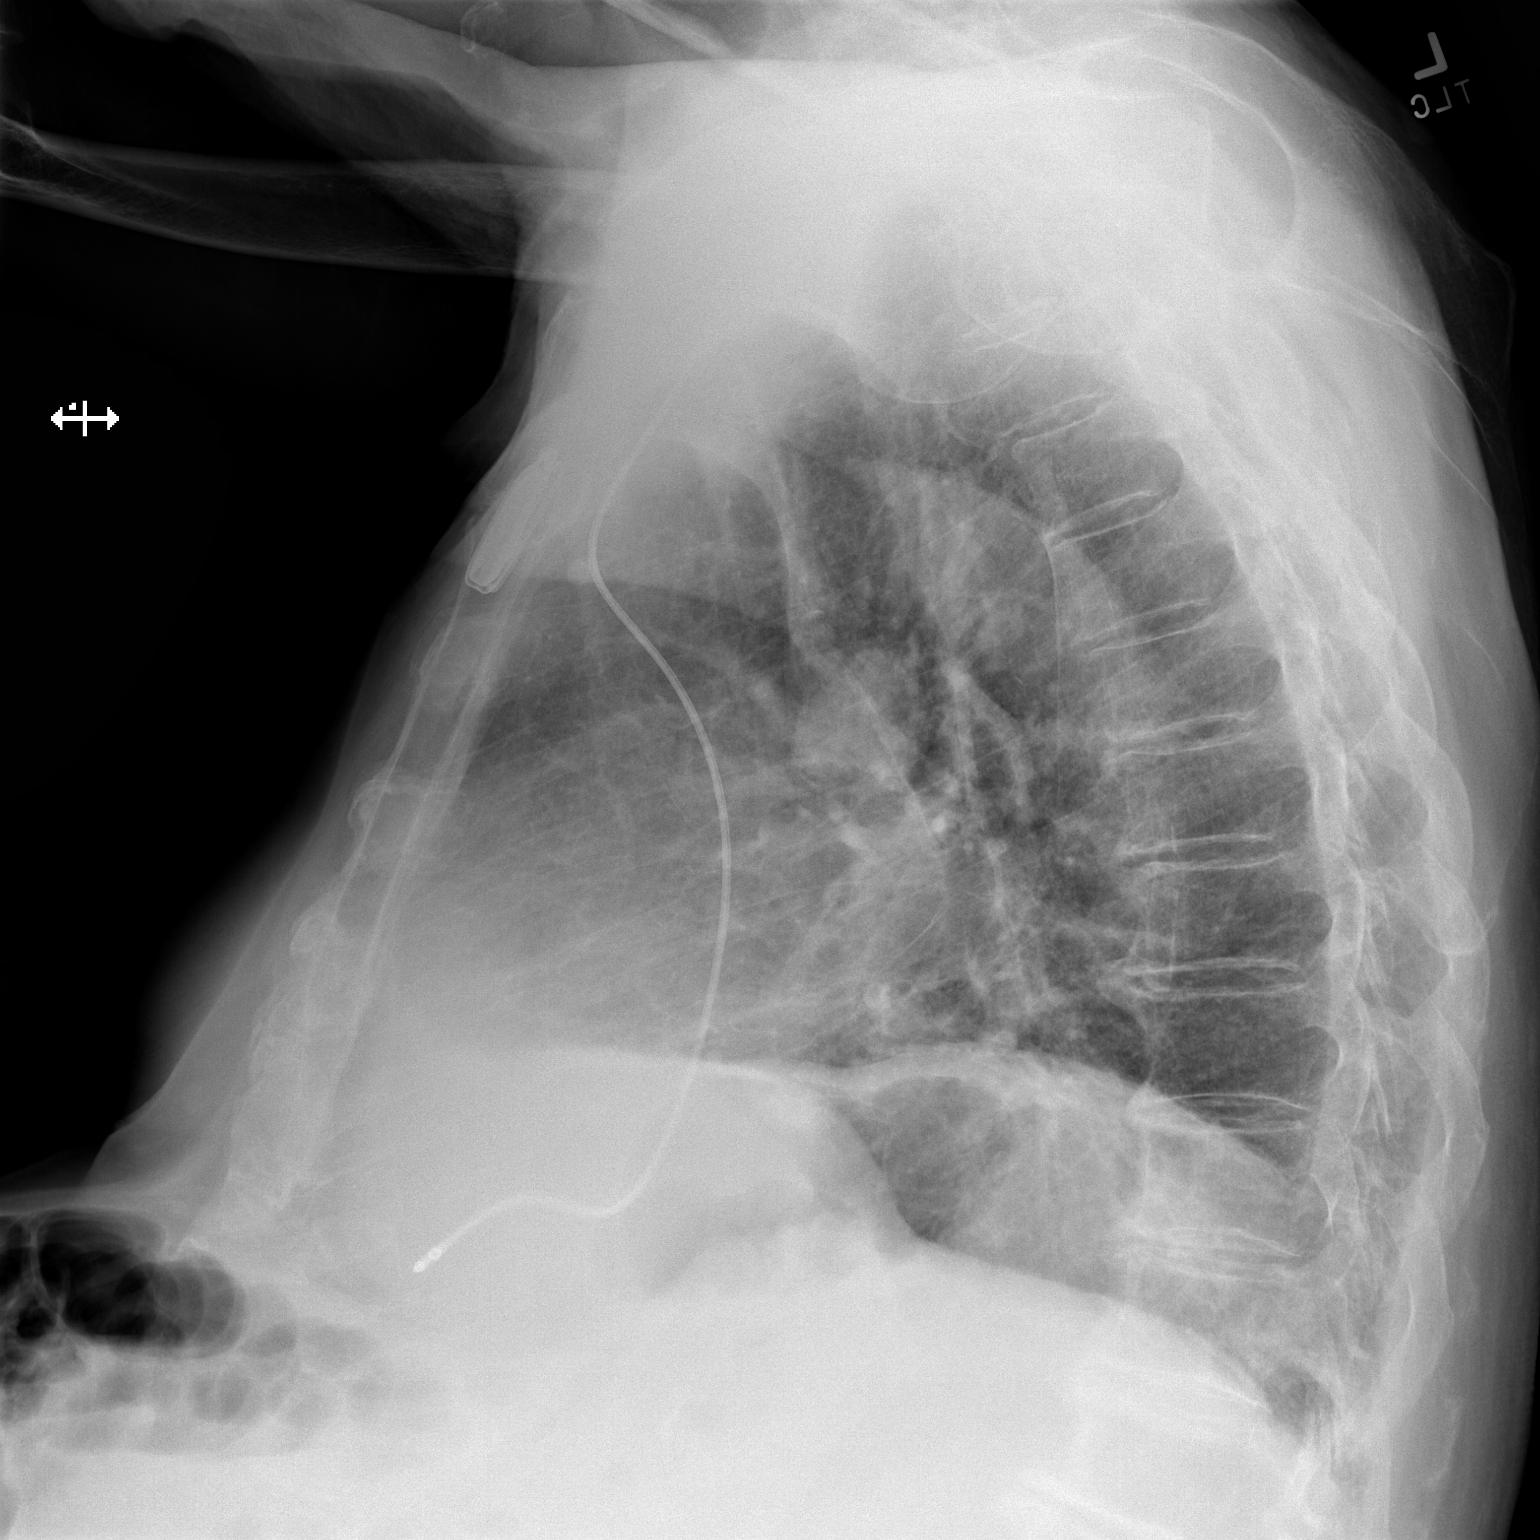

[x chest ap]
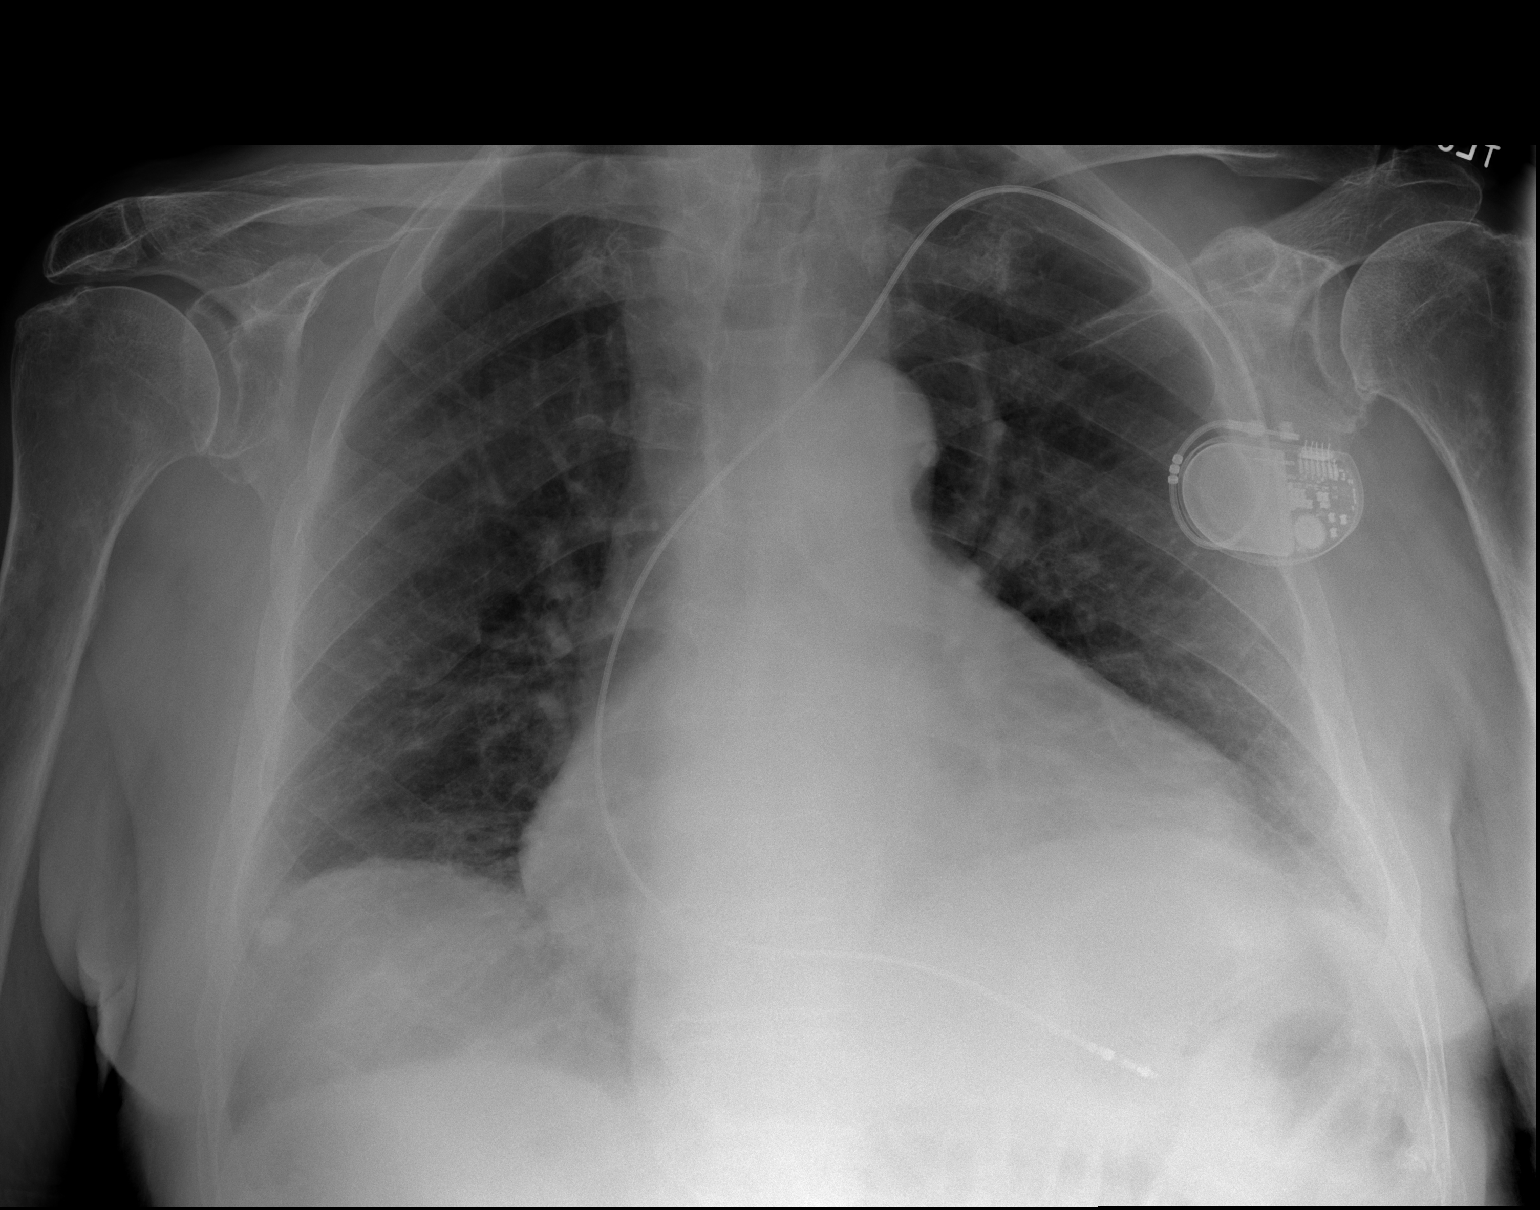

[2 of 2 positions shown; findings below may reference images not displayed]

FINDINGS: Low volume film. The cardio pericardial silhouette is enlarged. The
lungs are clear without focal pneumonia, edema, pneumothorax or
pleural effusion. Nodular density at the right base stable since
x-ray from 09/07/2017, likely calcified granuloma. Left pacer device
again noted. Bones are demineralized.
IMPRESSION: No acute cardiopulmonary findings.

## 2022-01-25 ENCOUNTER — Ambulatory Visit (INDEPENDENT_AMBULATORY_CARE_PROVIDER_SITE_OTHER): Payer: Medicare Other | Admitting: Family Medicine

## 2022-01-25 VITALS — BP 134/82 | HR 70 | Temp 97.7°F | Ht 70.0 in | Wt 209.0 lb

## 2022-01-25 DIAGNOSIS — I420 Dilated cardiomyopathy: Secondary | ICD-10-CM

## 2022-01-25 DIAGNOSIS — I5032 Chronic diastolic (congestive) heart failure: Secondary | ICD-10-CM | POA: Diagnosis not present

## 2022-01-25 DIAGNOSIS — I4819 Other persistent atrial fibrillation: Secondary | ICD-10-CM

## 2022-01-25 MED ORDER — AMLODIPINE BESYLATE 10 MG PO TABS: 10.0000 mg | ORAL_TABLET | Freq: Every day | ORAL | 3 refills | Status: DC

## 2022-01-25 NOTE — Progress Notes (Signed)
Subjective:    Patient ID: Austin Mesi., male    DOB: [birth year], 88 y.o.   MRN: [id number]  HPI  Patient had an echocardiogram in October which revealed an ejection fraction of 30 to 35%.  He has atrial fibrillation.  He is currently anticoagulated with Eliquis.  Today he is still in atrial fibrillation however his rate is controlled.  He saw cardiology in May and he was switched to [facility].  Patient has had to reduce the dose of Entresto down to 24/26 twice daily due to hypotension and dizziness.  I encouraged him to stick with this medication due to the reduction in mortality associated with Entresto in a patient with congestive heart failure.  He states other than the dizziness he is doing okay on the medication.  He denies any syncope.  He denies any chest pain.  He does occasionally have some shortness of breath.  Cardiology recently increased his Lasix back to 40 mg twice daily.  I do not see that the patient's had his potassium and his creatinine checked since that time.  Therefore I would like to check that today.  He has a history of GI bleed.  However his last hemoglobin in June was outstanding at greater than 13.  Therefore my only concern on his lab work with his renal function and his potassium.  He does not appear fluid overloaded today.  His lungs are clear.  He does have trace bipedal edema but nothing significant Past Medical History:  Diagnosis Date   Anxiety    Atrial fibrillation (HCC)    Chronic   Cataracts, bilateral    to have left eye cataract surgery on 03/21/17    CHF (congestive heart failure), NYHA class II, chronic, diastolic (HCC)    Colon polyps    last colonoscopy 2015 (tubular adenoma)   Depression    GERD (gastroesophageal reflux disease)    H/O: GI bleed    Recurrent   Hemorrhoids    History of BPH    History of kidney stones    hx of x 1  passed stones   Hypertension    Pernicious anemia    Presence of permanent cardiac pacemaker    PVC  (premature ventricular contraction)    S/P mitral valve clip implantation 03/25/2021   Successful transcatheter edge-to-edge mitral valve repair with placement of a MitraClip G4 XTW clip positioned A2/P2 and placement of a MitraClip G4 NTW clip positioned on the lateral side of A2/P2, reducing mitral regurgitation from 4+ at baseline to 1-2+ post procedure   Severe mitral regurgitation    Vitamin D deficiency    Past Surgical History:  Procedure Laterality Date   COLONOSCOPY WITH PROPOFOL N/A 03/17/2017   Procedure: COLONOSCOPY WITH PROPOFOL;  Surgeon: Carol Ada, MD;  Location: [facility];  Service: Endoscopy;  Laterality: N/A;   [place]; ~ 2002   left; right   MITRAL VALVE REPAIR N/A 03/25/2021   Procedure: MITRAL VALVE REPAIR;  Surgeon: Sherren Mocha, MD;  Location: [place] [facility];  Service: Cardiovascular;  Laterality: N/A;   MULTIPLE EXTRACTIONS WITH ALVEOLOPLASTY N/A 03/18/2021   Procedure: MULTIPLE EXTRACTION WITH ALVEOLOPLASTY;  Surgeon: Charlaine Dalton, DMD;  Location: [place];  Service: Dentistry;  Laterality: N/A;   PACEMAKER IMPLANT N/A 03/11/2019   Procedure: PACEMAKER IMPLANT;  Surgeon: Constance Haw, MD;  Location: [place] [facility];  Service: Cardiovascular;  Laterality: N/A;   RIGHT/LEFT  HEART CATH AND CORONARY ANGIOGRAPHY N/A 02/24/2021   Procedure: RIGHT/LEFT HEART CATH AND CORONARY ANGIOGRAPHY;  Surgeon: Sherren Mocha, MD;  Location: [place] [facility];  Service: Cardiovascular;  Laterality: N/A;   TEE WITHOUT CARDIOVERSION N/A 12/23/2020   Procedure: TRANSESOPHAGEAL ECHOCARDIOGRAM (TEE);  Surgeon: Freada Bergeron, MD;  Location: [facility] END[facility];  Service: Cardiovascular;  Laterality: N/A;   TEE WITHOUT CARDIOVERSION N/A 03/25/2021   Procedure: TRANSESOPHAGEAL ECHOCARDIOGRAM (TEE);  Surgeon: Sherren Mocha, MD;  Location: [place] [facility];  Service: Cardiovascular;  Laterality: N/A;   TEMPORARY PACEMAKER N/A  03/10/2019   Procedure: TEMPORARY PACEMAKER;  Surgeon: Martinique, Peter M, MD;  Location: [place] [facility];  Service: Cardiovascular;  Laterality: N/A;   Current Outpatient Medications on File Prior to Visit  Medication Sig Dispense Refill   acetaminophen (TYLENOL) 500 MG tablet Take 500 mg by mouth every 6 (six) hours as needed for mild pain, fever or headache.     albuterol (VENTOLIN HFA) 108 (90 Base) MCG/ACT inhaler INHALE 2 PUFFS EVERY 6 HOURS AS NEEDED FOR WHEEZING OR SHORTNESS OF BREATH 18 g 3   amLODipine (NORVASC) 10 MG tablet Take 1 tablet (10 mg total) by mouth daily. 90 tablet 3   carvedilol (COREG) 25 MG tablet TAKE 1 TABLET TWICE DAILY 180 tablet 2   ferrous sulfate 325 (65 FE) MG tablet Take 325 mg by mouth every other day.      fluticasone (FLONASE) 50 MCG/ACT nasal spray USE 1 SPRAY IN EACH NOSTRIL EVERY DAY 32 g 1   furosemide (LASIX) 40 MG tablet Take 1 tablet (40 mg total) by mouth 2 (two) times daily. 270 tablet 1   guaiFENesin-dextromethorphan (ROBITUSSIN DM) 100-10 MG/5ML syrup Take 5 mLs by mouth every 4 (four) hours as needed for cough.     Multiple Vitamin (MULTIVITAMIN) tablet Take 1 tablet by mouth daily.     multivitamin-lutein (OCUVITE-LUTEIN) CAPS Take 1 capsule by mouth daily.     polyvinyl alcohol (LIQUIFILM TEARS) 1.4 % ophthalmic solution Place 1 drop into both eyes as needed for dry eyes.     potassium chloride (KLOR-CON) 10 MEQ tablet Take 1 tablet (10 mEq total) by mouth daily. 90 tablet 3   sacubitril-valsartan (ENTRESTO) 24-26 MG Take 1 tablet by mouth 2 (two) times daily. 60 tablet 3   vitamin B-12 (CYANOCOBALAMIN) 1000 MCG tablet TAKE 1 TABLET EVERY DAY 90 tablet 3   XARELTO 20 MG TABS tablet TAKE 1 TABLET EVERY DAY WITH SUPPER 90 tablet 3   amoxicillin (AMOXIL) 500 MG capsule Take 4 capsules (2,000 mg total) by mouth as directed. Take 4 tablets 1 hour prior to dental work, including cleanings. (Patient not taking: Reported on 01/25/2022) 12 capsule 12    No current facility-administered medications on file prior to visit.   No Known Allergies Social History   Socioeconomic History   Marital status: Widowed    Spouse name: Not on file   Number of children: 1   Years of education: Not on file   Highest education level: Not on file  Occupational History   Occupation: Retired  Tobacco Use   Smoking status: Never   Smokeless tobacco: Former    Types: Snuff   Tobacco comments:    03/17/21 "still use snuff daily"  Vaping Use   Vaping Use: Never used  Substance and Sexual Activity   Alcohol use: No   Drug use: No   Sexual activity: Never    Comment: widower, lives with granddaughter and great  grandchildren.  Other Topics Concern   Not on file  Social History Narrative   Lives with grand daughter.         Epworth Sleepiness Scale = 7 (as of 09/21/2015)   Social Determinants of Health   Financial Resource Strain: Low Risk  (04/29/2021)   Overall Financial Resource Strain (CARDIA)    Difficulty of Paying Living Expenses: Not hard at all  Food Insecurity: No Food Insecurity (04/29/2021)   Hunger Vital Sign    Worried About Running Out of Food in the Last Year: Never true    Ran Out of Food in the Last Year: Never true  Transportation Needs: No Transportation Needs (04/29/2021)   PRAPARE - Hydrologist (Medical): No    Lack of Transportation (Non-Medical): No  Physical Activity: Sufficiently Active (04/29/2021)   Exercise Vital Sign    Days of Exercise per Week: 5 days    Minutes of Exercise per Session: 30 min  Stress: No Stress Concern Present (04/29/2021)   [place]    Feeling of Stress : Not at all  Social Connections: Socially Isolated (04/29/2021)   Social Connection and Isolation Panel [NHANES]    Frequency of Communication with Friends and Family: More than three times a week    Frequency of Social Gatherings with  Friends and Family: More than three times a week    Attends Religious Services: Never    Marine scientist or Organizations: No    Attends Archivist Meetings: Never    Marital Status: Widowed  Intimate Partner Violence: Not At Risk (04/29/2021)   Humiliation, Afraid, Rape, and Kick questionnaire    Fear of Current or Ex-Partner: No    Emotionally Abused: No    Physically Abused: No    Sexually Abused: No      Review of Systems  All other systems reviewed and are negative.      Objective:   Physical Exam Vitals reviewed.  Constitutional:      Appearance: Normal appearance.  Cardiovascular:     Rate and Rhythm: Normal rate. Rhythm irregular.     Heart sounds: Murmur heard.  Pulmonary:     Effort: Pulmonary effort is normal. No respiratory distress.     Breath sounds: Normal breath sounds. No stridor.  Musculoskeletal:     Right lower leg: No edema.     Left lower leg: No edema.  Neurological:     Mental Status: He is alert.           Assessment & Plan:   Chronic diastolic heart failure ([place]) - Plan: BASIC METABOLIC PANEL WITH GFR  Atrial fibrillation, persistent (HCC)  Dilated cardiomyopathy ([place]) - Plan: BASIC METABOLIC PANEL WITH GFR Given the recent change in the dose of Entresto along with the increase in his Lasix I will repeat a BMP to monitor his renal function and potassium.  His blood pressure today is excellent.  Although he is in atrial fibrillation his rate is controlled and he is appropriately anticoagulated with Eliquis.  His blood counts were stable recently in June with no evidence of any occult GI bleed.  Therefore as long as he can tolerate the Entresto at its current dose without dizziness and without any renal insufficiency, I will make no additional changes.  If his creatinine is rising, we may need to reduce the dose of his Lasix back to once a day.  Await the results  of his BMP

## 2022-01-25 NOTE — Addendum Note (Signed)
Addended by: Colman Cater on: 01/25/2022 09:53 AM   Modules accepted: Orders

## 2022-01-26 LAB — BASIC METABOLIC PANEL WITH GFR
BUN: 16 mg/dL (ref 7–25)
CO2: 27 mmol/L (ref 20–32)
Calcium: 9.3 mg/dL (ref 8.6–10.3)
Chloride: 102 mmol/L (ref 98–110)
Creat: 0.89 mg/dL (ref 0.70–1.22)
Glucose, Bld: 106 mg/dL — ABNORMAL HIGH (ref 65–99)
Potassium: 3.7 mmol/L (ref 3.5–5.3)
Sodium: 139 mmol/L (ref 135–146)
eGFR: 83 mL/min/{1.73_m2} (ref >=60)

## 2022-01-28 ENCOUNTER — Telehealth: Payer: Self-pay

## 2022-01-28 NOTE — Telephone Encounter (Signed)
Pt called in stating that he would like a copy of his lab results mailed to his street address: [place]  [id number]. Please advise  Cb#: [phone]

## 2022-01-28 NOTE — Progress Notes (Unsigned)
HEART AND [place]                                     Cardiology Office Note:    Date:  02/01/2022   ID:  [name], DOB 11-Aug-1931, MRN [id number]  PCP:  [name], MD  [facility] Cardiologist:  Minus Breeding, MD / Dr. Burt Knack, MD (TEER) [facility] [facility] Electrophysiologist:  Will Meredith Leeds, MD   Referring MD: [name], MD   Chief Complaint  Patient presents with   Follow-up    1 year s/p TEER    History of Present Illness:    Austin Gray. is a 88 y.o. male with a hx of  chronic persistent atrial fibrillation on Xarelto, CHB s/p PPM, HTN, chronic diastolic heart failure and severe mitral regurgitation who presents for 1 year follow s/p TEER  with Dr. Burt Knack 03/25/21.    [name] underwent an echocardiogram 12/07/20 which showed EF 55%, severe biatrial enlargement and severe mitral regurgitation. The mitral valve is myxomatous. The jet was eccentric and anteriorly directed with splay artifact suggestive of at least moderate regurgitation with a possible flail PMVL chord. 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 EROA 0.4cm2 with RVol 101m by PISA. No evidence of mitral stenosis.    He was seen by Dr. CBurt Knackon 12/30/20 for structural heart consultation and discussion of potential Mitral Valve TEER. The patient's transesophageal echo demonstrated findings consistent with severe mitral regurgitation secondary to degenerative changes of ruptured primary chordae tendonae and flail P2 segment of the mitral valve with eccentric MR. He did not appear to be particularly limited at that time with only mild  shortness of breath. He initially deferred procedure but then later changed his mind. L/RHC on 02/24/21 showed mild diffuse CAD and confirmed severe MR with large V waves (28 mm hg).   He underwent successful transcatheter edge-to-edge mitral valve repair with placement of a MitraClip G4 XTW clip in the A2/P2 and placement of a MitraClip G4 NTW positioned in the lateral side of A2/P2 reducing MR from 4+ at baseline to a 1-2+ in the post procedural setting. He was placed back on Xarelto for anticoagulation. Post procedure echocardiogram performed prior to hospital discharge showed mild eccentric residual MR however overall the MitraClip result showed excellent reduction in the degree of mitral regurgitation.    He was seen by myself 04/21/2021 at which time he was doing well with no complaints. Since that time he has been seen several times by Dr. HPercival Spanishas well as Dr. CCurt Bearsand his PCP.   Today he is here with his granddaughter who typically brings him to his appointments. He reports having some dizziness since starting Entresto in May. He was initially started on mid dose 49/'51mg'$  however this was changed to 24/26 dosing given dizziness. He was recently seen by his PCP 01/25/22 at which time BMET was drawn to follow renal and electrolyte response given ARNI. This showed stable creatinine and K+ at 0.89 and 3.7. He denies SOB, chest pain, palpitations, LE edema, orthopnea, or syncope.   Past Medical History:  Diagnosis Date  Anxiety    Atrial fibrillation (HCC)    Chronic   Cataracts, bilateral    to have left eye cataract surgery on 03/21/17    CHF (congestive heart failure), NYHA class II, chronic, diastolic (HCC)    Colon polyps    last colonoscopy 2015 (tubular adenoma)   Depression    GERD (gastroesophageal reflux disease)    H/O: GI bleed    Recurrent   Hemorrhoids    History of BPH    History of kidney stones    hx of x 1  passed stones   Hypertension    Pernicious anemia     Presence of permanent cardiac pacemaker    PVC (premature ventricular contraction)    S/P mitral valve clip implantation 03/25/2021   Successful transcatheter edge-to-edge mitral valve repair with placement of a MitraClip G4 XTW clip positioned A2/P2 and placement of a MitraClip G4 NTW clip positioned on the lateral side of A2/P2, reducing mitral regurgitation from 4+ at baseline to 1-2+ post procedure   Severe mitral regurgitation    Vitamin D deficiency     Past Surgical History:  Procedure Laterality Date   COLONOSCOPY WITH PROPOFOL N/A 03/17/2017   Procedure: COLONOSCOPY WITH PROPOFOL;  Surgeon: Carol Ada, MD;  Location: [facility];  Service: Endoscopy;  Laterality: N/A;   [place]; ~ 2002   left; right   MITRAL VALVE REPAIR N/A 03/25/2021   Procedure: MITRAL VALVE REPAIR;  Surgeon: Sherren Mocha, MD;  Location: [place] [facility];  Service: Cardiovascular;  Laterality: N/A;   MULTIPLE EXTRACTIONS WITH ALVEOLOPLASTY N/A 03/18/2021   Procedure: MULTIPLE EXTRACTION WITH ALVEOLOPLASTY;  Surgeon: Charlaine Dalton, DMD;  Location: [place];  Service: Dentistry;  Laterality: N/A;   PACEMAKER IMPLANT N/A 03/11/2019   Procedure: PACEMAKER IMPLANT;  Surgeon: Constance Haw, MD;  Location: [place] [facility];  Service: Cardiovascular;  Laterality: N/A;   RIGHT/LEFT HEART CATH AND CORONARY ANGIOGRAPHY N/A 02/24/2021   Procedure: RIGHT/LEFT HEART CATH AND CORONARY ANGIOGRAPHY;  Surgeon: Sherren Mocha, MD;  Location: [place] [facility];  Service: Cardiovascular;  Laterality: N/A;   TEE WITHOUT CARDIOVERSION N/A 12/23/2020   Procedure: TRANSESOPHAGEAL ECHOCARDIOGRAM (TEE);  Surgeon: Freada Bergeron, MD;  Location: [facility];  Service: Cardiovascular;  Laterality: N/A;   TEE WITHOUT CARDIOVERSION N/A 03/25/2021   Procedure: TRANSESOPHAGEAL ECHOCARDIOGRAM (TEE);  Surgeon: Sherren Mocha, MD;  Location: [place] [facility];  Service: Cardiovascular;   Laterality: N/A;   TEMPORARY PACEMAKER N/A 03/10/2019   Procedure: TEMPORARY PACEMAKER;  Surgeon: Martinique, Peter M, MD;  Location: [place] [facility];  Service: Cardiovascular;  Laterality: N/A;    Current Medications: Current Meds  Medication Sig   acetaminophen (TYLENOL) 500 MG tablet Take 500 mg by mouth every 6 (six) hours as needed for mild pain, fever or headache.   albuterol (VENTOLIN HFA) 108 (90 Base) MCG/ACT inhaler INHALE 2 PUFFS EVERY 6 HOURS AS NEEDED FOR WHEEZING OR SHORTNESS OF BREATH   ferrous sulfate 325 (65 FE) MG tablet Take 325 mg by mouth every other day.    fluticasone (FLONASE) 50 MCG/ACT nasal spray USE 1 SPRAY IN EACH NOSTRIL EVERY DAY   guaiFENesin-dextromethorphan (ROBITUSSIN DM) 100-10 MG/5ML syrup Take 5 mLs by mouth every 4 (four) hours as needed for cough.   Multiple Vitamin (MULTIVITAMIN) tablet Take 1 tablet by mouth daily.   multivitamin-lutein (OCUVITE-LUTEIN) CAPS Take 1 capsule by mouth daily.   polyvinyl alcohol (LIQUIFILM  TEARS) 1.4 % ophthalmic solution Place 1 drop into both eyes as needed for dry eyes.   potassium chloride (KLOR-CON) 10 MEQ tablet Take 1 tablet (10 mEq total) by mouth daily.   sacubitril-valsartan (ENTRESTO) 24-26 MG Take 1 tablet by mouth 2 (two) times daily.   vitamin B-12 (CYANOCOBALAMIN) 1000 MCG tablet TAKE 1 TABLET EVERY DAY   XARELTO 20 MG TABS tablet TAKE 1 TABLET EVERY DAY WITH SUPPER   [DISCONTINUED] amLODipine (NORVASC) 10 MG tablet Take 1 tablet (10 mg total) by mouth daily.   [DISCONTINUED] carvedilol (COREG) 25 MG tablet TAKE 1 TABLET TWICE DAILY   [DISCONTINUED] furosemide (LASIX) 40 MG tablet Take 1 tablet (40 mg total) by mouth 2 (two) times daily.     Allergies:   Patient has no known allergies.   Social History   Socioeconomic History   Marital status: Widowed    Spouse name: Not on file   Number of children: 1   Years of education: Not on file   Highest education level: Not on file  Occupational History    Occupation: Retired  Tobacco Use   Smoking status: Never   Smokeless tobacco: Former    Types: Snuff   Tobacco comments:    03/17/21 "still use snuff daily"  Vaping Use   Vaping Use: Never used  Substance and Sexual Activity   Alcohol use: No   Drug use: No   Sexual activity: Never    Comment: widower, lives with granddaughter and great grandchildren.  Other Topics Concern   Not on file  Social History Narrative   Lives with grand daughter.         Epworth Sleepiness Scale = 7 (as of 09/21/2015)   Social Determinants of Health   Financial Resource Strain: Low Risk  (04/29/2021)   Overall Financial Resource Strain (CARDIA)    Difficulty of Paying Living Expenses: Not hard at all  Food Insecurity: No Food Insecurity (04/29/2021)   Hunger Vital Sign    Worried About Running Out of Food in the Last Year: Never true    Ran Out of Food in the Last Year: Never true  Transportation Needs: No Transportation Needs (04/29/2021)   PRAPARE - Hydrologist (Medical): No    Lack of Transportation (Non-Medical): No  Physical Activity: Sufficiently Active (04/29/2021)   Exercise Vital Sign    Days of Exercise per Week: 5 days    Minutes of Exercise per Session: 30 min  Stress: No Stress Concern Present (04/29/2021)   [place]    Feeling of Stress : Not at all  Social Connections: Socially Isolated (04/29/2021)   Social Connection and Isolation Panel [NHANES]    Frequency of Communication with Friends and Family: More than three times a week    Frequency of Social Gatherings with Friends and Family: More than three times a week    Attends Religious Services: Never    Marine scientist or Organizations: No    Attends Archivist Meetings: Never    Marital Status: Widowed    Family History: The patient's family history includes Cancer in his mother; Stroke (age of onset: 54) in  his father.  ROS:   Please see the history of present illness.    All other systems reviewed and are negative.  EKGs/Labs/Other Studies Reviewed:    The following studies were reviewed today:  Echocardiogram 01/31/22:   1. Left ventricular ejection fraction,  by estimation, is 35 to 40%. The  left ventricle has moderately decreased function. The left ventricle  demonstrates global hypokinesis. There is mild concentric left ventricular  hypertrophy. Left ventricular  diastolic parameters are indeterminate.   2. Right ventricular systolic function is normal. The right ventricular  size is normal.   3. Left atrial size was severely dilated.   4. Right atrial size was severely dilated.   5. S/p Mitra Clip (G4XTW, G4NTW 03/25/21). Peak and mean gradients through  the valve are 12 and 5 mm Hg respectively. MVA (VTI) is 1.52 cm2 (HR 76  bpm) consistent with mild mitral stenosis. Very mild MR directed  anteriorly. . The mitral valve has been  repaired/replaced. Mild mitral valve regurgitation.   6. The aortic valve is tricuspid. Aortic valve regurgitation is mild.  Aortic valve sclerosis/calcification is present, without any evidence of  aortic stenosis.   7. The inferior vena cava is normal in size with greater than 50%  respiratory variability, suggesting right atrial pressure of 3 mmHg.   Comparison(s): The left ventricular function is unchanged.    Mitral Valve Repair (TEER) 03/25/21:   Successful transcatheter edge-to-edge mitral valve repair with placement of a MitraClip G4 XTW clip positioned A2/P2 and placement of a MitraClip G4 NTW clip positioned on the lateral side of A2/P2, reducing mitral regurgitation from 4+ at baseline to 1-2+ post procedure   Recommend to resume Rivaroxaban, at currently prescribed dose and frequency on 03/25/2021. Concurrent antiplatelet therapy not recommended _____________   Echo 03/26/21:    1. Left ventricular ejection fraction, by estimation, is 50 to  55%. The  left ventricle has low normal function. The left ventricle demonstrates  global hypokinesis. Left ventricular diastolic function could not be  evaluated.   2. Right ventricular systolic function is mildly reduced. The right  ventricular size is mildly enlarged. There is moderately elevated  pulmonary artery systolic pressure.   3. Left atrial size was severely dilated.   4. Right atrial size was severely dilated.   5. There are two well-seated MitraClips in the A2P2 (middle scallop)  area. There is a residual mitral insufficiency jet, medial to the clips,  mild in severity. The mitral valve has been repaired/replaced. Mild mitral  valve regurgitation. Mild mitral  stenosis. The mean mitral valve gradient is 4.7 mmHg at 70 bpm. Pressure  half time is 121 ms.   6. Tricuspid valve regurgitation is moderate to severe.   7. The aortic valve is tricuspid. There is mild calcification of the  aortic valve. There is mild thickening of the aortic valve. Aortic valve  regurgitation is mild. No aortic stenosis is present.   8. The inferior vena cava is dilated in size with <50% respiratory  variability, suggesting right atrial pressure of 15 mmHg.    Comparison(s): Mildly elevated mitral valve gradients and mild residual  mitral insufficiency is unchanged from yesterday's post=procedure  findings. There is evidence of increased right ventricular systolic  pressure/pulmonary artery pressure and increased   right atrial pressure.    R/LHC 02/24/21:     Prox RCA lesion is 30% stenosed.   1.  Mild diffuse coronary irregularity/plaquing with no significant obstructive disease 2.  Known severe mitral regurgitation with large V waves (28 mmHg) 3.  Normal LV pressure, essentially normal right heart pressures  Plan: Continue evaluation for transcatheter edge-to-edge mitral valve repair   ____________________   Echo 04/21/21 IMPRESSIONS   1. The LV function is difficult to assess but  appears  to be moderately  depressed. . Left ventricular ejection fraction, by estimation, is 30 to  35%. The left ventricle has moderately decreased function. The left  ventricle demonstrates global hypokinesis. The left ventricular internal cavity size was moderately  dilated. There is mild asymmetric left ventricular hypertrophy. Left  ventricular diastolic parameters are consistent with Grade III diastolic  dysfunction (restrictive). Elevated left  ventricular end-diastolic pressure.   2. Right ventricular systolic function is normal. The right ventricular  size is moderately enlarged. There is normal pulmonary artery systolic  pressure.   3. Left atrial size was severely dilated.   4. Right atrial size was severely dilated.   5. The mitral valve gradient has . The mitral valve has been  repaired/replaced. Mild to moderate mitral valve regurgitation. Mild to  moderate mitral stenosis.   6. Tricuspid valve regurgitation is moderate.   7. The aortic valve is normal in structure. There is mild calcification  of the aortic valve. Aortic valve regurgitation is mild.    EKG:  EKG is not ordered today.   Recent Labs: 03/23/2021: ALT 25 12/31/2021: Hemoglobin 13.9; Platelets 118 01/25/2022: BUN 16; Creat 0.89; Potassium 3.7; Sodium 139   Recent Lipid Panel    Component Value Date/Time   CHOL 199 12/31/2021 0804   TRIG 65 12/31/2021 0804   HDL 79 12/31/2021 0804   CHOLHDL 2.5 12/31/2021 0804   VLDL 11 04/08/2016 0823   LDLCALC 105 (H) 12/31/2021 0804   Physical Exam:    VS:  BP 118/70   Pulse 71   Ht '5\' 10"'$  (1.778 m)   Wt 210 lb (95.3 kg)   SpO2 97%   BMI 30.13 kg/m     Wt Readings from Last 3 Encounters:  01/31/22 210 lb (95.3 kg)  01/25/22 209 lb (94.8 kg)  12/17/21 (P) 213 lb 9.6 oz (96.9 kg)    General: Elderly, NAD Neck: Negative for carotid bruits. No JVD Lungs:Clear to ausculation bilaterally. Breathing is unlabored. Cardiovascular: Irregular. No  murmurs Extremities: No edema. Neuro: Alert and oriented. No focal deficits. No facial asymmetry. MAE spontaneously. Psych: Responds to questions appropriately with normal affect.    ASSESSMENT/PLAN:    Non-rheumatic mitral valve regurgitation s/p TEER: Patient underwent TEER with MitraClip G4 XTW clip in the A2/P2 and placement of a MitraClip G4 NTW positioned in the lateral side of A2/P2 reducing MR from 4+ at baseline to a 1-2+ in the post procedural setting. Restarted on Xarelto due to PAF. Prior echo with worsening LVEF to 30-35% felt to be expected to some degree after Clip with a well functioning valve with a mean gradient of 6.5 mm hg which is stable. He is doing quite well however having some orthostatic dizziness with NYHA class II symptoms. Echo today with EF at 35-40% with stable Mitraclip placement with peak and mean gradients at 12 and 5 mmHg. MVA at 1.52cm2 consistent with mild mitral stenosis and very mild MR. Continue lifelong SBE. I will follow him with virtual visit in 3 weeks after med changes.   Dizziness: Noted to have mild orthostatic dizziness at home with low normal BPs. He is on a moderate amount of antihypertensive meds. Will reduce amlodipine from '10mg'$  to '5mg'$ , reduce carvedilol from 3m to 12.'5mg'$ , and reduce Lasix from '40mg'$  BID to '40mg'$  QD and follow response. Will leave him on Entresto 24/'26mg'$  dosing given stable renal and electrolyte function.    Persistent atrial fibrillation: Continue Xarelto. No bleeding in stool or urine.    Hx  of CHB s/p PPM: Followed by Dr. Curt Bears. Last interrogation 12/22/21 with normal device function.     Chronic systolic CHF: LVEF noted to be 35-40% in the post Mitraclip setting. He was started on [facility] 11/2021. Having some residual dizziness therefore we will decrease amlodipine, carvedilol, and Lasix.     Medication Adjustments/Labs and Tests Ordered: Current medicines are reviewed at length with the patient today.  Concerns regarding  medicines are outlined above.  No orders of the defined types were placed in this encounter.  Meds ordered this encounter  Medications   amLODipine (NORVASC) 5 MG tablet    Sig: Take 1 tablet (5 mg total) by mouth daily.    Dispense:  90 tablet    Refill:  3   carvedilol (COREG) 12.5 MG tablet    Sig: Take 1 tablet (12.5 mg total) by mouth 2 (two) times daily.    Dispense:  180 tablet    Refill:  3   furosemide (LASIX) 40 MG tablet    Sig: Take 1 tablet (40 mg total) by mouth daily.    Dispense:  90 tablet    Refill:  3    PROFILE UNTIL READY TO FILL... NEW DOSE    Patient Instructions  Medication Instructions:  Decrease your lasix to 40 mg once a day only Decrease your Amlodipine to 5 mg once a day  Decrease Carvedilol to 12.5 mg twice a day    *If you need a refill on your cardiac medications before your next appointment, please call your pharmacy*   Lab Work:  If you have labs (blood work) drawn today and your tests are completely normal, you will receive your results only by: [place] (if you have MyChart) OR A paper copy in the mail If you have any lab test that is abnormal or we need to change your treatment, we will call you to review the results.   Testing/Procedures:    Follow-Up: At [facility], you and your health needs are our priority.  As part of our continuing mission to provide you with exceptional heart care, we have created designated Provider Care Teams.  These Care Teams include your primary Cardiologist (physician) and Advanced Practice Providers (APPs -  Physician Assistants and Nurse Practitioners) who all work together to provide you with the care you need, when you need it.  We recommend signing up for the patient portal called "MyChart".  Sign up information is provided on this After Visit Summary.  MyChart is used to connect with patients for Virtual Visits (Telemedicine).  Patients are able to view lab/test results, encounter notes,  upcoming appointments, etc.  Non-urgent messages can be sent to your provider as well.   To learn more about what you can do with MyChart, go to [email or web address].     Important Information About Sugar        Signed, Kathyrn Drown, NP  02/01/2022 8:25 AM    [facility]

## 2022-01-31 ENCOUNTER — Ambulatory Visit (INDEPENDENT_AMBULATORY_CARE_PROVIDER_SITE_OTHER): Payer: Medicare Other | Admitting: Cardiology

## 2022-01-31 ENCOUNTER — Ambulatory Visit (HOSPITAL_COMMUNITY): Payer: Medicare Other | Attending: Internal Medicine

## 2022-01-31 VITALS — BP 118/70 | HR 71 | Ht 70.0 in | Wt 210.0 lb

## 2022-01-31 DIAGNOSIS — I10 Essential (primary) hypertension: Secondary | ICD-10-CM

## 2022-01-31 DIAGNOSIS — Z95818 Presence of other cardiac implants and grafts: Secondary | ICD-10-CM | POA: Diagnosis not present

## 2022-01-31 DIAGNOSIS — I340 Nonrheumatic mitral (valve) insufficiency: Secondary | ICD-10-CM | POA: Insufficient documentation

## 2022-01-31 DIAGNOSIS — Z9889 Other specified postprocedural states: Secondary | ICD-10-CM

## 2022-01-31 DIAGNOSIS — I5032 Chronic diastolic (congestive) heart failure: Secondary | ICD-10-CM | POA: Diagnosis not present

## 2022-01-31 DIAGNOSIS — I442 Atrioventricular block, complete: Secondary | ICD-10-CM

## 2022-01-31 DIAGNOSIS — I5042 Chronic combined systolic (congestive) and diastolic (congestive) heart failure: Secondary | ICD-10-CM | POA: Diagnosis not present

## 2022-01-31 DIAGNOSIS — I4821 Permanent atrial fibrillation: Secondary | ICD-10-CM | POA: Diagnosis not present

## 2022-01-31 LAB — ECHOCARDIOGRAM COMPLETE: S' Lateral: 3 cm

## 2022-01-31 MED ORDER — FUROSEMIDE 40 MG PO TABS: 40.0000 mg | ORAL_TABLET | Freq: Every day | ORAL | 3 refills | Status: DC

## 2022-01-31 MED ORDER — CARVEDILOL 12.5 MG PO TABS: 12.5000 mg | ORAL_TABLET | Freq: Two times a day (BID) | ORAL | 3 refills | Status: DC

## 2022-01-31 MED ORDER — AMLODIPINE BESYLATE 5 MG PO TABS: 5.0000 mg | ORAL_TABLET | Freq: Every day | ORAL | 3 refills | Status: DC

## 2022-01-31 NOTE — Patient Instructions (Signed)
Medication Instructions:  Decrease your lasix to 40 mg once a day only Decrease your Amlodipine to 5 mg once a day  Decrease Carvedilol to 12.5 mg twice a day    *If you need a refill on your cardiac medications before your next appointment, please call your pharmacy*   Lab Work:  If you have labs (blood work) drawn today and your tests are completely normal, you will receive your results only by: [place] (if you have MyChart) OR A paper copy in the mail If you have any lab test that is abnormal or we need to change your treatment, we will call you to review the results.   Testing/Procedures:    Follow-Up: At [facility], you and your health needs are our priority.  As part of our continuing mission to provide you with exceptional heart care, we have created designated Provider Care Teams.  These Care Teams include your primary Cardiologist (physician) and Advanced Practice Providers (APPs -  Physician Assistants and Nurse Practitioners) who all work together to provide you with the care you need, when you need it.  We recommend signing up for the patient portal called "MyChart".  Sign up information is provided on this After Visit Summary.  MyChart is used to connect with patients for Virtual Visits (Telemedicine).  Patients are able to view lab/test results, encounter notes, upcoming appointments, etc.  Non-urgent messages can be sent to your provider as well.   To learn more about what you can do with MyChart, go to [email or web address].     Important Information About Sugar

## 2022-02-01 ENCOUNTER — Telehealth: Payer: Self-pay | Admitting: Pharmacist

## 2022-02-01 NOTE — Telephone Encounter (Signed)
Kathyrn Drown D, NP  Please let the patient know that his echocardiogram looks great with stable clip placement with no need to make further changes other than what we have already discussed. I will speak to him at our follow up virtual visit in a few weeks.    Pt called and all information shared in above note.  Pt understood, and made aware of follow visit in a few weeks.    No follow up required at this time.

## 2022-02-01 NOTE — Progress Notes (Signed)
Chronic Care Management Pharmacy Assistant   Name: Tong Pieczynski.  MRN: [id number] DOB: 02-11-1932   Reason for Encounter: Disease State - Hypertension Call     Recent office visits:  01/25/22 Jenna Luo, MD - CHF - Labs were ordered. Follow up as scheduled.    Recent consult visits:  12/17/21 Diona Browner NP - Cardiology - CHF- Labs were ordered. Referral to heart and valve clinic placed - sacubitril-valsartan (ENTRESTO) 24-26 MG prescribed. Follow up in 3 months.   11/15/21 Minus Breeding, MD - Cardiology - Afib - Labs were ordered. Stopped Losartan and started sacubitril-valsartan (ENTRESTO) 49-51 MG prescribed. Follow up in 1 month.   Hospital visits:  None in previous 6 months  Medications: Outpatient Encounter Medications as of 02/01/2022  Medication Sig   acetaminophen (TYLENOL) 500 MG tablet Take 500 mg by mouth every 6 (six) hours as needed for mild pain, fever or headache.   albuterol (VENTOLIN HFA) 108 (90 Base) MCG/ACT inhaler INHALE 2 PUFFS EVERY 6 HOURS AS NEEDED FOR WHEEZING OR SHORTNESS OF BREATH   amLODipine (NORVASC) 5 MG tablet Take 1 tablet (5 mg total) by mouth daily.   carvedilol (COREG) 12.5 MG tablet Take 1 tablet (12.5 mg total) by mouth 2 (two) times daily.   ferrous sulfate 325 (65 FE) MG tablet Take 325 mg by mouth every other day.    fluticasone (FLONASE) 50 MCG/ACT nasal spray USE 1 SPRAY IN EACH NOSTRIL EVERY DAY   furosemide (LASIX) 40 MG tablet Take 1 tablet (40 mg total) by mouth daily.   guaiFENesin-dextromethorphan (ROBITUSSIN DM) 100-10 MG/5ML syrup Take 5 mLs by mouth every 4 (four) hours as needed for cough.   Multiple Vitamin (MULTIVITAMIN) tablet Take 1 tablet by mouth daily.   multivitamin-lutein (OCUVITE-LUTEIN) CAPS Take 1 capsule by mouth daily.   polyvinyl alcohol (LIQUIFILM TEARS) 1.4 % ophthalmic solution Place 1 drop into both eyes as needed for dry eyes.   potassium chloride (KLOR-CON) 10 MEQ tablet Take 1 tablet (10 mEq  total) by mouth daily.   sacubitril-valsartan (ENTRESTO) 24-26 MG Take 1 tablet by mouth 2 (two) times daily.   vitamin B-12 (CYANOCOBALAMIN) 1000 MCG tablet TAKE 1 TABLET EVERY DAY   XARELTO 20 MG TABS tablet TAKE 1 TABLET EVERY DAY WITH SUPPER   No facility-administered encounter medications on file as of 02/01/2022.    Current antihypertensive regimen:  sacubitril-valsartan (ENTRESTO) 24-26 MG furosemide (LASIX) 40 MG tablet amLODipine (NORVASC) 5 MG tablet carvedilol (COREG) 12.5 MG tablet  How often are you checking your Blood Pressure?  Patient reported checking blood pressures    Current home BP readings:    What recent interventions/DTPs have been made by any provider to improve Blood Pressure control since last CPP Visit:  Patient was discontinued off Losartan and placed on Entresto 24-26 on 11/15/21.   Any recent hospitalizations or ED visits since last visit with CPP?  Patient has not had any hospitalizations or ED visits since last visit with CPP.   What diet changes have been made to improve Blood Pressure Control?  Patient reported   What exercise is being done to improve your Blood Pressure Control?  Patient reported    Adherence Review: Is the patient currently on ACE/ARB medication? No Does the patient have >5 day gap between last estimated fill dates? No   Care Gaps  AWV: done 04/29/21 Colonoscopy: done 03/17/17 DM Eye Exam: N/A DM Foot Exam:  N/A Microalbumin: N/A HbgAIC: one 12/31/21 (6.1) DEXA:  N/A Mammogram: N/A   Star Rating Drugs: No star rating drugs noted   Future Appointments  Date Time Provider [place]  02/28/2022 12:00 PM [facility] STRUCTURAL HEART APP [facility]  03/23/2022  7:00 AM [facility] DEVICE REMOTES [facility]  04/18/2022  9:00 AM Minus Breeding, MD [facility]  06/22/2022  7:00 AM [facility] DEVICE REMOTES [facility]   Multiple attempts were made to contact  patient. Attempts were unsuccessful. / ls,CMA   Jobe Gibbon, [place] Pharmacist Assistant  [phone]

## 2022-02-01 NOTE — Telephone Encounter (Signed)
-----   Message from Tommie Raymond, NP sent at 02/01/2022 10:30 AM EDT ----- Please let the patient know that his echocardiogram looks great with stable clip placement with no need to make further changes other than what we have already discussed. I will speak to him at our follow up virtual visit in a few weeks.

## 2022-02-28 ENCOUNTER — Telehealth: Payer: Medicare Other

## 2022-03-14 ENCOUNTER — Ambulatory Visit: Payer: Medicare Other | Attending: Cardiology | Admitting: Cardiology

## 2022-03-14 VITALS — BP 110/60 | HR 62 | Ht 70.0 in | Wt 196.2 lb

## 2022-03-14 DIAGNOSIS — Z9889 Other specified postprocedural states: Secondary | ICD-10-CM | POA: Diagnosis not present

## 2022-03-14 DIAGNOSIS — R42 Dizziness and giddiness: Secondary | ICD-10-CM

## 2022-03-14 DIAGNOSIS — I340 Nonrheumatic mitral (valve) insufficiency: Secondary | ICD-10-CM | POA: Diagnosis not present

## 2022-03-14 DIAGNOSIS — I5042 Chronic combined systolic (congestive) and diastolic (congestive) heart failure: Secondary | ICD-10-CM

## 2022-03-14 DIAGNOSIS — I482 Chronic atrial fibrillation, unspecified: Secondary | ICD-10-CM | POA: Diagnosis not present

## 2022-03-14 DIAGNOSIS — Z95818 Presence of other cardiac implants and grafts: Secondary | ICD-10-CM | POA: Diagnosis not present

## 2022-03-14 DIAGNOSIS — I5032 Chronic diastolic (congestive) heart failure: Secondary | ICD-10-CM

## 2022-03-14 DIAGNOSIS — I10 Essential (primary) hypertension: Secondary | ICD-10-CM

## 2022-03-14 MED ORDER — LOSARTAN POTASSIUM 25 MG PO TABS: 25.0000 mg | ORAL_TABLET | Freq: Every day | ORAL | 3 refills | Status: DC

## 2022-03-14 MED ORDER — RIVAROXABAN 20 MG PO TABS: ORAL_TABLET | ORAL | 3 refills | Status: DC

## 2022-03-14 NOTE — Patient Instructions (Signed)
Medication Instructions:  Your physician has recommended you make the following change in your medication:  STOP ENTRESTO  START LOSARTAN 25 MG DAILY  *If you need a refill on your cardiac medications before your next appointment, please call your pharmacy*   Lab Work: NONE If you have labs (blood work) drawn today and your tests are completely normal, you will receive your results only by: [place] (if you have MyChart) OR A paper copy in the mail If you have any lab test that is abnormal or we need to change your treatment, we will call you to review the results.   Testing/Procedures: NONE   Follow-Up: At [facility], you and your health needs are our priority.  As part of our continuing mission to provide you with exceptional heart care, we have created designated Provider Care Teams.  These Care Teams include your primary Cardiologist (physician) and Advanced Practice Providers (APPs -  Physician Assistants and Nurse Practitioners) who all work together to provide you with the care you need, when you need it.  We recommend signing up for the patient portal called "MyChart".  Sign up information is provided on this After Visit Summary.  MyChart is used to connect with patients for Virtual Visits (Telemedicine).  Patients are able to view lab/test results, encounter notes, upcoming appointments, etc.  Non-urgent messages can be sent to your provider as well.   To learn more about what you can do with MyChart, go to [email or web address].    Your next appointment:   KEEP SCHEDULED FOLLOW-UP  Important Information About Sugar

## 2022-03-14 NOTE — Progress Notes (Unsigned)
HEART AND [place]                                     Cardiology Office Note:    Date:  03/14/2022   ID:  [name], DOB Dec 02, 1931, MRN [id number]  PCP:  [name], MD  [facility] Cardiologist:  Minus Breeding, MD / Dr. Burt Knack, MD (MitraClip)  [facility] Electrophysiologist:  Will Meredith Leeds, MD   Referring MD: [name], MD   Chief Complaint  Patient presents with   Follow-up    Dizziness    History of Present Illness:    Austin Gray. is a 88 y.o. male with a hx chronic persistent atrial fibrillation on Xarelto, CHB s/p PPM, HTN, chronic diastolic heart failure and severe mitral regurgitation who presents for 1 year follow s/p TEER  with Dr. Burt Knack 03/25/21.    Mr. Gries underwent an echocardiogram 12/07/20 which showed EF 55%, severe biatrial enlargement and severe mitral regurgitation. The mitral valve is myxomatous. The jet was eccentric and anteriorly directed with splay artifact suggestive of at least moderate regurgitation with a possible flail PMVL chord. 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 EROA 0.4cm2 with RVol 35m by PISA. No evidence of mitral stenosis.    He was seen by Dr. CBurt Knackon 12/30/20 for structural heart consultation and discussion of potential Mitral Valve TEER. The patient's transesophageal echo demonstrated findings consistent with severe mitral regurgitation secondary to degenerative changes of ruptured primary chordae tendonae and flail P2 segment of the mitral valve with eccentric MR. He did not appear to be particularly limited at that time with only mild shortness  of breath. He initially deferred procedure but then later changed his mind. L/RHC on 02/24/21 showed mild diffuse CAD and confirmed severe MR with large V waves (28 mm hg).   He underwent successful transcatheter edge-to-edge mitral valve repair with placement of a MitraClip G4 XTW clip in the A2/P2 and placement of a MitraClip G4 NTW positioned in the lateral side of A2/P2 reducing MR from 4+ at baseline to a 1-2+ in the post procedural setting. He was placed back on Xarelto for anticoagulation. Post procedure echocardiogram performed prior to hospital discharge showed mild eccentric residual MR however overall the MitraClip result showed excellent reduction in the degree of mitral regurgitation.    He was seen by myself 04/21/2021 at which time he was doing well with no complaints. Since that time he has been seen several times by Dr. HPercival Spanishas well as Dr. CCurt Bearsand his PCP.    On last evaluation he was reporting intermittent dizziness since starting Enresto. He was initially started on mid dose 49/'51mg'$  however this was changed to 24/26 dosing given his symptoms. On my evaluation he was also taking amlodipine '10mg'$ , carvedilol '25mg'$ , and Lasix '40mg'$  BID. Given this, his amlodipine was reduced to '5mg'$ , carvedilol to 12.'5mg'$ , and Lasix to QD dosing.   Today he is here with a neighbor. He states that his dizziness is somewhat improved however continues to still have symptoms mainly after taking his Entresto in the morning. He will have dizziness that  starts about an hour after taking his dose and dissipates several hours later. He has not had any falls. We discussed stopping Entresto altogether. He is looking forward to this. He has no chest pain, palpitations, LE edema, orthopnea, SOB, or syncope.    Past Medical History:  Diagnosis Date   Anxiety    Atrial fibrillation ([place])    Chronic   Cataracts, bilateral    to have left eye cataract surgery on 03/21/17    CHF (congestive heart failure), NYHA class  II, chronic, diastolic (HCC)    Colon polyps    last colonoscopy 2015 (tubular adenoma)   Depression    GERD (gastroesophageal reflux disease)    H/O: GI bleed    Recurrent   Hemorrhoids    History of BPH    History of kidney stones    hx of x 1  passed stones   Hypertension    Pernicious anemia    Presence of permanent cardiac pacemaker    PVC (premature ventricular contraction)    S/P mitral valve clip implantation 03/25/2021   Successful transcatheter edge-to-edge mitral valve repair with placement of a MitraClip G4 XTW clip positioned A2/P2 and placement of a MitraClip G4 NTW clip positioned on the lateral side of A2/P2, reducing mitral regurgitation from 4+ at baseline to 1-2+ post procedure   Severe mitral regurgitation    Vitamin D deficiency     Past Surgical History:  Procedure Laterality Date   COLONOSCOPY WITH PROPOFOL N/A 03/17/2017   Procedure: COLONOSCOPY WITH PROPOFOL;  Surgeon: Carol Ada, MD;  Location: [facility];  Service: Endoscopy;  Laterality: N/A;   [place]; ~ 2002   left; right   MITRAL VALVE REPAIR N/A 03/25/2021   Procedure: MITRAL VALVE REPAIR;  Surgeon: Sherren Mocha, MD;  Location: [place] [facility];  Service: Cardiovascular;  Laterality: N/A;   MULTIPLE EXTRACTIONS WITH ALVEOLOPLASTY N/A 03/18/2021   Procedure: MULTIPLE EXTRACTION WITH ALVEOLOPLASTY;  Surgeon: Charlaine Dalton, DMD;  Location: [place];  Service: Dentistry;  Laterality: N/A;   PACEMAKER IMPLANT N/A 03/11/2019   Procedure: PACEMAKER IMPLANT;  Surgeon: Constance Haw, MD;  Location: [place] [facility];  Service: Cardiovascular;  Laterality: N/A;   RIGHT/LEFT HEART CATH AND CORONARY ANGIOGRAPHY N/A 02/24/2021   Procedure: RIGHT/LEFT HEART CATH AND CORONARY ANGIOGRAPHY;  Surgeon: Sherren Mocha, MD;  Location: [place] [facility];  Service: Cardiovascular;  Laterality: N/A;   TEE WITHOUT CARDIOVERSION N/A 12/23/2020   Procedure:  TRANSESOPHAGEAL ECHOCARDIOGRAM (TEE);  Surgeon: Freada Bergeron, MD;  Location: [facility];  Service: Cardiovascular;  Laterality: N/A;   TEE WITHOUT CARDIOVERSION N/A 03/25/2021   Procedure: TRANSESOPHAGEAL ECHOCARDIOGRAM (TEE);  Surgeon: Sherren Mocha, MD;  Location: [place] [facility];  Service: Cardiovascular;  Laterality: N/A;   TEMPORARY PACEMAKER N/A 03/10/2019   Procedure: TEMPORARY PACEMAKER;  Surgeon: Martinique, Peter M, MD;  Location: [place] [facility];  Service: Cardiovascular;  Laterality: N/A;    Current Medications: Current Meds  Medication Sig   acetaminophen (TYLENOL) 500 MG tablet Take 500 mg by mouth every 6 (six) hours as needed for mild pain, fever or headache.   albuterol (VENTOLIN HFA) 108 (90 Base) MCG/ACT inhaler INHALE 2 PUFFS EVERY 6 HOURS AS NEEDED FOR WHEEZING OR SHORTNESS OF BREATH   amLODipine (NORVASC) 5 MG tablet Take 1 tablet (5 mg total) by mouth daily.   carvedilol (COREG) 12.5 MG tablet Take 1 tablet (12.5 mg total) by  mouth 2 (two) times daily.   ferrous sulfate 325 (65 FE) MG tablet Take 325 mg by mouth every other day.    fluticasone (FLONASE) 50 MCG/ACT nasal spray USE 1 SPRAY IN EACH NOSTRIL EVERY DAY   furosemide (LASIX) 40 MG tablet Take 1 tablet (40 mg total) by mouth daily.   guaiFENesin-dextromethorphan (ROBITUSSIN DM) 100-10 MG/5ML syrup Take 5 mLs by mouth every 4 (four) hours as needed for cough.   losartan (COZAAR) 25 MG tablet Take 1 tablet (25 mg total) by mouth daily.   Multiple Vitamin (MULTIVITAMIN) tablet Take 1 tablet by mouth daily.   multivitamin-lutein (OCUVITE-LUTEIN) CAPS Take 1 capsule by mouth daily.   polyvinyl alcohol (LIQUIFILM TEARS) 1.4 % ophthalmic solution Place 1 drop into both eyes as needed for dry eyes.   potassium chloride (KLOR-CON) 10 MEQ tablet Take 1 tablet (10 mEq total) by mouth daily.   vitamin B-12 (CYANOCOBALAMIN) 1000 MCG tablet TAKE 1 TABLET EVERY DAY   [DISCONTINUED] sacubitril-valsartan (ENTRESTO)  24-26 MG Take 1 tablet by mouth 2 (two) times daily.   [DISCONTINUED] XARELTO 20 MG TABS tablet TAKE 1 TABLET EVERY DAY WITH SUPPER     Allergies:   Patient has no known allergies.   Social History   Socioeconomic History   Marital status: Widowed    Spouse name: Not on file   Number of children: 1   Years of education: Not on file   Highest education level: Not on file  Occupational History   Occupation: Retired  Tobacco Use   Smoking status: Never   Smokeless tobacco: Former    Types: Snuff   Tobacco comments:    03/17/21 "still use snuff daily"  Vaping Use   Vaping Use: Never used  Substance and Sexual Activity   Alcohol use: No   Drug use: No   Sexual activity: Never    Comment: widower, lives with granddaughter and great grandchildren.  Other Topics Concern   Not on file  Social History Narrative   Lives with grand daughter.         Epworth Sleepiness Scale = 7 (as of 09/21/2015)   Social Determinants of Health   Financial Resource Strain: Low Risk  (04/29/2021)   Overall Financial Resource Strain (CARDIA)    Difficulty of Paying Living Expenses: Not hard at all  Food Insecurity: No Food Insecurity (04/29/2021)   Hunger Vital Sign    Worried About Running Out of Food in the Last Year: Never true    Ran Out of Food in the Last Year: Never true  Transportation Needs: No Transportation Needs (04/29/2021)   PRAPARE - Hydrologist (Medical): No    Lack of Transportation (Non-Medical): No  Physical Activity: Sufficiently Active (04/29/2021)   Exercise Vital Sign    Days of Exercise per Week: 5 days    Minutes of Exercise per Session: 30 min  Stress: No Stress Concern Present (04/29/2021)   [place]    Feeling of Stress : Not at all  Social Connections: Socially Isolated (04/29/2021)   Social Connection and Isolation Panel [NHANES]    Frequency of Communication with  Friends and Family: More than three times a week    Frequency of Social Gatherings with Friends and Family: More than three times a week    Attends Religious Services: Never    Marine scientist or Organizations: No    Attends Archivist Meetings: Never  Marital Status: Widowed     Family History: The patient's family history includes Cancer in his mother; Stroke (age of onset: 49) in his father.  ROS:   Please see the history of present illness.    All other systems reviewed and are negative.  EKGs/Labs/Other Studies Reviewed:    The following studies were reviewed today:   Echocardiogram 01/31/22:    1. Left ventricular ejection fraction, by estimation, is 35 to 40%. The  left ventricle has moderately decreased function. The left ventricle  demonstrates global hypokinesis. There is mild concentric left ventricular  hypertrophy. Left ventricular  diastolic parameters are indeterminate.   2. Right ventricular systolic function is normal. The right ventricular  size is normal.   3. Left atrial size was severely dilated.   4. Right atrial size was severely dilated.   5. S/p Mitra Clip (G4XTW, G4NTW 03/25/21). Peak and mean gradients through  the valve are 12 and 5 mm Hg respectively. MVA (VTI) is 1.52 cm2 (HR 76  bpm) consistent with mild mitral stenosis. Very mild MR directed  anteriorly. . The mitral valve has been  repaired/replaced. Mild mitral valve regurgitation.   6. The aortic valve is tricuspid. Aortic valve regurgitation is mild.  Aortic valve sclerosis/calcification is present, without any evidence of  aortic stenosis.   7. The inferior vena cava is normal in size with greater than 50%  respiratory variability, suggesting right atrial pressure of 3 mmHg.   Comparison(s): The left ventricular function is unchanged.    Mitral Valve Repair (TEER) 03/25/21:   Successful transcatheter edge-to-edge mitral valve repair with placement of a MitraClip G4 XTW  clip positioned A2/P2 and placement of a MitraClip G4 NTW clip positioned on the lateral side of A2/P2, reducing mitral regurgitation from 4+ at baseline to 1-2+ post procedure   Recommend to resume Rivaroxaban, at currently prescribed dose and frequency on 03/25/2021. Concurrent antiplatelet therapy not recommended _____________   Echo 03/26/21:    1. Left ventricular ejection fraction, by estimation, is 50 to 55%. The  left ventricle has low normal function. The left ventricle demonstrates  global hypokinesis. Left ventricular diastolic function could not be  evaluated.   2. Right ventricular systolic function is mildly reduced. The right  ventricular size is mildly enlarged. There is moderately elevated  pulmonary artery systolic pressure.   3. Left atrial size was severely dilated.   4. Right atrial size was severely dilated.   5. There are two well-seated MitraClips in the A2P2 (middle scallop)  area. There is a residual mitral insufficiency jet, medial to the clips,  mild in severity. The mitral valve has been repaired/replaced. Mild mitral  valve regurgitation. Mild mitral  stenosis. The mean mitral valve gradient is 4.7 mmHg at 70 bpm. Pressure  half time is 121 ms.   6. Tricuspid valve regurgitation is moderate to severe.   7. The aortic valve is tricuspid. There is mild calcification of the  aortic valve. There is mild thickening of the aortic valve. Aortic valve  regurgitation is mild. No aortic stenosis is present.   8. The inferior vena cava is dilated in size with <50% respiratory  variability, suggesting right atrial pressure of 15 mmHg.    Comparison(s): Mildly elevated mitral valve gradients and mild residual  mitral insufficiency is unchanged from yesterday's post=procedure  findings. There is evidence of increased right ventricular systolic  pressure/pulmonary artery pressure and increased   right atrial pressure.    R/LHC 02/24/21:  Prox RCA lesion is 30%  stenosed.   1.  Mild diffuse coronary irregularity/plaquing with no significant obstructive disease 2.  Known severe mitral regurgitation with large V waves (28 mmHg) 3.  Normal LV pressure, essentially normal right heart pressures  Plan: Continue evaluation for transcatheter edge-to-edge mitral valve repair   ____________________   Echo 04/21/21 IMPRESSIONS   1. The LV function is difficult to assess but appears to be moderately  depressed. . Left ventricular ejection fraction, by estimation, is 30 to  35%. The left ventricle has moderately decreased function. The left  ventricle demonstrates global hypokinesis. The left ventricular internal cavity size was moderately  dilated. There is mild asymmetric left ventricular hypertrophy. Left  ventricular diastolic parameters are consistent with Grade III diastolic  dysfunction (restrictive). Elevated left  ventricular end-diastolic pressure.   2. Right ventricular systolic function is normal. The right ventricular  size is moderately enlarged. There is normal pulmonary artery systolic  pressure.   3. Left atrial size was severely dilated.   4. Right atrial size was severely dilated.   5. The mitral valve gradient has . The mitral valve has been  repaired/replaced. Mild to moderate mitral valve regurgitation. Mild to  moderate mitral stenosis.   6. Tricuspid valve regurgitation is moderate.   7. The aortic valve is normal in structure. There is mild calcification  of the aortic valve. Aortic valve regurgitation is mild.     EKG:  EKG is not ordered today.    Recent Labs: 03/23/2021: ALT 25 12/31/2021: Hemoglobin 13.9; Platelets 118 01/25/2022: BUN 16; Creat 0.89; Potassium 3.7; Sodium 139   Recent Lipid Panel    Component Value Date/Time   CHOL 199 12/31/2021 0804   TRIG 65 12/31/2021 0804   HDL 79 12/31/2021 0804   CHOLHDL 2.5 12/31/2021 0804   VLDL 11 04/08/2016 0823   LDLCALC 105 (H) 12/31/2021 0804   Physical Exam:     VS:  BP 110/60   Pulse 62   Ht '5\' 10"'$  (1.778 m)   Wt 196 lb 3.2 oz (89 kg)   SpO2 96%   BMI 28.15 kg/m     Wt Readings from Last 3 Encounters:  03/14/22 196 lb 3.2 oz (89 kg)  01/31/22 210 lb (95.3 kg)  01/25/22 209 lb (94.8 kg)    General: Elderly, NAD Neck: Negative for carotid bruits. No JVD Lungs:Clear to ausculation bilaterally. Breathing is unlabored. Cardiovascular: Irregularly irregular. No murmurs Extremities: Trace BLE edema.  Neuro: Alert and oriented. No focal deficits. No facial asymmetry. MAE spontaneously. Psych: Responds to questions appropriately with normal affect.    ASSESSMENT/PLAN:    Non-rheumatic mitral valve regurgitation s/p TEER: Patient underwent TEER with MitraClip G4 XTW clip in the A2/P2 and placement of a MitraClip G4 NTW positioned in the lateral side of A2/P2 reducing MR from 4+ at baseline to a 1-2+ in the post procedural setting. Restarted on Xarelto due to PAF. Prior echo with worsening LVEF to 30-35% felt to be expected to some degree after Clip with a well functioning valve with a mean gradient of 6.5 mm hg which is stable. Last echo with EF at 35-40% with stable Mitraclip placement with peak and mean gradients at 12 and 5 mmHg. MVA at 1.52cm2 consistent with mild mitral stenosis and very mild MR. Continue lifelong SBE. No ASA given Xarelto.    Dizziness: Continues to have dizziness after taking Entresto. Other antihypertensives reduced at last OV with only mild improvement. Will stop Entresto and start  Losartan '25mg'$  QD. Continue amlodipine '5mg'$ , carvedilol 12.'5mg'$ , and Lasix '40mg'$  QD. Follow up with Dr. Percival Spanish 04/2022   Persistent atrial fibrillation: Continue Xarelto. No bleeding in stool or urine.    Hx of CHB s/p PPM: Followed by Dr. Curt Bears. Last interrogation 12/22/21 with normal device function.     Chronic systolic CHF: LVEF noted to be 35-40% in the post Mitraclip setting. He was started on Entresto 11/2021 although we will stop this today  in the setting of persistent dizziness. Patient wishes to stop at this time. Continue amlodipine, carvedilol, and Lasix. Will add Losartan '25mg'$  QD. He was previously taking '50mg'$  QD.     Medication Adjustments/Labs and Tests Ordered: Current medicines are reviewed at length with the patient today.  Concerns regarding medicines are outlined above.  No orders of the defined types were placed in this encounter.  Meds ordered this encounter  Medications   rivaroxaban (XARELTO) 20 MG TABS tablet    Sig: TAKE 1 TABLET EVERY DAY WITH SUPPER    Dispense:  90 tablet    Refill:  3   losartan (COZAAR) 25 MG tablet    Sig: Take 1 tablet (25 mg total) by mouth daily.    Dispense:  90 tablet    Refill:  3    Patient Instructions  Medication Instructions:  Your physician has recommended you make the following change in your medication:  STOP ENTRESTO  START LOSARTAN 25 MG DAILY  *If you need a refill on your cardiac medications before your next appointment, please call your pharmacy*   Lab Work: NONE If you have labs (blood work) drawn today and your tests are completely normal, you will receive your results only by: [place] (if you have MyChart) OR A paper copy in the mail If you have any lab test that is abnormal or we need to change your treatment, we will call you to review the results.   Testing/Procedures: NONE   Follow-Up: At [facility], you and your health needs are our priority.  As part of our continuing mission to provide you with exceptional heart care, we have created designated Provider Care Teams.  These Care Teams include your primary Cardiologist (physician) and Advanced Practice Providers (APPs -  Physician Assistants and Nurse Practitioners) who all work together to provide you with the care you need, when you need it.  We recommend signing up for the patient portal called "MyChart".  Sign up information is provided on this After Visit Summary.  MyChart  is used to connect with patients for Virtual Visits (Telemedicine).  Patients are able to view lab/test results, encounter notes, upcoming appointments, etc.  Non-urgent messages can be sent to your provider as well.   To learn more about what you can do with MyChart, go to [email or web address].    Your next appointment:   KEEP SCHEDULED FOLLOW-UP  Important Information About Sugar         Signed, Kathyrn Drown, NP  03/14/2022 2:16 PM    [facility]

## 2022-03-23 ENCOUNTER — Ambulatory Visit: Payer: Medicare Other | Attending: *Deleted

## 2022-03-23 DIAGNOSIS — I442 Atrioventricular block, complete: Secondary | ICD-10-CM | POA: Diagnosis not present

## 2022-03-23 LAB — CUP PACEART REMOTE DEVICE CHECK
Battery Remaining Longevity: 97 mo
Battery Remaining Percentage: 77 %
Battery Voltage: 2.99 V
Brady Statistic RV Percent Paced: 96 %
Date Time Interrogation Session: 20230906020017
Implantable Lead Implant Date: 20200824
Implantable Lead Location: 753860
Implantable Pulse Generator Implant Date: 20200824
Lead Channel Impedance Value: 480 Ohm
Lead Channel Pacing Threshold Amplitude: 1.125 V
Lead Channel Pacing Threshold Pulse Width: 0.5 ms
Lead Channel Sensing Intrinsic Amplitude: 5 mV
Lead Channel Setting Pacing Amplitude: 1.375
Lead Channel Setting Pacing Pulse Width: 0.5 ms
Lead Channel Setting Sensing Sensitivity: 0.7 mV
Pulse Gen Model: 1272
Pulse Gen Serial Number: 9140133

## 2022-04-11 NOTE — Progress Notes (Signed)
Remote pacemaker transmission.   

## 2022-04-18 ENCOUNTER — Ambulatory Visit: Payer: Medicare Other | Admitting: Cardiology

## 2022-04-20 ENCOUNTER — Other Ambulatory Visit: Payer: Self-pay | Admitting: *Deleted

## 2022-04-20 MED ORDER — POTASSIUM CHLORIDE CRYS ER 10 MEQ PO TBCR: 10.0000 meq | EXTENDED_RELEASE_TABLET | Freq: Every day | ORAL | 3 refills | Status: DC

## 2022-04-24 NOTE — Progress Notes (Deleted)
Cardiology Office Note   Date:  04/26/2022   ID:  Austin Recker., DOB 08-Jul-1932, MRN [id number]  PCP:  [name], MD  Cardiologist:   Minus Breeding, MD  APP:      No chief complaint on file.     History of Present Illness: Austin Gray. is a 88 y.o. male who presents for evaluation of atrial fib and acute on chronic systolic and diastolic HF.  He was admitted on 09/05/2017 after he presented to his PCPs office with bradycardia and symptoms of volume overload.  Initial lab work also suggested acute kidney injury with creatinine elevation up to 1.5.  His heart rate was in the 30s in the ER.  His beta-blocker was held during the admission.  His ACE inhibitor was discontinued.  Echocardiogram obtained on 09/06/2017 showed mild LVH, EF 55-60%, moderate MR, moderate TR, PA peak pressure 36 mmHg.  He had normalization of the renal function by 09/15/2017.  He had CHB and a permanent pacemaker in August of 2020.  He had successful transcatheter edge-to-edge mitral valve repair with a MitraClip G4.    At the last visit I started [facility] for BP control. He became dizzy and had to have this dose reduced and then discontinued.  Norvasc dose was reduced.  He was put on ARB.  He returns for follow up.  ***     ***  he has done well.  He has some vision trouble.  He has some balance issues.  Gets around with a walker.  He comes today with a caregiver but apparently he lives by himself.  Granddaughter checks on him.  The patient denies any new symptoms such as chest discomfort, neck or arm discomfort. There has been no new shortness of breath, PND or orthopnea. There have been no reported palpitations, presyncope or syncope.      Past Medical History:  Diagnosis Date   Anxiety    Atrial fibrillation ([place])    Chronic   Cataracts, bilateral    to have left eye cataract surgery on 03/21/17    CHF (congestive heart failure), NYHA class II, chronic, diastolic (HCC)    Colon  polyps    last colonoscopy 2015 (tubular adenoma)   Depression    GERD (gastroesophageal reflux disease)    H/O: GI bleed    Recurrent   Hemorrhoids    History of BPH    History of kidney stones    hx of x 1  passed stones   Hypertension    Pernicious anemia    Presence of permanent cardiac pacemaker    PVC (premature ventricular contraction)    S/P mitral valve clip implantation 03/25/2021   Successful transcatheter edge-to-edge mitral valve repair with placement of a MitraClip G4 XTW clip positioned A2/P2 and placement of a MitraClip G4 NTW clip positioned on the lateral side of A2/P2, reducing mitral regurgitation from 4+ at baseline to 1-2+ post procedure   Severe mitral regurgitation    Vitamin D deficiency     Past Surgical History:  Procedure Laterality Date   COLONOSCOPY WITH PROPOFOL N/A 03/17/2017   Procedure: COLONOSCOPY WITH PROPOFOL;  Surgeon: Carol Ada, MD;  [facility];  Service: Endoscopy;  Laterality: N/A;   [place]; ~ 2002   left; right   MITRAL VALVE REPAIR N/A 03/25/2021   Procedure: MITRAL VALVE REPAIR;  Surgeon: Sherren Mocha, MD;  Location: [place] [facility];  Service: Cardiovascular;  Laterality: N/A;   MULTIPLE EXTRACTIONS WITH ALVEOLOPLASTY N/A 03/18/2021   Procedure: MULTIPLE EXTRACTION WITH ALVEOLOPLASTY;  Surgeon: Charlaine Dalton, DMD;  Location: [place];  Service: Dentistry;  Laterality: N/A;   PACEMAKER IMPLANT N/A 03/11/2019   Procedure: PACEMAKER IMPLANT;  Surgeon: Constance Haw, MD;  Location: [place] [facility];  Service: Cardiovascular;  Laterality: N/A;   RIGHT/LEFT HEART CATH AND CORONARY ANGIOGRAPHY N/A 02/24/2021   Procedure: RIGHT/LEFT HEART CATH AND CORONARY ANGIOGRAPHY;  Surgeon: Sherren Mocha, MD;  Location: [place] [facility];  Service: Cardiovascular;  Laterality: N/A;   TEE WITHOUT CARDIOVERSION N/A 12/23/2020   Procedure: TRANSESOPHAGEAL ECHOCARDIOGRAM (TEE);  Surgeon:  Freada Bergeron, MD;  Location: [facility];  Service: Cardiovascular;  Laterality: N/A;   TEE WITHOUT CARDIOVERSION N/A 03/25/2021   Procedure: TRANSESOPHAGEAL ECHOCARDIOGRAM (TEE);  Surgeon: Sherren Mocha, MD;  Location: [place] [facility];  Service: Cardiovascular;  Laterality: N/A;   TEMPORARY PACEMAKER N/A 03/10/2019   Procedure: TEMPORARY PACEMAKER;  Surgeon: Martinique, Peter M, MD;  Location: [place] [facility];  Service: Cardiovascular;  Laterality: N/A;     Current Outpatient Medications  Medication Sig Dispense Refill   acetaminophen (TYLENOL) 500 MG tablet Take 500 mg by mouth every 6 (six) hours as needed for mild pain, fever or headache.     albuterol (VENTOLIN HFA) 108 (90 Base) MCG/ACT inhaler INHALE 2 PUFFS EVERY 6 HOURS AS NEEDED FOR WHEEZING OR SHORTNESS OF BREATH 18 g 3   amLODipine (NORVASC) 5 MG tablet Take 1 tablet (5 mg total) by mouth daily. 90 tablet 3   carvedilol (COREG) 12.5 MG tablet Take 1 tablet (12.5 mg total) by mouth 2 (two) times daily. 180 tablet 3   ferrous sulfate 325 (65 FE) MG tablet Take 325 mg by mouth every other day.      fluticasone (FLONASE) 50 MCG/ACT nasal spray USE 1 SPRAY IN EACH NOSTRIL EVERY DAY 32 g 1   furosemide (LASIX) 40 MG tablet Take 1 tablet (40 mg total) by mouth daily. 90 tablet 3   guaiFENesin-dextromethorphan (ROBITUSSIN DM) 100-10 MG/5ML syrup Take 5 mLs by mouth every 4 (four) hours as needed for cough.     losartan (COZAAR) 25 MG tablet Take 1 tablet (25 mg total) by mouth daily. 90 tablet 3   Multiple Vitamin (MULTIVITAMIN) tablet Take 1 tablet by mouth daily.     multivitamin-lutein (OCUVITE-LUTEIN) CAPS Take 1 capsule by mouth daily.     polyvinyl alcohol (LIQUIFILM TEARS) 1.4 % ophthalmic solution Place 1 drop into both eyes as needed for dry eyes.     potassium chloride (KLOR-CON M) 10 MEQ tablet Take 1 tablet (10 mEq total) by mouth daily. 90 tablet 3   rivaroxaban (XARELTO) 20 MG TABS tablet TAKE 1 TABLET EVERY DAY  WITH SUPPER 90 tablet 3   vitamin B-12 (CYANOCOBALAMIN) 1000 MCG tablet TAKE 1 TABLET EVERY DAY 90 tablet 3   No current facility-administered medications for this visit.    Allergies:   Patient has no known allergies.    ROS:  Please see the history of present illness.   Otherwise, review of systems are positive for ***.   All other systems are reviewed and negative.    PHYSICAL EXAM: VS:  There were no vitals taken for this visit. , BMI There is no height or weight on file to calculate BMI.  GENERAL:  Well appearing NECK:  No jugular venous distention, waveform within normal limits, carotid upstroke  brisk and symmetric, no bruits, no thyromegaly LUNGS:  Clear to auscultation bilaterally CHEST:  Unremarkable HEART:  PMI not displaced or sustained,S1 and S2 within normal limits, no S3, , no clicks, no rubs, *** murmurs, irregular  ABD:  Flat, positive bowel sounds normal in frequency in pitch, no bruits, no rebound, no guarding, no midline pulsatile mass, no hepatomegaly, no splenomegaly EXT:  2 plus pulses throughout, no edema, no cyanosis no clubbing     ***GENERAL:  Well appearing NECK:  No jugular venous distention, waveform within normal limits, carotid upstroke brisk and symmetric, no bruits, no thyromegaly LUNGS:  Clear to auscultation bilaterally CHEST:  Unremarkable HEART:  PMI not displaced or sustained,S1 and S2 within normal limits, no S3, no clicks, no rubs, 3 out of 6 apical systolic murmur radiating to the axilla, murmur ABD:  Flat, positive bowel sounds normal in frequency in pitch, no bruits, no rebound, no guarding, no midline pulsatile mass, no hepatomegaly, no splenomegaly EXT:  2 plus pulses throughout, no edema, no cyanosis no clubbing   EKG:  EKG is *** ordered today. ***   Recent Labs: 12/31/2021: Hemoglobin 13.9; Platelets 118 01/25/2022: BUN 16; Creat 0.89; Potassium 3.7; Sodium 139    Lipid Panel    Component Value Date/Time   CHOL 199 12/31/2021  0804   TRIG 65 12/31/2021 0804   HDL 79 12/31/2021 0804   CHOLHDL 2.5 12/31/2021 0804   VLDL 11 04/08/2016 0823   LDLCALC 105 (H) 12/31/2021 0804      Wt Readings from Last 3 Encounters:  03/14/22 196 lb 3.2 oz (89 kg)  01/31/22 210 lb (95.3 kg)  01/25/22 209 lb (94.8 kg)      Other studies Reviewed: Additional studies/ records that were reviewed today include:  *** Review of the above records demonstrates: See elsewhere   ASSESSMENT AND PLAN:  ATRIAL FIB:    CHA2DS2-VASc Score and unadjusted Ischemic Stroke Rate (% per year) is equal to 3.2 % stroke rate/year from a score of 3.   ***  I will check a CBC and basic metabolic profile today.  He seems to be tolerating anticoagulation.  He does not notice palpitations.   MR:    He has had MitraClip.  ***   We can make sure that he has follow-up after the next appointment in the [facility] and with an echocardiogram.   He has had some stable mild gradient and regurgitation.  He understands endocarditis prophylaxis. \  CHB/PERMANENT PACEMAKER:    ***   He is up-to-date with follow-up and recheck he has a follow-up remote scheduled in June.   EDEMA:     This is mild and chronic.  ***  No change in therapy.  He understands salt restriction.   HTN:     ***  This is being managed in the context of treating his CHF   CHRONIC DIASTOLIC AND SYSTOLIC HF:  ***      He has a moderately reduced ejection fraction and has been stable.  I am going to switch him from Cozaar to [place] 49/51 twice daily.  He will be getting a basic metabolic profile today.  When he comes back in a month or so would like to check his blood pressure and check another basic metabolic profile.  We might be able to titrate up further.    Current medicines are reviewed at length with the patient today.  The patient does not have concerns regarding medicines.  The following changes have  been made:  ***   Labs/ tests ordered today include:   ***   No orders of  the defined types were placed in this encounter.    Disposition:   FU with app in ***  month.    Signed, Minus Breeding, MD  04/26/2022 1:53 PM    [facility]

## 2022-04-26 ENCOUNTER — Ambulatory Visit: Payer: Medicare Other | Attending: Cardiology | Admitting: Cardiology

## 2022-04-26 DIAGNOSIS — M7989 Other specified soft tissue disorders: Secondary | ICD-10-CM

## 2022-04-26 DIAGNOSIS — I5042 Chronic combined systolic (congestive) and diastolic (congestive) heart failure: Secondary | ICD-10-CM

## 2022-04-26 DIAGNOSIS — Z9889 Other specified postprocedural states: Secondary | ICD-10-CM

## 2022-04-26 DIAGNOSIS — I482 Chronic atrial fibrillation, unspecified: Secondary | ICD-10-CM

## 2022-04-26 DIAGNOSIS — I10 Essential (primary) hypertension: Secondary | ICD-10-CM

## 2022-04-27 ENCOUNTER — Telehealth: Payer: Self-pay | Admitting: Pharmacist

## 2022-04-27 NOTE — Progress Notes (Signed)
Chronic Care Management Pharmacy Assistant   Name: Austin Gray.  MRN: [id number] DOB: Dec 14, 1931   Reason for Encounter: Disease State - Hypertension Call    Recent office visits:  None noted.   Recent consult visits:  03/14/22 Hale Bogus, NP - Cardiology - Mitral insufficiency - losartan (COZAAR) 25 MG tablet prescribed. STOP Entresto. Follow up as scheduled.   Hospital visits:  None in previous 6 months  Medications: Outpatient Encounter Medications as of 04/27/2022  Medication Sig   acetaminophen (TYLENOL) 500 MG tablet Take 500 mg by mouth every 6 (six) hours as needed for mild pain, fever or headache.   albuterol (VENTOLIN HFA) 108 (90 Base) MCG/ACT inhaler INHALE 2 PUFFS EVERY 6 HOURS AS NEEDED FOR WHEEZING OR SHORTNESS OF BREATH   amLODipine (NORVASC) 5 MG tablet Take 1 tablet (5 mg total) by mouth daily.   carvedilol (COREG) 12.5 MG tablet Take 1 tablet (12.5 mg total) by mouth 2 (two) times daily.   ferrous sulfate 325 (65 FE) MG tablet Take 325 mg by mouth every other day.    fluticasone (FLONASE) 50 MCG/ACT nasal spray USE 1 SPRAY IN EACH NOSTRIL EVERY DAY   furosemide (LASIX) 40 MG tablet Take 1 tablet (40 mg total) by mouth daily.   guaiFENesin-dextromethorphan (ROBITUSSIN DM) 100-10 MG/5ML syrup Take 5 mLs by mouth every 4 (four) hours as needed for cough.   losartan (COZAAR) 25 MG tablet Take 1 tablet (25 mg total) by mouth daily.   Multiple Vitamin (MULTIVITAMIN) tablet Take 1 tablet by mouth daily.   multivitamin-lutein (OCUVITE-LUTEIN) CAPS Take 1 capsule by mouth daily.   polyvinyl alcohol (LIQUIFILM TEARS) 1.4 % ophthalmic solution Place 1 drop into both eyes as needed for dry eyes.   potassium chloride (KLOR-CON M) 10 MEQ tablet Take 1 tablet (10 mEq total) by mouth daily.   rivaroxaban (XARELTO) 20 MG TABS tablet TAKE 1 TABLET EVERY DAY WITH SUPPER   vitamin B-12 (CYANOCOBALAMIN) 1000 MCG tablet TAKE 1 TABLET EVERY DAY   No  facility-administered encounter medications on file as of 04/27/2022.    Current antihypertensive regimen:  losartan (COZAAR) 25 MG tablet furosemide (LASIX) 40 MG tablet carvedilol (COREG) 12.5 MG tablet amLODipine (NORVASC) 5 MG tablet  How often are you checking your Blood Pressure?  Patient reported checking blood pressures  Current home BP readings: 135/70 (05/16/22)    What recent interventions/DTPs have been made by any provider to improve Blood Pressure control since last CPP Visit:  Patient denied any recent medication changes since last visit with CPP    Any recent hospitalizations or ED visits since last visit with CPP? Patient has not had any hospitalizations or ED visits since last visit with CPP    What diet changes have been made to improve Blood Pressure Control?  Patient reported he does not add any salt to his food.    What exercise is being done to improve your Blood Pressure Control?   Patient reported he has been working on being active since his heart stents.   Adherence Review: Is the patient currently on ACE/ARB medication? No Does the patient have >5 day gap between last estimated fill dates? No    Care Gaps   AWV: done 04/29/21 Colonoscopy: done 03/17/17 DM Eye Exam: N/A DM Foot Exam:  N/A Microalbumin: N/A HbgAIC: one 12/31/21 (6.1) DEXA: N/A Mammogram: N/A     Star Rating Drugs: No star rating drugs noted   Future Appointments  Date  Time Provider [place]  06/22/2022  7:00 AM [facility] DEVICE REMOTES [facility]     Jobe Gibbon, [facility] Clinical Pharmacist Assistant  [phone]

## 2022-05-27 ENCOUNTER — Ambulatory Visit (INDEPENDENT_AMBULATORY_CARE_PROVIDER_SITE_OTHER): Payer: Medicare Other | Admitting: Family Medicine

## 2022-05-27 ENCOUNTER — Other Ambulatory Visit (INDEPENDENT_AMBULATORY_CARE_PROVIDER_SITE_OTHER): Payer: Medicare Other

## 2022-05-27 VITALS — BP 150/90 | HR 70 | Temp 97.8°F | Ht 70.0 in | Wt 199.0 lb

## 2022-05-27 DIAGNOSIS — I5022 Chronic systolic (congestive) heart failure: Secondary | ICD-10-CM | POA: Diagnosis not present

## 2022-05-27 DIAGNOSIS — Z23 Encounter for immunization: Secondary | ICD-10-CM

## 2022-05-27 DIAGNOSIS — I4819 Other persistent atrial fibrillation: Secondary | ICD-10-CM

## 2022-05-27 DIAGNOSIS — I10 Essential (primary) hypertension: Secondary | ICD-10-CM

## 2022-05-27 MED ORDER — LOSARTAN POTASSIUM 50 MG PO TABS: 50.0000 mg | ORAL_TABLET | Freq: Every day | ORAL | 3 refills | Status: DC

## 2022-05-27 MED ORDER — SPIRONOLACTONE 25 MG PO TABS
25.0000 mg | ORAL_TABLET | Freq: Every day | ORAL | 0 refills | Status: DC
Start: 2022-05-27 — End: 2023-05-30

## 2022-05-27 MED ORDER — SPIRONOLACTONE 25 MG PO TABS: 25.0000 mg | ORAL_TABLET | Freq: Every day | ORAL | 3 refills | Status: DC

## 2022-05-27 NOTE — Progress Notes (Signed)
Subjective:    Patient ID: Austin Mesi., male    DOB: 08/07/1931, 88 y.o.   MRN: [id number]  HPI  Patient had an echocardiogram in October which revealed an ejection fraction of 30 to 35%.  He has atrial fibrillation.  He is currently anticoagulated with Eliquis.  Recently saw cardiology who had to switch entresto to losartan due to dizziness. He has to take potassium due to lasix.  He is not on an SGLT-2.  He is on a beta blocker.  He denies any chest pain.  He denies any shortness of breath.  He denies any dyspnea on exertion.  He denies any orthopnea or paroxysmal nocturnal dyspnea.  There is no edema on exam today.  His blood pressure is elevated  Past Medical History:  Diagnosis Date   Anxiety    Atrial fibrillation (HCC)    Chronic   Cataracts, bilateral    to have left eye cataract surgery on 03/21/17    CHF (congestive heart failure), NYHA class II, chronic, diastolic (HCC)    Colon polyps    last colonoscopy 2015 (tubular adenoma)   Depression    GERD (gastroesophageal reflux disease)    H/O: GI bleed    Recurrent   Hemorrhoids    History of BPH    History of kidney stones    hx of x 1  passed stones   Hypertension    Pernicious anemia    Presence of permanent cardiac pacemaker    PVC (premature ventricular contraction)    S/P mitral valve clip implantation 03/25/2021   Successful transcatheter edge-to-edge mitral valve repair with placement of a MitraClip G4 XTW clip positioned A2/P2 and placement of a MitraClip G4 NTW clip positioned on the lateral side of A2/P2, reducing mitral regurgitation from 4+ at baseline to 1-2+ post procedure   Severe mitral regurgitation    Vitamin D deficiency    Past Surgical History:  Procedure Laterality Date   COLONOSCOPY WITH PROPOFOL N/A 03/17/2017   Procedure: COLONOSCOPY WITH PROPOFOL;  Surgeon: Carol Ada, MD;  [facility];  Service: Endoscopy;  Laterality: N/A;   [place]; ~ 2002   left; right   MITRAL VALVE REPAIR N/A 03/25/2021   Procedure: MITRAL VALVE REPAIR;  Surgeon: Sherren Mocha, MD;  Location: [place] [facility];  Service: Cardiovascular;  Laterality: N/A;   MULTIPLE EXTRACTIONS WITH ALVEOLOPLASTY N/A 03/18/2021   Procedure: MULTIPLE EXTRACTION WITH ALVEOLOPLASTY;  Surgeon: Charlaine Dalton, DMD;  Location: [place];  Service: Dentistry;  Laterality: N/A;   PACEMAKER IMPLANT N/A 03/11/2019   Procedure: PACEMAKER IMPLANT;  Surgeon: Constance Haw, MD;  Location: [place] [facility];  Service: Cardiovascular;  Laterality: N/A;   RIGHT/LEFT HEART CATH AND CORONARY ANGIOGRAPHY N/A 02/24/2021   Procedure: RIGHT/LEFT HEART CATH AND CORONARY ANGIOGRAPHY;  Surgeon: Sherren Mocha, MD;  Location: [place] [facility];  Service: Cardiovascular;  Laterality: N/A;   TEE WITHOUT CARDIOVERSION N/A 12/23/2020   Procedure: TRANSESOPHAGEAL ECHOCARDIOGRAM (TEE);  Surgeon: Freada Bergeron, MD;  Location: [facility];  Service: Cardiovascular;  Laterality: N/A;   TEE WITHOUT CARDIOVERSION N/A 03/25/2021   Procedure: TRANSESOPHAGEAL ECHOCARDIOGRAM (TEE);  Surgeon: Sherren Mocha, MD;  Location: [place] [facility];  Service: Cardiovascular;  Laterality: N/A;   TEMPORARY PACEMAKER N/A 03/10/2019   Procedure: TEMPORARY PACEMAKER;  Surgeon: Martinique, Peter M, MD;  Location: [place] [facility];  Service: Cardiovascular;  Laterality: N/A;  Current Outpatient Medications on File Prior to Visit  Medication Sig Dispense Refill   acetaminophen (TYLENOL) 500 MG tablet Take 500 mg by mouth every 6 (six) hours as needed for mild pain, fever or headache.     albuterol (VENTOLIN HFA) 108 (90 Base) MCG/ACT inhaler INHALE 2 PUFFS EVERY 6 HOURS AS NEEDED FOR WHEEZING OR SHORTNESS OF BREATH 18 g 3   amLODipine (NORVASC) 5 MG tablet Take 1 tablet (5 mg total) by mouth daily. 90 tablet 3   carvedilol (COREG) 12.5 MG tablet Take 1 tablet (12.5 mg total) by mouth 2 (two) times daily.  180 tablet 3   ferrous sulfate 325 (65 FE) MG tablet Take 325 mg by mouth every other day.      fluticasone (FLONASE) 50 MCG/ACT nasal spray USE 1 SPRAY IN EACH NOSTRIL EVERY DAY 32 g 1   furosemide (LASIX) 40 MG tablet Take 1 tablet (40 mg total) by mouth daily. 90 tablet 3   guaiFENesin-dextromethorphan (ROBITUSSIN DM) 100-10 MG/5ML syrup Take 5 mLs by mouth every 4 (four) hours as needed for cough.     losartan (COZAAR) 25 MG tablet Take 1 tablet (25 mg total) by mouth daily. 90 tablet 3   Multiple Vitamin (MULTIVITAMIN) tablet Take 1 tablet by mouth daily.     multivitamin-lutein (OCUVITE-LUTEIN) CAPS Take 1 capsule by mouth daily.     polyvinyl alcohol (LIQUIFILM TEARS) 1.4 % ophthalmic solution Place 1 drop into both eyes as needed for dry eyes.     potassium chloride (KLOR-CON M) 10 MEQ tablet Take 1 tablet (10 mEq total) by mouth daily. 90 tablet 3   rivaroxaban (XARELTO) 20 MG TABS tablet TAKE 1 TABLET EVERY DAY WITH SUPPER 90 tablet 3   vitamin B-12 (CYANOCOBALAMIN) 1000 MCG tablet TAKE 1 TABLET EVERY DAY 90 tablet 3   No current facility-administered medications on file prior to visit.   No Known Allergies Social History   Socioeconomic History   Marital status: Widowed    Spouse name: Not on file   Number of children: 1   Years of education: Not on file   Highest education level: Not on file  Occupational History   Occupation: Retired  Tobacco Use   Smoking status: Never   Smokeless tobacco: Former    Types: Snuff   Tobacco comments:    03/17/21 "still use snuff daily"  Vaping Use   Vaping Use: Never used  Substance and Sexual Activity   Alcohol use: No   Drug use: No   Sexual activity: Never    Comment: widower, lives with granddaughter and great grandchildren.  Other Topics Concern   Not on file  Social History Narrative   Lives with grand daughter.         Epworth Sleepiness Scale = 7 (as of 09/21/2015)   Social Determinants of Health   Financial Resource  Strain: Low Risk  (04/29/2021)   Overall Financial Resource Strain (CARDIA)    Difficulty of Paying Living Expenses: Not hard at all  Food Insecurity: No Food Insecurity (04/29/2021)   Hunger Vital Sign    Worried About Running Out of Food in the Last Year: Never true    Ran Out of Food in the Last Year: Never true  Transportation Needs: No Transportation Needs (04/29/2021)   PRAPARE - Hydrologist (Medical): No    Lack of Transportation (Non-Medical): No  Physical Activity: Sufficiently Active (04/29/2021)   Exercise Vital Sign    Days  of Exercise per Week: 5 days    Minutes of Exercise per Session: 30 min  Stress: No Stress Concern Present (04/29/2021)   [place]    Feeling of Stress : Not at all  Social Connections: Socially Isolated (04/29/2021)   Social Connection and Isolation Panel [NHANES]    Frequency of Communication with Friends and Family: More than three times a week    Frequency of Social Gatherings with Friends and Family: More than three times a week    Attends Religious Services: Never    Marine scientist or Organizations: No    Attends Archivist Meetings: Never    Marital Status: Widowed  Intimate Partner Violence: Not At Risk (04/29/2021)   Humiliation, Afraid, Rape, and Kick questionnaire    Fear of Current or Ex-Partner: No    Emotionally Abused: No    Physically Abused: No    Sexually Abused: No      Review of Systems  All other systems reviewed and are negative.      Objective:   Physical Exam Vitals reviewed.  Constitutional:      Appearance: Normal appearance.  Cardiovascular:     Rate and Rhythm: Normal rate. Rhythm irregular.     Heart sounds: Murmur heard.  Pulmonary:     Effort: Pulmonary effort is normal. No respiratory distress.     Breath sounds: Normal breath sounds. No stridor.  Musculoskeletal:     Right lower leg: No  edema.     Left lower leg: No edema.  Neurological:     Mental Status: He is alert.           Assessment & Plan:   Atrial fibrillation, persistent (HCC)  Essential hypertension  Chronic systolic congestive heart failure ([place]) - Plan: CBC with Differential/Platelet, BASIC METABOLIC PANEL WITH GFR I have asked him to increase losartan to 50 mg a day due to his elevated blood pressure.  I am asking him to stop potassium.  I will replace it with spironolactone 25 mg daily to try to maximize his therapy for congestive heart failure with a reduced EF.  Check a baseline BMP today.  Patient received his flu shot, Prevnar 20, and I recommended a COVID shot.  He is appropriately anticoagulated and I will monitor CBC to rule out any anemia.  Monitor renal function and potassium in 2 weeks on spironolactone

## 2022-05-28 LAB — CBC WITH DIFFERENTIAL/PLATELET
Absolute Monocytes: 498 cells/uL (ref 200–950)
Basophils Absolute: 50 cells/uL (ref 0–200)
Basophils Relative: 0.8 %
Eosinophils Absolute: 82 cells/uL (ref 15–500)
Eosinophils Relative: 1.3 %
HCT: 43.6 % (ref 38.5–50.0)
Hemoglobin: 14.7 g/dL (ref 13.2–17.1)
Lymphs Abs: 1380 cells/uL (ref 850–3900)
MCH: 29.8 pg (ref 27.0–33.0)
MCHC: 33.7 g/dL (ref 32.0–36.0)
MCV: 88.3 fL (ref 80.0–100.0)
MPV: 11.8 fL (ref 7.5–12.5)
Monocytes Relative: 7.9 %
Neutro Abs: 4290 cells/uL (ref 1500–7800)
Neutrophils Relative %: 68.1 %
Platelets: 130 10*3/uL — ABNORMAL LOW (ref 140–400)
RBC: 4.94 10*6/uL (ref 4.20–5.80)
RDW: 13.6 % (ref 11.0–15.0)
Total Lymphocyte: 21.9 %
WBC: 6.3 10*3/uL (ref 3.8–10.8)

## 2022-05-28 LAB — BASIC METABOLIC PANEL WITH GFR
BUN: 17 mg/dL (ref 7–25)
CO2: 30 mmol/L (ref 20–32)
Calcium: 9.7 mg/dL (ref 8.6–10.3)
Chloride: 101 mmol/L (ref 98–110)
Creat: 1.01 mg/dL (ref 0.70–1.22)
Glucose, Bld: 120 mg/dL — ABNORMAL HIGH (ref 65–99)
Potassium: 4 mmol/L (ref 3.5–5.3)
Sodium: 138 mmol/L (ref 135–146)
eGFR: 72 mL/min/{1.73_m2} (ref >=60)

## 2022-06-22 ENCOUNTER — Ambulatory Visit (INDEPENDENT_AMBULATORY_CARE_PROVIDER_SITE_OTHER): Payer: Medicare HMO

## 2022-06-22 DIAGNOSIS — I442 Atrioventricular block, complete: Secondary | ICD-10-CM

## 2022-06-22 LAB — CUP PACEART REMOTE DEVICE CHECK
Battery Remaining Longevity: 94 mo
Battery Remaining Percentage: 75 %
Battery Voltage: 3.01 V
Brady Statistic RV Percent Paced: 96 %
Date Time Interrogation Session: 20231206092719
Implantable Lead Connection Status: 753985
Implantable Lead Implant Date: 20200824
Implantable Lead Location: 753860
Implantable Pulse Generator Implant Date: 20200824
Lead Channel Impedance Value: 440 Ohm
Lead Channel Pacing Threshold Amplitude: 1 V
Lead Channel Pacing Threshold Pulse Width: 0.5 ms
Lead Channel Sensing Intrinsic Amplitude: 9.3 mV
Lead Channel Setting Pacing Amplitude: 1.25 V
Lead Channel Setting Pacing Pulse Width: 0.5 ms
Lead Channel Setting Sensing Sensitivity: 0.7 mV
Pulse Gen Model: 1272
Pulse Gen Serial Number: 9140133

## 2022-07-04 ENCOUNTER — Other Ambulatory Visit: Payer: Self-pay

## 2022-07-04 ENCOUNTER — Telehealth: Payer: Self-pay

## 2022-07-04 MED ORDER — RIVAROXABAN 20 MG PO TABS: ORAL_TABLET | ORAL | 1 refills | Status: DC

## 2022-07-04 NOTE — Telephone Encounter (Signed)
Pt called in having to r/s his cpe to next month (08/05/22). Pt will be out of this med rivaroxaban (XARELTO) 20 MG TABS tablet [[id number]] after today. Pt would like to know if he could get a refill sent to pharmacy please. Please advise.  CB#: [phone]  [facility] [address]

## 2022-07-05 ENCOUNTER — Encounter: Payer: Medicare Other | Admitting: Family Medicine

## 2022-07-14 ENCOUNTER — Ambulatory Visit (INDEPENDENT_AMBULATORY_CARE_PROVIDER_SITE_OTHER): Payer: Medicare Other

## 2022-07-14 ENCOUNTER — Other Ambulatory Visit: Payer: Self-pay

## 2022-07-14 VITALS — Ht 70.0 in | Wt 199.0 lb

## 2022-07-14 DIAGNOSIS — Z0000 Encounter for general adult medical examination without abnormal findings: Secondary | ICD-10-CM | POA: Diagnosis not present

## 2022-07-14 MED ORDER — RIVAROXABAN 20 MG PO TABS: ORAL_TABLET | ORAL | 0 refills | Status: DC

## 2022-07-14 NOTE — Patient Instructions (Signed)
Austin Gray , Thank you for taking time to come for your Medicare Wellness Visit. I appreciate your ongoing commitment to your health goals. Please review the following plan we discussed and let me know if I can assist you in the future.   These are the goals we discussed:  Goals      DIET - Austin Gray:     CARE PLAN ENTRY (see longitudinal plan of care for additional care plan information)  Current Barriers:  Chronic Disease Management support, education, and care coordination needs related to Hypertension, Hyperlipidemia, Atrial Fibrillation, and Heart Failure   Hypertension BP Readings from Last 3 Encounters:  03/03/20 140/74  12/19/19 130/80  10/28/19 (!) 156/90  Pharmacist Clinical Goal(s): Over the next 180 days, patient will work with PharmD and providers to maintain BP goal <130/80 Current regimen:  Amlodipine '10mg'$  Carvedilol 12.'5mg'$  bid Losartan '50mg'$  Interventions: Reviewed home blood pressure monitoring Comprehensive medication review Patient self care activities - Over the next 180 days, patient will: Check BP daily, document, and provide at future appointments Ensure daily salt intake < 2300 mg/day Contact providers with consistent readings > 130/80.  Hyperlipidemia Lab Results  Component Value Date/Time   [facility] 101 04/08/2016 08:23 AM  Pharmacist Clinical Goal(s): Over the next 180 days, patient will work with PharmD and providers to achieve LDL goal < 100 Current regimen:  None Interventions: Reviewed most recent lipid panel Recommended updated lipid panel Patient self care activities - Over the next 180 days, patient will: Report to PCP for updated lab work Work to continue dietary modifications to bring LDL to goal.  HF Pharmacist Clinical Goal(s) Over the next 180 days, patient will work with PharmD and providers to optimize medication related to HF. Current regimen:  Furosemide '40mg'$  Carvedilol 12.'5mg'$  twice  daily Interventions: Discussed current fluid status Counseled on timing of furosemide dosing. Patient self care activities - Over the next 180 days, patient will: Continue to monitor for excessive fluid overload Contact providers if you gain more than 3 lbs. In one day on 5 lbs in one week. Afib Pharmacist Clinical Goal(s) Over the next 180 days, patient will work with PharmD and providers to optimize medication and minimize symptoms of Afib. Current regimen:  Xarelto '20mg'$  daily Carvedilol 12.'5mg'$  twice daily Interventions: Counseled on signs of abnormal bruising and bleeding Patient self care activities - Over the next 180 days, patient will: Continue to take medication as directed Contact providers with any signs of abnormal bleeding  Initial goal documentation         This is a list of the screening recommended for you and due dates:  Health Maintenance  Topic Date Due   Zoster (Shingles) Vaccine (1 of 2) Never done   COVID-19 Vaccine (4 - 2023-24 season) 03/18/2022   Medicare Annual Wellness Visit  07/15/2023   DTaP/Tdap/Td vaccine (3 - Td or Tdap) 06/28/2025   Pneumonia Vaccine  Completed   Flu Shot  Completed   HPV Vaccine  Aged Out    Advanced directives: Please bring a copy of your health care power of attorney and living will to the office to be added to your chart at your convenience.   Conditions/risks identified: Aim for 30 minutes of exercise or brisk walking, 6-8 glasses of water, and 5 servings of fruits and vegetables each day.   Next appointment: Follow up in one year for your annual wellness visit.   Preventive Care 65 Years and Older,  Male  Preventive care refers to lifestyle choices and visits with your health care provider that can promote health and wellness. What does preventive care include? A yearly physical exam. This is also called an annual well check. Dental exams once or twice a year. Routine eye exams. Ask your health care provider how  often you should have your eyes checked. Personal lifestyle choices, including: Daily care of your teeth and gums. Regular physical activity. Eating a healthy diet. Avoiding tobacco and drug use. Limiting alcohol use. Practicing safe sex. Taking low doses of aspirin every day. Taking vitamin and mineral supplements as recommended by your health care provider. What happens during an annual well check? The services and screenings done by your health care provider during your annual well check will depend on your age, overall health, lifestyle risk factors, and family history of disease. Counseling  Your health care provider may ask you questions about your: Alcohol use. Tobacco use. Drug use. Emotional well-being. Home and relationship well-being. Sexual activity. Eating habits. History of falls. Memory and ability to understand (cognition). Work and work Statistician. Screening  You may have the following tests or measurements: Height, weight, and BMI. Blood pressure. Lipid and cholesterol levels. These may be checked every 5 years, or more frequently if you are over 62 years old. Skin check. Lung cancer screening. You may have this screening every year starting at age 22 if you have a 30-pack-year history of smoking and currently smoke or have quit within the past 15 years. Fecal occult blood test (FOBT) of the stool. You may have this test every year starting at age 49. Flexible sigmoidoscopy or colonoscopy. You may have a sigmoidoscopy every 5 years or a colonoscopy every 10 years starting at age 60. Prostate cancer screening. Recommendations will vary depending on your family history and other risks. Hepatitis C blood test. Hepatitis B blood test. Sexually transmitted disease (STD) testing. Diabetes screening. This is done by checking your blood sugar (glucose) after you have not eaten for a while (fasting). You may have this done every 1-3 years. Abdominal aortic aneurysm  (AAA) screening. You may need this if you are a current or former smoker. Osteoporosis. You may be screened starting at age 44 if you are at high risk. Talk with your health care provider about your test results, treatment options, and if necessary, the need for more tests. Vaccines  Your health care provider may recommend certain vaccines, such as: Influenza vaccine. This is recommended every year. Tetanus, diphtheria, and acellular pertussis (Tdap, Td) vaccine. You may need a Td booster every 10 years. Zoster vaccine. You may need this after age 47. Pneumococcal 13-valent conjugate (PCV13) vaccine. One dose is recommended after age 59. Pneumococcal polysaccharide (PPSV23) vaccine. One dose is recommended after age 46. Talk to your health care provider about which screenings and vaccines you need and how often you need them. This information is not intended to replace advice given to you by your health care provider. Make sure you discuss any questions you have with your health care provider. Document Released: 07/31/2015 Document Revised: 03/23/2016 Document Reviewed: 05/05/2015 Elsevier Interactive Patient Education  2017 [place] Prevention in the Home Falls can cause injuries. They can happen to people of all ages. There are many things you can do to make your home safe and to help prevent falls. What can I do on the outside of my home? Regularly fix the edges of walkways and driveways and fix any cracks. Remove  anything that might make you trip as you walk through a door, such as a raised step or threshold. Trim any bushes or trees on the path to your home. Use bright outdoor lighting. Clear any walking paths of anything that might make someone trip, such as rocks or tools. Regularly check to see if handrails are loose or broken. Make sure that both sides of any steps have handrails. Any raised decks and porches should have guardrails on the edges. Have any leaves, snow, or  ice cleared regularly. Use sand or salt on walking paths during winter. Clean up any spills in your garage right away. This includes oil or grease spills. What can I do in the bathroom? Use night lights. Install grab bars by the toilet and in the tub and shower. Do not use towel bars as grab bars. Use non-skid mats or decals in the tub or shower. If you need to sit down in the shower, use a plastic, non-slip stool. Keep the floor dry. Clean up any water that spills on the floor as soon as it happens. Remove soap buildup in the tub or shower regularly. Attach bath mats securely with double-sided non-slip rug tape. Do not have throw rugs and other things on the floor that can make you trip. What can I do in the bedroom? Use night lights. Make sure that you have a light by your bed that is easy to reach. Do not use any sheets or blankets that are too big for your bed. They should not hang down onto the floor. Have a firm chair that has side arms. You can use this for support while you get dressed. Do not have throw rugs and other things on the floor that can make you trip. What can I do in the kitchen? Clean up any spills right away. Avoid walking on wet floors. Keep items that you use a lot in easy-to-reach places. If you need to reach something above you, use a strong step stool that has a grab bar. Keep electrical cords out of the way. Do not use floor polish or wax that makes floors slippery. If you must use wax, use non-skid floor wax. Do not have throw rugs and other things on the floor that can make you trip. What can I do with my stairs? Do not leave any items on the stairs. Make sure that there are handrails on both sides of the stairs and use them. Fix handrails that are broken or loose. Make sure that handrails are as long as the stairways. Check any carpeting to make sure that it is firmly attached to the stairs. Fix any carpet that is loose or worn. Avoid having throw rugs at  the top or bottom of the stairs. If you do have throw rugs, attach them to the floor with carpet tape. Make sure that you have a light switch at the top of the stairs and the bottom of the stairs. If you do not have them, ask someone to add them for you. What else can I do to help prevent falls? Wear shoes that: Do not have high heels. Have rubber bottoms. Are comfortable and fit you well. Are closed at the toe. Do not wear sandals. If you use a stepladder: Make sure that it is fully opened. Do not climb a closed stepladder. Make sure that both sides of the stepladder are locked into place. Ask someone to hold it for you, if possible. Clearly mark and make sure that you  can see: Any grab bars or handrails. First and last steps. Where the edge of each step is. Use tools that help you move around (mobility aids) if they are needed. These include: Canes. Walkers. Scooters. Crutches. Turn on the lights when you go into a dark area. Replace any light bulbs as soon as they burn out. Set up your furniture so you have a clear path. Avoid moving your furniture around. If any of your floors are uneven, fix them. If there are any pets around you, be aware of where they are. Review your medicines with your doctor. Some medicines can make you feel dizzy. This can increase your chance of falling. Ask your doctor what other things that you can do to help prevent falls. This information is not intended to replace advice given to you by your health care provider. Make sure you discuss any questions you have with your health care provider. Document Released: 04/30/2009 Document Revised: 12/10/2015 Document Reviewed: 08/08/2014 Elsevier Interactive Patient Education  2017 [employer].

## 2022-07-14 NOTE — Progress Notes (Signed)
Subjective:   Austin Gray. is a 88 y.o. male who presents for Medicare Annual/Subsequent preventive examination.  I connected with  Austin Gray. on 07/14/22 by a audio enabled telemedicine application and verified that I am speaking with the correct person using two identifiers.  Patient Location: Home  Provider Location: Office/Clinic  I discussed the limitations of evaluation and management by telemedicine. The patient expressed understanding and agreed to proceed.  Review of Systems     Cardiac Risk Factors include: advanced age (>42mn, >>79women);dyslipidemia;male gender;sedentary lifestyle     Objective:    Today's Vitals   07/14/22 1036  Weight: 199 lb (90.3 kg)  Height: '5\' 10"'$  (1.778 m)   Body mass index is 28.55 kg/m.     07/14/2022   11:13 AM 04/29/2021    2:17 PM 03/26/2021    9:00 AM 03/23/2021    1:31 PM 03/18/2021    7:46 AM 02/24/2021    9:13 AM 12/23/2020   12:01 PM  Advanced Directives  Does Patient Have a Medical Advance Directive? Yes No No No No No No  Type of Advance Directive Living will;Healthcare Power of Attorney        Does patient want to make changes to medical advance directive? No - Patient declined        [place] of [place]? No - copy requested        Would patient like information on creating a medical advance directive?  No - Patient declined No - Patient declined No - Patient declined No - Patient declined No - Patient declined No - Patient declined    Current Medications (verified) Outpatient Encounter Medications as of 07/14/2022  Medication Sig   acetaminophen (TYLENOL) 500 MG tablet Take 500 mg by mouth every 6 (six) hours as needed for mild pain, fever or headache.   albuterol (VENTOLIN HFA) 108 (90 Base) MCG/ACT inhaler INHALE 2 PUFFS EVERY 6 HOURS AS NEEDED FOR WHEEZING OR SHORTNESS OF BREATH   amLODipine (NORVASC) 5 MG tablet Take 1 tablet (5 mg total) by mouth daily.   carvedilol  (COREG) 12.5 MG tablet Take 1 tablet (12.5 mg total) by mouth 2 (two) times daily.   ferrous sulfate 325 (65 FE) MG tablet Take 325 mg by mouth every other day.    fluticasone (FLONASE) 50 MCG/ACT nasal spray USE 1 SPRAY IN EACH NOSTRIL EVERY DAY   furosemide (LASIX) 40 MG tablet Take 1 tablet (40 mg total) by mouth daily.   guaiFENesin-dextromethorphan (ROBITUSSIN DM) 100-10 MG/5ML syrup Take 5 mLs by mouth every 4 (four) hours as needed for cough.   losartan (COZAAR) 50 MG tablet Take 1 tablet (50 mg total) by mouth daily.   Multiple Vitamin (MULTIVITAMIN) tablet Take 1 tablet by mouth daily.   multivitamin-lutein (OCUVITE-LUTEIN) CAPS Take 1 capsule by mouth daily.   polyvinyl alcohol (LIQUIFILM TEARS) 1.4 % ophthalmic solution Place 1 drop into both eyes as needed for dry eyes.   potassium chloride (KLOR-CON M) 10 MEQ tablet Take 10 mEq by mouth daily.   rivaroxaban (XARELTO) 20 MG TABS tablet TAKE 1 TABLET EVERY DAY WITH SUPPER   spironolactone (ALDACTONE) 25 MG tablet Take 1 tablet (25 mg total) by mouth daily.   vitamin B-12 (CYANOCOBALAMIN) 1000 MCG tablet TAKE 1 TABLET EVERY DAY   No facility-administered encounter medications on file as of 07/14/2022.    Allergies (verified) Patient has no known allergies.   History: Past Medical History:  Diagnosis Date   Anxiety    Atrial fibrillation (HCC)    Chronic   Cataracts, bilateral    to have left eye cataract surgery on 03/21/17    CHF (congestive heart failure), NYHA class II, chronic, diastolic (HCC)    Colon polyps    last colonoscopy 2015 (tubular adenoma)   Depression    GERD (gastroesophageal reflux disease)    H/O: GI bleed    Recurrent   Hemorrhoids    History of BPH    History of kidney stones    hx of x 1  passed stones   Hypertension    Pernicious anemia    Presence of permanent cardiac pacemaker    PVC (premature ventricular contraction)    S/P mitral valve clip implantation 03/25/2021   Successful  transcatheter edge-to-edge mitral valve repair with placement of a MitraClip G4 XTW clip positioned A2/P2 and placement of a MitraClip G4 NTW clip positioned on the lateral side of A2/P2, reducing mitral regurgitation from 4+ at baseline to 1-2+ post procedure   Severe mitral regurgitation    Vitamin D deficiency    Past Surgical History:  Procedure Laterality Date   COLONOSCOPY WITH PROPOFOL N/A 03/17/2017   Procedure: COLONOSCOPY WITH PROPOFOL;  Surgeon: Carol Ada, MD;  Location: [facility];  Service: Endoscopy;  Laterality: N/A;   [place]; ~ 2002   left; right   MITRAL VALVE REPAIR N/A 03/25/2021   Procedure: MITRAL VALVE REPAIR;  Surgeon: Sherren Mocha, MD;  Location: [place] [facility];  Service: Cardiovascular;  Laterality: N/A;   MULTIPLE EXTRACTIONS WITH ALVEOLOPLASTY N/A 03/18/2021   Procedure: MULTIPLE EXTRACTION WITH ALVEOLOPLASTY;  Surgeon: Charlaine Dalton, DMD;  Location: [place];  Service: Dentistry;  Laterality: N/A;   PACEMAKER IMPLANT N/A 03/11/2019   Procedure: PACEMAKER IMPLANT;  Surgeon: Constance Haw, MD;  Location: [place] [facility];  Service: Cardiovascular;  Laterality: N/A;   RIGHT/LEFT HEART CATH AND CORONARY ANGIOGRAPHY N/A 02/24/2021   Procedure: RIGHT/LEFT HEART CATH AND CORONARY ANGIOGRAPHY;  Surgeon: Sherren Mocha, MD;  Location: [facility] [facility];  Service: Cardiovascular;  Laterality: N/A;   TEE WITHOUT CARDIOVERSION N/A 12/23/2020   Procedure: TRANSESOPHAGEAL ECHOCARDIOGRAM (TEE);  Surgeon: Freada Bergeron, MD;  Location: [facility];  Service: Cardiovascular;  Laterality: N/A;   TEE WITHOUT CARDIOVERSION N/A 03/25/2021   Procedure: TRANSESOPHAGEAL ECHOCARDIOGRAM (TEE);  Surgeon: Sherren Mocha, MD;  Location: [place] [facility];  Service: Cardiovascular;  Laterality: N/A;   TEMPORARY PACEMAKER N/A 03/10/2019   Procedure: TEMPORARY PACEMAKER;  Surgeon: Martinique, Peter M, MD;  Location: [place] [facility];  Service: Cardiovascular;  Laterality: N/A;   Family History  Problem Relation Age of Onset   Stroke Father 11   Cancer Mother    Social History   Socioeconomic History   Marital status: Widowed    Spouse name: Not on file   Number of children: 1   Years of education: Not on file   Highest education level: Not on file  Occupational History   Occupation: Retired  Tobacco Use   Smoking status: Never   Smokeless tobacco: Former    Types: Snuff   Tobacco comments:    03/17/21 "still use snuff daily"  Vaping Use   Vaping Use: Never used  Substance and Sexual Activity   Alcohol use: No   Drug use: No   Sexual activity: Never    Comment: widower, lives with granddaughter and  great grandchildren.  Other Topics Concern   Not on file  Social History Narrative   Lives with grand daughter.         Epworth Sleepiness Scale = 7 (as of 09/21/2015)   Social Determinants of Health   Financial Resource Strain: Low Risk  (07/14/2022)   Overall Financial Resource Strain (CARDIA)    Difficulty of Paying Living Expenses: Not hard at all  Food Insecurity: No Food Insecurity (07/14/2022)   Hunger Vital Sign    Worried About Running Out of Food in the Last Year: Never true    Ran Out of Food in the Last Year: Never true  Transportation Needs: No Transportation Needs (07/14/2022)   PRAPARE - Hydrologist (Medical): No    Lack of Transportation (Non-Medical): No  Physical Activity: Insufficiently Active (07/14/2022)   Exercise Vital Sign    Days of Exercise per Week: 3 days    Minutes of Exercise per Session: 30 min  Stress: No Stress Concern Present (07/14/2022)   [place]    Feeling of Stress : Not at all  Social Connections: Moderately Isolated (07/14/2022)   Social Connection and Isolation Panel [NHANES]    Frequency of Communication with Friends and Family: More than three times a  week    Frequency of Social Gatherings with Friends and Family: Three times a week    Attends Religious Services: More than 4 times per year    Active Member of Clubs or Organizations: No    Attends Archivist Meetings: Never    Marital Status: Widowed    Tobacco Counseling Counseling given: Not Answered Tobacco comments: 03/17/21 "still use snuff daily"   Clinical Intake:  Pre-visit preparation completed: Yes  Pain : No/denies pain  Diabetes: No  How often do you need to have someone help you when you read instructions, pamphlets, or other written materials from your doctor or pharmacy?: 1 - Never  Diabetic?No   Interpreter Needed?: No  Information entered by :: Denman George LPN   Activities of Daily Living    07/14/2022   11:14 AM  In your present state of health, do you have any difficulty performing the following activities:  Hearing? 0  Vision? 0  Difficulty concentrating or making decisions? 0  Walking or climbing stairs? 0  Dressing or bathing? 0  Doing errands, shopping? 0  Preparing Food and eating ? N  Using the Toilet? N  In the past six months, have you accidently leaked urine? N  Do you have problems with loss of bowel control? N  Managing your Medications? N  Managing your Finances? N  Housekeeping or managing your Housekeeping? N    Patient Care Team: [name], MD as PCP - General (Family Medicine) Constance Haw, MD as PCP - Electrophysiology (Cardiology) Minus Breeding, MD as PCP - Cardiology (Cardiology) Edythe Clarity, [facility] as Pharmacist (Pharmacist) Lenna Sciara, NP as Nurse Practitioner (Cardiology)  Indicate any recent Medical Services you may have received from other than [facility] providers in the past year (date may be approximate).     Assessment:   This is a routine wellness examination for Austin Gray.  Hearing/Vision screen Hearing Screening - Comments:: Denies hearing difficulties   Vision Screening  - Comments:: up to date with routine eye exams   Dietary issues and exercise activities discussed: Current Exercise Habits: The patient does not participate in regular exercise at present  Goals Addressed             This Visit's Progress    DIET - INCREASE WATER INTAKE   On track     Depression Screen    07/14/2022   10:39 AM 04/29/2021    2:11 PM 04/05/2021   11:49 AM 12/08/2020    8:05 AM 08/10/2018    8:03 AM 10/26/2017   11:04 AM 03/27/2014   10:28 AM  PHQ 2/9 Scores  PHQ - 2 Score 0 0 0 3 0 0 0  PHQ- 9 Score    10       Fall Risk    07/14/2022   10:37 AM 04/29/2021    2:17 PM 04/05/2021   11:49 AM 12/08/2020    8:05 AM 08/10/2018    8:03 AM  Fall Risk   Falls in the past year? 0 0 0 1 0  Number falls in past yr: 0 0 0 1   Injury with Fall? 0 0 0 0   Risk for fall due to : No Fall Risks Impaired balance/gait;Impaired vision  History of fall(s);Impaired balance/gait;Impaired mobility;Impaired vision   Follow up Falls evaluation completed;Education provided;Falls prevention discussed Falls prevention discussed Falls evaluation completed Falls evaluation completed Falls evaluation completed    FALL RISK PREVENTION PERTAINING TO THE HOME:  Any stairs in or around the home? No  If so, are there any without handrails? No  Home free of loose throw rugs in walkways, pet beds, electrical cords, etc? Yes  Adequate lighting in your home to reduce risk of falls? Yes   ASSISTIVE DEVICES UTILIZED TO PREVENT FALLS:  Life alert? No  Use of a cane, walker or w/c? No  Grab bars in the bathroom? Yes  Shower chair or bench in shower? No  Elevated toilet seat or a handicapped toilet? Yes   TIMED UP AND GO:  Was the test performed? No .   Cognitive Function:        07/14/2022   11:42 AM 04/29/2021    2:22 PM  6CIT Screen  What Year? 0 points 0 points  What month? 0 points 0 points  What time? 0 points 0 points  Count back from 20 0 points 0 points  Months in  reverse 0 points 0 points  Repeat phrase 2 points 2 points  Total Score 2 points 2 points    Immunizations Immunization History  Administered Date(s) Administered   Fluad Quad(high Dose 65+) 03/22/2019, 04/30/2020, 04/05/2021, 05/27/2022   Influenza Split 05/09/2012   Influenza Whole 04/17/2009   Influenza, High Dose Seasonal PF 04/24/2018   Influenza,inj,Quad PF,6+ Mos 03/22/2013, 03/27/2014, 04/06/2015, 04/07/2016, 03/31/2017   PFIZER(Purple Top)SARS-COV-2 Vaccination 10/18/2019, 11/09/2019, 07/01/2020   PNEUMOCOCCAL CONJUGATE-20 05/27/2022   Pneumococcal Conjugate-13 12/09/2014   Pneumococcal Polysaccharide-23 04/17/2000   Td 06/17/2002   Tdap 06/29/2015    TDAP status: Up to date  Flu Vaccine status: Up to date  Pneumococcal vaccine status: Up to date  Covid-19 vaccine status: Information provided on how to obtain vaccines.   Qualifies for Shingles Vaccine? Yes   Zostavax completed No   Shingrix Completed?: No.    Education has been provided regarding the importance of this vaccine. Patient has been advised to call insurance company to determine out of pocket expense if they have not yet received this vaccine. Advised may also receive vaccine at local pharmacy or Health Dept. Verbalized acceptance and understanding.  Screening Tests Health Maintenance  Topic Date Due   Zoster Vaccines- Shingrix (  1 of 2) Never done   COVID-19 Vaccine (4 - 2023-24 season) 03/18/2022   Medicare Annual Wellness (AWV)  07/15/2023   DTaP/Tdap/Td (3 - Td or Tdap) 06/28/2025   Pneumonia Vaccine 17+ Years old  Completed   INFLUENZA VACCINE  Completed   HPV VACCINES  Aged Out    Health Maintenance  Health Maintenance Due  Topic Date Due   Zoster Vaccines- Shingrix (1 of 2) Never done   COVID-19 Vaccine (4 - 2023-24 season) 03/18/2022    Colorectal cancer screening: No longer required.   Lung Cancer Screening: (Low Dose CT Chest recommended if Age 29-80 years, 30 pack-year currently  smoking OR have quit w/in 15years.) does not qualify.   Lung Cancer Screening Referral: n/a   Additional Screening:  Hepatitis C Screening: does not qualify  Vision Screening: Recommended annual ophthalmology exams for early detection of glaucoma and other disorders of the eye. Is the patient up to date with their annual eye exam?  No  Who is the provider or what is the name of the office in which the patient attends annual eye exams? Plans to re-establish in the upcoming year  If pt is not established with a provider, would they like to be referred to a provider to establish care? No .   Dental Screening: Recommended annual dental exams for proper oral hygiene  Community Resource Referral / Chronic Care Management: CRR required this visit?  No   CCM required this visit?  No      Plan:     I have personally reviewed and noted the following in the patient's chart:   Medical and social history Use of alcohol, tobacco or illicit drugs  Current medications and supplements including opioid prescriptions. Patient is not currently taking opioid prescriptions. Functional ability and status Nutritional status Physical activity Advanced directives List of other physicians Hospitalizations, surgeries, and ER visits in previous 12 months Vitals Screenings to include cognitive, depression, and falls Referrals and appointments  In addition, I have reviewed and discussed with patient certain preventive protocols, quality metrics, and best practice recommendations. A written personalized care plan for preventive services as well as general preventive health recommendations were provided to patient.     Vanetta Mulders, [place]   [id number]   Due to this being a virtual visit, the after visit summary with patients personalized plan was offered to patient via mail or my-chart. per request, patient was mailed a copy of AVS.  Nurse Notes: Patient is requesting a 90 day refill of  Xarelto to mail order pharmacy.  Will route as a refill request.

## 2022-07-14 NOTE — Telephone Encounter (Signed)
Patient seen for Annual Wellness Visit and is requesting a 90 day refill of Xarelto to be sent to mail order pharmacy

## 2022-07-15 NOTE — Progress Notes (Signed)
Remote pacemaker transmission.   

## 2022-08-05 ENCOUNTER — Encounter: Payer: 59 | Admitting: Family Medicine

## 2022-08-23 ENCOUNTER — Other Ambulatory Visit: Payer: Self-pay | Admitting: Family Medicine

## 2022-08-25 ENCOUNTER — Other Ambulatory Visit: Payer: Self-pay

## 2022-08-25 ENCOUNTER — Telehealth: Payer: Self-pay

## 2022-08-25 DIAGNOSIS — I10 Essential (primary) hypertension: Secondary | ICD-10-CM

## 2022-08-25 DIAGNOSIS — I4819 Other persistent atrial fibrillation: Secondary | ICD-10-CM

## 2022-08-25 MED ORDER — AMLODIPINE BESYLATE 5 MG PO TABS: 5.0000 mg | ORAL_TABLET | Freq: Every day | ORAL | 0 refills | Status: DC

## 2022-08-25 NOTE — Telephone Encounter (Signed)
Pt called in stating that he is out of this med amLODipine (NORVASC) 5 MG tablet [[id number]]. Pt states that it would take too long to refill with the mail in pharmacy that he uses. Pt [facility] [address]. Please advise.  LOV: 07/05/22 (UPCOMING CPE ON 09/16/22)  PHARAMCY: [facility] [place]: [phone]

## 2022-09-16 ENCOUNTER — Ambulatory Visit (INDEPENDENT_AMBULATORY_CARE_PROVIDER_SITE_OTHER): Payer: 59 | Admitting: Family Medicine

## 2022-09-16 ENCOUNTER — Encounter: Payer: Self-pay | Admitting: Family Medicine

## 2022-09-16 VITALS — BP 122/58 | HR 67 | Temp 97.7°F | Ht 70.0 in | Wt 192.0 lb

## 2022-09-16 DIAGNOSIS — I4819 Other persistent atrial fibrillation: Secondary | ICD-10-CM | POA: Diagnosis not present

## 2022-09-16 DIAGNOSIS — I10 Essential (primary) hypertension: Secondary | ICD-10-CM

## 2022-09-16 DIAGNOSIS — I5022 Chronic systolic (congestive) heart failure: Secondary | ICD-10-CM

## 2022-09-16 DIAGNOSIS — Z0001 Encounter for general adult medical examination with abnormal findings: Secondary | ICD-10-CM | POA: Diagnosis not present

## 2022-09-16 DIAGNOSIS — Z9889 Other specified postprocedural states: Secondary | ICD-10-CM | POA: Diagnosis not present

## 2022-09-16 DIAGNOSIS — Z0000 Encounter for general adult medical examination without abnormal findings: Secondary | ICD-10-CM

## 2022-09-16 NOTE — Progress Notes (Signed)
Subjective:    Patient ID: Austin Gray., male    DOB: [birth year], 89 y.o.   MRN: [id number]  HPI  Patient had an echocardiogram in October which revealed an ejection fraction of 30 to 35%.  He has atrial fibrillation.  He is here today for a physical exam.  He is still living independently.  He is here today with his neighbor who watches out for him.  He is still cooking and cleaning his own home and taking care of himself.  He denies any falls.  He denies any memory loss.  He denies any chest pain or shortness of breath.  He has not been taking his potassium although he is consistent with his losartan, Lasix and spironolactone.  Due to his age not looking colon cancer screening or prostate cancer screening.  He denies any depression. Past Medical History:  Diagnosis Date   Anxiety    Atrial fibrillation ([place])    Chronic   Cataracts, bilateral    to have left eye cataract surgery on 03/21/17    CHF (congestive heart failure), NYHA class II, chronic, diastolic (HCC)    Colon polyps    last colonoscopy 2015 (tubular adenoma)   Depression    GERD (gastroesophageal reflux disease)    H/O: GI bleed    Recurrent   Hemorrhoids    History of BPH    History of kidney stones    hx of x 1  passed stones   Hypertension    Pernicious anemia    Presence of permanent cardiac pacemaker    PVC (premature ventricular contraction)    S/P mitral valve clip implantation 03/25/2021   Successful transcatheter edge-to-edge mitral valve repair with placement of a MitraClip G4 XTW clip positioned A2/P2 and placement of a MitraClip G4 NTW clip positioned on the lateral side of A2/P2, reducing mitral regurgitation from 4+ at baseline to 1-2+ post procedure   Severe mitral regurgitation    Vitamin D deficiency    Past Surgical History:  Procedure Laterality Date   COLONOSCOPY WITH PROPOFOL N/A 03/17/2017   Procedure: COLONOSCOPY WITH PROPOFOL;  Surgeon: Carol Ada, MD;  Location: [facility];   Service: Endoscopy;  Laterality: N/A;   [place]; ~ 2002   left; right   MITRAL VALVE REPAIR N/A 03/25/2021   Procedure: MITRAL VALVE REPAIR;  Surgeon: Sherren Mocha, MD;  Location: [place] [facility];  Service: Cardiovascular;  Laterality: N/A;   MULTIPLE EXTRACTIONS WITH ALVEOLOPLASTY N/A 03/18/2021   Procedure: MULTIPLE EXTRACTION WITH ALVEOLOPLASTY;  Surgeon: Charlaine Dalton, DMD;  Location: [place];  Service: Dentistry;  Laterality: N/A;   PACEMAKER IMPLANT N/A 03/11/2019   Procedure: PACEMAKER IMPLANT;  Surgeon: Constance Haw, MD;  Location: [place] [facility];  Service: Cardiovascular;  Laterality: N/A;   RIGHT/LEFT HEART CATH AND CORONARY ANGIOGRAPHY N/A 02/24/2021   Procedure: RIGHT/LEFT HEART CATH AND CORONARY ANGIOGRAPHY;  Surgeon: Sherren Mocha, MD;  Location: [place] [facility];  Service: Cardiovascular;  Laterality: N/A;   TEE WITHOUT CARDIOVERSION N/A 12/23/2020   Procedure: TRANSESOPHAGEAL ECHOCARDIOGRAM (TEE);  Surgeon: Freada Bergeron, MD;  Location: [facility];  Service: Cardiovascular;  Laterality: N/A;   TEE WITHOUT CARDIOVERSION N/A 03/25/2021   Procedure: TRANSESOPHAGEAL ECHOCARDIOGRAM (TEE);  Surgeon: Sherren Mocha, MD;  Location: [place] [facility];  Service: Cardiovascular;  Laterality: N/A;   TEMPORARY PACEMAKER N/A 03/10/2019   Procedure: TEMPORARY PACEMAKER;  Surgeon: Martinique, Peter M, MD;  Location: [place] [facility];  Service: Cardiovascular;  Laterality: N/A;   Current Outpatient Medications on File Prior to Visit  Medication Sig Dispense Refill   acetaminophen (TYLENOL) 500 MG tablet Take 500 mg by mouth every 6 (six) hours as needed for mild pain, fever or headache.     albuterol (VENTOLIN HFA) 108 (90 Base) MCG/ACT inhaler INHALE 2 PUFFS EVERY 6 HOURS AS NEEDED FOR WHEEZING OR SHORTNESS OF BREATH 18 g 3   amLODipine (NORVASC) 5 MG tablet Take 1 tablet (5 mg total) by mouth daily. 30 tablet 0   carvedilol  (COREG) 12.5 MG tablet Take 1 tablet (12.5 mg total) by mouth 2 (two) times daily. 180 tablet 3   ferrous sulfate 325 (65 FE) MG tablet Take 325 mg by mouth every other day.      fluticasone (FLONASE) 50 MCG/ACT nasal spray USE 1 SPRAY IN EACH NOSTRIL EVERY DAY 32 g 1   furosemide (LASIX) 40 MG tablet Take 1 tablet (40 mg total) by mouth daily. 90 tablet 3   guaiFENesin-dextromethorphan (ROBITUSSIN DM) 100-10 MG/5ML syrup Take 5 mLs by mouth every 4 (four) hours as needed for cough.     losartan (COZAAR) 50 MG tablet Take 1 tablet (50 mg total) by mouth daily. 90 tablet 3   Multiple Vitamin (MULTIVITAMIN) tablet Take 1 tablet by mouth daily.     multivitamin-lutein (OCUVITE-LUTEIN) CAPS Take 1 capsule by mouth daily.     polyvinyl alcohol (LIQUIFILM TEARS) 1.4 % ophthalmic solution Place 1 drop into both eyes as needed for dry eyes.     potassium chloride (KLOR-CON M) 10 MEQ tablet Take 10 mEq by mouth daily.     spironolactone (ALDACTONE) 25 MG tablet Take 1 tablet (25 mg total) by mouth daily. 30 tablet 0   vitamin B-12 (CYANOCOBALAMIN) 1000 MCG tablet TAKE 1 TABLET EVERY DAY 90 tablet 3   XARELTO 20 MG TABS tablet TAKE 1 TABLET BY MOUTH DAILY  WITH SUPPER 90 tablet 3   No current facility-administered medications on file prior to visit.   No Known Allergies Social History   Socioeconomic History   Marital status: Widowed    Spouse name: Not on file   Number of children: 1   Years of education: Not on file   Highest education level: Not on file  Occupational History   Occupation: Retired  Tobacco Use   Smoking status: Never   Smokeless tobacco: Former    Types: Snuff   Tobacco comments:    03/17/21 "still use snuff daily"  Vaping Use   Vaping Use: Never used  Substance and Sexual Activity   Alcohol use: No   Drug use: No   Sexual activity: Never    Comment: widower, lives with granddaughter and great grandchildren.  Other Topics Concern   Not on file  Social History  Narrative   Lives with grand daughter.         Epworth Sleepiness Scale = 7 (as of 09/21/2015)   Social Determinants of Health   Financial Resource Strain: Low Risk  (07/14/2022)   Overall Financial Resource Strain (CARDIA)    Difficulty of Paying Living Expenses: Not hard at all  Food Insecurity: No Food Insecurity (07/14/2022)   Hunger Vital Sign    Worried About Running Out of Food in the Last Year: Never true    Ran Out of Food in the Last Year: Never true  Transportation Needs: No Transportation Needs (07/14/2022)   PRAPARE - Transportation  Lack of Transportation (Medical): No    Lack of Transportation (Non-Medical): No  Physical Activity: Insufficiently Active (07/14/2022)   Exercise Vital Sign    Days of Exercise per Week: 3 days    Minutes of Exercise per Session: 30 min  Stress: No Stress Concern Present (07/14/2022)   [place]    Feeling of Stress : Not at all  Social Connections: Moderately Isolated (07/14/2022)   Social Connection and Isolation Panel [NHANES]    Frequency of Communication with Friends and Family: More than three times a week    Frequency of Social Gatherings with Friends and Family: Three times a week    Attends Religious Services: More than 4 times per year    Active Member of Clubs or Organizations: No    Attends Archivist Meetings: Never    Marital Status: Widowed  Intimate Partner Violence: Not At Risk (07/14/2022)   Humiliation, Afraid, Rape, and Kick questionnaire    Fear of Current or Ex-Partner: No    Emotionally Abused: No    Physically Abused: No    Sexually Abused: No      Review of Systems  All other systems reviewed and are negative.      Objective:   Physical Exam Vitals reviewed.  Constitutional:      General: He is not in acute distress.    Appearance: Normal appearance. He is normal weight. He is not ill-appearing, toxic-appearing or  diaphoretic.  HENT:     Head: Normocephalic and atraumatic.     Right Ear: Tympanic membrane and ear canal normal.     Left Ear: Tympanic membrane and ear canal normal.     Nose: No congestion or rhinorrhea.     Mouth/Throat:     Mouth: Mucous membranes are moist.     Pharynx: Oropharynx is clear. No oropharyngeal exudate or posterior oropharyngeal erythema.  Neck:     Vascular: No carotid bruit.  Cardiovascular:     Rate and Rhythm: Normal rate. Rhythm irregular.     Heart sounds: Murmur heard.     No friction rub. No gallop.  Pulmonary:     Effort: Pulmonary effort is normal. No respiratory distress.     Breath sounds: Normal breath sounds. No stridor. No wheezing, rhonchi or rales.  Abdominal:     General: Bowel sounds are normal. There is no distension.     Palpations: Abdomen is soft.     Tenderness: There is no abdominal tenderness. There is no right CVA tenderness, guarding or rebound.  Musculoskeletal:     Cervical back: Neck supple. No rigidity.     Right lower leg: No edema.     Left lower leg: No edema.  Lymphadenopathy:     Cervical: No cervical adenopathy.  Skin:    Findings: No rash.  Neurological:     General: No focal deficit present.     Mental Status: He is alert and oriented to person, place, and time. Mental status is at baseline.     Cranial Nerves: No cranial nerve deficit.     Sensory: No sensory deficit.     Motor: No weakness.     Coordination: Coordination normal.     Gait: Gait normal.     Deep Tendon Reflexes: Reflexes normal.  Psychiatric:        Mood and Affect: Mood normal.        Behavior: Behavior normal.  Thought Content: Thought content normal.        Judgment: Judgment normal.           Assessment & Plan:  Chronic systolic congestive heart failure (HCC) - Plan: CBC with Differential/Platelet, Lipid panel, COMPLETE METABOLIC PANEL WITH GFR  H/O mitral valve repair  Atrial fibrillation, persistent ([place])  Essential  hypertension  General medical exam Patient appears euvolemic today on exam.  His lungs are clear.  There is no significant swelling in his legs.  Therefore I see no reason to increase his diuretic dose.  I am concerned about his potassium.  He has not been taking his potassium so we will check a CMP today.  If his potassium is normal we will remove potassium from his medicine list.  If his potassium is low I will have him resume the potassium.  Has a pressure today is well-controlled.  He is in atrial fibrillation however his rate is controlled and he is appropriately anticoagulated with Xarelto.  He denies any melena or hematochezia.  He denies any falls or depression or memory loss

## 2022-09-21 ENCOUNTER — Ambulatory Visit: Payer: 59

## 2022-09-21 ENCOUNTER — Other Ambulatory Visit: Payer: Self-pay

## 2022-09-21 DIAGNOSIS — I442 Atrioventricular block, complete: Secondary | ICD-10-CM | POA: Diagnosis not present

## 2022-09-21 LAB — CBC WITH DIFFERENTIAL/PLATELET
Absolute Monocytes: 656 cells/uL (ref 200–950)
Basophils Absolute: 58 cells/uL (ref 0–200)
Basophils Relative: 0.7 %
Eosinophils Absolute: 116 cells/uL (ref 15–500)
Eosinophils Relative: 1.4 %
HCT: 44 % (ref 38.5–50.0)
Hemoglobin: 15 g/dL (ref 13.2–17.1)
Lymphs Abs: 1195 cells/uL (ref 850–3900)
MCH: 29.9 pg (ref 27.0–33.0)
MCHC: 34.1 g/dL (ref 32.0–36.0)
MCV: 87.6 fL (ref 80.0–100.0)
MPV: 11.3 fL (ref 7.5–12.5)
Monocytes Relative: 7.9 %
Neutro Abs: 6275 cells/uL (ref 1500–7800)
Neutrophils Relative %: 75.6 %
Platelets: 142 10*3/uL (ref 140–400)
RBC: 5.02 10*6/uL (ref 4.20–5.80)
RDW: 13.1 % (ref 11.0–15.0)
Total Lymphocyte: 14.4 %
WBC: 8.3 10*3/uL (ref 3.8–10.8)

## 2022-09-21 LAB — HEMOGLOBIN A1C W/OUT EAG: Hgb A1c MFr Bld: 6.5 % of total Hgb — ABNORMAL HIGH (ref <5.7)

## 2022-09-21 LAB — COMPLETE METABOLIC PANEL WITH GFR
AG Ratio: 1.8 (calc) (ref 1.0–2.5)
ALT: 23 U/L (ref 9–46)
AST: 30 U/L (ref 10–35)
Albumin: 4.3 g/dL (ref 3.6–5.1)
Alkaline phosphatase (APISO): 74 U/L (ref 35–144)
BUN: 22 mg/dL (ref 7–25)
CO2: 29 mmol/L (ref 20–32)
Calcium: 9.8 mg/dL (ref 8.6–10.3)
Chloride: 102 mmol/L (ref 98–110)
Creat: 1.04 mg/dL (ref 0.70–1.22)
Globulin: 2.4 g/dL (calc) (ref 1.9–3.7)
Glucose, Bld: 120 mg/dL — ABNORMAL HIGH (ref 65–99)
Potassium: 4.2 mmol/L (ref 3.5–5.3)
Sodium: 138 mmol/L (ref 135–146)
Total Bilirubin: 1.6 mg/dL — ABNORMAL HIGH (ref 0.2–1.2)
Total Protein: 6.7 g/dL (ref 6.1–8.1)
eGFR: 69 mL/min/{1.73_m2} (ref >=60)

## 2022-09-21 LAB — LIPID PANEL
Cholesterol: 191 mg/dL (ref <200)
HDL: 75 mg/dL (ref >=40)
LDL Cholesterol (Calc): 102 mg/dL (calc) — ABNORMAL HIGH
Non-HDL Cholesterol (Calc): 116 mg/dL (calc) (ref <130)
Total CHOL/HDL Ratio: 2.5 (calc) (ref <5.0)
Triglycerides: 53 mg/dL (ref <150)

## 2022-09-21 LAB — TEST AUTHORIZATION 2

## 2022-09-22 LAB — CUP PACEART REMOTE DEVICE CHECK
Battery Remaining Longevity: 93 mo
Battery Remaining Percentage: 73 %
Battery Voltage: 3.01 V
Brady Statistic RV Percent Paced: 95 %
Date Time Interrogation Session: 20240306093806
Implantable Lead Connection Status: 753985
Implantable Lead Implant Date: 20200824
Implantable Lead Location: 753860
Implantable Pulse Generator Implant Date: 20200824
Lead Channel Impedance Value: 430 Ohm
Lead Channel Pacing Threshold Amplitude: 0.875 V
Lead Channel Pacing Threshold Pulse Width: 0.5 ms
Lead Channel Sensing Intrinsic Amplitude: 10 mV
Lead Channel Setting Pacing Amplitude: 1.125
Lead Channel Setting Pacing Pulse Width: 0.5 ms
Lead Channel Setting Sensing Sensitivity: 0.7 mV
Pulse Gen Model: 1272
Pulse Gen Serial Number: 9140133

## 2022-10-26 NOTE — Progress Notes (Signed)
Remote pacemaker transmission.   

## 2022-10-31 ENCOUNTER — Other Ambulatory Visit: Payer: Self-pay | Admitting: Cardiology

## 2022-10-31 DIAGNOSIS — I10 Essential (primary) hypertension: Secondary | ICD-10-CM

## 2022-10-31 DIAGNOSIS — I4819 Other persistent atrial fibrillation: Secondary | ICD-10-CM

## 2022-12-07 ENCOUNTER — Other Ambulatory Visit: Payer: Self-pay | Admitting: Cardiology

## 2022-12-16 ENCOUNTER — Other Ambulatory Visit: Payer: Self-pay | Admitting: Family Medicine

## 2022-12-16 DIAGNOSIS — I4819 Other persistent atrial fibrillation: Secondary | ICD-10-CM

## 2022-12-16 DIAGNOSIS — I10 Essential (primary) hypertension: Secondary | ICD-10-CM

## 2022-12-16 MED ORDER — AMLODIPINE BESYLATE 5 MG PO TABS: 5.0000 mg | ORAL_TABLET | Freq: Every day | ORAL | 1 refills | Status: DC

## 2022-12-16 NOTE — Telephone Encounter (Signed)
Prescription Request  12/16/2022  LOV: 09/16/2022  What is the name of the medication or equipment?   amLODipine (NORVASC) 5 MG tablet  **90 day script requested**  Have you contacted your pharmacy to request a refill? Yes   Which pharmacy would you like this sent to?  [id number] [place], [place] - [address] [place] [address] Phone: [phone] Fax: [phone]    Patient notified that their request is being sent to the clinical staff for review and that they should receive a response within 2 business days.   Please advise pharmacist at [phone]

## 2022-12-16 NOTE — Telephone Encounter (Signed)
Requested medications are due for refill today.  yes  Requested medications are on the active medications list.  yes  Last refill. 11/01/2022 #90 1 rf  Future visit scheduled.   no  Notes to clinic.  Rx signed by Georgie Chard.    Requested Prescriptions  Pending Prescriptions Disp Refills   amLODipine (NORVASC) 5 MG tablet 90 tablet 1    Sig: Take 1 tablet (5 mg total) by mouth daily.     Cardiovascular: Calcium Channel Blockers 2 Failed - 12/16/2022  8:58 AM      Failed - Valid encounter within last 6 months    Recent Outpatient Visits           1 year ago Need for influenza vaccination   [facility] Donita Brooks, MD   1 year ago Skin lesion of back   [facility] Pickard, Priscille Heidelberg, MD   2 years ago Essential hypertension   [facility] Donita Brooks, MD   2 years ago Heart block AV complete [facility])   [facility] Donita Brooks, MD   3 years ago Benign essential HTN   [facility] Pickard, Priscille Heidelberg, MD              Passed - Last BP in normal range    BP Readings from Last 1 Encounters:  09/16/22 (!) 122/58         Passed - Last Heart Rate in normal range    Pulse Readings from Last 1 Encounters:  09/16/22 67

## 2022-12-21 ENCOUNTER — Ambulatory Visit (INDEPENDENT_AMBULATORY_CARE_PROVIDER_SITE_OTHER): Payer: 59

## 2022-12-21 DIAGNOSIS — I442 Atrioventricular block, complete: Secondary | ICD-10-CM

## 2022-12-21 LAB — CUP PACEART REMOTE DEVICE CHECK
Battery Remaining Longevity: 89 mo
Battery Remaining Percentage: 71 %
Battery Voltage: 2.99 V
Brady Statistic RV Percent Paced: 95 %
Date Time Interrogation Session: 20240605092100
Implantable Lead Connection Status: 753985
Implantable Lead Implant Date: 20200824
Implantable Lead Location: 753860
Implantable Pulse Generator Implant Date: 20200824
Lead Channel Impedance Value: 440 Ohm
Lead Channel Pacing Threshold Amplitude: 0.875 V
Lead Channel Pacing Threshold Pulse Width: 0.5 ms
Lead Channel Sensing Intrinsic Amplitude: 7.2 mV
Lead Channel Setting Pacing Amplitude: 1.125
Lead Channel Setting Pacing Pulse Width: 0.5 ms
Lead Channel Setting Sensing Sensitivity: 0.7 mV
Pulse Gen Model: 1272
Pulse Gen Serial Number: 9140133

## 2023-01-16 NOTE — Progress Notes (Signed)
Remote pacemaker transmission.   

## 2023-01-22 ENCOUNTER — Other Ambulatory Visit: Payer: Self-pay | Admitting: Cardiology

## 2023-01-22 DIAGNOSIS — I10 Essential (primary) hypertension: Secondary | ICD-10-CM

## 2023-02-01 ENCOUNTER — Other Ambulatory Visit: Payer: Self-pay | Admitting: Cardiology

## 2023-02-01 ENCOUNTER — Telehealth: Payer: Medicare Other

## 2023-02-01 DIAGNOSIS — I4819 Other persistent atrial fibrillation: Secondary | ICD-10-CM

## 2023-02-01 DIAGNOSIS — I10 Essential (primary) hypertension: Secondary | ICD-10-CM

## 2023-02-02 ENCOUNTER — Other Ambulatory Visit: Payer: Self-pay | Admitting: Cardiology

## 2023-02-03 ENCOUNTER — Other Ambulatory Visit: Payer: Self-pay | Admitting: Cardiology

## 2023-02-08 ENCOUNTER — Telehealth: Payer: Self-pay | Admitting: Family Medicine

## 2023-02-08 DIAGNOSIS — I10 Essential (primary) hypertension: Secondary | ICD-10-CM

## 2023-02-08 DIAGNOSIS — I4819 Other persistent atrial fibrillation: Secondary | ICD-10-CM

## 2023-02-08 NOTE — Telephone Encounter (Signed)
[facility] and spoke to [place], [facility] about the refill(s) amlodipine requested. Advised it was sent on 12/16/22 #90/1 refill(s). She says it's on hold saying too soon to fill. It can be refilled in August because there was a refill sent to a mail order pharmacy. Patient called and advised of the above, refill sent on 11/01/22 #90 to [facility]. He says that his daughter gets his medications together and she told him he had a few left. He says he is unable to read the bottles. Advised him to let his daughter know of the above and for her to count his pills to make sure he has enough to last until 03/03/23 and call us back if not. He verbalized understanding.

## 2023-02-08 NOTE — Telephone Encounter (Signed)
Prescription Request  02/08/2023  LOV: 09/16/2022  What is the name of the medication or equipment?   amLODipine (NORVASC) 5 MG tablet   Have you contacted your pharmacy to request a refill? Yes   Which pharmacy would you like this sent to?  [id number] [place], [place] - [address] [place] [address] Phone: [phone] Fax: [phone]    Patient notified that their request is being sent to the clinical staff for review and that they should receive a response within 2 business days.   Please advise patient at [phone].

## 2023-02-13 NOTE — Telephone Encounter (Signed)
Patient called to report he found some AMOLDIPINE in his medicine bag and has enough to last him.  Nothing further needed at this time.

## 2023-02-16 ENCOUNTER — Other Ambulatory Visit: Payer: Self-pay | Admitting: Cardiology

## 2023-02-16 DIAGNOSIS — I10 Essential (primary) hypertension: Secondary | ICD-10-CM

## 2023-02-16 DIAGNOSIS — I4819 Other persistent atrial fibrillation: Secondary | ICD-10-CM

## 2023-02-23 ENCOUNTER — Other Ambulatory Visit: Payer: Self-pay | Admitting: Cardiology

## 2023-02-23 DIAGNOSIS — I10 Essential (primary) hypertension: Secondary | ICD-10-CM

## 2023-03-06 ENCOUNTER — Telehealth: Payer: Medicare Other

## 2023-03-22 ENCOUNTER — Ambulatory Visit (INDEPENDENT_AMBULATORY_CARE_PROVIDER_SITE_OTHER): Payer: 59

## 2023-03-22 DIAGNOSIS — I442 Atrioventricular block, complete: Secondary | ICD-10-CM | POA: Diagnosis not present

## 2023-03-22 LAB — CUP PACEART REMOTE DEVICE CHECK
Battery Remaining Longevity: 86 mo
Battery Remaining Percentage: 69 %
Battery Voltage: 2.99 V
Brady Statistic RV Percent Paced: 95 %
Date Time Interrogation Session: 20240904080153
Implantable Lead Connection Status: 753985
Implantable Lead Implant Date: 20200824
Implantable Lead Location: 753860
Implantable Pulse Generator Implant Date: 20200824
Lead Channel Impedance Value: 440 Ohm
Lead Channel Pacing Threshold Amplitude: 1 V
Lead Channel Pacing Threshold Pulse Width: 0.5 ms
Lead Channel Sensing Intrinsic Amplitude: 6 mV
Lead Channel Setting Pacing Amplitude: 1.25 V
Lead Channel Setting Pacing Pulse Width: 0.5 ms
Lead Channel Setting Sensing Sensitivity: 0.7 mV
Pulse Gen Model: 1272
Pulse Gen Serial Number: 9140133

## 2023-03-23 ENCOUNTER — Ambulatory Visit: Payer: 59 | Admitting: Family Medicine

## 2023-03-27 ENCOUNTER — Other Ambulatory Visit: Payer: Self-pay | Admitting: Cardiology

## 2023-03-27 DIAGNOSIS — I10 Essential (primary) hypertension: Secondary | ICD-10-CM

## 2023-03-28 ENCOUNTER — Ambulatory Visit (INDEPENDENT_AMBULATORY_CARE_PROVIDER_SITE_OTHER): Payer: 59 | Admitting: Family Medicine

## 2023-03-28 VITALS — BP 118/82 | HR 70 | Temp 97.6°F | Ht 70.0 in | Wt 190.0 lb

## 2023-03-28 DIAGNOSIS — E118 Type 2 diabetes mellitus with unspecified complications: Secondary | ICD-10-CM

## 2023-03-28 DIAGNOSIS — Z23 Encounter for immunization: Secondary | ICD-10-CM

## 2023-03-28 DIAGNOSIS — I4819 Other persistent atrial fibrillation: Secondary | ICD-10-CM | POA: Diagnosis not present

## 2023-03-28 DIAGNOSIS — I5022 Chronic systolic (congestive) heart failure: Secondary | ICD-10-CM | POA: Diagnosis not present

## 2023-03-28 DIAGNOSIS — I10 Essential (primary) hypertension: Secondary | ICD-10-CM | POA: Diagnosis not present

## 2023-03-28 NOTE — Progress Notes (Signed)
Subjective:    Patient ID: Austin Elk., male    DOB: Jun 07, 1932, 89 y.o.   MRN: [id number]  HPI  Patient had an echocardiogram in October which revealed an ejection fraction of 30 to 35%.  He has atrial fibrillation.  Patient is rate controlled on carvedilol.  He is appropriately anticoagulated on Xarelto.  He denies any bleeding or bruising.  His blood pressure today is excellent.  Denies any intentional self-harm.  Is no pitting edema in his extremities.  He is currently on losartan, carvedilol, and spironolactone for congestive heart failure.  He is also taking Lasix.  However there is no evidence of any fluid overload. Wt Readings from Last 3 Encounters:  03/28/23 190 lb (86.2 kg)  09/16/22 192 lb (87.1 kg)  07/14/22 199 lb (90.3 kg)   Patient has lost 2 pounds over the last 6 months however he does not appear dehydrated. Past Medical History:  Diagnosis Date   Anxiety    Atrial fibrillation (HCC)    Chronic   Cataracts, bilateral    to have left eye cataract surgery on 03/21/17    CHF (congestive heart failure), NYHA class II, chronic, diastolic (HCC)    Colon polyps    last colonoscopy 2015 (tubular adenoma)   Depression    GERD (gastroesophageal reflux disease)    H/O: GI bleed    Recurrent   Hemorrhoids    History of BPH    History of kidney stones    hx of x 1  passed stones   Hypertension    Pernicious anemia    Presence of permanent cardiac pacemaker    PVC (premature ventricular contraction)    S/P mitral valve clip implantation 03/25/2021   Successful transcatheter edge-to-edge mitral valve repair with placement of a MitraClip G4 XTW clip positioned A2/P2 and placement of a MitraClip G4 NTW clip positioned on the lateral side of A2/P2, reducing mitral regurgitation from 4+ at baseline to 1-2+ post procedure   Severe mitral regurgitation    Vitamin D deficiency    Past Surgical History:  Procedure Laterality Date   COLONOSCOPY WITH PROPOFOL N/A  03/17/2017   Procedure: COLONOSCOPY WITH PROPOFOL;  Surgeon: Jeani Hawking, MD;  Location: [facility] ENDOSCOPY;  Service: Endoscopy;  Laterality: N/A;   EYE SURGERY     INGUINAL HERNIA REPAIR  1958; ~ 2002   left; right   MITRAL VALVE REPAIR N/A 03/25/2021   Procedure: MITRAL VALVE REPAIR;  Surgeon: Tonny Bollman, MD;  Location: [facility] INVASIVE CV LAB;  Service: Cardiovascular;  Laterality: N/A;   MULTIPLE EXTRACTIONS WITH ALVEOLOPLASTY N/A 03/18/2021   Procedure: MULTIPLE EXTRACTION WITH ALVEOLOPLASTY;  Surgeon: Sharman Cheek, DMD;  Location: [facility] OR;  Service: Dentistry;  Laterality: N/A;   PACEMAKER IMPLANT N/A 03/11/2019   Procedure: PACEMAKER IMPLANT;  Surgeon: Regan Lemming, MD;  Location: [facility] INVASIVE CV LAB;  Service: Cardiovascular;  Laterality: N/A;   RIGHT/LEFT HEART CATH AND CORONARY ANGIOGRAPHY N/A 02/24/2021   Procedure: RIGHT/LEFT HEART CATH AND CORONARY ANGIOGRAPHY;  Surgeon: Tonny Bollman, MD;  Location: [facility] INVASIVE CV LAB;  Service: Cardiovascular;  Laterality: N/A;   TEE WITHOUT CARDIOVERSION N/A 12/23/2020   Procedure: TRANSESOPHAGEAL ECHOCARDIOGRAM (TEE);  Surgeon: Meriam Sprague, MD;  Location: [facility] ENDOSCOPY;  Service: Cardiovascular;  Laterality: N/A;   TEE WITHOUT CARDIOVERSION N/A 03/25/2021   Procedure: TRANSESOPHAGEAL ECHOCARDIOGRAM (TEE);  Surgeon: Tonny Bollman, MD;  Location: [facility] INVASIVE CV LAB;  Service: Cardiovascular;  Laterality: N/A;   TEMPORARY PACEMAKER N/A 03/10/2019  Procedure: TEMPORARY PACEMAKER;  Surgeon: Swazil[place], Peter M, MD;  Location: [facility] INVASIVE CV LAB;  Service: Cardiovascular;  Laterality: N/A;   Current Outpatient Medications on File Prior to Visit  Medication Sig Dispense Refill   acetaminophen (TYLENOL) 500 MG tablet Take 500 mg by mouth every 6 (six) hours as needed for mild pain, fever or headache.     albuterol (VENTOLIN HFA) 108 (90 Base) MCG/ACT inhaler INHALE 2 PUFFS EVERY 6 HOURS AS NEEDED FOR WHEEZING OR SHORTNESS OF BREATH 18 g 3    amLODipine (NORVASC) 5 MG tablet Take 1 tablet (5 mg total) by mouth daily. 90 tablet 1   carvedilol (COREG) 12.5 MG tablet TAKE 1 TABLET BY MOUTH TWICE  DAILY 60 tablet 0   ferrous sulfate 325 (65 FE) MG tablet Take 325 mg by mouth every other day.      fluticasone (FLONASE) 50 MCG/ACT nasal spray USE 1 SPRAY IN EACH NOSTRIL EVERY DAY 32 g 1   furosemide (LASIX) 40 MG tablet TAKE 1 TABLET BY MOUTH DAILY 100 tablet 2   guaiFENesin-dextromethorphan (ROBITUSSIN DM) 100-10 MG/5ML syrup Take 5 mLs by mouth every 4 (four) hours as needed for cough.     losartan (COZAAR) 50 MG tablet Take 1 tablet (50 mg total) by mouth daily. 90 tablet 3   Multiple Vitamin (MULTIVITAMIN) tablet Take 1 tablet by mouth daily.     multivitamin-lutein (OCUVITE-LUTEIN) CAPS Take 1 capsule by mouth daily.     polyvinyl alcohol (LIQUIFILM TEARS) 1.4 % ophthalmic solution Place 1 drop into both eyes as needed for dry eyes.     potassium chloride (KLOR-CON M) 10 MEQ tablet TAKE 1 TABLET BY MOUTH DAILY 90 tablet 3   spironolactone (ALDACTONE) 25 MG tablet Take 1 tablet (25 mg total) by mouth daily. 30 tablet 0   vitamin B-12 (CYANOCOBALAMIN) 1000 MCG tablet TAKE 1 TABLET EVERY DAY 90 tablet 3   XARELTO 20 MG TABS tablet TAKE 1 TABLET BY MOUTH DAILY  WITH SUPPER 90 tablet 3   No current facility-administered medications on file prior to visit.   No Known Allergies Social History   Socioeconomic History   Marital status: Widowed    Spouse name: Not on file   Number of children: 1   Years of education: Not on file   Highest education level: Not on file  Occupational History   Occupation: Retired  Tobacco Use   Smoking status: Never   Smokeless tobacco: Former    Types: Snuff   Tobacco comments:    03/17/21 "still use snuff daily"  Vaping Use   Vaping status: Never Used  Substance and Sexual Activity   Alcohol use: No   Drug use: No   Sexual activity: Never    Comment: widower, lives with granddaughter and  great grandchildren.  Other Topics Concern   Not on file  Social History Narrative   Lives with grand daughter.         Epworth Sleepiness Scale = 7 (as of 09/21/2015)   Social Determinants of Health   Financial Resource Strain: Low Risk  (07/14/2022)   Overall Financial Resource Strain (CARDIA)    Difficulty of Paying Living Expenses: Not hard at all  Food Insecurity: No Food Insecurity (07/14/2022)   Hunger Vital Sign    Worried About Running Out of Food in the Last Year: Never true    Ran Out of Food in the Last Year: Never true  Transportation Needs: No Transportation Needs (07/14/2022)   PRAPARE -  Administrator, Civil Service (Medical): No    Lack of Transportation (Non-Medical): No  Physical Activity: Insufficiently Active (07/14/2022)   Exercise Vital Sign    Days of Exercise per Week: 3 days    Minutes of Exercise per Session: 30 min  Stress: No Stress Concern Present (07/14/2022)   [employer] of Occupational Health - Occupational Stress Questionnaire    Feeling of Stress : Not at all  Social Connections: Moderately Isolated (07/14/2022)   Social Connection and Isolation Panel [NHANES]    Frequency of Communication with Friends and Family: More than three times a week    Frequency of Social Gatherings with Friends and Family: Three times a week    Attends Religious Services: More than 4 times per year    Active Member of Clubs or Organizations: No    Attends Banker Meetings: Never    Marital Status: Widowed  Intimate Partner Violence: Not At Risk (07/14/2022)   Humiliation, Afraid, Rape, and Kick questionnaire    Fear of Current or Ex-Partner: No    Emotionally Abused: No    Physically Abused: No    Sexually Abused: No      Review of Systems  All other systems reviewed and are negative.      Objective:   Physical Exam Vitals reviewed.  Constitutional:      General: He is not in acute distress.    Appearance: Normal  appearance. He is normal weight. He is not ill-appearing, toxic-appearing or diaphoretic.  HENT:     Head: Normocephalic and atraumatic.     Right Ear: Tympanic membrane and ear canal normal.     Left Ear: Tympanic membrane and ear canal normal.     Nose: No congestion or rhinorrhea.     Mouth/Throat:     Mouth: Mucous membranes are moist.     Pharynx: Oropharynx is clear. No oropharyngeal exudate or posterior oropharyngeal erythema.  Neck:     Vascular: No carotid bruit.  Cardiovascular:     Rate and Rhythm: Normal rate. Rhythm irregular.     Heart sounds: Murmur heard.     No friction rub. No gallop.  Pulmonary:     Effort: Pulmonary effort is normal. No respiratory distress.     Breath sounds: Normal breath sounds. No stridor. No wheezing, rhonchi or rales.  Abdominal:     General: Bowel sounds are normal. There is no distension.     Palpations: Abdomen is soft.     Tenderness: There is no abdominal tenderness. There is no right CVA tenderness, guarding or rebound.  Musculoskeletal:     Cervical back: Neck supple. No rigidity.     Right lower leg: No edema.     Left lower leg: No edema.  Lymphadenopathy:     Cervical: No cervical adenopathy.  Skin:    Findings: No rash.  Neurological:     General: No focal deficit present.     Mental Status: He is alert and oriented to person, place, and time. Mental status is at baseline.     Cranial Nerves: No cranial nerve deficit.     Sensory: No sensory deficit.     Motor: No weakness.     Coordination: Coordination normal.     Gait: Gait normal.     Deep Tendon Reflexes: Reflexes normal.  Psychiatric:        Mood and Affect: Mood normal.        Behavior: Behavior normal.  Thought Content: Thought content normal.        Judgment: Judgment normal.           Assessment & Plan:  Atrial fibrillation, persistent (HCC)  Chronic systolic congestive heart failure (HCC) - Plan: CBC with Differential/Platelet, COMPLETE  METABOLIC PANEL WITH GFR, Lipid panel  Essential hypertension - Plan: CBC with Differential/Platelet, COMPLETE METABOLIC PANEL WITH GFR, Lipid panel  Controlled type 2 diabetes mellitus with complication, without long-term current use of insulin (HCC) - Plan: Hemoglobin A1c  Needs flu shot - Plan: Flu vaccine trivalent PF, 6mos and older(Flulaval,Afluria,Fluarix,Fluzone) Patient will get his flu shot today.  He appears euvolemic.  I will check a CMP as well as a CBC a lipid panel and an A1c.  Monitor his renal function and potassium.  I did ask the patient to discontinue potassium 10 mEq a day because he is on spironolactone.  His blood pressure is outstanding.  There is no evidence of fluid overload.  His heart rate is well-controlled.

## 2023-03-29 LAB — CBC WITH DIFFERENTIAL/PLATELET
Absolute Monocytes: 468 {cells}/uL (ref 200–950)
Basophils Absolute: 52 {cells}/uL (ref 0–200)
Basophils Relative: 0.8 %
Eosinophils Absolute: 91 {cells}/uL (ref 15–500)
Eosinophils Relative: 1.4 %
HCT: 42.2 % (ref 38.5–50.0)
Hemoglobin: 14 g/dL (ref 13.2–17.1)
Lymphs Abs: 1242 {cells}/uL (ref 850–3900)
MCH: 29.7 pg (ref 27.0–33.0)
MCHC: 33.2 g/dL (ref 32.0–36.0)
MCV: 89.4 fL (ref 80.0–100.0)
MPV: 11.3 fL (ref 7.5–12.5)
Monocytes Relative: 7.2 %
Neutro Abs: 4648 {cells}/uL (ref 1500–7800)
Neutrophils Relative %: 71.5 %
Platelets: 133 10*3/uL — ABNORMAL LOW (ref 140–400)
RBC: 4.72 10*6/uL (ref 4.20–5.80)
RDW: 13.5 % (ref 11.0–15.0)
Total Lymphocyte: 19.1 %
WBC: 6.5 10*3/uL (ref 3.8–10.8)

## 2023-03-29 LAB — COMPLETE METABOLIC PANEL WITH GFR
AG Ratio: 1.8 (calc) (ref 1.0–2.5)
ALT: 21 U/L (ref 9–46)
AST: 32 U/L (ref 10–35)
Albumin: 4.4 g/dL (ref 3.6–5.1)
Alkaline phosphatase (APISO): 63 U/L (ref 35–144)
BUN: 14 mg/dL (ref 7–25)
CO2: 29 mmol/L (ref 20–32)
Calcium: 9.9 mg/dL (ref 8.6–10.3)
Chloride: 100 mmol/L (ref 98–110)
Creat: 0.77 mg/dL (ref 0.70–1.22)
Globulin: 2.4 g/dL (ref 1.9–3.7)
Glucose, Bld: 124 mg/dL — ABNORMAL HIGH (ref 65–99)
Potassium: 3.6 mmol/L (ref 3.5–5.3)
Sodium: 139 mmol/L (ref 135–146)
Total Bilirubin: 1.7 mg/dL — ABNORMAL HIGH (ref 0.2–1.2)
Total Protein: 6.8 g/dL (ref 6.1–8.1)
eGFR: 86 mL/min/{1.73_m2} (ref >=60)

## 2023-03-29 LAB — LIPID PANEL
Cholesterol: 194 mg/dL (ref <200)
HDL: 76 mg/dL (ref >=40)
LDL Cholesterol (Calc): 103 mg/dL — ABNORMAL HIGH
Non-HDL Cholesterol (Calc): 118 mg/dL (ref <130)
Total CHOL/HDL Ratio: 2.6 (calc) (ref <5.0)
Triglycerides: 65 mg/dL (ref <150)

## 2023-03-29 LAB — HEMOGLOBIN A1C
Hgb A1c MFr Bld: 6.4 %{Hb} — ABNORMAL HIGH (ref <5.7)
Mean Plasma Glucose: 137 mg/dL
eAG (mmol/L): 7.6 mmol/L

## 2023-03-31 ENCOUNTER — Other Ambulatory Visit: Payer: Self-pay

## 2023-04-04 NOTE — Progress Notes (Signed)
Remote pacemaker transmission.   

## 2023-04-18 ENCOUNTER — Ambulatory Visit: Payer: Medicare HMO

## 2023-04-18 DIAGNOSIS — Z23 Encounter for immunization: Secondary | ICD-10-CM

## 2023-04-18 DIAGNOSIS — T1490XA Injury, unspecified, initial encounter: Secondary | ICD-10-CM

## 2023-04-18 NOTE — Progress Notes (Signed)
Pt scraped arm on metal door molding last night. Skin peeled back after scrape. Came in today for dressing and Tdap. Injury observed and dressed by Dr. Tanya Nones. Order given for Tdap. Tdap given to L deltoid, IM, as ordered. Mjp,lpn

## 2023-05-23 ENCOUNTER — Other Ambulatory Visit: Payer: Self-pay

## 2023-05-23 ENCOUNTER — Telehealth: Payer: Self-pay

## 2023-05-23 DIAGNOSIS — I4819 Other persistent atrial fibrillation: Secondary | ICD-10-CM

## 2023-05-23 MED ORDER — RIVAROXABAN 20 MG PO TABS: 20.0000 mg | ORAL_TABLET | Freq: Every day | ORAL | 1 refills | Status: DC

## 2023-05-23 NOTE — Telephone Encounter (Signed)
Pt called in to request a refill of this med XARELTO 20 MG TABS tablet [[id number]]. Pt stated that he is completely out of this med.   PHARMACY: [id number] [place], [place] - [address] [place] [address] Phone: [phone]  Fax: [phone] DEA #: [id number]  CB#: [phone]

## 2023-05-29 ENCOUNTER — Other Ambulatory Visit: Payer: Self-pay | Admitting: Family Medicine

## 2023-05-30 ENCOUNTER — Other Ambulatory Visit: Payer: Self-pay | Admitting: Family Medicine

## 2023-05-30 ENCOUNTER — Emergency Department (HOSPITAL_BASED_OUTPATIENT_CLINIC_OR_DEPARTMENT_OTHER): Payer: Medicare HMO

## 2023-05-30 ENCOUNTER — Encounter (HOSPITAL_BASED_OUTPATIENT_CLINIC_OR_DEPARTMENT_OTHER): Payer: Self-pay

## 2023-05-30 ENCOUNTER — Other Ambulatory Visit: Payer: Self-pay

## 2023-05-30 ENCOUNTER — Emergency Department (HOSPITAL_BASED_OUTPATIENT_CLINIC_OR_DEPARTMENT_OTHER)
Admission: EM | Admit: 2023-05-30 | Discharge: 2023-05-30 | Disposition: A | Discharge status: Home / Self Care | Payer: Medicare HMO | Attending: Emergency Medicine | Admitting: Emergency Medicine

## 2023-05-30 DIAGNOSIS — M7121 Synovial cyst of popliteal space [Baker], right knee: Secondary | ICD-10-CM | POA: Insufficient documentation

## 2023-05-30 DIAGNOSIS — L03115 Cellulitis of right lower limb: Secondary | ICD-10-CM | POA: Insufficient documentation

## 2023-05-30 DIAGNOSIS — Z79899 Other long term (current) drug therapy: Secondary | ICD-10-CM | POA: Insufficient documentation

## 2023-05-30 DIAGNOSIS — R2241 Localized swelling, mass and lump, right lower limb: Secondary | ICD-10-CM | POA: Diagnosis present

## 2023-05-30 DIAGNOSIS — L539 Erythematous condition, unspecified: Secondary | ICD-10-CM | POA: Diagnosis not present

## 2023-05-30 DIAGNOSIS — M79604 Pain in right leg: Secondary | ICD-10-CM | POA: Diagnosis not present

## 2023-05-30 DIAGNOSIS — Z7901 Long term (current) use of anticoagulants: Secondary | ICD-10-CM | POA: Diagnosis not present

## 2023-05-30 DIAGNOSIS — I10 Essential (primary) hypertension: Secondary | ICD-10-CM | POA: Diagnosis not present

## 2023-05-30 LAB — CBC WITH DIFFERENTIAL/PLATELET
Abs Immature Granulocytes: 0.06 10*3/uL (ref 0.00–0.07)
Basophils Absolute: 0 10*3/uL (ref 0.0–0.1)
Basophils Relative: 0 %
Eosinophils Absolute: 0.1 10*3/uL (ref 0.0–0.5)
Eosinophils Relative: 1 %
HCT: 37.3 % — ABNORMAL LOW (ref 39.0–52.0)
Hemoglobin: 12.3 g/dL — ABNORMAL LOW (ref 13.0–17.0)
Immature Granulocytes: 1 %
Lymphocytes Relative: 11 %
Lymphs Abs: 1 10*3/uL (ref 0.7–4.0)
MCH: 30.3 pg (ref 26.0–34.0)
MCHC: 33 g/dL (ref 30.0–36.0)
MCV: 91.9 fL (ref 80.0–100.0)
Monocytes Absolute: 0.7 10*3/uL (ref 0.1–1.0)
Monocytes Relative: 7 %
Neutro Abs: 7.9 10*3/uL — ABNORMAL HIGH (ref 1.7–7.7)
Neutrophils Relative %: 80 %
Platelets: 112 10*3/uL — ABNORMAL LOW (ref 150–400)
RBC: 4.06 MIL/uL — ABNORMAL LOW (ref 4.22–5.81)
RDW: 14.2 % (ref 11.5–15.5)
WBC: 9.8 10*3/uL (ref 4.0–10.5)
nRBC: 0 % (ref 0.0–0.2)

## 2023-05-30 LAB — BASIC METABOLIC PANEL
Anion gap: 5 (ref 5–15)
BUN: 23 mg/dL (ref 8–23)
CO2: 29 mmol/L (ref 22–32)
Calcium: 9 mg/dL (ref 8.9–10.3)
Chloride: 102 mmol/L (ref 98–111)
Creatinine, Ser: 0.96 mg/dL (ref 0.61–1.24)
GFR, Estimated: >60 mL/min (ref >60)
Glucose, Bld: 116 mg/dL — ABNORMAL HIGH (ref 70–99)
Potassium: 3.8 mmol/L (ref 3.5–5.1)
Sodium: 136 mmol/L (ref 135–145)

## 2023-05-30 MED ORDER — IBUPROFEN 400 MG PO TABS: 400.0000 mg | ORAL_TABLET | Freq: Three times a day (TID) | ORAL | 0 refills | Status: DC | PRN

## 2023-05-30 MED ORDER — CEPHALEXIN 500 MG PO CAPS: 500.0000 mg | ORAL_CAPSULE | Freq: Four times a day (QID) | ORAL | 0 refills | Status: DC

## 2023-05-30 MED ORDER — NAPROXEN 250 MG PO TABS
250.0000 mg | ORAL_TABLET | Freq: Once | ORAL | Status: AC
  Administered 2023-05-30: 250 mg via ORAL
  Filled 2023-05-30: qty 1

## 2023-05-30 MED ORDER — CEPHALEXIN 250 MG PO CAPS
500.0000 mg | ORAL_CAPSULE | Freq: Once | ORAL | Status: AC
  Administered 2023-05-30: 500 mg via ORAL
  Filled 2023-05-30: qty 2

## 2023-05-30 NOTE — Discharge Instructions (Addendum)
The workup in the emergency room is negative for blood clot.  However, there is evidence of Bakers cyst which is a cyst that sits behind your knee.  Occasionally that can cause swelling and discomfort over the leg.  Our suspicion is that most likely you have cellulitis.  We are starting you on antibiotics, which should help with both cellulitis and Baker's cyst.  Additionally take ibuprofen over the next 3 days.  Apply cold compresses around the back of your knee.  We recommend that you follow-up with your primary care doctor in about 7 to 10 days.  If your symptoms are persistent, then they might have to send you to an orthopedic surgeon.

## 2023-05-30 NOTE — Telephone Encounter (Signed)
Requested Prescriptions  Pending Prescriptions Disp Refills   spironolactone (ALDACTONE) 25 MG tablet [Pharmacy Med Name: SPIRONOLACTONE 25 MG TABLET] 90 tablet 0    Sig: TAKE 1 TABLET (25 MG TOTAL) BY MOUTH DAILY.     Cardiovascular: Diuretics - Aldosterone Antagonist Failed - 05/29/2023  1:22 AM      Failed - Valid encounter within last 6 months    Recent Outpatient Visits           2 years ago Need for influenza vaccination   [facility] Donita Brooks, MD   2 years ago Skin lesion of back   [facility] Pickard, Priscille Heidelberg, MD   2 years ago Essential hypertension   [facility] Donita Brooks, MD   3 years ago Heart block AV complete [facility])   [facility] Donita Brooks, MD   3 years ago Benign essential HTN   [facility] Pickard, Priscille Heidelberg, MD              Passed - Cr in normal range and within 180 days    Creat  Date Value Ref Range Status  03/28/2023 0.77 0.70 - 1.22 mg/dL Final   Creatinine, Ser  Date Value Ref Range Status  05/30/2023 0.96 0.61 - 1.24 mg/dL Final         Passed - K in normal range and within 180 days    Potassium  Date Value Ref Range Status  05/30/2023 3.8 3.5 - 5.1 mmol/L Final         Passed - Na in normal range and within 180 days    Sodium  Date Value Ref Range Status  05/30/2023 136 135 - 145 mmol/L Final  12/17/2021 140 134 - 144 mmol/L Final         Passed - eGFR is 30 or above and within 180 days    GFR, Est African American  Date Value Ref Range Status  12/01/2020 85 > OR = 60 mL/min/1.65m2 Final   GFR, Est Non African American  Date Value Ref Range Status  12/01/2020 73 > OR = 60 mL/min/1.45m2 Final   GFR, Estimated  Date Value Ref Range Status  05/30/2023 >60 >60 mL/min Final    Comment:    (NOTE) Calculated using the CKD-EPI Creatinine Equation (2021)    eGFR  Date Value Ref Range Status  03/28/2023 86 > OR = 60  mL/min/1.4m2 Final  12/17/2021 83 >59 mL/min/1.73 Final         Passed - Last BP in normal range    BP Readings from Last 1 Encounters:  05/30/23 134/70

## 2023-05-30 NOTE — Telephone Encounter (Signed)
Copied from CRM [id number]. Topic: Clinical - Medication Refill >> May 30, 2023 11:27 AM Deaijah H wrote: Most Recent Primary Care Visit:  Provider: Venia Carbon K  Department: [facility]  Visit Type: NURSE VISIT  Date: 04/18/2023  Medication: ***  Has the patient contacted their pharmacy?  (Agent: If no, request that the patient contact the pharmacy for the refill. If patient does not wish to contact the pharmacy document the reason why and proceed with request.) (Agent: If yes, when and what did the pharmacy advise?)  Is this the correct pharmacy for this prescription?  If no, delete pharmacy and type the correct one.  This is the patient's preferred pharmacy:  [id number] [place], [place] - [address] AT [address] [place] [address] Phone: [phone] Fax: [phone]  [facility] - [place], [place] - [address] [place] [address] Phone: [phone] Fax: [phone]   Has the prescription been filled recently?   Is the patient out of the medication?   Has the patient been seen for an appointment in the last year OR does the patient have an upcoming appointment?   Can we respond through MyChart?   Agent: Please be advised that Rx refills may take up to 3 business days. We ask that you follow-up with your pharmacy.

## 2023-05-30 NOTE — ED Triage Notes (Signed)
In for eval of right leg swelling and erythema worsening yesterday.

## 2023-05-30 NOTE — ED Notes (Signed)
Dc instructions reviewed with patient. Patient voiced understanding. Dc with belongings.  °

## 2023-05-30 NOTE — Telephone Encounter (Signed)
Copied from CRM [id number]. Topic: Clinical - Medication Refill >> May 30, 2023 11:44 AM Deaijah H wrote: Most Recent Primary Care Visit:  Provider: Venia Carbon K  Department: [facility]  Visit Type: NURSE VISIT  Date: 04/18/2023  Medication: ***  Has the patient contacted their pharmacy?  (Agent: If no, request that the patient contact the pharmacy for the refill. If patient does not wish to contact the pharmacy document the reason why and proceed with request.) (Agent: If yes, when and what did the pharmacy advise?)  Is this the correct pharmacy for this prescription?  If no, delete pharmacy and type the correct one.  This is the patient's preferred pharmacy:  [id number] [place], [place] - [address] AT [facility] [address] [place] [address] Phone: [phone] Fax: [phone]  [facility] - [place], [place] - [address] [place] [address] Phone: [phone] Fax: [phone]   Has the prescription been filled recently?   Is the patient out of the medication?   Has the patient been seen for an appointment in the last year OR does the patient have an upcoming appointment?   Can we respond through MyChart?   Agent: Please be advised that Rx refills may take up to 3 business days. We ask that you follow-up with your pharmacy.

## 2023-05-30 NOTE — ED Provider Notes (Signed)
[place] [facility] AT [facility] Provider Note   CSN: [id number] Arrival date & time: 05/30/23  4098     History  Chief Complaint  Patient presents with   Leg Swelling    Austin Gray. is a 89 y.o. male.  HPI    89 year old male comes to the emergency room with his daughter with chief complaint of right lower extremity swelling. Patient has history of hypertension, hyperlipidemia and cardiomyopathy.  According to the patient, he started having swelling over his right leg about 3 or 4 days ago.  Over the weekend the symptoms have progressed.  He describes the pain as burning type pain.  Review of system is negative for any nausea, vomiting, fevers, chills.  Patient has no history of PE, DVT.  Patient's functional status is not significantly impacted besides discomfort due to this condition.  Home Medications Prior to Admission medications   Medication Sig Start Date End Date Taking? Authorizing Provider  cephALEXin (KEFLEX) 500 MG capsule Take 1 capsule (500 mg total) by mouth 4 (four) times daily. 05/30/23  Yes Derwood Kaplan, MD  ibuprofen (ADVIL) 400 MG tablet Take 1 tablet (400 mg total) by mouth every 8 (eight) hours as needed. 05/30/23  Yes Derwood Kaplan, MD  acetaminophen (TYLENOL) 500 MG tablet Take 500 mg by mouth every 6 (six) hours as needed for mild pain, fever or headache.    [provider]  albuterol (VENTOLIN HFA) 108 (90 Base) MCG/ACT inhaler INHALE 2 PUFFS EVERY 6 HOURS AS NEEDED FOR WHEEZING OR SHORTNESS OF BREATH 12/02/21   Donita Brooks, MD  amLODipine (NORVASC) 5 MG tablet Take 1 tablet (5 mg total) by mouth daily. 12/16/22   Donita Brooks, MD  carvedilol (COREG) 12.5 MG tablet TAKE 1 TABLET BY MOUTH TWICE  DAILY 03/28/23   Rollene Rotunda, MD  ferrous sulfate 325 (65 FE) MG tablet Take 325 mg by mouth every other day.     [provider]  fluticasone (FLONASE) 50 MCG/ACT nasal spray USE 1 SPRAY IN EACH  NOSTRIL EVERY DAY 11/24/21   Donita Brooks, MD  furosemide (LASIX) 40 MG tablet TAKE 1 TABLET BY MOUTH DAILY 02/03/23   Filbert Schilder, NP  guaiFENesin-dextromethorphan (ROBITUSSIN DM) 100-10 MG/5ML syrup Take 5 mLs by mouth every 4 (four) hours as needed for cough.    [provider]  losartan (COZAAR) 50 MG tablet Take 1 tablet (50 mg total) by mouth daily. 05/27/22   Donita Brooks, MD  Multiple Vitamin (MULTIVITAMIN) tablet Take 1 tablet by mouth daily.    [provider]  multivitamin-lutein (OCUVITE-LUTEIN) CAPS Take 1 capsule by mouth daily.    [provider]  polyvinyl alcohol (LIQUIFILM TEARS) 1.4 % ophthalmic solution Place 1 drop into both eyes as needed for dry eyes.    [provider]  rivaroxaban (XARELTO) 20 MG TABS tablet Take 1 tablet (20 mg total) by mouth daily with supper. 05/23/23   Donita Brooks, MD  spironolactone (ALDACTONE) 25 MG tablet Take 1 tablet (25 mg total) by mouth daily. 05/27/22   Donita Brooks, MD  vitamin B-12 (CYANOCOBALAMIN) 1000 MCG tablet TAKE 1 TABLET EVERY DAY 09/28/21   Donita Brooks, MD      Allergies    Patient has no known allergies.    Review of Systems   Review of Systems  All other systems reviewed and are negative.   Physical Exam Updated Vital Signs BP 134/70  Pulse 74   Temp 98.2 F (36.8 C) (Oral)   Resp (!) 28   Ht 5\' 8"  (1.727 m)   Wt 88.5 kg   SpO2 97%   BMI 29.65 kg/m  Physical Exam Vitals and nursing note reviewed.  Constitutional:      Appearance: He is well-developed.  HENT:     Head: Atraumatic.  Eyes:     Extraocular Movements: Extraocular movements intact.     Pupils: Pupils are equal, round, and reactive to light.  Cardiovascular:     Rate and Rhythm: Normal rate.  Pulmonary:     Effort: Pulmonary effort is normal.  Musculoskeletal:        General: Swelling and tenderness present. No deformity.     Cervical back: Neck supple.     Comments: Right lower  extremity has erythema, edema and is warm to touch.  Skin:    General: Skin is warm.     Findings: Erythema present.  Neurological:     Mental Status: He is alert and oriented to person, place, and time.     ED Results / Procedures / Treatments   Labs (all labs ordered are listed, but only abnormal results are displayed) Labs Reviewed  CBC WITH DIFFERENTIAL/PLATELET - Abnormal; Notable for the following components:      Result Value   RBC 4.06 (*)    Hemoglobin 12.3 (*)    HCT 37.3 (*)    Platelets 112 (*)    Neutro Abs 7.9 (*)    All other components within normal limits  BASIC METABOLIC PANEL - Abnormal; Notable for the following components:   Glucose, Bld 116 (*)    All other components within normal limits    EKG None  Radiology US Venous Img Lower Right (DVT Study)  Result Date: 05/30/2023 CLINICAL DATA:  Right lower extremity edema, pain and erythema. EXAM: RIGHT LOWER EXTREMITY VENOUS DOPPLER ULTRASOUND TECHNIQUE: Gray-scale sonography with graded compression, as well as color Doppler and duplex ultrasound were performed to evaluate the lower extremity deep venous systems from the level of the common femoral vein and including the common femoral, femoral, profunda femoral, popliteal and calf veins including the posterior tibial, peroneal and gastrocnemius veins when visible. The superficial great saphenous vein was also interrogated. Spectral Doppler was utilized to evaluate flow at rest and with distal augmentation maneuvers in the common femoral, femoral and popliteal veins. COMPARISON:  None Available. FINDINGS: Contralateral Common Femoral Vein: Respiratory phasicity is normal and symmetric with the symptomatic side. No evidence of thrombus. Normal compressibility. Common Femoral Vein: No evidence of thrombus. Normal compressibility, respiratory phasicity and response to augmentation. Saphenofemoral Junction: No evidence of thrombus. Normal compressibility and flow on  color Doppler imaging. Profunda Femoral Vein: No evidence of thrombus. Normal compressibility and flow on color Doppler imaging. Femoral Vein: No evidence of thrombus. Normal compressibility, respiratory phasicity and response to augmentation. Popliteal Vein: No evidence of thrombus. Normal compressibility, respiratory phasicity and response to augmentation. Calf Veins: No evidence of thrombus. Normal compressibility and flow on color Doppler imaging. Superficial Great Saphenous Vein: No evidence of thrombus. Normal compressibility. Venous Reflux:  None. Other Findings: No evidence of superficial thrombophlebitis. Right popliteal fossa fluid collection measures roughly 3.5 x 0.9 x 3.0 cm and is consistent with a Baker's cyst. IMPRESSION: 1. No evidence of right lower extremity deep venous thrombosis. 2. 3.5 cm right popliteal fossa Baker's cyst. Electronically Signed   By: Irish Lack M.D.   On: 05/30/2023 09:04  Procedures Procedures    Medications Ordered in ED Medications  cephALEXin (KEFLEX) capsule 500 mg (has no administration in time range)  naproxen (NAPROSYN) tablet 250 mg (has no administration in time range)    ED Course/ Medical Decision Making/ A&P                                 Medical Decision Making Amount and/or Complexity of Data Reviewed Labs: ordered.  Risk Prescription drug management.   This patient presents to the ED with chief complaint(s) of right lower extremity swelling and discomfort with pertinent past medical history of cardiomyopathy and no previous history of DVT.The complaint involves an extensive differential diagnosis and also carries with it a high risk of complications and morbidity.    The differential diagnosis includes : Cellulitis, Baker's cyst, acute DVT, myositis, hematoma.  The initial plan is to get basic labs and ultrasound DVT.   Additional history obtained: Additional history obtained from family Records reviewed Primary Care  Documents  Independent labs interpretation:  The following labs were independently interpreted: Patient's CBC and BMP are normal.  No significant leukocytosis.  Treatment and Reassessment: Ultrasound is negative for DVT, but there is positive Baker's cyst. Symptoms could be because of Baker's cyst or cellulitis. Treatment will be NSAID and antibiotics with follow-up with PCP.  If patient's symptoms have not resolved or have not improved, then they might have to send him to an orthopedist for drainage of the Baker's cyst.   Final Clinical Impression(s) / ED Diagnoses Final diagnoses:  Synovial cyst of right popliteal space  Cellulitis of right lower extremity    Rx / DC Orders ED Discharge Orders          Ordered    cephALEXin (KEFLEX) 500 MG capsule  4 times daily        05/30/23 1023    ibuprofen (ADVIL) 400 MG tablet  Every 8 hours PRN        05/30/23 1024              Derwood Kaplan, MD 05/30/23 1029

## 2023-06-02 ENCOUNTER — Other Ambulatory Visit: Payer: Self-pay

## 2023-06-02 ENCOUNTER — Telehealth: Payer: Self-pay

## 2023-06-02 DIAGNOSIS — I10 Essential (primary) hypertension: Secondary | ICD-10-CM

## 2023-06-02 MED ORDER — LOSARTAN POTASSIUM 50 MG PO TABS: 50.0000 mg | ORAL_TABLET | Freq: Every day | ORAL | 1 refills | Status: DC

## 2023-06-02 MED ORDER — FUROSEMIDE 40 MG PO TABS: 40.0000 mg | ORAL_TABLET | Freq: Every day | ORAL | 1 refills | Status: DC

## 2023-06-02 MED ORDER — CARVEDILOL 12.5 MG PO TABS: 12.5000 mg | ORAL_TABLET | Freq: Two times a day (BID) | ORAL | 1 refills | Status: DC

## 2023-06-02 NOTE — Telephone Encounter (Signed)
Pt's daughter called in to check on status of these refills:  furosemide (LASIX) 40 MG tablet [[id number]] losartan (COZAAR) 50 MG tablet [[id number]] carvedilol (COREG) 12.5 MG tablet [[id number]] Pt is completely out of this med  LOV: 03/28/23  Please call [phone]

## 2023-06-21 ENCOUNTER — Ambulatory Visit (INDEPENDENT_AMBULATORY_CARE_PROVIDER_SITE_OTHER): Payer: Medicare HMO

## 2023-06-21 DIAGNOSIS — I442 Atrioventricular block, complete: Secondary | ICD-10-CM

## 2023-06-27 ENCOUNTER — Ambulatory Visit (INDEPENDENT_AMBULATORY_CARE_PROVIDER_SITE_OTHER): Payer: Self-pay

## 2023-06-27 DIAGNOSIS — I442 Atrioventricular block, complete: Secondary | ICD-10-CM

## 2023-06-27 LAB — CUP PACEART REMOTE DEVICE CHECK
Battery Remaining Longevity: 85 mo
Battery Remaining Percentage: 67 %
Battery Voltage: 2.99 V
Brady Statistic RV Percent Paced: 94 %
Date Time Interrogation Session: 20241209115216
Implantable Lead Connection Status: 753985
Implantable Lead Implant Date: 20200824
Implantable Lead Location: 753860
Implantable Pulse Generator Implant Date: 20200824
Lead Channel Impedance Value: 430 Ohm
Lead Channel Pacing Threshold Amplitude: 0.875 V
Lead Channel Pacing Threshold Pulse Width: 0.5 ms
Lead Channel Sensing Intrinsic Amplitude: 10 mV
Lead Channel Setting Pacing Amplitude: 1.125
Lead Channel Setting Pacing Pulse Width: 0.5 ms
Lead Channel Setting Sensing Sensitivity: 0.7 mV
Pulse Gen Model: 1272
Pulse Gen Serial Number: 9140133

## 2023-07-20 ENCOUNTER — Ambulatory Visit: Payer: Medicare HMO | Admitting: *Deleted

## 2023-07-20 DIAGNOSIS — Z0000 Encounter for general adult medical examination without abnormal findings: Secondary | ICD-10-CM | POA: Diagnosis not present

## 2023-07-20 NOTE — Progress Notes (Signed)
 Subjective:   Austin Sheckler. is a 89 y.o. male who presents for Medicare Annual/Subsequent preventive examination.  Visit Complete: Virtual I connected with  Austin Gray. on 07/20/23 by a audio enabled telemedicine application and verified that I am speaking with the correct person using two identifiers.  Patient unable to a video visit.   Patient Location: Home  Provider Location: Home Office  I discussed the limitations of evaluation and management by telemedicine. The patient expressed understanding and agreed to proceed.  Vital Signs: Because this visit was a virtual/telehealth visit, some criteria may be missing or patient reported. Any vitals not documented were not able to be obtained and vitals that have been documented are patient reported.    Cardiac Risk Factors include: advanced age (>6men, >62 women);male gender;hypertension;smoking/ tobacco exposure     Objective:    There were no vitals filed for this visit. There is no height or weight on file to calculate BMI.     07/20/2023    9:12 AM 05/30/2023    7:31 AM 07/14/2022   11:13 AM 04/29/2021    2:17 PM 03/26/2021    9:00 AM 03/23/2021    1:31 PM 03/18/2021    7:46 AM  Advanced Directives  Does Patient Have a Medical Advance Directive? Yes No Yes No No No No  Type of Advance Directive Healthcare Power of Attorney  Living will;Healthcare Power of Attorney      Does patient want to make changes to medical advance directive?   No - Patient declined      Copy of Healthcare Power of Attorney in Chart? No - copy requested  No - copy requested      Would patient like information on creating a medical advance directive?  No - Patient declined  No - Patient declined No - Patient declined No - Patient declined No - Patient declined    Current Medications (verified) Outpatient Encounter Medications as of 07/20/2023  Medication Sig   acetaminophen  (TYLENOL ) 500 MG tablet Take 500 mg by mouth every 6 (six)  hours as needed for mild pain, fever or headache.   albuterol  (VENTOLIN  HFA) 108 (90 Base) MCG/ACT inhaler INHALE 2 PUFFS EVERY 6 HOURS AS NEEDED FOR WHEEZING OR SHORTNESS OF BREATH   amLODipine  (NORVASC ) 5 MG tablet Take 1 tablet (5 mg total) by mouth daily.   carvedilol  (COREG ) 12.5 MG tablet Take 1 tablet (12.5 mg total) by mouth 2 (two) times daily.   ferrous sulfate  325 (65 FE) MG tablet Take 325 mg by mouth every other day.    fluticasone  (FLONASE ) 50 MCG/ACT nasal spray USE 1 SPRAY IN EACH NOSTRIL EVERY DAY   furosemide  (LASIX ) 40 MG tablet Take 1 tablet (40 mg total) by mouth daily.   guaiFENesin -dextromethorphan  (ROBITUSSIN DM) 100-10 MG/5ML syrup Take 5 mLs by mouth every 4 (four) hours as needed for cough.   ibuprofen  (ADVIL ) 400 MG tablet Take 1 tablet (400 mg total) by mouth every 8 (eight) hours as needed.   losartan  (COZAAR ) 50 MG tablet Take 1 tablet (50 mg total) by mouth daily.   Multiple Vitamin (MULTIVITAMIN) tablet Take 1 tablet by mouth daily.   multivitamin-lutein (OCUVITE-LUTEIN) CAPS Take 1 capsule by mouth daily.   polyvinyl alcohol (LIQUIFILM TEARS) 1.4 % ophthalmic solution Place 1 drop into both eyes as needed for dry eyes.   rivaroxaban  (XARELTO ) 20 MG TABS tablet Take 1 tablet (20 mg total) by mouth daily with supper.   spironolactone  (ALDACTONE )  25 MG tablet TAKE 1 TABLET (25 MG TOTAL) BY MOUTH DAILY.   vitamin B-12 (CYANOCOBALAMIN ) 1000 MCG tablet TAKE 1 TABLET EVERY DAY   cephALEXin  (KEFLEX ) 500 MG capsule Take 1 capsule (500 mg total) by mouth 4 (four) times daily.   No facility-administered encounter medications on file as of 07/20/2023.    Allergies (verified) Patient has no known allergies.   History: Past Medical History:  Diagnosis Date   Anxiety    Atrial fibrillation (HCC)    Chronic   Cataracts, bilateral    to have left eye cataract surgery on 03/21/17    CHF (congestive heart failure), NYHA class II, chronic, diastolic (HCC)    Colon polyps     last colonoscopy 2015 (tubular adenoma)   Depression    GERD (gastroesophageal reflux disease)    H/O: GI bleed    Recurrent   Hemorrhoids    History of BPH    History of kidney stones    hx of x 1  passed stones   Hypertension    Pernicious anemia    Presence of permanent cardiac pacemaker    PVC (premature ventricular contraction)    S/P mitral valve clip implantation 03/25/2021   Successful transcatheter edge-to-edge mitral valve repair with placement of a MitraClip G4 XTW clip positioned A2/P2 and placement of a MitraClip G4 NTW clip positioned on the lateral side of A2/P2, reducing mitral regurgitation from 4+ at baseline to 1-2+ post procedure   Severe mitral regurgitation    Vitamin D deficiency    Past Surgical History:  Procedure Laterality Date   COLONOSCOPY WITH PROPOFOL  N/A 03/17/2017   Procedure: COLONOSCOPY WITH PROPOFOL ;  Surgeon: Rollin Dover, MD;  Location: [facility] END[facility]COPY;  Service: Endoscopy;  Laterality: N/A;   EYE SURGERY     INGUINAL HERNIA REPAIR  1958; ~ 2002   left; right   MULTIPLE EXTRACTIONS WITH ALVEOLOPLASTY N/A 03/18/2021   Procedure: MULTIPLE EXTRACTION WITH ALVEOLOPLASTY;  Surgeon: Celena Lum NOVAK, DMD;  Location: [facility] OR;  Service: Dentistry;  Laterality: N/A;   PACEMAKER IMPLANT N/A 03/11/2019   Procedure: PACEMAKER IMPLANT;  Surgeon: Inocencio Soyla Lunger, MD;  Location: [facility] INVASIVE CV LAB;  Service: Cardiovascular;  Laterality: N/A;   RIGHT/LEFT HEART CATH AND CORONARY ANGIOGRAPHY N/A 02/24/2021   Procedure: RIGHT/LEFT HEART CATH AND CORONARY ANGIOGRAPHY;  Surgeon: Wonda Sharper, MD;  Location: [facility] INVASIVE CV LAB;  Service: Cardiovascular;  Laterality: N/A;   TEE WITHOUT CARDIOVERSION N/A 12/23/2020   Procedure: TRANSESOPHAGEAL ECHOCARDIOGRAM (TEE);  Surgeon: Hobart Powell BRAVO, MD;  Location: [facility] ENDOSCOPY;  Service: Cardiovascular;  Laterality: N/A;   TEE WITHOUT CARDIOVERSION N/A 03/25/2021   Procedure: TRANSESOPHAGEAL ECHOCARDIOGRAM (TEE);   Surgeon: Wonda Sharper, MD;  Location: [facility] INVASIVE CV LAB;  Service: Cardiovascular;  Laterality: N/A;   TEMPORARY PACEMAKER N/A 03/10/2019   Procedure: TEMPORARY PACEMAKER;  Surgeon: Jordan, Peter M, MD;  Location: [facility] INVASIVE CV LAB;  Service: Cardiovascular;  Laterality: N/A;   TRANSCATHETER MITRAL EDGE TO EDGE REPAIR N/A 03/25/2021   Procedure: MITRAL VALVE REPAIR;  Surgeon: Wonda Sharper, MD;  Location: [facility] INVASIVE CV LAB;  Service: Cardiovascular;  Laterality: N/A;   Family History  Problem Relation Age of Onset   Stroke Father 15   Cancer Mother    Social History   Socioeconomic History   Marital status: Widowed    Spouse name: Not on file   Number of children: 1   Years of education: Not on file   Highest education level: Not on file  Occupational History   Occupation: Retired  Tobacco Use   Smoking status: Never   Smokeless tobacco: Former    Types: Snuff   Tobacco comments:    03/17/21 still use snuff daily  Vaping Use   Vaping status: Never Used  Substance and Sexual Activity   Alcohol use: No   Drug use: No   Sexual activity: Never    Comment: widower, lives with granddaughter and great grandchildren.  Other Topics Concern   Not on file  Social History Narrative   Lives with grand daughter.         Epworth Sleepiness Scale = 7 (as of 09/21/2015)   Social Drivers of Health   Financial Resource Strain: Low Risk  (07/20/2023)   Overall Financial Resource Strain (CARDIA)    Difficulty of Paying Living Expenses: Not hard at all  Food Insecurity: No Food Insecurity (07/20/2023)   Hunger Vital Sign    Worried About Running Out of Food in the Last Year: Never true    Ran Out of Food in the Last Year: Never true  Transportation Needs: No Transportation Needs (07/20/2023)   PRAPARE - Administrator, Civil Service (Medical): No    Lack of Transportation (Non-Medical): No  Physical Activity: Inactive (07/20/2023)   Exercise Vital Sign    Days of Exercise  per Week: 0 days    Minutes of Exercise per Session: 0 min  Stress: No Stress Concern Present (07/20/2023)   [employer] of Occupational Health - Occupational Stress Questionnaire    Feeling of Stress : Not at all  Social Connections: Socially Isolated (07/20/2023)   Social Connection and Isolation Panel [NHANES]    Frequency of Communication with Friends and Family: More than three times a week    Frequency of Social Gatherings with Friends and Family: Never    Attends Religious Services: Never    Database Administrator or Organizations: No    Attends Engineer, Structural: Not on file    Marital Status: Widowed    Tobacco Counseling Counseling given: Not Answered Tobacco comments: 03/17/21 still use snuff daily   Clinical Intake:  Pre-visit preparation completed: Yes  Pain : No/denies pain     Diabetes: No  How often do you need to have someone help you when you read instructions, pamphlets, or other written materials from your doctor or pharmacy?: 1 - Never  Interpreter Needed?: No  Information entered by :: Mliss Graff LPN   Activities of Daily Living    07/20/2023    9:04 AM  In your present state of health, do you have any difficulty performing the following activities:  Hearing? 0  Vision? 1  Difficulty concentrating or making decisions? 0  Walking or climbing stairs? 0  Dressing or bathing? 0  Doing errands, shopping? 1  Preparing Food and eating ? Y  Using the Toilet? N  In the past six months, have you accidently leaked urine? N  Do you have problems with loss of bowel control? N  Managing your Medications? N  Managing your Finances? N  Housekeeping or managing your Housekeeping? Y    Patient Care Team: Duanne Butler DASEN, MD as PCP - General (Family Medicine) Inocencio Soyla Lunger, MD as PCP - Electrophysiology (Cardiology) Lavona Agent, MD as PCP - Cardiology (Cardiology) Nicholaus Sherlean CROME, [facility] (Inactive) as Pharmacist  (Pharmacist) Daneen Damien BROCKS, NP as Nurse Practitioner (Cardiology)  Indicate any recent Medical Services you may have received from other than [facility]  providers in the past year (date may be approximate).     Assessment:   This is a routine wellness examination for Austin Gray.  Hearing/Vision screen Hearing Screening - Comments:: Patient does not wear hearing aids  Vision Screening - Comments:: Not up to date   Goals Addressed             This Visit's Progress    Patient Stated       Continue current lifestyle       Depression Screen    07/20/2023    9:07 AM 03/28/2023    9:59 AM 03/28/2023    9:21 AM 09/16/2022   10:13 AM 07/14/2022   10:39 AM 04/29/2021    2:11 PM 04/05/2021   11:49 AM  PHQ 2/9 Scores  PHQ - 2 Score 3 1 2 1  0 0 0  PHQ- 9 Score 4 4         Fall Risk    07/20/2023    9:01 AM 03/28/2023    9:58 AM 03/28/2023    9:21 AM 09/16/2022   10:11 AM 07/14/2022   10:37 AM  Fall Risk   Falls in the past year? 0 0 0 0 0  Number falls in past yr: 0 0  0 0  Injury with Fall? 0 0  0 0  Risk for fall due to :    Impaired balance/gait;Impaired mobility No Fall Risks  Follow up Falls evaluation completed;Education provided;Falls prevention discussed   Education provided;Falls prevention discussed Falls evaluation completed;Education provided;Falls prevention discussed    MEDICARE RISK AT HOME: Medicare Risk at Home Any stairs in or around the home?: No If so, are there any without handrails?: No Home free of loose throw rugs in walkways, pet beds, electrical cords, etc?: Yes Adequate lighting in your home to reduce risk of falls?: Yes Life alert?: No Use of a cane, walker or w/c?: Yes (walking stick, rolling walker) Grab bars in the bathroom?: No Shower chair or bench in shower?: No Elevated toilet seat or a handicapped toilet?: No  TIMED UP AND GO:  Was the test performed?  No    Cognitive Function:        07/20/2023    9:09 AM 07/14/2022   11:42 AM 04/29/2021     2:22 PM  6CIT Screen  What Year? 0 points 0 points 0 points  What month? 0 points 0 points 0 points  What time? 0 points 0 points 0 points  Count back from 20 2 points 0 points 0 points  Months in reverse 2 points 0 points 0 points  Repeat phrase 2 points 2 points 2 points  Total Score 6 points 2 points 2 points    Immunizations Immunization History  Administered Date(s) Administered   Fluad Quad(high Dose 65+) 03/22/2019, 04/30/2020, 04/05/2021, 05/27/2022   Influenza Split 05/09/2012   Influenza Whole 04/17/2009   Influenza, High Dose Seasonal PF 04/24/2018   Influenza, Seasonal, Injecte, Preservative Fre 03/28/2023   Influenza,inj,Quad PF,6+ Mos 03/22/2013, 03/27/2014, 04/06/2015, 04/07/2016, 03/31/2017   PFIZER(Purple Top)SARS-COV-2 Vaccination 10/18/2019, 11/09/2019, 07/01/2020   PNEUMOCOCCAL CONJUGATE-20 05/27/2022   Pneumococcal Conjugate-13 12/09/2014   Pneumococcal Polysaccharide-23 04/17/2000   Td 06/17/2002   Tdap 06/29/2015, 04/18/2023    TDAP status: Up to date  Flu Vaccine status: Up to date  Pneumococcal vaccine status: Up to date  Covid-19 vaccine status: Information provided on how to obtain vaccines.   Qualifies for Shingles Vaccine? Yes   Zostavax completed No   Shingrix Completed?:  No.    Education has been provided regarding the importance of this vaccine. Patient has been advised to call insurance company to determine out of pocket expense if they have not yet received this vaccine. Advised may also receive vaccine at local pharmacy or Health Dept. Verbalized acceptance and understanding.  Screening Tests Health Maintenance  Topic Date Due   Zoster Vaccines- Shingrix (1 of 2) 08/15/2023 (Originally 02/21/1951)   COVID-19 Vaccine (4 - 2024-25 season) 08/23/2023 (Originally 03/19/2023)   Medicare Annual Wellness (AWV)  07/19/2024   DTaP/Tdap/Td (4 - Td or Tdap) 04/17/2033   Pneumonia Vaccine 98+ Years old  Completed   INFLUENZA VACCINE  Completed    HPV VACCINES  Aged Out    Health Maintenance  There are no preventive care reminders to display for this patient.   Colorectal cancer screening: No longer required.   Lung Cancer Screening: (Low Dose CT Chest recommended if Age 94-80 years, 20 pack-year currently smoking OR have quit w/in 15years.) does not qualify.   Lung Cancer Screening Referral:   Additional Screening:  Hepatitis C Screening: does not qualify  Vision Screening: Recommended annual ophthalmology exams for early detection of glaucoma and other disorders of the eye. Is the patient up to date with their annual eye exam?  No  Who is the provider or what is the name of the office in which the patient attends annual eye exams?  If pt is not established with a provider, would they like to be referred to a provider to establish care? No .   Dental Screening: Recommended annual dental exams for proper oral hygiene    Community Resource Referral / Chronic Care Management: CRR required this visit?  No   CCM required this visit?  No     Plan:     I have personally reviewed and noted the following in the patient's chart:   Medical and social history Use of alcohol, tobacco or illicit drugs  Current medications and supplements including opioid prescriptions. Patient is not currently taking opioid prescriptions. Functional ability and status Nutritional status Physical activity Advanced directives List of other physicians Hospitalizations, surgeries, and ER visits in previous 12 months Vitals Screenings to include cognitive, depression, and falls Referrals and appointments  In addition, I have reviewed and discussed with patient certain preventive protocols, quality metrics, and best practice recommendations. A written personalized care plan for preventive services as well as general preventive health recommendations were provided to patient.     Mliss Graff, LPN   02/21/7973   After Visit Summary:  (MyChart) Due to this being a telephonic visit, the after visit summary with patients personalized plan was offered to patient via MyChart   Nurse Notes:

## 2023-07-20 NOTE — Patient Instructions (Signed)
 [name] , Thank you for taking time to come for your Medicare Wellness Visit. I appreciate your ongoing commitment to your health goals. Please review the following plan we discussed and let me know if I can assist you in the future.   Screening recommendations/referrals: Colonoscopy: no longer required Recommended yearly ophthalmology/optometry visit for glaucoma screening and checkup Recommended yearly dental visit for hygiene and checkup  Vaccinations: Influenza vaccine: up to date Pneumococcal vaccine: up to date Tdap vaccine: up to date Shingles vaccine: Education provided    Advanced directives: Education provided     Preventive Care 65 Years and Older, Male Preventive care refers to lifestyle choices and visits with your health care provider that can promote health and wellness. What does preventive care include? A yearly physical exam. This is also called an annual well check. Dental exams once or twice a year. Routine eye exams. Ask your health care provider how often you should have your eyes checked. Personal lifestyle choices, including: Daily care of your teeth and gums. Regular physical activity. Eating a healthy diet. Avoiding tobacco and drug use. Limiting alcohol use. Practicing safe sex. Taking low doses of aspirin  every day. Taking vitamin and mineral supplements as recommended by your health care provider. What happens during an annual well check? The services and screenings done by your health care provider during your annual well check will depend on your age, overall health, lifestyle risk factors, and family history of disease. Counseling  Your health care provider may ask you questions about your: Alcohol use. Tobacco use. Drug use. Emotional well-being. Home and relationship well-being. Sexual activity. Eating habits. History of falls. Memory and ability to understand (cognition). Work and work astronomer. Screening  You may have the  following tests or measurements: Height, weight, and BMI. Blood pressure. Lipid and cholesterol levels. These may be checked every 5 years, or more frequently if you are over 70 years old. Skin check. Lung cancer screening. You may have this screening every year starting at age 59 if you have a 30-pack-year history of smoking and currently smoke or have quit within the past 15 years. Fecal occult blood test (FOBT) of the stool. You may have this test every year starting at age 42. Flexible sigmoidoscopy or colonoscopy. You may have a sigmoidoscopy every 5 years or a colonoscopy every 10 years starting at age 14. Prostate cancer screening. Recommendations will vary depending on your family history and other risks. Hepatitis C blood test. Hepatitis B blood test. Sexually transmitted disease (STD) testing. Diabetes screening. This is done by checking your blood sugar (glucose) after you have not eaten for a while (fasting). You may have this done every 1-3 years. Abdominal aortic aneurysm (AAA) screening. You may need this if you are a current or former smoker. Osteoporosis. You may be screened starting at age 77 if you are at high risk. Talk with your health care provider about your test results, treatment options, and if necessary, the need for more tests. Vaccines  Your health care provider may recommend certain vaccines, such as: Influenza vaccine. This is recommended every year. Tetanus, diphtheria, and acellular pertussis (Tdap, Td) vaccine. You may need a Td booster every 10 years. Zoster vaccine. You may need this after age 71. Pneumococcal 13-valent conjugate (PCV13) vaccine. One dose is recommended after age 74. Pneumococcal polysaccharide (PPSV23) vaccine. One dose is recommended after age 66. Talk to your health care provider about which screenings and vaccines you need and how often you need them.  This information is not intended to replace advice given to you by your health care  provider. Make sure you discuss any questions you have with your health care provider. Document Released: 07/31/2015 Document Revised: 03/23/2016 Document Reviewed: 05/05/2015 Elsevier Interactive Patient Education  2017 [employer].  Fall Prevention in the Home Falls can cause injuries. They can happen to people of all ages. There are many things you can do to make your home safe and to help prevent falls. What can I do on the outside of my home? Regularly fix the edges of walkways and driveways and fix any cracks. Remove anything that might make you trip as you walk through a door, such as a raised step or threshold. Trim any bushes or trees on the path to your home. Use bright outdoor lighting. Clear any walking paths of anything that might make someone trip, such as rocks or tools. Regularly check to see if handrails are loose or broken. Make sure that both sides of any steps have handrails. Any raised decks and porches should have guardrails on the edges. Have any leaves, snow, or ice cleared regularly. Use sand or salt on walking paths during winter. Clean up any spills in your garage right away. This includes oil or grease spills. What can I do in the bathroom? Use night lights. Install grab bars by the toilet and in the tub and shower. Do not use towel bars as grab bars. Use non-skid mats or decals in the tub or shower. If you need to sit down in the shower, use a plastic, non-slip stool. Keep the floor dry. Clean up any water that spills on the floor as soon as it happens. Remove soap buildup in the tub or shower regularly. Attach bath mats securely with double-sided non-slip rug tape. Do not have throw rugs and other things on the floor that can make you trip. What can I do in the bedroom? Use night lights. Make sure that you have a light by your bed that is easy to reach. Do not use any sheets or blankets that are too big for your bed. They should not hang down onto the  floor. Have a firm chair that has side arms. You can use this for support while you get dressed. Do not have throw rugs and other things on the floor that can make you trip. What can I do in the kitchen? Clean up any spills right away. Avoid walking on wet floors. Keep items that you use a lot in easy-to-reach places. If you need to reach something above you, use a strong step stool that has a grab bar. Keep electrical cords out of the way. Do not use floor polish or wax that makes floors slippery. If you must use wax, use non-skid floor wax. Do not have throw rugs and other things on the floor that can make you trip. What can I do with my stairs? Do not leave any items on the stairs. Make sure that there are handrails on both sides of the stairs and use them. Fix handrails that are broken or loose. Make sure that handrails are as long as the stairways. Check any carpeting to make sure that it is firmly attached to the stairs. Fix any carpet that is loose or worn. Avoid having throw rugs at the top or bottom of the stairs. If you do have throw rugs, attach them to the floor with carpet tape. Make sure that you have a light switch at the  top of the stairs and the bottom of the stairs. If you do not have them, ask someone to add them for you. What else can I do to help prevent falls? Wear shoes that: Do not have high heels. Have rubber bottoms. Are comfortable and fit you well. Are closed at the toe. Do not wear sandals. If you use a stepladder: Make sure that it is fully opened. Do not climb a closed stepladder. Make sure that both sides of the stepladder are locked into place. Ask someone to hold it for you, if possible. Clearly mark and make sure that you can see: Any grab bars or handrails. First and last steps. Where the edge of each step is. Use tools that help you move around (mobility aids) if they are needed. These include: Canes. Walkers. Scooters. Crutches. Turn on the  lights when you go into a dark area. Replace any light bulbs as soon as they burn out. Set up your furniture so you have a clear path. Avoid moving your furniture around. If any of your floors are uneven, fix them. If there are any pets around you, be aware of where they are. Review your medicines with your doctor. Some medicines can make you feel dizzy. This can increase your chance of falling. Ask your doctor what other things that you can do to help prevent falls. This information is not intended to replace advice given to you by your health care provider. Make sure you discuss any questions you have with your health care provider. Document Released: 04/30/2009 Document Revised: 12/10/2015 Document Reviewed: 08/08/2014 Elsevier Interactive Patient Education  2017 [employer].

## 2023-08-04 NOTE — Progress Notes (Signed)
Remote pacemaker transmission.   

## 2023-08-16 ENCOUNTER — Other Ambulatory Visit: Payer: Self-pay | Admitting: Family Medicine

## 2023-08-16 DIAGNOSIS — I4819 Other persistent atrial fibrillation: Secondary | ICD-10-CM

## 2023-08-16 DIAGNOSIS — I10 Essential (primary) hypertension: Secondary | ICD-10-CM

## 2023-08-16 NOTE — Telephone Encounter (Signed)
Prescription Request  08/16/2023  LOV: 03/28/2023  What is the name of the medication or equipment?   amLODipine (NORVASC) 5 MG tablet  **Original script was for 90 days**  Have you contacted your pharmacy to request a refill? Yes   Which pharmacy would you like this sent to?  [id number] [place], [place] - [address] [place] [address] Phone: [phone] Fax: [phone]    Patient notified that their request is being sent to the clinical staff for review and that they should receive a response within 2 business days.   Please advise pharmacist.

## 2023-08-17 MED ORDER — AMLODIPINE BESYLATE 5 MG PO TABS: 5.0000 mg | ORAL_TABLET | Freq: Every day | ORAL | 1 refills | Status: DC

## 2023-08-17 NOTE — Telephone Encounter (Signed)
Requested Prescriptions  Pending Prescriptions Disp Refills   amLODipine (NORVASC) 5 MG tablet 90 tablet 1    Sig: Take 1 tablet (5 mg total) by mouth daily.     Cardiovascular: Calcium Channel Blockers 2 Failed - 08/17/2023  3:58 PM      Failed - Valid encounter within last 6 months    Recent Outpatient Visits           2 years ago Need for influenza vaccination   [facility] Donita Brooks, MD   2 years ago Skin lesion of back   [facility] Pickard, Priscille Heidelberg, MD   2 years ago Essential hypertension   [facility] Donita Brooks, MD   3 years ago Heart block AV complete [facility])   [facility] Donita Brooks, MD   4 years ago Benign essential HTN   [facility] Pickard, Priscille Heidelberg, MD              Passed - Last BP in normal range    BP Readings from Last 1 Encounters:  05/30/23 134/70         Passed - Last Heart Rate in normal range    Pulse Readings from Last 1 Encounters:  05/30/23 74

## 2023-08-19 ENCOUNTER — Other Ambulatory Visit: Payer: Self-pay | Admitting: Family Medicine

## 2023-08-21 NOTE — Telephone Encounter (Signed)
Requested medication (s) are due for refill today: yes  Requested medication (s) are on the active medication list: yes  Last refill:  05/30/23  Future visit scheduled: no  Notes to clinic:  Unable to refill per protocol, appointment needed.      Requested Prescriptions  Pending Prescriptions Disp Refills   spironolactone (ALDACTONE) 25 MG tablet [Pharmacy Med Name: SPIRONOLACTONE 25 MG TABLET] 90 tablet 0    Sig: TAKE 1 TABLET (25 MG TOTAL) BY MOUTH DAILY.     Cardiovascular: Diuretics - Aldosterone Antagonist Failed - 08/21/2023  3:58 PM      Failed - Valid encounter within last 6 months    Recent Outpatient Visits           2 years ago Need for influenza vaccination   [facility] Donita Brooks, MD   2 years ago Skin lesion of back   [facility] Pickard, Priscille Heidelberg, MD   2 years ago Essential hypertension   [facility] Donita Brooks, MD   3 years ago Heart block AV complete [facility])   [facility] Donita Brooks, MD   4 years ago Benign essential HTN   [facility] Pickard, Priscille Heidelberg, MD              Passed - Cr in normal range and within 180 days    Creat  Date Value Ref Range Status  03/28/2023 0.77 0.70 - 1.22 mg/dL Final   Creatinine, Ser  Date Value Ref Range Status  05/30/2023 0.96 0.61 - 1.24 mg/dL Final         Passed - K in normal range and within 180 days    Potassium  Date Value Ref Range Status  05/30/2023 3.8 3.5 - 5.1 mmol/L Final         Passed - Na in normal range and within 180 days    Sodium  Date Value Ref Range Status  05/30/2023 136 135 - 145 mmol/L Final  12/17/2021 140 134 - 144 mmol/L Final         Passed - eGFR is 30 or above and within 180 days    GFR, Est African American  Date Value Ref Range Status  12/01/2020 85 > OR = 60 mL/min/1.74m2 Final   GFR, Est Non African American  Date Value Ref Range Status  12/01/2020 73 > OR = 60  mL/min/1.96m2 Final   GFR, Estimated  Date Value Ref Range Status  05/30/2023 >60 >60 mL/min Final    Comment:    (NOTE) Calculated using the CKD-EPI Creatinine Equation (2021)    eGFR  Date Value Ref Range Status  03/28/2023 86 > OR = 60 mL/min/1.40m2 Final  12/17/2021 83 >59 mL/min/1.73 Final         Passed - Last BP in normal range    BP Readings from Last 1 Encounters:  05/30/23 134/70

## 2023-08-23 ENCOUNTER — Ambulatory Visit: Payer: Self-pay | Admitting: Family Medicine

## 2023-08-23 NOTE — Telephone Encounter (Signed)
 Copied from CRM [id number]. Topic: Clinical - Prescription Issue >> Aug 23, 2023  8:09 AM Montie POUR wrote: Reason for CRM: Artelia called and he cannot afford rivaroxaban  (XARELTO ) 20 MG TABS tablet. He wants to know if there is another medication to take that his insurance will cover. He lost his [employer] and he is trying to get back >> Aug 23, 2023  8:12 AM Montie POUR wrote: Please give him a call back at [phone]   Chief Complaint: Medication Request Disposition: [] ED /[] Urgent Care (no appt availability in office) / [] Appointment(In office/virtual)/ []  [facility]/ [] Home Care/ [] Refused Recommended Disposition /[] [facility]/ [x]  Follow-up with PCP Additional Notes: Requesting another medication due to cost and lack of insurance coverage.   Please contact patient to discuss alternative medication or a new medication please.

## 2023-08-24 ENCOUNTER — Other Ambulatory Visit: Payer: Self-pay

## 2023-08-24 MED ORDER — WARFARIN SODIUM 5 MG PO TABS: 5.0000 mg | ORAL_TABLET | Freq: Every day | ORAL | 3 refills | Status: DC

## 2023-08-31 ENCOUNTER — Other Ambulatory Visit: Payer: Medicare HMO

## 2023-09-05 ENCOUNTER — Other Ambulatory Visit: Payer: Medicare HMO

## 2023-09-05 DIAGNOSIS — I5032 Chronic diastolic (congestive) heart failure: Secondary | ICD-10-CM

## 2023-09-05 LAB — PT WITH INR/FINGERSTICK
INR, fingerstick: 1.7 {ratio} — ABNORMAL HIGH
PT, fingerstick: 20.6 s — ABNORMAL HIGH (ref 10.5–13.1)

## 2023-09-05 NOTE — Addendum Note (Signed)
Addended by: Arta Silence on: 09/05/2023 08:18 AM   Modules accepted: Orders

## 2023-09-12 ENCOUNTER — Other Ambulatory Visit: Payer: Medicare HMO

## 2023-09-12 DIAGNOSIS — E118 Type 2 diabetes mellitus with unspecified complications: Secondary | ICD-10-CM | POA: Diagnosis not present

## 2023-09-12 DIAGNOSIS — I5022 Chronic systolic (congestive) heart failure: Secondary | ICD-10-CM

## 2023-09-12 DIAGNOSIS — E785 Hyperlipidemia, unspecified: Secondary | ICD-10-CM

## 2023-09-12 DIAGNOSIS — I4819 Other persistent atrial fibrillation: Secondary | ICD-10-CM

## 2023-09-12 DIAGNOSIS — I10 Essential (primary) hypertension: Secondary | ICD-10-CM | POA: Diagnosis not present

## 2023-09-12 LAB — PT WITH INR/FINGERSTICK
INR, fingerstick: 2.3 {ratio} — ABNORMAL HIGH
PT, fingerstick: 28 s — ABNORMAL HIGH (ref 10.5–13.1)

## 2023-09-13 LAB — CBC WITH DIFFERENTIAL/PLATELET
Absolute Lymphocytes: 1773 {cells}/uL (ref 850–3900)
Absolute Monocytes: 564 {cells}/uL (ref 200–950)
Basophils Absolute: 43 {cells}/uL (ref 0–200)
Basophils Relative: 0.7 %
Eosinophils Absolute: 112 {cells}/uL (ref 15–500)
Eosinophils Relative: 1.8 %
HCT: 42.3 % (ref 38.5–50.0)
Hemoglobin: 13.7 g/dL (ref 13.2–17.1)
MCH: 29.1 pg (ref 27.0–33.0)
MCHC: 32.4 g/dL (ref 32.0–36.0)
MCV: 90 fL (ref 80.0–100.0)
MPV: 11.3 fL (ref 7.5–12.5)
Monocytes Relative: 9.1 %
Neutro Abs: 3708 {cells}/uL (ref 1500–7800)
Neutrophils Relative %: 59.8 %
Platelets: 142 10*3/uL (ref 140–400)
RBC: 4.7 10*6/uL (ref 4.20–5.80)
RDW: 13 % (ref 11.0–15.0)
Total Lymphocyte: 28.6 %
WBC: 6.2 10*3/uL (ref 3.8–10.8)

## 2023-09-13 LAB — COMPLETE METABOLIC PANEL WITH GFR
AG Ratio: 1.5 (calc) (ref 1.0–2.5)
ALT: 17 U/L (ref 9–46)
AST: 26 U/L (ref 10–35)
Albumin: 4 g/dL (ref 3.6–5.1)
Alkaline phosphatase (APISO): 77 U/L (ref 35–144)
BUN: 17 mg/dL (ref 7–25)
CO2: 30 mmol/L (ref 20–32)
Calcium: 9.7 mg/dL (ref 8.6–10.3)
Chloride: 101 mmol/L (ref 98–110)
Creat: 1.06 mg/dL (ref 0.70–1.22)
Globulin: 2.6 g/dL (ref 1.9–3.7)
Glucose, Bld: 117 mg/dL — ABNORMAL HIGH (ref 65–99)
Potassium: 4 mmol/L (ref 3.5–5.3)
Sodium: 138 mmol/L (ref 135–146)
Total Bilirubin: 1 mg/dL (ref 0.2–1.2)
Total Protein: 6.6 g/dL (ref 6.1–8.1)
eGFR: 67 mL/min/{1.73_m2} (ref >=60)

## 2023-09-13 LAB — LIPID PANEL
Cholesterol: 205 mg/dL — ABNORMAL HIGH (ref <200)
HDL: 69 mg/dL (ref >=40)
LDL Cholesterol (Calc): 119 mg/dL — ABNORMAL HIGH
Non-HDL Cholesterol (Calc): 136 mg/dL — ABNORMAL HIGH (ref <130)
Total CHOL/HDL Ratio: 3 (calc) (ref <5.0)
Triglycerides: 74 mg/dL (ref <150)

## 2023-09-13 LAB — HEMOGLOBIN A1C
Hgb A1c MFr Bld: 6.6 %{Hb} — ABNORMAL HIGH (ref <5.7)
Mean Plasma Glucose: 143 mg/dL
eAG (mmol/L): 7.9 mmol/L

## 2023-09-18 ENCOUNTER — Other Ambulatory Visit: Payer: Self-pay | Admitting: Family Medicine

## 2023-09-18 DIAGNOSIS — I10 Essential (primary) hypertension: Secondary | ICD-10-CM

## 2023-09-19 ENCOUNTER — Telehealth: Payer: Self-pay | Admitting: Family Medicine

## 2023-09-19 NOTE — Telephone Encounter (Signed)
 Copied from CRM [id number]. Topic: Clinical - Medication Question >> Sep 19, 2023  9:06 AM Fuller Mandril wrote: Reason for CRM: Patient called request to know the name of medication that provider took him off of either last appt or appt before last. Thinks it was Losartan but I dont show that that was removed. He is aware of the Coumadin in place of Xarelto. States that is working great that's not the one he has questions about. States provider wrote on bottle do no take this one.  Would also like to know if correct med list and be printed and ready for him at next appt. Thank You

## 2023-09-20 ENCOUNTER — Ambulatory Visit: Payer: 59

## 2023-09-26 ENCOUNTER — Ambulatory Visit (INDEPENDENT_AMBULATORY_CARE_PROVIDER_SITE_OTHER): Payer: Self-pay

## 2023-09-26 DIAGNOSIS — I442 Atrioventricular block, complete: Secondary | ICD-10-CM

## 2023-10-02 LAB — CUP PACEART REMOTE DEVICE CHECK
Battery Remaining Longevity: 82 mo
Battery Remaining Percentage: 65 %
Battery Voltage: 2.99 V
Brady Statistic RV Percent Paced: 94 %
Date Time Interrogation Session: 20250316141528
Implantable Lead Connection Status: 753985
Implantable Lead Implant Date: 20200824
Implantable Lead Location: 753860
Implantable Pulse Generator Implant Date: 20200824
Lead Channel Impedance Value: 430 Ohm
Lead Channel Pacing Threshold Amplitude: 0.875 V
Lead Channel Pacing Threshold Pulse Width: 0.5 ms
Lead Channel Sensing Intrinsic Amplitude: 6.8 mV
Lead Channel Setting Pacing Amplitude: 1.125
Lead Channel Setting Pacing Pulse Width: 0.5 ms
Lead Channel Setting Sensing Sensitivity: 0.7 mV
Pulse Gen Model: 1272
Pulse Gen Serial Number: 9140133

## 2023-10-03 ENCOUNTER — Telehealth: Payer: Self-pay

## 2023-10-03 NOTE — Telephone Encounter (Signed)
 Copied from CRM [id number]. Topic: Clinical - Medication Question >> Oct 03, 2023  9:21 AM Franchot Heidelberg wrote: Reason for CRM: Pt has a question about his heart monitor, seeking advice from the clinic.   Best contact: [phone]

## 2023-10-10 ENCOUNTER — Other Ambulatory Visit

## 2023-10-10 DIAGNOSIS — I5032 Chronic diastolic (congestive) heart failure: Secondary | ICD-10-CM

## 2023-10-10 DIAGNOSIS — I482 Chronic atrial fibrillation, unspecified: Secondary | ICD-10-CM | POA: Diagnosis not present

## 2023-10-10 LAB — PT WITH INR/FINGERSTICK
INR, fingerstick: 2.2 ratio — ABNORMAL HIGH
PT, fingerstick: 26.7 s — ABNORMAL HIGH (ref 10.5–13.1)

## 2023-11-07 ENCOUNTER — Other Ambulatory Visit

## 2023-11-07 ENCOUNTER — Telehealth: Payer: Self-pay

## 2023-11-07 DIAGNOSIS — I4819 Other persistent atrial fibrillation: Secondary | ICD-10-CM

## 2023-11-07 LAB — PT WITH INR/FINGERSTICK
INR, fingerstick: 1.6 ratio — ABNORMAL HIGH
PT, fingerstick: 18.7 s — ABNORMAL HIGH (ref 10.5–13.1)

## 2023-11-07 NOTE — Telephone Encounter (Signed)
 Pt came in for PT/INR. Results are 1.6 today. Pt is taking 7.5 mg Monday and Friday, and 5 mg on the other days. Thank you.

## 2023-11-14 NOTE — Progress Notes (Signed)
 Remote pacemaker transmission.

## 2023-11-14 NOTE — Addendum Note (Signed)
 Addended by: Lott Rouleau A on: 11/14/2023 09:20 AM   Modules accepted: Orders

## 2023-12-05 ENCOUNTER — Ambulatory Visit: Payer: Self-pay | Admitting: Family Medicine

## 2023-12-05 ENCOUNTER — Other Ambulatory Visit

## 2023-12-05 DIAGNOSIS — I4819 Other persistent atrial fibrillation: Secondary | ICD-10-CM | POA: Diagnosis not present

## 2023-12-05 LAB — PT WITH INR/FINGERSTICK
INR, fingerstick: 2.1 ratio — ABNORMAL HIGH
PT, fingerstick: 25.4 s — ABNORMAL HIGH (ref 10.5–13.1)

## 2023-12-10 ENCOUNTER — Other Ambulatory Visit: Payer: Self-pay | Admitting: Family Medicine

## 2023-12-10 DIAGNOSIS — I10 Essential (primary) hypertension: Secondary | ICD-10-CM

## 2023-12-20 ENCOUNTER — Ambulatory Visit: Payer: 59

## 2023-12-26 ENCOUNTER — Ambulatory Visit (INDEPENDENT_AMBULATORY_CARE_PROVIDER_SITE_OTHER): Payer: Self-pay

## 2023-12-26 DIAGNOSIS — I442 Atrioventricular block, complete: Secondary | ICD-10-CM

## 2023-12-27 LAB — CUP PACEART REMOTE DEVICE CHECK
Battery Remaining Longevity: 79 mo
Battery Remaining Percentage: 63 %
Battery Voltage: 2.99 V
Brady Statistic RV Percent Paced: 94 %
Date Time Interrogation Session: 20250610020020
Implantable Lead Connection Status: 753985
Implantable Lead Implant Date: 20200824
Implantable Lead Location: 753860
Implantable Pulse Generator Implant Date: 20200824
Lead Channel Impedance Value: 430 Ohm
Lead Channel Pacing Threshold Amplitude: 1 V
Lead Channel Pacing Threshold Pulse Width: 0.5 ms
Lead Channel Sensing Intrinsic Amplitude: 6.8 mV
Lead Channel Setting Pacing Amplitude: 1.25 V
Lead Channel Setting Pacing Pulse Width: 0.5 ms
Lead Channel Setting Sensing Sensitivity: 0.7 mV
Pulse Gen Model: 1272
Pulse Gen Serial Number: 9140133

## 2024-01-01 ENCOUNTER — Ambulatory Visit: Payer: Self-pay | Admitting: Cardiology

## 2024-01-02 ENCOUNTER — Ambulatory Visit: Payer: Self-pay | Admitting: Family Medicine

## 2024-01-02 ENCOUNTER — Other Ambulatory Visit

## 2024-01-02 DIAGNOSIS — I4819 Other persistent atrial fibrillation: Secondary | ICD-10-CM

## 2024-01-02 LAB — PT WITH INR/FINGERSTICK
INR, fingerstick: 2.6 ratio — ABNORMAL HIGH
PT, fingerstick: 30.9 s — ABNORMAL HIGH (ref 10.5–13.1)

## 2024-01-04 ENCOUNTER — Other Ambulatory Visit: Payer: Self-pay | Admitting: Family Medicine

## 2024-01-04 ENCOUNTER — Telehealth: Payer: Self-pay

## 2024-01-04 DIAGNOSIS — I10 Essential (primary) hypertension: Secondary | ICD-10-CM

## 2024-01-04 NOTE — Telephone Encounter (Signed)
 Copied from CRM [id number]. Topic: Clinical - Medication Refill >> Jan 04, 2024 12:48 PM Carla L wrote: Medication: furosemide  (LASIX ) 40 MG tablet  Has the patient contacted their pharmacy? Yes Told to contact the office.   This is the patient's preferred pharmacy:  [id number] [place], [place] - [address] [place] [address] Phone: [phone] Fax: [phone]  Is this the correct pharmacy for this prescription? Yes  Has the prescription been filled recently? Yes  Is the patient out of the medication? No, will run out soon.  Has the patient been seen for an appointment in the last year OR does the patient have an upcoming appointment? Yes  Can we respond through MyChart? No  Agent: Please be advised that Rx refills may take up to 3 business days. We ask that you follow-up with your pharmacy.

## 2024-01-04 NOTE — Telephone Encounter (Signed)
 Copied from CRM [id number]. Topic: Appointments - Scheduling Inquiry for Clinic >> Jan 04, 2024 12:39 PM Hamp Levine R wrote: Reason for CRM: Patient called to get results of his fingerprick and to schedule a new appointment. Relayed the results and then when scheduling asked what he needed to be seen for patient got upset and the call disconnected. Called him back and he said he meant to hang up on me, I told him I needed to ask him some questions and have him stay on the line to schedule. He said fine so I asked the decision tree questions and made him even more upset says no one ever asks him them. Hung up on me again saying to forget about it. If you could please contact patient to further assist.  Can be reached at [phone] >> Jan 04, 2024 12:48 PM Ethelle Herb L wrote: Pt daughter called in and scheduled for pt, pt currently scheduled 02/06/24 at 8am. Pt's daughter requesting orders be placed for upcoming visit.

## 2024-01-08 MED ORDER — FUROSEMIDE 40 MG PO TABS: 40.0000 mg | ORAL_TABLET | Freq: Every day | ORAL | 0 refills | Status: AC

## 2024-01-08 NOTE — Telephone Encounter (Signed)
 Requested Prescriptions  Pending Prescriptions Disp Refills   furosemide  (LASIX ) 40 MG tablet 100 tablet 0    Sig: Take 1 tablet (40 mg total) by mouth daily.     Cardiovascular:  Diuretics - Loop Failed - 01/08/2024 11:39 AM      Failed - Mg Level in normal range and within 180 days    Magnesium   Date Value Ref Range Status  03/10/2019 2.5 (H) 1.7 - 2.4 mg/dL Final    Comment:    Performed at [facility], [address] [place], [place] [address]         Passed - K in normal range and within 180 days    Potassium  Date Value Ref Range Status  09/12/2023 4.0 3.5 - 5.3 mmol/L Final         Passed - Ca in normal range and within 180 days    Calcium  Date Value Ref Range Status  09/12/2023 9.7 8.6 - 10.3 mg/dL Final   Calcium, Ion  Date Value Ref Range Status  02/24/2021 1.25 1.15 - 1.40 mmol/L Final  02/24/2021 1.23 1.15 - 1.40 mmol/L Final         Passed - Na in normal range and within 180 days    Sodium  Date Value Ref Range Status  09/12/2023 138 135 - 146 mmol/L Final  12/17/2021 140 134 - 144 mmol/L Final         Passed - Cr in normal range and within 180 days    Creat  Date Value Ref Range Status  09/12/2023 1.06 0.70 - 1.22 mg/dL Final         Passed - Cl in normal range and within 180 days    Chloride  Date Value Ref Range Status  09/12/2023 101 98 - 110 mmol/L Final         Passed - Last BP in normal range    BP Readings from Last 1 Encounters:  05/30/23 134/70         Passed - Valid encounter within last 6 months    Recent Outpatient Visits           9 months ago Atrial fibrillation, persistent (HCC)   [facility] Duanne Butler DASEN, MD   1 year ago Chronic systolic congestive heart failure [facility])   [place] [facility] Duanne Butler DASEN, MD   1 year ago Atrial fibrillation, persistent [facility])   [place] [facility] Pickard, Butler DASEN, MD   1 year ago Chronic diastolic  heart failure [facility])   [place] [facility] Pickard, Butler DASEN, MD

## 2024-02-06 ENCOUNTER — Other Ambulatory Visit

## 2024-02-06 DIAGNOSIS — I4819 Other persistent atrial fibrillation: Secondary | ICD-10-CM | POA: Diagnosis not present

## 2024-02-06 LAB — PT WITH INR/FINGERSTICK
INR, fingerstick: 2.7 ratio — ABNORMAL HIGH
PT, fingerstick: 32.5 s — ABNORMAL HIGH (ref 10.5–13.1)

## 2024-02-07 ENCOUNTER — Ambulatory Visit: Payer: Self-pay | Admitting: Family Medicine

## 2024-02-26 ENCOUNTER — Other Ambulatory Visit: Payer: Self-pay | Admitting: Family Medicine

## 2024-02-26 DIAGNOSIS — I10 Essential (primary) hypertension: Secondary | ICD-10-CM

## 2024-02-26 DIAGNOSIS — I4819 Other persistent atrial fibrillation: Secondary | ICD-10-CM

## 2024-02-27 NOTE — Progress Notes (Signed)
 Remote pacemaker transmission.

## 2024-02-27 NOTE — Addendum Note (Signed)
 Addended by: VICCI SELLER A on: 02/27/2024 03:21 PM   Modules accepted: Orders

## 2024-03-08 ENCOUNTER — Other Ambulatory Visit: Payer: Self-pay

## 2024-03-08 ENCOUNTER — Telehealth: Payer: Self-pay

## 2024-03-08 NOTE — Telephone Encounter (Signed)
 Tried to call pt to advised that appointment is needed, since last OV was 03/31/2024. No answer and no VM set up. Mjp,lpn  Copied from CRM #[id number]. Topic: Medical Record Request - Provider/Facility Request >> Mar 08, 2024  1:18 PM Ivette P wrote: Reason for CRM: Pt received a letter in the mail saying he needs to renew his disability placement card. This is the first time the pt has had to renew and would like to know if needs to schedule an appt or can just drop papers off.   Pls follow up with pt

## 2024-03-09 ENCOUNTER — Other Ambulatory Visit: Payer: Self-pay | Admitting: Family Medicine

## 2024-03-09 DIAGNOSIS — I10 Essential (primary) hypertension: Secondary | ICD-10-CM

## 2024-03-11 NOTE — Telephone Encounter (Signed)
 Requested medications are due for refill today.  yes  Requested medications are on the active medications list.  yes  Last refill. 09/19/2023  6 month supply for both  Future visit scheduled.   tomorrow  Notes to clinic.  Please review for refill.    Requested Prescriptions  Pending Prescriptions Disp Refills   carvedilol  (COREG ) 12.5 MG tablet [Pharmacy Med Name: CARVEDILOL  12.5 MG TABLET] 180 tablet 1    Sig: TAKE 1 TABLET BY MOUTH TWICE A DAY     Cardiovascular: Beta Blockers 3 Failed - 03/11/2024  2:56 PM      Failed - Valid encounter within last 6 months    Recent Outpatient Visits           11 months ago Atrial fibrillation, persistent (HCC)   [place] [facility] Pickard, Butler DASEN, MD   1 year ago Chronic systolic congestive heart failure [facility])   [facility] Duanne Butler DASEN, MD   1 year ago Atrial fibrillation, persistent [facility])   [place] [facility] Duanne Butler DASEN, MD   2 years ago Chronic diastolic heart failure [facility])   [place] [facility] Pickard, Butler DASEN, MD              Passed - Cr in normal range and within 360 days    Creat  Date Value Ref Range Status  09/12/2023 1.06 0.70 - 1.22 mg/dL Final         Passed - AST in normal range and within 360 days    AST  Date Value Ref Range Status  09/12/2023 26 10 - 35 U/L Final         Passed - ALT in normal range and within 360 days    ALT  Date Value Ref Range Status  09/12/2023 17 9 - 46 U/L Final         Passed - Last BP in normal range    BP Readings from Last 1 Encounters:  05/30/23 134/70         Passed - Last Heart Rate in normal range    Pulse Readings from Last 1 Encounters:  05/30/23 74          warfarin (COUMADIN ) 5 MG tablet [Pharmacy Med Name: WARFARIN SODIUM  5 MG TABLET] 90 tablet 1    Sig: TAKE 1 TABLET (5 MG TOTAL) BY MOUTH DAILY.     Hematology:  Anticoagulants - warfarin Failed -  03/11/2024  2:56 PM      Failed - Manual Review: If patient's warfarin is managed by Anti-Coag team, route request to them. If not, route request to the provider.      Failed - INR in normal range and within 30 days    INR, fingerstick  Date Value Ref Range Status  02/06/2024 2.7 (H) ratio Final    Comment:    INRs >2.9 may be falsely elevated in patients receiving either unfractionated Heparin  or Low Molecular Weight Heparin . Follow up testing in a hospital laboratory may be helpful if clinically indicated.  . INR results of > or = 5.0 should be verified using the standard venipuncture procedure. Reference Range                     0.9-1.1 Moderate-intensity Warfarin Therapy 2.0-3.0 Higher-intensity Warfarin Therapy   3.0-4.0  .          Failed - Valid encounter within last 3  months    Recent Outpatient Visits           11 months ago Atrial fibrillation, persistent [facility])   [place] Pickard, Butler DASEN, MD   1 year ago Chronic systolic congestive heart failure [facility])   [place] [facility] Pickard, Butler DASEN, MD   1 year ago Atrial fibrillation, persistent [facility])   [facility] Duanne Butler DASEN, MD   2 years ago Chronic diastolic heart failure [facility])   [place] [facility] Pickard, Butler DASEN, MD              Passed - HCT in normal range and within 360 days    HCT  Date Value Ref Range Status  09/12/2023 42.3 38.5 - 50.0 % Final   Hematocrit  Date Value Ref Range Status  11/15/2021 39.1 37.5 - 51.0 % Final         Passed - Patient is not pregnant

## 2024-03-12 ENCOUNTER — Other Ambulatory Visit

## 2024-03-12 ENCOUNTER — Ambulatory Visit: Payer: Self-pay | Admitting: Family Medicine

## 2024-03-12 DIAGNOSIS — I5032 Chronic diastolic (congestive) heart failure: Secondary | ICD-10-CM | POA: Diagnosis not present

## 2024-03-12 DIAGNOSIS — Z950 Presence of cardiac pacemaker: Secondary | ICD-10-CM

## 2024-03-12 DIAGNOSIS — K922 Gastrointestinal hemorrhage, unspecified: Secondary | ICD-10-CM

## 2024-03-12 DIAGNOSIS — I482 Chronic atrial fibrillation, unspecified: Secondary | ICD-10-CM

## 2024-03-12 LAB — PT WITH INR/FINGERSTICK
INR, fingerstick: 2.1 ratio — ABNORMAL HIGH
PT, fingerstick: 24.8 s — ABNORMAL HIGH (ref 10.5–13.1)

## 2024-03-15 ENCOUNTER — Encounter: Payer: Self-pay | Admitting: Family Medicine

## 2024-03-15 ENCOUNTER — Ambulatory Visit: Admitting: Family Medicine

## 2024-03-15 VITALS — BP 138/72 | HR 73 | Temp 97.6°F | Ht 68.0 in | Wt 199.6 lb

## 2024-03-15 DIAGNOSIS — R413 Other amnesia: Secondary | ICD-10-CM | POA: Diagnosis not present

## 2024-03-15 DIAGNOSIS — R350 Frequency of micturition: Secondary | ICD-10-CM

## 2024-03-15 LAB — CBC WITH DIFFERENTIAL/PLATELET
Absolute Lymphocytes: 1320 {cells}/uL (ref 850–3900)
Absolute Monocytes: 488 {cells}/uL (ref 200–950)
Basophils Absolute: 52 {cells}/uL (ref 0–200)
Basophils Relative: 0.8 %
Eosinophils Absolute: 59 {cells}/uL (ref 15–500)
Eosinophils Relative: 0.9 %
HCT: 42.3 % (ref 38.5–50.0)
Hemoglobin: 14.1 g/dL (ref 13.2–17.1)
MCH: 30.1 pg (ref 27.0–33.0)
MCHC: 33.3 g/dL (ref 32.0–36.0)
MCV: 90.2 fL (ref 80.0–100.0)
MPV: 11.6 fL (ref 7.5–12.5)
Monocytes Relative: 7.5 %
Neutro Abs: 4583 {cells}/uL (ref 1500–7800)
Neutrophils Relative %: 70.5 %
Platelets: 147 Thousand/uL (ref 140–400)
RBC: 4.69 Million/uL (ref 4.20–5.80)
RDW: 13.2 % (ref 11.0–15.0)
Total Lymphocyte: 20.3 %
WBC: 6.5 Thousand/uL (ref 3.8–10.8)

## 2024-03-15 LAB — MICROSCOPIC MESSAGE

## 2024-03-15 LAB — COMPREHENSIVE METABOLIC PANEL WITH GFR
AG Ratio: 1.7 (calc) (ref 1.0–2.5)
ALT: 22 U/L (ref 9–46)
AST: 30 U/L (ref 10–35)
Albumin: 4.2 g/dL (ref 3.6–5.1)
Alkaline phosphatase (APISO): 69 U/L (ref 35–144)
BUN: 16 mg/dL (ref 7–25)
CO2: 27 mmol/L (ref 20–32)
Calcium: 9.7 mg/dL (ref 8.6–10.3)
Chloride: 101 mmol/L (ref 98–110)
Creat: 0.98 mg/dL (ref 0.70–1.22)
Globulin: 2.5 g/dL (ref 1.9–3.7)
Glucose, Bld: 121 mg/dL — ABNORMAL HIGH (ref 65–99)
Potassium: 4.5 mmol/L (ref 3.5–5.3)
Sodium: 137 mmol/L (ref 135–146)
Total Bilirubin: 1.3 mg/dL — ABNORMAL HIGH (ref 0.2–1.2)
Total Protein: 6.7 g/dL (ref 6.1–8.1)
eGFR: 73 mL/min/1.73m2 (ref >=60)

## 2024-03-15 LAB — URINALYSIS, ROUTINE W REFLEX MICROSCOPIC
Bacteria, UA: NONE SEEN /HPF
Bilirubin Urine: NEGATIVE
Glucose, UA: NEGATIVE
Leukocytes,Ua: NEGATIVE
Nitrite: NEGATIVE
Specific Gravity, Urine: 1.015 (ref 1.001–1.035)
pH: 6.5 (ref 5.0–8.0)

## 2024-03-15 LAB — TSH: TSH: 2.91 m[IU]/L (ref 0.40–4.50)

## 2024-03-15 LAB — VITAMIN B12: Vitamin B-12: 931 pg/mL (ref 200–1100)

## 2024-03-15 NOTE — Progress Notes (Signed)
 Subjective:    Patient ID: Austin FORBES Valora Mickey., male    DOB: [birth year], 89 y.o.   MRN: [id number]  HPI Patient is here today with his granddaughter.  She is concerned that he is demonstrating signs of dementia.  Patient is having hallucinations.  He states that he sees baby chickens on the floor.  He is concerned that his granddaughter is poisoning him when she cooks for him.  He is concerned that they have listening devices in his room that his telephone is bugged.  However he is happy and pleasant.  He does not seem bothered or scared.  I performed a Mini-Mental status exam today.  Patient is illiterate.  However he is able to remember the date.  He could remember 3 out of 3 objects on recall.  He was able to perform serial sevens and scored 4 out of 5..  However he endorses delusions of being monitored and surveilled as well as poisoned. Past Medical History:  Diagnosis Date   Anxiety    Atrial fibrillation (HCC)    Chronic   Cataracts, bilateral    to have left eye cataract surgery on 03/21/17    CHF (congestive heart failure), NYHA class II, chronic, diastolic (HCC)    Colon polyps    last colonoscopy 2015 (tubular adenoma)   Depression    GERD (gastroesophageal reflux disease)    H/O: GI bleed    Recurrent   Hemorrhoids    History of BPH    History of kidney stones    hx of x 1  passed stones   Hypertension    Pernicious anemia    Presence of permanent cardiac pacemaker    PVC (premature ventricular contraction)    S/P mitral valve clip implantation 03/25/2021   Successful transcatheter edge-to-edge mitral valve repair with placement of a MitraClip G4 XTW clip positioned A2/P2 and placement of a MitraClip G4 NTW clip positioned on the lateral side of A2/P2, reducing mitral regurgitation from 4+ at baseline to 1-2+ post procedure   Severe mitral regurgitation    Vitamin D deficiency    Past Surgical History:  Procedure Laterality Date   COLONOSCOPY WITH PROPOFOL  N/A  03/17/2017   Procedure: COLONOSCOPY WITH PROPOFOL ;  Surgeon: Rollin Dover, MD;  Location: [facility];  Service: Endoscopy;  Laterality: N/A;   EYE SURGERY     INGUINAL HERNIA REPAIR  1958; ~ 2002   left; right   MULTIPLE EXTRACTIONS WITH ALVEOLOPLASTY N/A 03/18/2021   Procedure: MULTIPLE EXTRACTION WITH ALVEOLOPLASTY;  Surgeon: Celena Lum NOVAK, DMD;  Location: [facility] OR;  Service: Dentistry;  Laterality: N/A;   PACEMAKER IMPLANT N/A 03/11/2019   Procedure: PACEMAKER IMPLANT;  Surgeon: Inocencio Soyla Lunger, MD;  Location: [facility] INVASIVE CV LAB;  Service: Cardiovascular;  Laterality: N/A;   RIGHT/LEFT HEART CATH AND CORONARY ANGIOGRAPHY N/A 02/24/2021   Procedure: RIGHT/LEFT HEART CATH AND CORONARY ANGIOGRAPHY;  Surgeon: Wonda Sharper, MD;  Location: [facility] INVASIVE CV LAB;  Service: Cardiovascular;  Laterality: N/A;   TEE WITHOUT CARDIOVERSION N/A 12/23/2020   Procedure: TRANSESOPHAGEAL ECHOCARDIOGRAM (TEE);  Surgeon: Hobart Powell FORBES, MD;  Location: [facility] ENDOSCOPY;  Service: Cardiovascular;  Laterality: N/A;   TEE WITHOUT CARDIOVERSION N/A 03/25/2021   Procedure: TRANSESOPHAGEAL ECHOCARDIOGRAM (TEE);  Surgeon: Wonda Sharper, MD;  Location: [facility] INVASIVE CV LAB;  Service: Cardiovascular;  Laterality: N/A;   TEMPORARY PACEMAKER N/A 03/10/2019   Procedure: TEMPORARY PACEMAKER;  Surgeon: Swazil[place], Peter M, MD;  Location: [facility] INVASIVE CV LAB;  Service: Cardiovascular;  Laterality:  N/A;   TRANSCATHETER MITRAL EDGE TO EDGE REPAIR N/A 03/25/2021   Procedure: MITRAL VALVE REPAIR;  Surgeon: Wonda Sharper, MD;  Location: [facility] INVASIVE CV LAB;  Service: Cardiovascular;  Laterality: N/A;   Current Outpatient Medications on File Prior to Visit  Medication Sig Dispense Refill   amLODipine  (NORVASC ) 5 MG tablet TAKE 1 TABLET (5 MG TOTAL) BY MOUTH DAILY. 90 tablet 1   carvedilol  (COREG ) 12.5 MG tablet TAKE 1 TABLET BY MOUTH TWICE A DAY 180 tablet 1   furosemide  (LASIX ) 40 MG tablet Take 1 tablet (40 mg total) by mouth  daily. 100 tablet 0   losartan  (COZAAR ) 50 MG tablet TAKE 1 TABLET BY MOUTH EVERY DAY 90 tablet 1   spironolactone  (ALDACTONE ) 25 MG tablet TAKE 1 TABLET (25 MG TOTAL) BY MOUTH DAILY. 90 tablet 1   warfarin (COUMADIN ) 5 MG tablet TAKE 1 TABLET (5 MG TOTAL) BY MOUTH DAILY. (Patient taking differently: Take 5 mg by mouth daily. Takes 1.5 pills on M, W  and Friday. Takes 1 pill every other.) 90 tablet 1   No current facility-administered medications on file prior to visit.   No Known Allergies Social History   Socioeconomic History   Marital status: Widowed    Spouse name: Not on file   Number of children: 1   Years of education: Not on file   Highest education level: Not on file  Occupational History   Occupation: Retired  Tobacco Use   Smoking status: Never   Smokeless tobacco: Former    Types: Snuff   Tobacco comments:    03/17/21 still use snuff daily  Vaping Use   Vaping status: Never Used  Substance and Sexual Activity   Alcohol use: No   Drug use: No   Sexual activity: Never    Comment: widower, lives with granddaughter and great grandchildren.  Other Topics Concern   Not on file  Social History Narrative   Lives with grand daughter.         Epworth Sleepiness Scale = 7 (as of 09/21/2015)   Social Drivers of Health   Financial Resource Strain: Low Risk  (07/20/2023)   Overall Financial Resource Strain (CARDIA)    Difficulty of Paying Living Expenses: Not hard at all  Food Insecurity: No Food Insecurity (07/20/2023)   Hunger Vital Sign    Worried About Running Out of Food in the Last Year: Never true    Ran Out of Food in the Last Year: Never true  Transportation Needs: No Transportation Needs (07/20/2023)   PRAPARE - Administrator, Civil Service (Medical): No    Lack of Transportation (Non-Medical): No  Physical Activity: Inactive (07/20/2023)   Exercise Vital Sign    Days of Exercise per Week: 0 days    Minutes of Exercise per Session: 0 min  Stress: No  Stress Concern Present (07/20/2023)   [employer] of Occupational Health - Occupational Stress Questionnaire    Feeling of Stress : Not at all  Social Connections: Socially Isolated (07/20/2023)   Social Connection and Isolation Panel    Frequency of Communication with Friends and Family: More than three times a week    Frequency of Social Gatherings with Friends and Family: Never    Attends Religious Services: Never    Database administrator or Organizations: No    Attends Engineer, structural: Not on file    Marital Status: Widowed  Intimate Partner Violence: Not At Risk (07/20/2023)   Humiliation,  Afraid, Rape, and Kick questionnaire    Fear of Current or Ex-Partner: No    Emotionally Abused: No    Physically Abused: No    Sexually Abused: No      Review of Systems  All other systems reviewed and are negative.      Objective:   Physical Exam Vitals reviewed.  Constitutional:      General: He is not in acute distress.    Appearance: Normal appearance. He is normal weight. He is not ill-appearing, toxic-appearing or diaphoretic.  HENT:     Head: Normocephalic and atraumatic.     Right Ear: Tympanic membrane and ear canal normal.     Left Ear: Tympanic membrane and ear canal normal.     Nose: No congestion or rhinorrhea.     Mouth/Throat:     Mouth: Mucous membranes are moist.     Pharynx: Oropharynx is clear. No oropharyngeal exudate or posterior oropharyngeal erythema.  Neck:     Vascular: No carotid bruit.  Cardiovascular:     Rate and Rhythm: Normal rate. Rhythm irregular.     Heart sounds: Murmur heard.     No friction rub. No gallop.  Pulmonary:     Effort: Pulmonary effort is normal. No respiratory distress.     Breath sounds: Normal breath sounds. No stridor. No wheezing, rhonchi or rales.  Abdominal:     General: Bowel sounds are normal. There is no distension.     Palpations: Abdomen is soft.     Tenderness: There is no abdominal tenderness.  There is no right CVA tenderness, guarding or rebound.  Musculoskeletal:     Cervical back: Neck supple. No rigidity.     Right lower leg: No edema.     Left lower leg: No edema.  Lymphadenopathy:     Cervical: No cervical adenopathy.  Skin:    Findings: No rash.  Neurological:     General: No focal deficit present.     Mental Status: He is alert and oriented to person, place, and time. Mental status is at baseline.     Cranial Nerves: No cranial nerve deficit.     Sensory: No sensory deficit.     Motor: No weakness.     Coordination: Coordination normal.     Gait: Gait normal.     Deep Tendon Reflexes: Reflexes normal.  Psychiatric:        Mood and Affect: Mood normal.        Behavior: Behavior normal.        Thought Content: Thought content normal.        Judgment: Judgment normal.           Assessment & Plan:  Memory loss - Plan: CBC with Differential/Platelet, Comprehensive metabolic panel with GFR, Vitamin B12, Urinalysis, Routine w reflex microscopic, TSH First look for reversible causes.  I will check a CBC CMP B12 a urinalysis and a TSH.  The remainder of his exam is normal.  If all the above workup is normal, consider adding a low-dose Seroquel coupled with Aricept for possible early dementia with paranoid delusions

## 2024-03-19 ENCOUNTER — Ambulatory Visit: Payer: Self-pay | Admitting: Family Medicine

## 2024-03-20 ENCOUNTER — Other Ambulatory Visit: Payer: Self-pay

## 2024-03-20 ENCOUNTER — Other Ambulatory Visit: Payer: Self-pay | Admitting: Family Medicine

## 2024-03-20 ENCOUNTER — Telehealth: Payer: Self-pay

## 2024-03-20 DIAGNOSIS — F22 Delusional disorders: Secondary | ICD-10-CM

## 2024-03-20 DIAGNOSIS — R443 Hallucinations, unspecified: Secondary | ICD-10-CM

## 2024-03-20 DIAGNOSIS — I10 Essential (primary) hypertension: Secondary | ICD-10-CM

## 2024-03-20 MED ORDER — QUETIAPINE FUMARATE ER 50 MG PO TB24: 50.0000 mg | ORAL_TABLET | Freq: Every day | ORAL | Status: DC

## 2024-03-20 MED ORDER — DONEPEZIL HCL 5 MG PO TABS
5.0000 mg | ORAL_TABLET | Freq: Every day | ORAL | 1 refills | Status: AC
Start: 2024-03-20 — End: ?

## 2024-03-20 NOTE — Telephone Encounter (Signed)
 Copied from CRM [id number]. Topic: Clinical - Lab/Test Results >> Mar 20, 2024  3:05 PM Pinkey ORN wrote: Reason for CRM: Attention Sheena - Lab Results >> Mar 20, 2024  3:05 PM Pinkey ORN wrote: Returning Missed Call From Corinna Jesusa SAUNDERS, [place].  Edsel [phone]

## 2024-03-21 ENCOUNTER — Other Ambulatory Visit: Payer: Self-pay

## 2024-03-21 ENCOUNTER — Telehealth: Payer: Self-pay

## 2024-03-21 DIAGNOSIS — R791 Abnormal coagulation profile: Secondary | ICD-10-CM

## 2024-03-21 NOTE — Telephone Encounter (Signed)
 Informed pt daughter that pt should take the medication at bedtime per prescription details.

## 2024-03-21 NOTE — Telephone Encounter (Signed)
 OV 03/15/24- acute Losartan   12/13/23 #90 1RF- too soon Furosamide 01/08/24 #100- too soon Requested Prescriptions  Pending Prescriptions Disp Refills   losartan  (COZAAR ) 50 MG tablet [Pharmacy Med Name: LOSARTAN  POTASSIUM 50 MG TAB] 90 tablet 1    Sig: TAKE 1 TABLET BY MOUTH EVERY DAY     Cardiovascular:  Angiotensin Receptor Blockers Passed - 03/21/2024  1:11 PM      Passed - Cr in normal range and within 180 days    Creat  Date Value Ref Range Status  03/15/2024 0.98 0.70 - 1.22 mg/dL Final         Passed - K in normal range and within 180 days    Potassium  Date Value Ref Range Status  03/15/2024 4.5 3.5 - 5.3 mmol/L Final         Passed - Patient is not pregnant      Passed - Last BP in normal range    BP Readings from Last 1 Encounters:  03/15/24 138/72         Passed - Valid encounter within last 6 months    Recent Outpatient Visits           6 days ago Memory loss   [facility] Pickard, Butler DASEN, MD   11 months ago Atrial fibrillation, persistent [facility])   [facility] Duanne Butler DASEN, MD   1 year ago Chronic systolic congestive heart failure [facility])   [facility] Pickard, Butler DASEN, MD   1 year ago Atrial fibrillation, persistent [facility])   [facility] Duanne Butler DASEN, MD   2 years ago Chronic diastolic heart failure [facility])   [facility] Pickard, Butler DASEN, MD               furosemide  (LASIX ) 40 MG tablet [Pharmacy Med Name: FUROSEMIDE  40 MG TABLET] 100 tablet 0    Sig: TAKE 1 TABLET BY MOUTH EVERY DAY     Cardiovascular:  Diuretics - Loop Failed - 03/21/2024  1:11 PM      Failed - Mg Level in normal range and within 180 days    Magnesium   Date Value Ref Range Status  03/10/2019 2.5 (H) 1.7 - 2.4 mg/dL Final    Comment:    Performed at [facility], [address] [place], [place] [address]         Passed - K  in normal range and within 180 days    Potassium  Date Value Ref Range Status  03/15/2024 4.5 3.5 - 5.3 mmol/L Final         Passed - Ca in normal range and within 180 days    Calcium  Date Value Ref Range Status  03/15/2024 9.7 8.6 - 10.3 mg/dL Final   Calcium, Ion  Date Value Ref Range Status  02/24/2021 1.25 1.15 - 1.40 mmol/L Final  02/24/2021 1.23 1.15 - 1.40 mmol/L Final         Passed - Na in normal range and within 180 days    Sodium  Date Value Ref Range Status  03/15/2024 137 135 - 146 mmol/L Final  12/17/2021 140 134 - 144 mmol/L Final         Passed - Cr in normal range and within 180 days    Creat  Date Value Ref Range Status  03/15/2024 0.98 0.70 - 1.22 mg/dL Final  Passed - Cl in normal range and within 180 days    Chloride  Date Value Ref Range Status  03/15/2024 101 98 - 110 mmol/L Final         Passed - Last BP in normal range    BP Readings from Last 1 Encounters:  03/15/24 138/72         Passed - Valid encounter within last 6 months    Recent Outpatient Visits           6 days ago Memory loss   [facility] Pickard, Butler DASEN, MD   11 months ago Atrial fibrillation, persistent [facility])   [facility] Duanne Butler DASEN, MD   1 year ago Chronic systolic congestive heart failure [facility])   [facility] Duanne Butler DASEN, MD   1 year ago Atrial fibrillation, persistent [facility])   [place] [facility] Duanne Butler DASEN, MD   2 years ago Chronic diastolic heart failure [facility])   [place] [facility] Pickard, Butler DASEN, MD

## 2024-03-21 NOTE — Telephone Encounter (Signed)
 Copied from CRM #[id number]. Topic: Clinical - Medication Question >> Mar 21, 2024  8:17 AM Rosaria BRAVO wrote: Reason for CRM: Pt's daughter called with questions regarding his donazepil, wants to know when it is safe to take this along with his other medications.   Best contact: [phone]

## 2024-03-25 ENCOUNTER — Telehealth: Payer: Self-pay

## 2024-03-25 NOTE — Telephone Encounter (Signed)
 Copied from CRM [id number]. Topic: Clinical - Medication Question >> Mar 25, 2024 11:39 AM Rea ORN wrote: Reason for CRM: Pt daughter Edsel called to advise new med donepezil  (ARICEPT ) 5 MG tablet was ordered 03/20/24. Edsel stated she thinks pt BP is fluctuating on it but is not sure if their BP cuff is accurate. Edsel was unable to provide a list of BP's (because she wasn't at home), but said Systolic was barely over 100 and Diastolic was close to 80.  Edsel said the pt was called about the medication but due to memory issues, he was unable to recall what was said during the phone call. Danielle asking for the nurse to call her regarding the medication. Please call [phone]

## 2024-03-26 ENCOUNTER — Ambulatory Visit (INDEPENDENT_AMBULATORY_CARE_PROVIDER_SITE_OTHER): Payer: Self-pay

## 2024-03-26 DIAGNOSIS — I442 Atrioventricular block, complete: Secondary | ICD-10-CM | POA: Diagnosis not present

## 2024-03-27 ENCOUNTER — Ambulatory Visit: Payer: Self-pay | Admitting: Cardiology

## 2024-03-27 LAB — CUP PACEART REMOTE DEVICE CHECK
Battery Remaining Longevity: 77 mo
Battery Remaining Percentage: 61 %
Battery Voltage: 2.99 V
Brady Statistic RV Percent Paced: 94 %
Date Time Interrogation Session: 20250909020017
Implantable Lead Connection Status: 753985
Implantable Lead Implant Date: 20200824
Implantable Lead Location: 753860
Implantable Pulse Generator Implant Date: 20200824
Lead Channel Impedance Value: 400 Ohm
Lead Channel Pacing Threshold Amplitude: 0.875 V
Lead Channel Pacing Threshold Pulse Width: 0.5 ms
Lead Channel Sensing Intrinsic Amplitude: 7.6 mV
Lead Channel Setting Pacing Amplitude: 1.125
Lead Channel Setting Pacing Pulse Width: 0.5 ms
Lead Channel Setting Sensing Sensitivity: 0.7 mV
Pulse Gen Model: 1272
Pulse Gen Serial Number: 9140133

## 2024-04-02 ENCOUNTER — Other Ambulatory Visit

## 2024-04-02 DIAGNOSIS — R791 Abnormal coagulation profile: Secondary | ICD-10-CM

## 2024-04-04 NOTE — Progress Notes (Signed)
 Remote PPM Transmission

## 2024-04-16 ENCOUNTER — Ambulatory Visit: Admitting: Family Medicine

## 2024-04-16 ENCOUNTER — Other Ambulatory Visit

## 2024-04-16 ENCOUNTER — Encounter: Payer: Self-pay | Admitting: Family Medicine

## 2024-04-16 VITALS — BP 142/78 | HR 67 | Temp 98.2°F | Ht 68.0 in | Wt 197.2 lb

## 2024-04-16 DIAGNOSIS — Z23 Encounter for immunization: Secondary | ICD-10-CM

## 2024-04-16 DIAGNOSIS — R413 Other amnesia: Secondary | ICD-10-CM

## 2024-04-16 DIAGNOSIS — I4819 Other persistent atrial fibrillation: Secondary | ICD-10-CM

## 2024-04-16 DIAGNOSIS — F22 Delusional disorders: Secondary | ICD-10-CM

## 2024-04-16 DIAGNOSIS — R791 Abnormal coagulation profile: Secondary | ICD-10-CM | POA: Diagnosis not present

## 2024-04-16 LAB — PT WITH INR/FINGERSTICK
INR, fingerstick: 2.7 ratio — ABNORMAL HIGH
PT, fingerstick: 31.9 s — ABNORMAL HIGH (ref 10.5–13.1)

## 2024-04-16 MED ORDER — QUETIAPINE FUMARATE 25 MG PO TABS: 25.0000 mg | ORAL_TABLET | Freq: Every day | ORAL | 3 refills | Status: DC

## 2024-04-16 NOTE — Progress Notes (Signed)
 Subjective:    Patient ID: Austin FORBES Valora Mickey., male    DOB: 05-06-1932, 89 y.o.   MRN: [id number]  03/15/24 Patient is here today with his granddaughter.  She is concerned that he is demonstrating signs of dementia.  Patient is having hallucinations.  He states that he sees baby chickens on the floor.  He is concerned that his granddaughter is poisoning him when she cooks for him.  He is concerned that they have listening devices in his room that his telephone is bugged.  However he is happy and pleasant.  He does not seem bothered or scared.  I performed a Mini-Mental status exam today.  Patient is illiterate.  However he is able to remember the date.  He could remember 3 out of 3 objects on recall.  He was able to perform serial sevens and scored 4 out of 5..  However he endorses delusions of being monitored and surveilled as well as poisoned.  At that time, my plan was: First look for reversible causes.  I will check a CBC CMP B12 a urinalysis and a TSH.  The remainder of his exam is normal.  If all the above workup is normal, consider adding a low-dose Seroquel  coupled with Aricept  for possible early dementia with paranoid delusions  04/16/24  Patient is currently taking Aricept  5 mg daily.  He continues to have delusions of persecution.  He believes that his family is talking about him behind his back.  He is hearing things that they do not hear.  This is very upsetting to him.  He is also having a difficult time sleeping at night.  His INR today is 2.7. Past Medical History:  Diagnosis Date   Anxiety    Atrial fibrillation (HCC)    Chronic   Cataracts, bilateral    to have left eye cataract surgery on 03/21/17    CHF (congestive heart failure), NYHA class II, chronic, diastolic (HCC)    Colon polyps    last colonoscopy 2015 (tubular adenoma)   Depression    GERD (gastroesophageal reflux disease)    H/O: GI bleed    Recurrent   Hemorrhoids    History of BPH    History of kidney stones     hx of x 1  passed stones   Hypertension    Pernicious anemia    Presence of permanent cardiac pacemaker    PVC (premature ventricular contraction)    S/P mitral valve clip implantation 03/25/2021   Successful transcatheter edge-to-edge mitral valve repair with placement of a MitraClip G4 XTW clip positioned A2/P2 and placement of a MitraClip G4 NTW clip positioned on the lateral side of A2/P2, reducing mitral regurgitation from 4+ at baseline to 1-2+ post procedure   Severe mitral regurgitation    Vitamin D deficiency    Past Surgical History:  Procedure Laterality Date   COLONOSCOPY WITH PROPOFOL  N/A 03/17/2017   Procedure: COLONOSCOPY WITH PROPOFOL ;  Surgeon: Rollin Dover, MD;  Location: [facility];  Service: Endoscopy;  Laterality: N/A;   EYE SURGERY     INGUINAL HERNIA REPAIR  1958; ~ 2002   left; right   MULTIPLE EXTRACTIONS WITH ALVEOLOPLASTY N/A 03/18/2021   Procedure: MULTIPLE EXTRACTION WITH ALVEOLOPLASTY;  Surgeon: Celena Lum NOVAK, DMD;  Location: [facility];  Service: Dentistry;  Laterality: N/A;   PACEMAKER IMPLANT N/A 03/11/2019   Procedure: PACEMAKER IMPLANT;  Surgeon: Inocencio Soyla Lunger, MD;  Location: [facility] INVASIVE CV LAB;  Service: Cardiovascular;  Laterality: N/A;  RIGHT/LEFT HEART CATH AND CORONARY ANGIOGRAPHY N/A 02/24/2021   Procedure: RIGHT/LEFT HEART CATH AND CORONARY ANGIOGRAPHY;  Surgeon: Wonda Sharper, MD;  Location: [facility] INVASIVE CV LAB;  Service: Cardiovascular;  Laterality: N/A;   TEE WITHOUT CARDIOVERSION N/A 12/23/2020   Procedure: TRANSESOPHAGEAL ECHOCARDIOGRAM (TEE);  Surgeon: Hobart Powell BRAVO, MD;  Location: [facility] ENDOSCOPY;  Service: Cardiovascular;  Laterality: N/A;   TEE WITHOUT CARDIOVERSION N/A 03/25/2021   Procedure: TRANSESOPHAGEAL ECHOCARDIOGRAM (TEE);  Surgeon: Wonda Sharper, MD;  Location: [facility] INVASIVE CV LAB;  Service: Cardiovascular;  Laterality: N/A;   TEMPORARY PACEMAKER N/A 03/10/2019   Procedure: TEMPORARY PACEMAKER;  Surgeon: Swazil[place],  Peter M, MD;  Location: [facility] INVASIVE CV LAB;  Service: Cardiovascular;  Laterality: N/A;   TRANSCATHETER MITRAL EDGE TO EDGE REPAIR N/A 03/25/2021   Procedure: MITRAL VALVE REPAIR;  Surgeon: Wonda Sharper, MD;  Location: [facility] INVASIVE CV LAB;  Service: Cardiovascular;  Laterality: N/A;   Current Outpatient Medications on File Prior to Visit  Medication Sig Dispense Refill   amLODipine  (NORVASC ) 5 MG tablet TAKE 1 TABLET (5 MG TOTAL) BY MOUTH DAILY. 90 tablet 1   carvedilol  (COREG ) 12.5 MG tablet TAKE 1 TABLET BY MOUTH TWICE A DAY 180 tablet 1   donepezil  (ARICEPT ) 5 MG tablet Take 1 tablet (5 mg total) by mouth at bedtime. 90 tablet 1   furosemide  (LASIX ) 40 MG tablet Take 1 tablet (40 mg total) by mouth daily. 100 tablet 0   losartan  (COZAAR ) 50 MG tablet TAKE 1 TABLET BY MOUTH EVERY DAY 90 tablet 1   spironolactone  (ALDACTONE ) 25 MG tablet TAKE 1 TABLET (25 MG TOTAL) BY MOUTH DAILY. 90 tablet 1   warfarin (COUMADIN ) 5 MG tablet TAKE 1 TABLET (5 MG TOTAL) BY MOUTH DAILY. (Patient taking differently: Take 5 mg by mouth daily. Takes 1.5 pills on M, W  and Friday. Takes 1 pill every other.) 90 tablet 1   Current Facility-Administered Medications on File Prior to Visit  Medication Dose Route Frequency Provider Last Rate Last Admin   QUEtiapine  (SEROQUEL  XR) 24 hr tablet 50 mg  50 mg Oral Daily Duanne Butler DASEN, MD       No Known Allergies Social History   Socioeconomic History   Marital status: Widowed    Spouse name: Not on file   Number of children: 1   Years of education: Not on file   Highest education level: Not on file  Occupational History   Occupation: Retired  Tobacco Use   Smoking status: Never   Smokeless tobacco: Former    Types: Snuff   Tobacco comments:    03/17/21 still use snuff daily  Vaping Use   Vaping status: Never Used  Substance and Sexual Activity   Alcohol use: No   Drug use: No   Sexual activity: Never    Comment: widower, lives with granddaughter and  great grandchildren.  Other Topics Concern   Not on file  Social History Narrative   Lives with grand daughter.         Epworth Sleepiness Scale = 7 (as of 09/21/2015)   Social Drivers of Health   Financial Resource Strain: Low Risk  (07/20/2023)   Overall Financial Resource Strain (CARDIA)    Difficulty of Paying Living Expenses: Not hard at all  Food Insecurity: No Food Insecurity (07/20/2023)   Hunger Vital Sign    Worried About Running Out of Food in the Last Year: Never true    Ran Out of Food in the Last Year: Never  true  Transportation Needs: No Transportation Needs (07/20/2023)   PRAPARE - Administrator, Civil Service (Medical): No    Lack of Transportation (Non-Medical): No  Physical Activity: Inactive (07/20/2023)   Exercise Vital Sign    Days of Exercise per Week: 0 days    Minutes of Exercise per Session: 0 min  Stress: No Stress Concern Present (07/20/2023)   [employer] of Occupational Health - Occupational Stress Questionnaire    Feeling of Stress : Not at all  Social Connections: Socially Isolated (07/20/2023)   Social Connection and Isolation Panel    Frequency of Communication with Friends and Family: More than three times a week    Frequency of Social Gatherings with Friends and Family: Never    Attends Religious Services: Never    Database administrator or Organizations: No    Attends Engineer, structural: Not on file    Marital Status: Widowed  Intimate Partner Violence: Not At Risk (07/20/2023)   Humiliation, Afraid, Rape, and Kick questionnaire    Fear of Current or Ex-Partner: No    Emotionally Abused: No    Physically Abused: No    Sexually Abused: No      Review of Systems  All other systems reviewed and are negative.      Objective:   Physical Exam Vitals reviewed.  Constitutional:      General: He is not in acute distress.    Appearance: Normal appearance. He is normal weight. He is not ill-appearing, toxic-appearing or  diaphoretic.  HENT:     Head: Normocephalic and atraumatic.     Right Ear: Tympanic membrane and ear canal normal.     Left Ear: Tympanic membrane and ear canal normal.     Nose: No congestion or rhinorrhea.     Mouth/Throat:     Mouth: Mucous membranes are moist.     Pharynx: Oropharynx is clear. No oropharyngeal exudate or posterior oropharyngeal erythema.  Neck:     Vascular: No carotid bruit.  Cardiovascular:     Rate and Rhythm: Normal rate. Rhythm irregular.     Heart sounds: Murmur heard.     No friction rub. No gallop.  Pulmonary:     Effort: Pulmonary effort is normal. No respiratory distress.     Breath sounds: Normal breath sounds. No stridor. No wheezing, rhonchi or rales.  Abdominal:     General: Bowel sounds are normal. There is no distension.     Palpations: Abdomen is soft.     Tenderness: There is no abdominal tenderness. There is no right CVA tenderness, guarding or rebound.  Musculoskeletal:     Cervical back: Neck supple. No rigidity.     Right lower leg: No edema.     Left lower leg: No edema.  Lymphadenopathy:     Cervical: No cervical adenopathy.  Skin:    Findings: No rash.  Neurological:     General: No focal deficit present.     Mental Status: He is alert and oriented to person, place, and time. Mental status is at baseline.     Cranial Nerves: No cranial nerve deficit.     Sensory: No sensory deficit.     Motor: No weakness.     Coordination: Coordination normal.     Gait: Gait normal.     Deep Tendon Reflexes: Reflexes normal.  Psychiatric:        Mood and Affect: Mood normal.        Behavior: Behavior  normal.        Thought Content: Thought content normal.        Judgment: Judgment normal.     Family indicates that the delusions and confusion is are worse at night      Assessment & Plan:  I plan to start the patient on Seroquel  25 mg p.o. nightly for possible sundowning.  Continue Aricept  5 mg daily.  Reassess in 1 month.  INR is  therapeutic.  Continue current dose of Coumadin  and recheck in 1 month.

## 2024-05-02 ENCOUNTER — Telehealth: Payer: Self-pay

## 2024-05-02 NOTE — Telephone Encounter (Signed)
 Copied from CRM #[id number]. Topic: Clinical - Prescription Issue >> May 02, 2024 10:54 AM Charlet HERO wrote: Reason for CRM: Patient is calling about the instant relase being for QUEtiapine  (SEROQUEL ) 25 MG tablet to be changed to the time released, now he is having the episodes through out the day instead of at night.

## 2024-05-03 ENCOUNTER — Other Ambulatory Visit: Payer: Self-pay

## 2024-05-03 MED ORDER — QUETIAPINE FUMARATE ER 50 MG PO TB24: 50.0000 mg | ORAL_TABLET | Freq: Every day | ORAL | 1 refills | Status: DC

## 2024-05-03 NOTE — Telephone Encounter (Signed)
 Sent in medication. Pt informed and verbalized understanding.

## 2024-05-10 ENCOUNTER — Encounter: Payer: Self-pay | Admitting: Family Medicine

## 2024-05-10 ENCOUNTER — Ambulatory Visit (INDEPENDENT_AMBULATORY_CARE_PROVIDER_SITE_OTHER): Admitting: Family Medicine

## 2024-05-10 VITALS — BP 132/82 | HR 72 | Ht 68.0 in

## 2024-05-10 DIAGNOSIS — I4819 Other persistent atrial fibrillation: Secondary | ICD-10-CM

## 2024-05-10 DIAGNOSIS — R791 Abnormal coagulation profile: Secondary | ICD-10-CM | POA: Diagnosis not present

## 2024-05-10 DIAGNOSIS — F03B2 Unspecified dementia, moderate, with psychotic disturbance: Secondary | ICD-10-CM | POA: Diagnosis not present

## 2024-05-10 LAB — PT WITH INR/FINGERSTICK
INR, fingerstick: 2.6 ratio — ABNORMAL HIGH
PT, fingerstick: 30.8 s — ABNORMAL HIGH (ref 10.5–13.1)

## 2024-05-10 NOTE — Progress Notes (Signed)
 Subjective:    Patient ID: Austin Gray Valora Mickey., male    DOB: Dec 29, 1931, 89 y.o.   MRN: [id number]  03/15/24 Patient is here today with his granddaughter.  She is concerned that he is demonstrating signs of dementia.  Patient is having hallucinations.  He states that he sees baby chickens on the floor.  He is concerned that his granddaughter is poisoning him when she cooks for him.  He is concerned that they have listening devices in his room that his telephone is bugged.  However he is happy and pleasant.  He does not seem bothered or scared.  I performed a Mini-Mental status exam today.  Patient is illiterate.  However he is able to remember the date.  He could remember 3 out of 3 objects on recall.  He was able to perform serial sevens and scored 4 out of 5..  However he endorses delusions of being monitored and surveilled as well as poisoned.  At that time, my plan was: First look for reversible causes.  I will check a CBC CMP B12 a urinalysis and a TSH.  The remainder of his exam is normal.  If all the above workup is normal, consider adding a low-dose Seroquel  coupled with Aricept  for possible early dementia with paranoid delusions   04/16/24 Patient is currently taking Aricept  5 mg daily.  He continues to have delusions of persecution.  He believes that his family is talking about him behind his back.  He is hearing things that they do not hear.  This is very upsetting to him.  He is also having a difficult time sleeping at night.  His INR today is 2.7.  At that time, my plan was: I plan to start the patient on Seroquel  25 mg p.o. nightly for possible sundowning.  Continue Aricept  5 mg daily.  Reassess in 1 month.  INR is therapeutic.  Continue current dose of Coumadin  and recheck in 1 month.   05/10/24 Recently switched to Seroquel  extended release 50 mg p.o. nightly due to worsening delusions and paranoia during the day.  Unfortunately, patient is having increasing paranoia.  He feels that his  granddaughter's boyfriend is trying to kill him and poisoned him.  He believes that he has a new boyfriend although she has been dating him for 20 years.  He also believes that they are listening to him through the television.  He is very agitated and restless.  He feels that the boyfriend is one of her the daughter or the children that live with them.  This is a unfounded concern. Past Medical History:  Diagnosis Date   Anxiety    Atrial fibrillation (HCC)    Chronic   Cataracts, bilateral    to have left eye cataract surgery on 03/21/17    CHF (congestive heart failure), NYHA class II, chronic, diastolic (HCC)    Colon polyps    last colonoscopy 2015 (tubular adenoma)   Depression    GERD (gastroesophageal reflux disease)    H/O: GI bleed    Recurrent   Hemorrhoids    History of BPH    History of kidney stones    hx of x 1  passed stones   Hypertension    Pernicious anemia    Presence of permanent cardiac pacemaker    PVC (premature ventricular contraction)    S/P mitral valve clip implantation 03/25/2021   Successful transcatheter edge-to-edge mitral valve repair with placement of a MitraClip G4 XTW clip positioned A2/P2  and placement of a MitraClip G4 NTW clip positioned on the lateral side of A2/P2, reducing mitral regurgitation from 4+ at baseline to 1-2+ post procedure   Severe mitral regurgitation    Vitamin D deficiency    Past Surgical History:  Procedure Laterality Date   COLONOSCOPY WITH PROPOFOL  N/A 03/17/2017   Procedure: COLONOSCOPY WITH PROPOFOL ;  Surgeon: Rollin Dover, MD;  Location: [facility];  Service: Endoscopy;  Laterality: N/A;   EYE SURGERY     INGUINAL HERNIA REPAIR  1958; ~ 2002   left; right   MULTIPLE EXTRACTIONS WITH ALVEOLOPLASTY N/A 03/18/2021   Procedure: MULTIPLE EXTRACTION WITH ALVEOLOPLASTY;  Surgeon: Celena Lum NOVAK, DMD;  Location: [facility] OR;  Service: Dentistry;  Laterality: N/A;   PACEMAKER IMPLANT N/A 03/11/2019   Procedure: PACEMAKER  IMPLANT;  Surgeon: Inocencio Soyla Lunger, MD;  Location: [facility] INVASIVE CV LAB;  Service: Cardiovascular;  Laterality: N/A;   RIGHT/LEFT HEART CATH AND CORONARY ANGIOGRAPHY N/A 02/24/2021   Procedure: RIGHT/LEFT HEART CATH AND CORONARY ANGIOGRAPHY;  Surgeon: Wonda Sharper, MD;  Location: [facility] INVASIVE CV LAB;  Service: Cardiovascular;  Laterality: N/A;   TEE WITHOUT CARDIOVERSION N/A 12/23/2020   Procedure: TRANSESOPHAGEAL ECHOCARDIOGRAM (TEE);  Surgeon: Hobart Powell BRAVO, MD;  Location: [facility] ENDOSCOPY;  Service: Cardiovascular;  Laterality: N/A;   TEE WITHOUT CARDIOVERSION N/A 03/25/2021   Procedure: TRANSESOPHAGEAL ECHOCARDIOGRAM (TEE);  Surgeon: Wonda Sharper, MD;  Location: [facility] INVASIVE CV LAB;  Service: Cardiovascular;  Laterality: N/A;   TEMPORARY PACEMAKER N/A 03/10/2019   Procedure: TEMPORARY PACEMAKER;  Surgeon: Swaziland, Peter M, MD;  Location: [facility] INVASIVE CV LAB;  Service: Cardiovascular;  Laterality: N/A;   TRANSCATHETER MITRAL EDGE TO EDGE REPAIR N/A 03/25/2021   Procedure: MITRAL VALVE REPAIR;  Surgeon: Wonda Sharper, MD;  Location: [facility] INVASIVE CV LAB;  Service: Cardiovascular;  Laterality: N/A;   Current Outpatient Medications on File Prior to Visit  Medication Sig Dispense Refill   amLODipine  (NORVASC ) 5 MG tablet TAKE 1 TABLET (5 MG TOTAL) BY MOUTH DAILY. 90 tablet 1   carvedilol  (COREG ) 12.5 MG tablet TAKE 1 TABLET BY MOUTH TWICE A DAY 180 tablet 1   donepezil  (ARICEPT ) 5 MG tablet Take 1 tablet (5 mg total) by mouth at bedtime. 90 tablet 1   furosemide  (LASIX ) 40 MG tablet Take 1 tablet (40 mg total) by mouth daily. 100 tablet 0   losartan  (COZAAR ) 50 MG tablet TAKE 1 TABLET BY MOUTH EVERY DAY 90 tablet 1   QUEtiapine  (SEROQUEL  XR) 50 MG TB24 24 hr tablet Take 1 tablet (50 mg total) by mouth at bedtime. 90 tablet 1   spironolactone  (ALDACTONE ) 25 MG tablet TAKE 1 TABLET (25 MG TOTAL) BY MOUTH DAILY. 90 tablet 1   warfarin (COUMADIN ) 5 MG tablet TAKE 1 TABLET (5 MG TOTAL) BY MOUTH  DAILY. (Patient taking differently: Take 5 mg by mouth daily. Takes 1.5 pills on M, W  and Friday. Takes 1 pill every other.) 90 tablet 1   No current facility-administered medications on file prior to visit.   No Known Allergies Social History   Socioeconomic History   Marital status: Widowed    Spouse name: Not on file   Number of children: 1   Years of education: Not on file   Highest education level: Not on file  Occupational History   Occupation: Retired  Tobacco Use   Smoking status: Never   Smokeless tobacco: Former    Types: Snuff   Tobacco comments:    03/17/21 still use snuff daily  Vaping Use   Vaping status: Never Used  Substance and Sexual Activity   Alcohol use: No   Drug use: No   Sexual activity: Never    Comment: widower, lives with granddaughter and great grandchildren.  Other Topics Concern   Not on file  Social History Narrative   Lives with grand daughter.         Epworth Sleepiness Scale = 7 (as of 09/21/2015)   Social Drivers of Health   Financial Resource Strain: Low Risk  (07/20/2023)   Overall Financial Resource Strain (CARDIA)    Difficulty of Paying Living Expenses: Not hard at all  Food Insecurity: No Food Insecurity (07/20/2023)   Hunger Vital Sign    Worried About Running Out of Food in the Last Year: Never true    Ran Out of Food in the Last Year: Never true  Transportation Needs: No Transportation Needs (07/20/2023)   PRAPARE - Administrator, Civil Service (Medical): No    Lack of Transportation (Non-Medical): No  Physical Activity: Inactive (07/20/2023)   Exercise Vital Sign    Days of Exercise per Week: 0 days    Minutes of Exercise per Session: 0 min  Stress: No Stress Concern Present (07/20/2023)   [employer] of Occupational Health - Occupational Stress Questionnaire    Feeling of Stress : Not at all  Social Connections: Socially Isolated (07/20/2023)   Social Connection and Isolation Panel    Frequency of  Communication with Friends and Family: More than three times a week    Frequency of Social Gatherings with Friends and Family: Never    Attends Religious Services: Never    Database administrator or Organizations: No    Attends Engineer, structural: Not on file    Marital Status: Widowed  Intimate Partner Violence: Not At Risk (07/20/2023)   Humiliation, Afraid, Rape, and Kick questionnaire    Fear of Current or Ex-Partner: No    Emotionally Abused: No    Physically Abused: No    Sexually Abused: No      Review of Systems  All other systems reviewed and are negative.      Objective:   Physical Exam Vitals reviewed.  Constitutional:      General: He is not in acute distress.    Appearance: Normal appearance. He is normal weight. He is not ill-appearing, toxic-appearing or diaphoretic.  HENT:     Head: Normocephalic and atraumatic.     Right Ear: Tympanic membrane and ear canal normal.     Left Ear: Tympanic membrane and ear canal normal.     Nose: No congestion or rhinorrhea.     Mouth/Throat:     Mouth: Mucous membranes are moist.     Pharynx: Oropharynx is clear. No oropharyngeal exudate or posterior oropharyngeal erythema.  Neck:     Vascular: No carotid bruit.  Cardiovascular:     Rate and Rhythm: Normal rate. Rhythm irregular.     Heart sounds: Murmur heard.     No friction rub. No gallop.  Pulmonary:     Effort: Pulmonary effort is normal. No respiratory distress.     Breath sounds: Normal breath sounds. No stridor. No wheezing, rhonchi or rales.  Abdominal:     General: Bowel sounds are normal. There is no distension.     Palpations: Abdomen is soft.     Tenderness: There is no abdominal tenderness. There is no right CVA tenderness, guarding or rebound.  Musculoskeletal:  Cervical back: Neck supple. No rigidity.     Right lower leg: No edema.     Left lower leg: No edema.  Lymphadenopathy:     Cervical: No cervical adenopathy.  Skin:    Findings:  No rash.  Neurological:     General: No focal deficit present.     Mental Status: He is alert and oriented to person, place, and time. Mental status is at baseline.     Cranial Nerves: No cranial nerve deficit.     Sensory: No sensory deficit.     Motor: No weakness.     Coordination: Coordination normal.     Gait: Gait normal.     Deep Tendon Reflexes: Reflexes normal.  Psychiatric:        Mood and Affect: Mood normal.        Behavior: Behavior normal.        Thought Content: Thought content normal.        Judgment: Judgment normal.          Assessment & Plan:  Atrial fibrillation, persistent (HCC) - Plan: PT with INR/Fingerstick, Protime-INR  Moderate dementia with psychotic disturbance, unspecified dementia type (HCC) Delusions seem to worsen since adding Aricept .  Discontinue Aricept .  Switch Seroquel  to 50 mg of the extended release twice a day and reassess in 1 week.  We also discussed the possibility of placement in a memory care unit.

## 2024-05-28 ENCOUNTER — Telehealth: Payer: Self-pay | Admitting: Family Medicine

## 2024-05-28 ENCOUNTER — Other Ambulatory Visit: Payer: Self-pay

## 2024-05-28 DIAGNOSIS — J302 Other seasonal allergic rhinitis: Secondary | ICD-10-CM

## 2024-05-28 MED ORDER — FLUTICASONE PROPIONATE 50 MCG/ACT NA SUSP: 1.0000 | Freq: Every day | NASAL | 1 refills | Status: AC

## 2024-05-28 NOTE — Telephone Encounter (Unsigned)
 Copied from CRM [id number]. Topic: Clinical - Medication Refill >> May 28, 2024 12:48 PM Delon T wrote: Medication: fluticasone  (FLONASE ) 50 MCG/ACT nasal spray  Has the patient contacted their pharmacy? No (Agent: If no, request that the patient contact the pharmacy for the refill. If patient does not wish to contact the pharmacy document the reason why and proceed with request.) (Agent: If yes, when and what did the pharmacy advise?)  This is the patient's preferred pharmacy:  [id number] [place], [place] - [address] [place] [address] Phone: [phone] Fax: [phone]  Is this the correct pharmacy for this prescription? Yes If no, delete pharmacy and type the correct one.   Has the prescription been filled recently? Yes  Is the patient out of the medication? Yes  Has the patient been seen for an appointment in the last year OR does the patient have an upcoming appointment? Yes  Can we respond through MyChart? No  Agent: Please be advised that Rx refills may take up to 3 business days. We ask that you follow-up with your pharmacy.

## 2024-05-29 ENCOUNTER — Other Ambulatory Visit: Payer: Self-pay | Admitting: Family Medicine

## 2024-05-29 DIAGNOSIS — R0600 Dyspnea, unspecified: Secondary | ICD-10-CM

## 2024-05-29 NOTE — Telephone Encounter (Unsigned)
 Copied from CRM [id number]. Topic: Clinical - Medication Refill >> May 29, 2024  9:19 AM Selinda RAMAN wrote: Medication: albuterol  (VENTOLIN  HFA) 108 (90 Base) MCG/ACT inhaler  Has the patient contacted their pharmacy? No   This is the patient's preferred pharmacy:  [id number] [place], [place] - [address] [place] [address] Phone: [phone] Fax: [phone]  Is this the correct pharmacy for this prescription? Yes If no, delete pharmacy and type the correct one.   Has the prescription been filled recently? No  Is the patient out of the medication? Yes  Has the patient been seen for an appointment in the last year OR does the patient have an upcoming appointment? Yes  Can we respond through MyChart? No  Please assist patient further. Danielle said he did not need to be triaged right now but that the inhaler really helps when he needs it. It is good for him to have it just in case and it helps to ease his mind.

## 2024-05-29 NOTE — Telephone Encounter (Signed)
 Requested medication (s) are due for refill today: yes  Requested medication (s) are on the active medication list: yes  Last refill:   Future visit scheduled: yes  Notes to clinic:  Unable to refill per protocol, Rx expired. Patient requesting refill, not on current list      Requested Prescriptions  Pending Prescriptions Disp Refills   albuterol  (VENTOLIN  HFA) 108 (90 Base) MCG/ACT inhaler 18 g 3    Sig: INHALE 2 PUFFS EVERY 6 HOURS AS NEEDED FOR WHEEZING OR SHORTNESS OF BREATH     Pulmonology:  Beta Agonists 2 Passed - 05/29/2024  9:30 AM      Passed - Last BP in normal range    BP Readings from Last 1 Encounters:  05/10/24 132/82         Passed - Last Heart Rate in normal range    Pulse Readings from Last 1 Encounters:  05/10/24 72         Passed - Valid encounter within last 12 months    Recent Outpatient Visits           2 weeks ago Atrial fibrillation, persistent (HCC)   [place] [facility] Pickard, Butler DASEN, MD   1 month ago Atrial fibrillation, persistent [facility])   [place] [facility] Pickard, Butler DASEN, MD   2 months ago Memory loss   [place] [facility] Duanne Butler DASEN, MD   1 year ago Atrial fibrillation, persistent [facility])   [place] [facility] Family Medicine Duanne Butler DASEN, MD   1 year ago Chronic systolic congestive heart failure [facility])   [place] [facility] Medicine Pickard, Butler DASEN, MD

## 2024-05-31 NOTE — Telephone Encounter (Signed)
 Copied from CRM #[id number]. Topic: General - Other >> May 31, 2024 12:49 PM Jasmin G wrote: Reason for CRM: Pt's daughter requested a call back regarding Refused Albuterol  Sulfate as she states that he;s having trouble breathing.

## 2024-06-07 ENCOUNTER — Ambulatory Visit: Payer: Self-pay | Admitting: Family Medicine

## 2024-06-07 ENCOUNTER — Other Ambulatory Visit

## 2024-06-07 DIAGNOSIS — I4819 Other persistent atrial fibrillation: Secondary | ICD-10-CM

## 2024-06-07 LAB — PT WITH INR/FINGERSTICK
INR, fingerstick: 2.3 ratio — ABNORMAL HIGH
PT, fingerstick: 27.2 s — ABNORMAL HIGH (ref 10.5–13.1)

## 2024-06-17 ENCOUNTER — Other Ambulatory Visit: Payer: Self-pay | Admitting: Family Medicine

## 2024-06-17 DIAGNOSIS — I10 Essential (primary) hypertension: Secondary | ICD-10-CM

## 2024-06-25 ENCOUNTER — Ambulatory Visit: Payer: Self-pay

## 2024-06-26 LAB — CUP PACEART REMOTE DEVICE CHECK
Battery Remaining Longevity: 76 mo
Battery Remaining Percentage: 59 %
Battery Voltage: 2.99 V
Brady Statistic RV Percent Paced: 94 %
Date Time Interrogation Session: 20251209020014
Implantable Lead Connection Status: 753985
Implantable Lead Implant Date: 20200824
Implantable Lead Location: 753860
Implantable Pulse Generator Implant Date: 20200824
Lead Channel Impedance Value: 410 Ohm
Lead Channel Pacing Threshold Amplitude: 0.875 V
Lead Channel Pacing Threshold Pulse Width: 0.5 ms
Lead Channel Sensing Intrinsic Amplitude: 7.6 mV
Lead Channel Setting Pacing Amplitude: 1.125
Lead Channel Setting Pacing Pulse Width: 0.5 ms
Lead Channel Setting Sensing Sensitivity: 0.7 mV
Pulse Gen Model: 1272
Pulse Gen Serial Number: 9140133

## 2024-06-27 ENCOUNTER — Ambulatory Visit: Payer: Self-pay | Admitting: Cardiology

## 2024-07-02 ENCOUNTER — Ambulatory Visit: Payer: Self-pay

## 2024-07-02 ENCOUNTER — Telehealth: Payer: Self-pay

## 2024-07-02 NOTE — Telephone Encounter (Signed)
 FYI Only or Action Required?: Action required by provider: medication refill request and clinical question for provider.  Patient was last seen in primary care on 05/10/2024 by Duanne Butler DASEN, MD.  Called Nurse Triage reporting Advice Only.  Triage Disposition: Call PCP When Office is Open  Patient/caregiver understands and will follow disposition?: Yes   Copied from CRM #[id number]. Topic: Clinical - Medication Question >> Jul 02, 2024 12:29 PM Shanda MATSU wrote: Reason for CRM: Patient's daughter called in to check status of med refill for med, albuterol , I do not see med on patient's current med list, is req a call back in regards to this, patient's preferred pharmacy is  [id number] [place], [place] - [address], [place] [address] Phone: [phone]  Fax: [phone]    Patient's daughter is also calling in to make sure that correct dosage for med, QUEtiapine  (SEROQUEL  XR) 50 MG TB24 24 hr tablet, is sent to pharmacy, caller stated PCP upped the dosage to twice a day versus once a day so she wants to make sure that the pharmacy has the new prescription for the increased dosage so that way when it is time for her to contact the pharmacy for a refill they are giving her the correct quantity for the dose the patient is now taking. Reason for Disposition  [1] Caller requesting NON-URGENT health information AND [2] PCP's office is the best resource  Answer Assessment - Initial Assessment Questions 1. REASON FOR CALL: What is the main reason for your call? or How can I best help you?    Pt called in stating she had a missed call from the office; stated she was unsure if it was an accident as no message was left. She was f/u on request for refill; didn't know if med was being sent in or if pt would need an appt. Discussed no documentation in chart of her being called. She stated she would wait to hear back on status of  refill. Pt's daughter pleasant and voiced appreciation for our time.  Protocols used: Information Only Call - No Triage-A-AH

## 2024-07-02 NOTE — Telephone Encounter (Signed)
 Copied from CRM #[id number]. Topic: Clinical - Medication Question >> Jul 02, 2024 12:29 PM Shanda MATSU wrote: Reason for CRM: Patient's daughter called in to check status of med refill for med, albuterol , I do not see med on patient's current med list, is req a call back in regards to this, patient's preferred pharmacy is  [id number] [place], [place] - [address], [place] [address] Phone: [phone]  Fax: [phone]    Patient's daughter is also calling in to make sure that correct dosage for med, QUEtiapine  (SEROQUEL  XR) 50 MG TB24 24 hr tablet, is sent to pharmacy, caller stated PCP upped the dosage to twice a day versus once a day so she wants to make sure that the pharmacy has the new prescription for the increased dosage so that way when it is time for her to contact the pharmacy for a refill they are giving her the correct quantity for the dose the patient is now taking.

## 2024-07-02 NOTE — Progress Notes (Signed)
 Remote PPM Transmission

## 2024-07-03 ENCOUNTER — Other Ambulatory Visit: Payer: Self-pay

## 2024-07-03 DIAGNOSIS — R0600 Dyspnea, unspecified: Secondary | ICD-10-CM

## 2024-07-03 MED ORDER — ALBUTEROL SULFATE HFA 108 (90 BASE) MCG/ACT IN AERS: INHALATION_SPRAY | RESPIRATORY_TRACT | 3 refills | Status: AC

## 2024-07-04 ENCOUNTER — Other Ambulatory Visit: Payer: Self-pay | Admitting: Family Medicine

## 2024-07-04 MED ORDER — QUETIAPINE FUMARATE ER 50 MG PO TB24: 50.0000 mg | ORAL_TABLET | Freq: Two times a day (BID) | ORAL | 1 refills | Status: AC

## 2024-07-29 ENCOUNTER — Other Ambulatory Visit

## 2024-07-31 ENCOUNTER — Ambulatory Visit

## 2024-07-31 ENCOUNTER — Ambulatory Visit: Payer: Self-pay

## 2024-07-31 VITALS — Ht 68.0 in | Wt 197.0 lb

## 2024-07-31 DIAGNOSIS — Z0000 Encounter for general adult medical examination without abnormal findings: Secondary | ICD-10-CM | POA: Diagnosis not present

## 2024-07-31 NOTE — Patient Instructions (Signed)
 Austin Gray,  Thank you for taking the time for your Medicare Wellness Visit. I appreciate your continued commitment to your health goals. Please review the care plan we discussed, and feel free to reach out if I can assist you further.  Please note that Annual Wellness Visits do not include a physical exam. Some assessments may be limited, especially if the visit was conducted virtually. If needed, we may recommend an in-person follow-up with your provider.  Ongoing Care Seeing your primary care provider every 3 to 6 months helps us  monitor your health and provide consistent, personalized care.   Referrals If a referral was made during today's visit and you haven't received any updates within two weeks, please contact the referred provider directly to check on the status.  Recommended Screenings:  Health Maintenance  Topic Date Due   Zoster (Shingles) Vaccine (1 of 2) Never done   COVID-19 Vaccine (4 - 2025-26 season) 03/18/2024   Medicare Annual Wellness Visit  07/31/2025   DTaP/Tdap/Td vaccine (4 - Td or Tdap) 04/17/2033   Pneumococcal Vaccine for age over 75  Completed   Flu Shot  Completed   Meningitis B Vaccine  Aged Out       07/31/2024   10:24 AM  Advanced Directives  Does Patient Have a Medical Advance Directive? No  Would patient like information on creating a medical advance directive? Yes (MAU/Ambulatory/Procedural Areas - Information given)   Information on Advanced Care Planning can be found at [employer]  Secretary of [facility] Advance Health Care Directives Advance Health Care Directives ([email or web address]   Vision: Annual vision screenings are recommended for early detection of glaucoma, cataracts, and diabetic retinopathy. These exams can also reveal signs of chronic conditions such as diabetes and high blood pressure.  Dental: Annual dental screenings help detect early signs of oral cancer, gum disease, and other conditions linked to overall health, including heart  disease and diabetes.  Please see the attached documents for additional preventive care recommendations.

## 2024-07-31 NOTE — Progress Notes (Signed)
 "  Chief Complaint  Patient presents with   Medicare Wellness     Subjective:   Austin Gray. is a 89 y.o. male who presents for a Medicare Annual Wellness Visit.  Visit info / Clinical Intake: Medicare Wellness Visit Type:: Subsequent Annual Wellness Visit Persons participating in visit and providing information:: patient Medicare Wellness Visit Mode:: Telephone If telephone:: video declined Since this visit was completed virtually, some vitals may be partially provided or unavailable. Missing vitals are due to the limitations of the virtual format.: Documented vitals are patient reported If Telephone or Video please confirm:: I connected with patient using audio/video enable telemedicine. I verified patient identity with two identifiers, discussed telehealth limitations, and patient agreed to proceed. Patient Location:: home Provider Location:: office Interpreter Needed?: No Pre-visit prep was completed: yes AWV questionnaire completed by patient prior to visit?: no Living arrangements:: with family/others Patient's Overall Health Status Rating: good Typical amount of pain: some Does pain affect daily life?: no Are you currently prescribed opioids?: no  Dietary Habits and Nutritional Risks How many meals a day?: 3 Eats fruit and vegetables daily?: yes Most meals are obtained by: having others provide food In the last 2 weeks, have you had any of the following?: none Diabetic:: no  Functional Status Activities of Daily Living (to include ambulation/medication): Independent Ambulation: Independent with device- listed below Home Assistive Devices/Equipment: Cane Medication Administration: Needs assistance (comment) Is this a change from baseline?: Pre-admission baseline Home Management (perform basic housework or laundry): Needs assistance (comment) Manage your own finances?: (!) no Primary transportation is: family / friends Concerns about vision?: no *vision  screening is required for WTM* Concerns about hearing?: no  Fall Screening Falls in the past year?: 0 Number of falls in past year: 0 Was there an injury with Fall?: 0 Fall Risk Category Calculator: 0 Patient Fall Risk Level: Low Fall Risk  Fall Risk Patient at Risk for Falls Due to: Impaired mobility Fall risk Follow up: Education provided; Falls prevention discussed; Falls evaluation completed  Home and Transportation Safety: All rugs have non-skid backing?: yes All stairs or steps have railings?: yes Grab bars in the bathtub or shower?: yes Have non-skid surface in bathtub or shower?: yes Good home lighting?: yes Regular seat belt use?: yes Hospital stays in the last year:: no  Cognitive Assessment Difficulty concentrating, remembering, or making decisions? : yes Will 6CIT or Mini Cog be Completed: no 6CIT or Mini Cog Declined: patient has a diagnosis of dementia or cognitive impairment  Advance Directives (For Healthcare) Does Patient Have a Medical Advance Directive?: No Would patient like information on creating a medical advance directive?: Yes (MAU/Ambulatory/Procedural Areas - Information given)  Reviewed/Updated  Reviewed/Updated: Reviewed All (Medical, Surgical, Family, Medications, Allergies, Care Teams, Patient Goals)    Allergies (verified) Patient has no known allergies.   Current Medications (verified) Outpatient Encounter Medications as of 07/31/2024  Medication Sig   albuterol  (VENTOLIN  HFA) 108 (90 Base) MCG/ACT inhaler INHALE 2 PUFFS EVERY 6 HOURS AS NEEDED FOR WHEEZING OR SHORTNESS OF BREATH   amLODipine  (NORVASC ) 5 MG tablet TAKE 1 TABLET (5 MG TOTAL) BY MOUTH DAILY.   carvedilol  (COREG ) 12.5 MG tablet TAKE 1 TABLET BY MOUTH TWICE A DAY   donepezil  (ARICEPT ) 5 MG tablet Take 1 tablet (5 mg total) by mouth at bedtime.   fluticasone  (FLONASE ) 50 MCG/ACT nasal spray Place 1 spray into both nostrils daily.   furosemide  (LASIX ) 40 MG tablet Take 1  tablet (40 mg total)  by mouth daily.   losartan  (COZAAR ) 50 MG tablet TAKE 1 TABLET BY MOUTH EVERY DAY   QUEtiapine  (SEROQUEL  XR) 50 MG TB24 24 hr tablet Take 1 tablet (50 mg total) by mouth in the morning and at bedtime.   spironolactone  (ALDACTONE ) 25 MG tablet TAKE 1 TABLET (25 MG TOTAL) BY MOUTH DAILY.   warfarin (COUMADIN ) 5 MG tablet TAKE 1 TABLET (5 MG TOTAL) BY MOUTH DAILY. (Patient taking differently: Take 5 mg by mouth daily. Takes 1.5 pills on M, W  and Friday. Takes 1 pill every other.)   No facility-administered encounter medications on file as of 07/31/2024.    History: Past Medical History:  Diagnosis Date   Anxiety    Atrial fibrillation (HCC)    Chronic   Cataracts, bilateral    to have left eye cataract surgery on 03/21/17    CHF (congestive heart failure), NYHA class II, chronic, diastolic (HCC)    Colon polyps    last colonoscopy 2015 (tubular adenoma)   Depression    GERD (gastroesophageal reflux disease)    H/O: GI bleed    Recurrent   Hemorrhoids    History of BPH    History of kidney stones    hx of x 1  passed stones   Hypertension    Pernicious anemia    Presence of permanent cardiac pacemaker    PVC (premature ventricular contraction)    S/P mitral valve clip implantation 03/25/2021   Successful transcatheter edge-to-edge mitral valve repair with placement of a MitraClip G4 XTW clip positioned A2/P2 and placement of a MitraClip G4 NTW clip positioned on the lateral side of A2/P2, reducing mitral regurgitation from 4+ at baseline to 1-2+ post procedure   Severe mitral regurgitation    Vitamin D deficiency    Past Surgical History:  Procedure Laterality Date   COLONOSCOPY WITH PROPOFOL  N/A 03/17/2017   Procedure: COLONOSCOPY WITH PROPOFOL ;  Surgeon: Rollin Dover, MD;  Location: [facility];  Service: Endoscopy;  Laterality: N/A;   EYE SURGERY     INGUINAL HERNIA REPAIR  1958; ~ 2002   left; right   MULTIPLE EXTRACTIONS WITH ALVEOLOPLASTY N/A  03/18/2021   Procedure: MULTIPLE EXTRACTION WITH ALVEOLOPLASTY;  Surgeon: Celena Lum NOVAK, DMD;  Location: [facility] OR;  Service: Dentistry;  Laterality: N/A;   PACEMAKER IMPLANT N/A 03/11/2019   Procedure: PACEMAKER IMPLANT;  Surgeon: Inocencio Soyla Lunger, MD;  Location: [facility] INVASIVE CV LAB;  Service: Cardiovascular;  Laterality: N/A;   RIGHT/LEFT HEART CATH AND CORONARY ANGIOGRAPHY N/A 02/24/2021   Procedure: RIGHT/LEFT HEART CATH AND CORONARY ANGIOGRAPHY;  Surgeon: Wonda Sharper, MD;  Location[facility] INVASIVE CV LAB;  Service: Cardiovascular;  Laterality: N/A;   TEE WITHOUT CARDIOVERSION N/A 12/23/2020   Procedure: TRANSESOPHAGEAL ECHOCARDIOGRAM (TEE);  Surgeon: Hobart Powell BRAVO, MD;  Location: [facility] ENDOSCOPY;  Service: Cardiovascular;  Laterality: N/A;   TEE WITHOUT CARDIOVERSION N/A 03/25/2021   Procedure: TRANSESOPHAGEAL ECHOCARDIOGRAM (TEE);  Surgeon: Wonda Sharper, MD;  Location: [facility] INVASIVE CV LAB;  Service: Cardiovascular;  Laterality: N/A;   TEMPORARY PACEMAKER N/A 03/10/2019   Procedure: TEMPORARY PACEMAKER;  Surgeon: Jordan, Peter M, MD;  Location: [facility] INVASIVE CV LAB;  Service: Cardiovascular;  Laterality: N/A;   TRANSCATHETER MITRAL EDGE TO EDGE REPAIR N/A 03/25/2021   Procedure: MITRAL VALVE REPAIR;  Surgeon: Wonda Sharper, MD;  Location: [facility] INVASIVE CV LAB;  Service: Cardiovascular;  Laterality: N/A;   Family History  Problem Relation Age of Onset   Stroke Father 84   Cancer Mother  Social History   Occupational History   Occupation: Retired  Tobacco Use   Smoking status: Never   Smokeless tobacco: Former    Types: Snuff   Tobacco comments:    03/17/21 still use snuff daily  Vaping Use   Vaping status: Never Used  Substance and Sexual Activity   Alcohol use: No   Drug use: No   Sexual activity: Never    Comment: widower, lives with granddaughter and great grandchildren.   Tobacco Counseling Counseling given: Not Answered Tobacco comments: 03/17/21 still use  snuff daily  SDOH Screenings   Food Insecurity: No Food Insecurity (07/31/2024)  Housing: Low Risk (07/31/2024)  Transportation Needs: No Transportation Needs (07/31/2024)  Utilities: Not At Risk (07/31/2024)  Alcohol Screen: Low Risk (07/20/2023)  Depression (PHQ2-9): Low Risk (07/31/2024)  Financial Resource Strain: Low Risk (07/20/2023)  Physical Activity: Inactive (07/31/2024)  Social Connections: Socially Isolated (07/31/2024)  Stress: No Stress Concern Present (07/31/2024)  Tobacco Use: Medium Risk (07/31/2024)  Health Literacy: Inadequate Health Literacy (07/31/2024)   See flowsheets for full screening details  Depression Screen PHQ 2 & 9 Depression Scale- Over the past 2 weeks, how often have you been bothered by any of the following problems? Little interest or pleasure in doing things: 0 Feeling down, depressed, or hopeless (PHQ Adolescent also includes...irritable): 0 PHQ-2 Total Score: 0     Goals Addressed             This Visit's Progress    Patient Stated   On track    Continue current lifestyle     Prevent falls               Objective:    Today's Vitals   07/31/24 1022  Weight: 197 lb (89.4 kg)  Height: 5' 8 (1.727 m)   Body mass index is 29.95 kg/m.  Hearing/Vision screen No results found. Immunizations and Health Maintenance Health Maintenance  Topic Date Due   Zoster Vaccines- Shingrix (1 of 2) Never done   COVID-19 Vaccine (4 - 2025-26 season) 03/18/2024   Medicare Annual Wellness (AWV)  07/31/2025   DTaP/Tdap/Td (4 - Td or Tdap) 04/17/2033   Pneumococcal Vaccine: 50+ Years  Completed   Influenza Vaccine  Completed   Meningococcal B Vaccine  Aged Out        Assessment/Plan:  This is a routine wellness examination for Austin Gray.  Patient Care Team: Duanne Butler DASEN, MD as PCP - General (Family Medicine) Inocencio Soyla Lunger, MD as PCP - Electrophysiology (Cardiology) Lavona Agent, MD as PCP - Cardiology (Cardiology) Nicholaus Sherlean CROME, [facility] (Inactive) as Pharmacist (Pharmacist) Daneen Damien BROCKS, NP as Nurse Practitioner (Cardiology)  I have personally reviewed and noted the following in the patients chart:   Medical and social history Use of alcohol, tobacco or illicit drugs  Current medications and supplements including opioid prescriptions. Functional ability and status Nutritional status Physical activity Advanced directives List of other physicians Hospitalizations, surgeries, and ER visits in previous 12 months Vitals Screenings to include cognitive, depression, and falls Referrals and appointments  No orders of the defined types were placed in this encounter.  In addition, I have reviewed and discussed with patient certain preventive protocols, quality metrics, and best practice recommendations. A written personalized care plan for preventive services as well as general preventive health recommendations were provided to patient.   Lavelle Charmaine Browner, LPN   8/85/7973   Return in 1 year (on 07/31/2025).  After Visit Summary: (Mail) Due to this being  a telephonic visit, the after visit summary with patients personalized plan was offered to patient via mail   Nurse Notes: Patient advised to keep follow-up appointment with PCP (for labs 08/06/24)  "

## 2024-07-31 NOTE — Telephone Encounter (Signed)
 FYI Only or Action Required?: Action required by provider: update on patient condition.  Patient was last seen in primary care on 05/10/2024 by Duanne Butler DASEN, MD.  Called Nurse Triage reporting Shortness of Breath.  Symptoms began yesterday.  Interventions attempted: Nothing.  Symptoms are: completely resolved.  Triage Disposition: Call PCP Within 24 Hours  Patient/caregiver understands and will follow disposition?: Yes             Copied from CRM [id number]. Topic: Clinical - Red Word Triage >> Jul 31, 2024  8:25 AM Rosaria BRAVO wrote: Red Word that prompted transfer to Nurse Triage: Edsel Hacker [facility]) reports that the patient had shortness of breath and difficulty breathing last night. Says he was fine after a few minutes. Reason for Disposition  [1] Caller requests to speak ONLY to PCP AND [2] NON-URGENT question  Answer Assessment - Initial Assessment Questions 1. REASON FOR CALL or QUESTION: What is your reason for calling today? or How can I best      Patients daughter called into triage with reports of SOB in patient last night. Daughter states he was not in distress, but had a rag over his face. She then stated the patient woke her up, stating he cannot breathe. She stated he was possibly confused. No current distress noted, daughter stated he is fine. She just wanted the provider to be aware, as the patient has a wellness visit today, that she will not be able to attend with the patient.  Protocols used: PCP Call - No Triage-A-AH

## 2024-08-06 ENCOUNTER — Other Ambulatory Visit

## 2024-08-06 ENCOUNTER — Ambulatory Visit: Payer: Self-pay | Admitting: Family Medicine

## 2024-08-06 ENCOUNTER — Telehealth: Payer: Self-pay

## 2024-08-06 DIAGNOSIS — I4819 Other persistent atrial fibrillation: Secondary | ICD-10-CM

## 2024-08-06 LAB — PT WITH INR/FINGERSTICK
INR, fingerstick: 2.3 ratio — ABNORMAL HIGH
PT, fingerstick: 27.7 s — ABNORMAL HIGH (ref 10.5–13.1)

## 2024-08-06 NOTE — Telephone Encounter (Signed)
 Copied from CRM #[id number]. Topic: Clinical - Lab/Test Results >> Aug 06, 2024  4:40 PM Travis F wrote: Reason for CRM: Patient is calling in returning a call from the office regarding his lab results. Results given to patient.

## 2024-08-22 ENCOUNTER — Other Ambulatory Visit: Payer: Self-pay | Admitting: Family Medicine

## 2024-08-22 DIAGNOSIS — I10 Essential (primary) hypertension: Secondary | ICD-10-CM

## 2024-08-22 DIAGNOSIS — I4819 Other persistent atrial fibrillation: Secondary | ICD-10-CM

## 2024-09-24 ENCOUNTER — Ambulatory Visit

## 2024-10-25 ENCOUNTER — Ambulatory Visit

## 2024-11-25 ENCOUNTER — Ambulatory Visit

## 2024-12-26 ENCOUNTER — Ambulatory Visit

## 2025-01-26 ENCOUNTER — Ambulatory Visit
